# Patient Record
Sex: Male | Born: 1935
Health system: Southern US, Community
[De-identification: ages and names within clinical notes are randomized; demographics above are authoritative.]

## PROBLEM LIST (undated history)

## (undated) ENCOUNTER — Emergency Department: Admission: EM | Payer: Medicare Other | Source: Home / Self Care

## (undated) DIAGNOSIS — G4700 Insomnia, unspecified: Secondary | ICD-10-CM

## (undated) DIAGNOSIS — M1990 Unspecified osteoarthritis, unspecified site: Secondary | ICD-10-CM

## (undated) DIAGNOSIS — G992 Myelopathy in diseases classified elsewhere: Secondary | ICD-10-CM

## (undated) DIAGNOSIS — J189 Pneumonia, unspecified organism: Secondary | ICD-10-CM

## (undated) DIAGNOSIS — R002 Palpitations: Secondary | ICD-10-CM

## (undated) DIAGNOSIS — T4145XA Adverse effect of unspecified anesthetic, initial encounter: Secondary | ICD-10-CM

## (undated) DIAGNOSIS — R202 Paresthesia of skin: Secondary | ICD-10-CM

## (undated) DIAGNOSIS — Z951 Presence of aortocoronary bypass graft: Secondary | ICD-10-CM

## (undated) DIAGNOSIS — T8859XA Other complications of anesthesia, initial encounter: Secondary | ICD-10-CM

## (undated) DIAGNOSIS — Z9861 Coronary angioplasty status: Secondary | ICD-10-CM

## (undated) DIAGNOSIS — R222 Localized swelling, mass and lump, trunk: Secondary | ICD-10-CM

## (undated) DIAGNOSIS — I25810 Atherosclerosis of coronary artery bypass graft(s) without angina pectoris: Secondary | ICD-10-CM

## (undated) DIAGNOSIS — M4802 Spinal stenosis, cervical region: Secondary | ICD-10-CM

## (undated) DIAGNOSIS — I10 Essential (primary) hypertension: Secondary | ICD-10-CM

## (undated) DIAGNOSIS — I2510 Atherosclerotic heart disease of native coronary artery without angina pectoris: Secondary | ICD-10-CM

## (undated) DIAGNOSIS — Z87442 Personal history of urinary calculi: Secondary | ICD-10-CM

## (undated) DIAGNOSIS — I499 Cardiac arrhythmia, unspecified: Secondary | ICD-10-CM

## (undated) DIAGNOSIS — T884XXA Failed or difficult intubation, initial encounter: Secondary | ICD-10-CM

## (undated) DIAGNOSIS — E785 Hyperlipidemia, unspecified: Secondary | ICD-10-CM

## (undated) HISTORY — DX: Atherosclerosis of coronary artery bypass graft(s) without angina pectoris: I25.810

## (undated) HISTORY — PX: CORONARY ANGIOPLASTY WITH STENT PLACEMENT: SHX49

## (undated) HISTORY — PX: BACK SURGERY: SHX140

## (undated) HISTORY — PX: CORONARY ARTERY BYPASS GRAFT: SHX141

## (undated) HISTORY — DX: Palpitations: R00.2

## (undated) HISTORY — PX: COLONOSCOPY: SHX5424

## (undated) HISTORY — PX: KNEE ARTHROSCOPY: SUR90

## (undated) HISTORY — PX: NM MYOVIEW LTD: HXRAD82

## (undated) HISTORY — DX: Insomnia, unspecified: G47.00

## (undated) HISTORY — DX: Atherosclerotic heart disease of native coronary artery without angina pectoris: I25.10

## (undated) HISTORY — DX: Essential (primary) hypertension: I10

## (undated) HISTORY — PX: KIDNEY STONE SURGERY: SHX686

## (undated) HISTORY — DX: Hyperlipidemia, unspecified: E78.5

## (undated) HISTORY — PX: EYE SURGERY: SHX253

## (undated) HISTORY — DX: Coronary angioplasty status: Z98.61

## (undated) HISTORY — DX: Presence of aortocoronary bypass graft: Z95.1

---

## 1995-06-08 DIAGNOSIS — Z951 Presence of aortocoronary bypass graft: Secondary | ICD-10-CM

## 1995-06-08 HISTORY — DX: Presence of aortocoronary bypass graft: Z95.1

## 1995-07-27 DIAGNOSIS — I25118 Atherosclerotic heart disease of native coronary artery with other forms of angina pectoris: Secondary | ICD-10-CM | POA: Insufficient documentation

## 1998-05-04 ENCOUNTER — Emergency Department (HOSPITAL_COMMUNITY): Admission: EM | Admit: 1998-05-04 | Discharge: 1998-05-04 | Payer: Self-pay | Admitting: Emergency Medicine

## 1999-04-02 ENCOUNTER — Encounter: Payer: Self-pay | Admitting: Gastroenterology

## 1999-04-02 ENCOUNTER — Ambulatory Visit (HOSPITAL_COMMUNITY): Admission: RE | Admit: 1999-04-02 | Discharge: 1999-04-02 | Payer: Self-pay | Admitting: Gastroenterology

## 2000-07-11 ENCOUNTER — Ambulatory Visit (HOSPITAL_COMMUNITY): Admission: RE | Admit: 2000-07-11 | Discharge: 2000-07-11 | Payer: Self-pay | Admitting: Gastroenterology

## 2000-08-04 ENCOUNTER — Ambulatory Visit (HOSPITAL_COMMUNITY): Admission: RE | Admit: 2000-08-04 | Discharge: 2000-08-04 | Payer: Self-pay | Admitting: Gastroenterology

## 2000-08-04 ENCOUNTER — Encounter: Payer: Self-pay | Admitting: Gastroenterology

## 2001-07-25 ENCOUNTER — Ambulatory Visit (HOSPITAL_COMMUNITY): Admission: RE | Admit: 2001-07-25 | Discharge: 2001-07-25 | Payer: Self-pay | Admitting: Oncology

## 2001-07-25 ENCOUNTER — Encounter (HOSPITAL_COMMUNITY): Payer: Self-pay | Admitting: Oncology

## 2002-03-08 ENCOUNTER — Encounter: Payer: Self-pay | Admitting: Internal Medicine

## 2002-03-08 ENCOUNTER — Ambulatory Visit (HOSPITAL_COMMUNITY): Admission: RE | Admit: 2002-03-08 | Discharge: 2002-03-08 | Payer: Self-pay

## 2004-04-12 ENCOUNTER — Ambulatory Visit: Payer: Self-pay | Admitting: Oncology

## 2004-06-20 ENCOUNTER — Encounter: Admission: RE | Admit: 2004-06-20 | Discharge: 2004-06-20 | Payer: Self-pay | Admitting: Rheumatology

## 2004-06-22 ENCOUNTER — Ambulatory Visit (HOSPITAL_COMMUNITY): Admission: RE | Admit: 2004-06-22 | Discharge: 2004-06-22 | Payer: Self-pay | Admitting: Rheumatology

## 2004-07-01 ENCOUNTER — Encounter: Admission: RE | Admit: 2004-07-01 | Discharge: 2004-07-01 | Payer: Self-pay | Admitting: Gastroenterology

## 2004-07-09 ENCOUNTER — Encounter (INDEPENDENT_AMBULATORY_CARE_PROVIDER_SITE_OTHER): Payer: Self-pay | Admitting: Specialist

## 2004-07-09 ENCOUNTER — Ambulatory Visit (HOSPITAL_COMMUNITY): Admission: RE | Admit: 2004-07-09 | Discharge: 2004-07-09 | Payer: Self-pay | Admitting: Gastroenterology

## 2004-08-24 ENCOUNTER — Encounter: Admission: RE | Admit: 2004-08-24 | Discharge: 2004-08-24 | Payer: Self-pay | Admitting: Gastroenterology

## 2004-09-14 ENCOUNTER — Encounter: Admission: RE | Admit: 2004-09-14 | Discharge: 2004-09-14 | Payer: Self-pay | Admitting: Gastroenterology

## 2004-11-04 ENCOUNTER — Ambulatory Visit (HOSPITAL_COMMUNITY): Admission: RE | Admit: 2004-11-04 | Discharge: 2004-11-05 | Payer: Self-pay | Admitting: Cardiology

## 2005-07-08 ENCOUNTER — Ambulatory Visit: Payer: Self-pay | Admitting: Oncology

## 2005-10-20 ENCOUNTER — Ambulatory Visit (HOSPITAL_COMMUNITY): Admission: RE | Admit: 2005-10-20 | Discharge: 2005-10-20 | Payer: Self-pay | Admitting: Specialist

## 2006-01-05 ENCOUNTER — Ambulatory Visit: Payer: Self-pay | Admitting: Oncology

## 2006-01-07 LAB — IGG, IGA, IGM
IgA: 161 mg/dL (ref 68–378)
IgG (Immunoglobin G), Serum: 1330 mg/dL (ref 694–1618)
IgM, Serum: 93 mg/dL (ref 60–263)

## 2006-02-11 ENCOUNTER — Emergency Department (HOSPITAL_COMMUNITY): Admission: EM | Admit: 2006-02-11 | Discharge: 2006-02-11 | Payer: Self-pay | Admitting: Emergency Medicine

## 2006-02-28 ENCOUNTER — Emergency Department (HOSPITAL_COMMUNITY): Admission: EM | Admit: 2006-02-28 | Discharge: 2006-03-01 | Payer: Self-pay | Admitting: Emergency Medicine

## 2006-03-02 ENCOUNTER — Ambulatory Visit (HOSPITAL_BASED_OUTPATIENT_CLINIC_OR_DEPARTMENT_OTHER): Admission: RE | Admit: 2006-03-02 | Discharge: 2006-03-02 | Payer: Self-pay | Admitting: Urology

## 2006-03-04 ENCOUNTER — Emergency Department (HOSPITAL_COMMUNITY): Admission: EM | Admit: 2006-03-04 | Discharge: 2006-03-05 | Payer: Self-pay | Admitting: *Deleted

## 2006-07-05 ENCOUNTER — Ambulatory Visit: Payer: Self-pay | Admitting: Oncology

## 2006-07-08 LAB — CBC WITH DIFFERENTIAL/PLATELET
BASO%: 0.3 % (ref 0.0–2.0)
Basophils Absolute: 0 10*3/uL (ref 0.0–0.1)
EOS%: 0 % (ref 0.0–7.0)
Eosinophils Absolute: 0 10*3/uL (ref 0.0–0.5)
HCT: 44.5 % (ref 38.7–49.9)
HGB: 15.4 g/dL (ref 13.0–17.1)
LYMPH%: 22 % (ref 14.0–48.0)
MCH: 29.3 pg (ref 28.0–33.4)
MCHC: 34.6 g/dL (ref 32.0–35.9)
MCV: 84.7 fL (ref 81.6–98.0)
MONO#: 0.5 10*3/uL (ref 0.1–0.9)
MONO%: 5.5 % (ref 0.0–13.0)
NEUT#: 7.2 10*3/uL — ABNORMAL HIGH (ref 1.5–6.5)
NEUT%: 72.2 % (ref 40.0–75.0)
Platelets: 294 10*3/uL (ref 145–400)
RBC: 5.25 10*6/uL (ref 4.20–5.71)
RDW: 13.5 % (ref 11.2–14.6)
WBC: 10 10*3/uL (ref 4.0–10.0)
lymph#: 2.2 10*3/uL (ref 0.9–3.3)

## 2006-07-08 LAB — COMPREHENSIVE METABOLIC PANEL
ALT: 11 U/L (ref 0–53)
AST: 13 U/L (ref 0–37)
Albumin: 4.2 g/dL (ref 3.5–5.2)
Alkaline Phosphatase: 99 U/L (ref 39–117)
BUN: 20 mg/dL (ref 6–23)
CO2: 28 mEq/L (ref 19–32)
Calcium: 9.9 mg/dL (ref 8.4–10.5)
Chloride: 106 mEq/L (ref 96–112)
Creatinine, Ser: 0.9 mg/dL (ref 0.40–1.50)
Glucose, Bld: 119 mg/dL — ABNORMAL HIGH (ref 70–99)
Potassium: 3.9 mEq/L (ref 3.5–5.3)
Sodium: 144 mEq/L (ref 135–145)
Total Bilirubin: 0.6 mg/dL (ref 0.3–1.2)
Total Protein: 7.3 g/dL (ref 6.0–8.3)

## 2006-07-08 LAB — IGG, IGA, IGM
IgA: 164 mg/dL (ref 68–378)
IgG (Immunoglobin G), Serum: 1470 mg/dL (ref 694–1618)
IgM, Serum: 92 mg/dL (ref 60–263)

## 2006-07-08 LAB — LACTATE DEHYDROGENASE: LDH: 124 U/L (ref 94–250)

## 2006-09-28 ENCOUNTER — Encounter: Admission: RE | Admit: 2006-09-28 | Discharge: 2006-09-28 | Payer: Self-pay | Admitting: Specialist

## 2006-10-19 ENCOUNTER — Encounter: Admission: RE | Admit: 2006-10-19 | Discharge: 2006-10-19 | Payer: Self-pay | Admitting: Specialist

## 2006-11-06 DIAGNOSIS — I2510 Atherosclerotic heart disease of native coronary artery without angina pectoris: Secondary | ICD-10-CM

## 2006-11-06 HISTORY — PX: CARDIAC CATHETERIZATION: SHX172

## 2006-11-06 HISTORY — PX: LEFT HEART CATH AND CORS/GRAFTS ANGIOGRAPHY: CATH118250

## 2006-11-06 HISTORY — DX: Atherosclerotic heart disease of native coronary artery without angina pectoris: I25.10

## 2006-11-06 HISTORY — PX: CORONARY ANGIOPLASTY WITH STENT PLACEMENT: SHX49

## 2006-11-10 ENCOUNTER — Encounter: Admission: RE | Admit: 2006-11-10 | Discharge: 2006-11-10 | Payer: Self-pay | Admitting: Specialist

## 2006-11-12 ENCOUNTER — Emergency Department (HOSPITAL_COMMUNITY): Admission: EM | Admit: 2006-11-12 | Discharge: 2006-11-12 | Payer: Self-pay | Admitting: Emergency Medicine

## 2006-11-13 ENCOUNTER — Inpatient Hospital Stay (HOSPITAL_COMMUNITY): Admission: EM | Admit: 2006-11-13 | Discharge: 2006-11-15 | Payer: Self-pay | Admitting: Emergency Medicine

## 2007-01-09 ENCOUNTER — Encounter: Admission: RE | Admit: 2007-01-09 | Discharge: 2007-01-09 | Payer: Self-pay | Admitting: Specialist

## 2007-04-05 ENCOUNTER — Encounter: Admission: RE | Admit: 2007-04-05 | Discharge: 2007-04-05 | Payer: Self-pay | Admitting: Specialist

## 2007-04-20 ENCOUNTER — Encounter: Admission: RE | Admit: 2007-04-20 | Discharge: 2007-04-20 | Payer: Self-pay | Admitting: Specialist

## 2007-07-05 ENCOUNTER — Ambulatory Visit: Payer: Self-pay | Admitting: Oncology

## 2007-07-07 LAB — CBC WITH DIFFERENTIAL/PLATELET
BASO%: 0.6 % (ref 0.0–2.0)
Basophils Absolute: 0 10*3/uL (ref 0.0–0.1)
EOS%: 1.3 % (ref 0.0–7.0)
Eosinophils Absolute: 0.1 10*3/uL (ref 0.0–0.5)
HCT: 41.9 % (ref 38.7–49.9)
HGB: 14.6 g/dL (ref 13.0–17.1)
LYMPH%: 30.8 % (ref 14.0–48.0)
MCH: 30.3 pg (ref 28.0–33.4)
MCHC: 35 g/dL (ref 32.0–35.9)
MCV: 86.5 fL (ref 81.6–98.0)
MONO#: 0.5 10*3/uL (ref 0.1–0.9)
MONO%: 8.7 % (ref 0.0–13.0)
NEUT#: 3.2 10*3/uL (ref 1.5–6.5)
NEUT%: 58.6 % (ref 40.0–75.0)
Platelets: 251 10*3/uL (ref 145–400)
RBC: 4.84 10*6/uL (ref 4.20–5.71)
RDW: 14 % (ref 11.2–14.6)
WBC: 5.4 10*3/uL (ref 4.0–10.0)
lymph#: 1.7 10*3/uL (ref 0.9–3.3)

## 2007-07-10 ENCOUNTER — Encounter: Admission: RE | Admit: 2007-07-10 | Discharge: 2007-07-10 | Payer: Self-pay | Admitting: Specialist

## 2007-07-10 LAB — COMPREHENSIVE METABOLIC PANEL
ALT: 12 U/L (ref 0–53)
AST: 15 U/L (ref 0–37)
Albumin: 4.1 g/dL (ref 3.5–5.2)
Alkaline Phosphatase: 79 U/L (ref 39–117)
BUN: 20 mg/dL (ref 6–23)
CO2: 27 mEq/L (ref 19–32)
Calcium: 9.4 mg/dL (ref 8.4–10.5)
Chloride: 106 mEq/L (ref 96–112)
Creatinine, Ser: 0.87 mg/dL (ref 0.40–1.50)
Glucose, Bld: 107 mg/dL — ABNORMAL HIGH (ref 70–99)
Potassium: 4.5 mEq/L (ref 3.5–5.3)
Sodium: 143 mEq/L (ref 135–145)
Total Bilirubin: 0.9 mg/dL (ref 0.3–1.2)
Total Protein: 6.9 g/dL (ref 6.0–8.3)

## 2007-07-10 LAB — IGG, IGA, IGM
IgA: 117 mg/dL (ref 68–378)
IgG (Immunoglobin G), Serum: 1170 mg/dL (ref 694–1618)
IgM, Serum: 84 mg/dL (ref 60–263)

## 2007-07-10 LAB — LACTATE DEHYDROGENASE: LDH: 126 U/L (ref 94–250)

## 2007-07-10 LAB — BETA 2 MICROGLOBULIN, SERUM: Beta-2 Microglobulin: 2.14 mg/L — ABNORMAL HIGH (ref 1.01–1.73)

## 2007-12-21 ENCOUNTER — Encounter (INDEPENDENT_AMBULATORY_CARE_PROVIDER_SITE_OTHER): Payer: Self-pay | Admitting: Interventional Radiology

## 2007-12-21 ENCOUNTER — Other Ambulatory Visit: Admission: RE | Admit: 2007-12-21 | Discharge: 2007-12-21 | Payer: Self-pay | Admitting: Interventional Radiology

## 2007-12-21 ENCOUNTER — Encounter: Admission: RE | Admit: 2007-12-21 | Discharge: 2007-12-21 | Payer: Self-pay | Admitting: Internal Medicine

## 2008-06-07 DIAGNOSIS — I25810 Atherosclerosis of coronary artery bypass graft(s) without angina pectoris: Secondary | ICD-10-CM

## 2008-06-07 HISTORY — PX: LEFT HEART CATH AND CORS/GRAFTS ANGIOGRAPHY: CATH118250

## 2008-06-07 HISTORY — PX: CARDIAC CATHETERIZATION: SHX172

## 2008-06-07 HISTORY — DX: Atherosclerosis of coronary artery bypass graft(s) without angina pectoris: I25.810

## 2008-07-05 ENCOUNTER — Ambulatory Visit: Payer: Self-pay | Admitting: Oncology

## 2008-07-09 LAB — COMPREHENSIVE METABOLIC PANEL
ALT: 13 U/L (ref 0–53)
AST: 12 U/L (ref 0–37)
Albumin: 3.8 g/dL (ref 3.5–5.2)
Alkaline Phosphatase: 74 U/L (ref 39–117)
BUN: 22 mg/dL (ref 6–23)
CO2: 29 mEq/L (ref 19–32)
Calcium: 9.5 mg/dL (ref 8.4–10.5)
Chloride: 104 mEq/L (ref 96–112)
Creatinine, Ser: 0.88 mg/dL (ref 0.40–1.50)
Glucose, Bld: 100 mg/dL — ABNORMAL HIGH (ref 70–99)
Potassium: 4.4 mEq/L (ref 3.5–5.3)
Sodium: 141 mEq/L (ref 135–145)
Total Bilirubin: 0.7 mg/dL (ref 0.3–1.2)
Total Protein: 6.7 g/dL (ref 6.0–8.3)

## 2008-07-09 LAB — CBC WITH DIFFERENTIAL/PLATELET
BASO%: 0.4 % (ref 0.0–2.0)
Basophils Absolute: 0 10*3/uL (ref 0.0–0.1)
EOS%: 0.8 % (ref 0.0–7.0)
Eosinophils Absolute: 0.1 10*3/uL (ref 0.0–0.5)
HCT: 44.3 % (ref 38.7–49.9)
HGB: 15.1 g/dL (ref 13.0–17.1)
LYMPH%: 30.1 % (ref 14.0–48.0)
MCH: 29.7 pg (ref 28.0–33.4)
MCHC: 34.1 g/dL (ref 32.0–35.9)
MCV: 87.2 fL (ref 81.6–98.0)
MONO#: 0.6 10*3/uL (ref 0.1–0.9)
MONO%: 8.8 % (ref 0.0–13.0)
NEUT#: 3.9 10*3/uL (ref 1.5–6.5)
NEUT%: 59.9 % (ref 40.0–75.0)
Platelets: 235 10*3/uL (ref 145–400)
RBC: 5.08 10*6/uL (ref 4.20–5.71)
RDW: 14.4 % (ref 11.2–14.6)
WBC: 6.5 10*3/uL (ref 4.0–10.0)
lymph#: 2 10*3/uL (ref 0.9–3.3)

## 2008-07-09 LAB — BETA 2 MICROGLOBULIN, SERUM: Beta-2 Microglobulin: 2.22 mg/L — ABNORMAL HIGH (ref 1.01–1.73)

## 2008-07-09 LAB — IGG, IGA, IGM
IgA: 153 mg/dL (ref 68–378)
IgG (Immunoglobin G), Serum: 1150 mg/dL (ref 694–1618)
IgM, Serum: 97 mg/dL (ref 60–263)

## 2008-07-09 LAB — LACTATE DEHYDROGENASE: LDH: 143 U/L (ref 94–250)

## 2008-10-02 ENCOUNTER — Encounter: Admission: RE | Admit: 2008-10-02 | Discharge: 2008-10-02 | Payer: Self-pay | Admitting: Specialist

## 2008-10-24 ENCOUNTER — Ambulatory Visit (HOSPITAL_COMMUNITY): Admission: RE | Admit: 2008-10-24 | Discharge: 2008-10-25 | Payer: Self-pay | Admitting: Specialist

## 2008-11-12 ENCOUNTER — Inpatient Hospital Stay (HOSPITAL_COMMUNITY): Admission: EM | Admit: 2008-11-12 | Discharge: 2008-11-13 | Payer: Self-pay | Admitting: Emergency Medicine

## 2009-07-09 ENCOUNTER — Ambulatory Visit: Payer: Self-pay | Admitting: Oncology

## 2009-07-09 LAB — CBC WITH DIFFERENTIAL/PLATELET
BASO%: 0.3 % (ref 0.0–2.0)
Basophils Absolute: 0 10*3/uL (ref 0.0–0.1)
EOS%: 0.8 % (ref 0.0–7.0)
Eosinophils Absolute: 0 10*3/uL (ref 0.0–0.5)
HCT: 41.1 % (ref 38.4–49.9)
HGB: 13.4 g/dL (ref 13.0–17.1)
LYMPH%: 28.2 % (ref 14.0–49.0)
MCH: 27.6 pg (ref 27.2–33.4)
MCHC: 32.6 g/dL (ref 32.0–36.0)
MCV: 84.6 fL (ref 79.3–98.0)
MONO#: 0.5 10*3/uL (ref 0.1–0.9)
MONO%: 7.9 % (ref 0.0–14.0)
NEUT#: 3.7 10*3/uL (ref 1.5–6.5)
NEUT%: 62.8 % (ref 39.0–75.0)
Platelets: 229 10*3/uL (ref 140–400)
RBC: 4.86 10*6/uL (ref 4.20–5.82)
RDW: 14.4 % (ref 11.0–14.6)
WBC: 5.8 10*3/uL (ref 4.0–10.3)
lymph#: 1.6 10*3/uL (ref 0.9–3.3)

## 2009-07-09 LAB — COMPREHENSIVE METABOLIC PANEL
ALT: 11 U/L (ref 0–53)
AST: 16 U/L (ref 0–37)
Albumin: 4 g/dL (ref 3.5–5.2)
Alkaline Phosphatase: 96 U/L (ref 39–117)
BUN: 16 mg/dL (ref 6–23)
CO2: 26 mEq/L (ref 19–32)
Calcium: 9.3 mg/dL (ref 8.4–10.5)
Chloride: 104 mEq/L (ref 96–112)
Creatinine, Ser: 0.81 mg/dL (ref 0.40–1.50)
Glucose, Bld: 116 mg/dL — ABNORMAL HIGH (ref 70–99)
Potassium: 4.5 mEq/L (ref 3.5–5.3)
Sodium: 140 mEq/L (ref 135–145)
Total Bilirubin: 1 mg/dL (ref 0.3–1.2)
Total Protein: 6.8 g/dL (ref 6.0–8.3)

## 2009-07-09 LAB — IGG, IGA, IGM
IgA: 120 mg/dL (ref 68–378)
IgG (Immunoglobin G), Serum: 1220 mg/dL (ref 694–1618)
IgM, Serum: 88 mg/dL (ref 60–263)

## 2009-07-09 LAB — LACTATE DEHYDROGENASE: LDH: 131 U/L (ref 94–250)

## 2010-06-28 ENCOUNTER — Encounter: Payer: Self-pay | Admitting: Specialist

## 2010-09-14 LAB — CARDIAC PANEL(CRET KIN+CKTOT+MB+TROPI)
CK, MB: 1.4 ng/mL (ref 0.3–4.0)
CK, MB: 1.9 ng/mL (ref 0.3–4.0)
CK, MB: 2.2 ng/mL (ref 0.3–4.0)
Relative Index: INVALID (ref 0.0–2.5)
Relative Index: INVALID (ref 0.0–2.5)
Relative Index: INVALID (ref 0.0–2.5)
Total CK: 41 U/L (ref 7–232)
Total CK: 48 U/L (ref 7–232)
Total CK: 60 U/L (ref 7–232)
Troponin I: 0.01 ng/mL (ref 0.00–0.06)
Troponin I: 0.01 ng/mL (ref 0.00–0.06)
Troponin I: <0.01 ng/mL (ref 0.00–0.06)

## 2010-09-14 LAB — URINALYSIS, ROUTINE W REFLEX MICROSCOPIC
Bilirubin Urine: NEGATIVE
Glucose, UA: NEGATIVE mg/dL
Hgb urine dipstick: NEGATIVE
Ketones, ur: NEGATIVE mg/dL
Nitrite: NEGATIVE
Protein, ur: NEGATIVE mg/dL
Specific Gravity, Urine: 1.015 (ref 1.005–1.030)
Urobilinogen, UA: 0.2 mg/dL (ref 0.0–1.0)
pH: 7.5 (ref 5.0–8.0)

## 2010-09-14 LAB — CBC
HCT: 40.1 % (ref 39.0–52.0)
HCT: 40.2 % (ref 39.0–52.0)
HCT: 40.8 % (ref 39.0–52.0)
Hemoglobin: 13.7 g/dL (ref 13.0–17.0)
Hemoglobin: 13.7 g/dL (ref 13.0–17.0)
Hemoglobin: 14.1 g/dL (ref 13.0–17.0)
MCHC: 34 g/dL (ref 30.0–36.0)
MCHC: 34.2 g/dL (ref 30.0–36.0)
MCHC: 34.6 g/dL (ref 30.0–36.0)
MCV: 87.1 fL (ref 78.0–100.0)
MCV: 87.7 fL (ref 78.0–100.0)
MCV: 87.8 fL (ref 78.0–100.0)
Platelets: 273 10*3/uL (ref 150–400)
Platelets: 284 10*3/uL (ref 150–400)
Platelets: 289 10*3/uL (ref 150–400)
RBC: 4.57 MIL/uL (ref 4.22–5.81)
RBC: 4.61 MIL/uL (ref 4.22–5.81)
RBC: 4.65 MIL/uL (ref 4.22–5.81)
RDW: 13.3 % (ref 11.5–15.5)
RDW: 13.4 % (ref 11.5–15.5)
RDW: 13.4 % (ref 11.5–15.5)
WBC: 5.7 10*3/uL (ref 4.0–10.5)
WBC: 6 10*3/uL (ref 4.0–10.5)
WBC: 6.1 10*3/uL (ref 4.0–10.5)

## 2010-09-14 LAB — LIPID PANEL
Cholesterol: 207 mg/dL — ABNORMAL HIGH (ref 0–200)
HDL: 33 mg/dL — ABNORMAL LOW (ref >39)
LDL Cholesterol: 151 mg/dL — ABNORMAL HIGH (ref 0–99)
Total CHOL/HDL Ratio: 6.3 RATIO
Triglycerides: 117 mg/dL (ref <150)
VLDL: 23 mg/dL (ref 0–40)

## 2010-09-14 LAB — COMPREHENSIVE METABOLIC PANEL
ALT: 17 U/L (ref 0–53)
ALT: 18 U/L (ref 0–53)
AST: 18 U/L (ref 0–37)
AST: 20 U/L (ref 0–37)
Albumin: 3.3 g/dL — ABNORMAL LOW (ref 3.5–5.2)
Albumin: 3.5 g/dL (ref 3.5–5.2)
Alkaline Phosphatase: 73 U/L (ref 39–117)
Alkaline Phosphatase: 82 U/L (ref 39–117)
BUN: 21 mg/dL (ref 6–23)
BUN: 21 mg/dL (ref 6–23)
CO2: 26 mEq/L (ref 19–32)
CO2: 29 mEq/L (ref 19–32)
Calcium: 9.2 mg/dL (ref 8.4–10.5)
Calcium: 9.3 mg/dL (ref 8.4–10.5)
Chloride: 106 mEq/L (ref 96–112)
Chloride: 107 mEq/L (ref 96–112)
Creatinine, Ser: 0.87 mg/dL (ref 0.4–1.5)
Creatinine, Ser: 0.93 mg/dL (ref 0.4–1.5)
GFR calc Af Amer: >60 mL/min (ref >60)
GFR calc Af Amer: >60 mL/min (ref >60)
GFR calc non Af Amer: >60 mL/min (ref >60)
GFR calc non Af Amer: >60 mL/min (ref >60)
Glucose, Bld: 122 mg/dL — ABNORMAL HIGH (ref 70–99)
Glucose, Bld: 163 mg/dL — ABNORMAL HIGH (ref 70–99)
Potassium: 4.3 mEq/L (ref 3.5–5.1)
Potassium: 4.5 mEq/L (ref 3.5–5.1)
Sodium: 140 mEq/L (ref 135–145)
Sodium: 140 mEq/L (ref 135–145)
Total Bilirubin: 0.5 mg/dL (ref 0.3–1.2)
Total Bilirubin: 0.8 mg/dL (ref 0.3–1.2)
Total Protein: 6.4 g/dL (ref 6.0–8.3)
Total Protein: 6.8 g/dL (ref 6.0–8.3)

## 2010-09-14 LAB — POCT CARDIAC MARKERS
CKMB, poc: 1.4 ng/mL (ref 1.0–8.0)
CKMB, poc: 1.5 ng/mL (ref 1.0–8.0)
Myoglobin, poc: 80.6 ng/mL (ref 12–200)
Myoglobin, poc: 96.1 ng/mL (ref 12–200)
Troponin i, poc: <0.05 ng/mL (ref 0.00–0.09)
Troponin i, poc: <0.05 ng/mL (ref 0.00–0.09)

## 2010-09-14 LAB — HEMOGLOBIN A1C
Hgb A1c MFr Bld: 5.9 % (ref 4.6–6.1)
Mean Plasma Glucose: 123 mg/dL

## 2010-09-14 LAB — DIFFERENTIAL
Basophils Absolute: 0 10*3/uL (ref 0.0–0.1)
Basophils Absolute: 0 10*3/uL (ref 0.0–0.1)
Basophils Relative: 0 % (ref 0–1)
Basophils Relative: 0 % (ref 0–1)
Eosinophils Absolute: 0.1 10*3/uL (ref 0.0–0.7)
Eosinophils Absolute: 0.1 10*3/uL (ref 0.0–0.7)
Eosinophils Relative: 1 % (ref 0–5)
Eosinophils Relative: 2 % (ref 0–5)
Lymphocytes Relative: 16 % (ref 12–46)
Lymphocytes Relative: 21 % (ref 12–46)
Lymphs Abs: 1 10*3/uL (ref 0.7–4.0)
Lymphs Abs: 1.2 10*3/uL (ref 0.7–4.0)
Monocytes Absolute: 0.4 10*3/uL (ref 0.1–1.0)
Monocytes Absolute: 0.5 10*3/uL (ref 0.1–1.0)
Monocytes Relative: 8 % (ref 3–12)
Monocytes Relative: 8 % (ref 3–12)
Neutro Abs: 4 10*3/uL (ref 1.7–7.7)
Neutro Abs: 4.5 10*3/uL (ref 1.7–7.7)
Neutrophils Relative %: 70 % (ref 43–77)
Neutrophils Relative %: 75 % (ref 43–77)

## 2010-09-14 LAB — BRAIN NATRIURETIC PEPTIDE
Pro B Natriuretic peptide (BNP): 59 pg/mL (ref 0.0–100.0)
Pro B Natriuretic peptide (BNP): 71 pg/mL (ref 0.0–100.0)

## 2010-09-14 LAB — HEPARIN LEVEL (UNFRACTIONATED): Heparin Unfractionated: 0.7 IU/mL (ref 0.30–0.70)

## 2010-09-14 LAB — PROTIME-INR
INR: 1.1 (ref 0.00–1.49)
INR: 1.1 (ref 0.00–1.49)
Prothrombin Time: 14.1 seconds (ref 11.6–15.2)
Prothrombin Time: 14.4 seconds (ref 11.6–15.2)

## 2010-09-14 LAB — T4: T4, Total: 7 ug/dL (ref 5.0–12.5)

## 2010-09-14 LAB — APTT
aPTT: 114 seconds — ABNORMAL HIGH (ref 24–37)
aPTT: 29 seconds (ref 24–37)

## 2010-09-14 LAB — T3: T3, Total: 109.8 ng/dl (ref 80.0–204.0)

## 2010-09-14 LAB — MAGNESIUM: Magnesium: 2.2 mg/dL (ref 1.5–2.5)

## 2010-09-14 LAB — TSH
TSH: 0.033 u[IU]/mL — ABNORMAL LOW (ref 0.350–4.500)
TSH: 0.039 u[IU]/mL — ABNORMAL LOW (ref 0.350–4.500)
TSH: 0.055 u[IU]/mL — ABNORMAL LOW (ref 0.350–4.500)

## 2010-09-15 LAB — URINALYSIS, ROUTINE W REFLEX MICROSCOPIC
Bilirubin Urine: NEGATIVE
Glucose, UA: NEGATIVE mg/dL
Hgb urine dipstick: NEGATIVE
Ketones, ur: NEGATIVE mg/dL
Nitrite: NEGATIVE
Protein, ur: NEGATIVE mg/dL
Specific Gravity, Urine: 1.034 — ABNORMAL HIGH (ref 1.005–1.030)
Urobilinogen, UA: 0.2 mg/dL (ref 0.0–1.0)
pH: 5 (ref 5.0–8.0)

## 2010-09-15 LAB — DIFFERENTIAL
Basophils Absolute: 0 10*3/uL (ref 0.0–0.1)
Basophils Relative: 0 % (ref 0–1)
Eosinophils Absolute: 0 10*3/uL (ref 0.0–0.7)
Eosinophils Relative: 0 % (ref 0–5)
Lymphocytes Relative: 23 % (ref 12–46)
Lymphs Abs: 1.7 10*3/uL (ref 0.7–4.0)
Monocytes Absolute: 0.7 10*3/uL (ref 0.1–1.0)
Monocytes Relative: 10 % (ref 3–12)
Neutro Abs: 4.9 10*3/uL (ref 1.7–7.7)
Neutrophils Relative %: 67 % (ref 43–77)

## 2010-09-15 LAB — PROTIME-INR
INR: 1 (ref 0.00–1.49)
Prothrombin Time: 13.7 seconds (ref 11.6–15.2)

## 2010-09-15 LAB — COMPREHENSIVE METABOLIC PANEL
ALT: 17 U/L (ref 0–53)
AST: 21 U/L (ref 0–37)
Albumin: 3.8 g/dL (ref 3.5–5.2)
Alkaline Phosphatase: 70 U/L (ref 39–117)
BUN: 21 mg/dL (ref 6–23)
CO2: 29 mEq/L (ref 19–32)
Calcium: 9.4 mg/dL (ref 8.4–10.5)
Chloride: 106 mEq/L (ref 96–112)
Creatinine, Ser: 0.81 mg/dL (ref 0.4–1.5)
GFR calc Af Amer: >60 mL/min (ref >60)
GFR calc non Af Amer: >60 mL/min (ref >60)
Glucose, Bld: 105 mg/dL — ABNORMAL HIGH (ref 70–99)
Potassium: 4 mEq/L (ref 3.5–5.1)
Sodium: 140 mEq/L (ref 135–145)
Total Bilirubin: 0.8 mg/dL (ref 0.3–1.2)
Total Protein: 7.3 g/dL (ref 6.0–8.3)

## 2010-09-15 LAB — CBC
HCT: 44 % (ref 39.0–52.0)
Hemoglobin: 15.2 g/dL (ref 13.0–17.0)
MCHC: 34.5 g/dL (ref 30.0–36.0)
MCV: 88.3 fL (ref 78.0–100.0)
Platelets: 246 10*3/uL (ref 150–400)
RBC: 4.98 MIL/uL (ref 4.22–5.81)
RDW: 14 % (ref 11.5–15.5)
WBC: 7.3 10*3/uL (ref 4.0–10.5)

## 2010-09-15 LAB — APTT: aPTT: 29 seconds (ref 24–37)

## 2010-10-20 NOTE — Discharge Summary (Signed)
NAME:  Daniel [name], Daniel [name] NO.:  [id number]   MEDICAL RECORD NO.:  [id number]          PATIENT TYPE:  INP   LOCATION:  3734                         FACILITY:  [facility]   PHYSICIAN:  Thereasa Solo. Little, M.D. DATE OF BIRTH:  1932/12/18   DATE OF ADMISSION:  11/12/2008  DATE OF DISCHARGE:  11/13/2008                               DISCHARGE SUMMARY   DISCHARGE DIAGNOSES:  1. Chest pain consistent with unstable angina, catheterization this      admission revealing patent graft to the obtuse marginal and patent      vein graft to the diagonal and patent left internal mammary artery      to the left anterior descending with no significant disease in the      right coronary artery with a native percutaneous coronary      intervention site and the posterior descending artery, ejection      fraction 55%.  2. Coronary artery bypass grafting in 1997 with left internal mammary      artery to the left anterior descending, saphenous vein graft to the      diagonal, saphenous vein graft to the obtuse marginal, and      percutaneous transluminal coronary angioplasty to the posterior      descending artery in June 2008.  3. Hypertension.  4. Dyslipidemia, the patient stopped Lipitor because of myalgia and      fatigue.  5. Ankylosing spondylitis.  6. History of ulcerative colitis.  7. History of monoclonal gammopathy.   HOSPITAL COURSE:  The patient is a 77 year old male followed by Dr.  Clarene Duke and Dr. Jacky Kindle with coronary artery disease as described above.  He had bypass in 1997 and underwent a PDA intervention in June 2008.  He  called the office on November 12, 2008, complaining of some chest pain.  Symptoms were somewhat suspicious for angina.  He was admitted through  the emergency room and set up for diagnostic catheterization.  His  enzymes were negative.  Catheterization done on Oct 13, 2008, by Dr.  Tresa Endo revealed patent LIMA to the LAD, patent vein graft to the  diagonal, patent  vein graft to the OM, and patent RCA with patent PDA  site from the previous angioplasty in June 2008.  LV function was  normal.  Renal arteries and iliacs were normal.  The patient tolerated  the procedure well and Dr. Tresa Endo reveals he can be discharged later  today.  We added Imdur 30 mg a day and Simcor 500/20 daily.  He will  continue his other medications, which includes Plavix 75 mg a day,  lisinopril 10 mg a day, tramadol 50 mg b.i.d. p.r.n., Prilosec 20 mg  twice a day, Toprol-XL 50 mg a day, Allegra 180 mg a day, Ambien 5 mg at  bedtime p.r.n., aspirin 162 mg a day, and nitroglycerin sublingual  p.r.n.   LABORATORY DATA:  Cholesterol is 207, triglycerides 117, HDL 33, LDL  151, TSH is 0.039.  CK-MB, troponins are negative.  Hemoglobin A1c is  5.9, sodium 140, potassium 4.3, BUN 21, creatinine 0.87.  LFTs were  normal.  INR is 1.1.  White count 6.1, hemoglobin 14.1, hematocrit 40.8,  platelets 284.  Chest x-ray shows mild chronic lung changes, but no  acute changes, there is a stable elevation of the right hemidiaphragm  noted.   DISPOSITION:  The patient is discharged in stable condition.  He will  have a repeat TSH with the free T3 and T4 drawn before discharge.  This  will need to be followed up as an outpatient.  We instructed the patient  to contact Dr. Jacky Kindle regarding this.      Abelino Derrick, P.A.    ______________________________  Thereasa Solo. Little, M.D.    Lenard Lance  D:  11/13/2008  T:  11/14/2008  Job:  [id number]   cc:   Thereasa Solo. Little, M.D.  Geoffry Paradise, M.D.

## 2010-10-20 NOTE — H&P (Signed)
NAME:  Daniel [name], Daniel [name] NO.:  [id number]   MEDICAL RECORD NO.:  [id number]          PATIENT TYPE:  EMS   LOCATION:  MAJO                         FACILITY:  [facility]   PHYSICIAN:  Ulyses Amor, MD DATE OF BIRTH:  08-28-1932   DATE OF ADMISSION:  11/13/2006  DATE OF DISCHARGE:                              HISTORY & PHYSICAL   HISTORY OF PRESENT ILLNESS:  The patient is a 77 year old white man who  is admitted to [facility] for further evaluation of chest pain.   The patient has a history of coronary artery disease which dates back to  31.  At that time he underwent coronary artery bypass surgery.  He  received a left internal mammary artery graft to the LADD, a saphenous  vein graft to the obtuse marginal, and a saphenous vein graft to the  diagonal.  His last cardiac catheterization was performed in 2006  because of chest pain.  This demonstrated a patent graft.  He is known  to have normal left ventricular function.   The patient presented to the emergency department with a two day history  of chest pain  He actually was seen last night but left against medical  advice.  He returned today because of additional chest pain over the  course of the day.  He reports essentially continuous chest pain  yesterday and today.  This was described as a vague pressure across his  anterior chest.  It does not radiate.  It has not been associated with  dyspnea, diaphoresis, or nausea.  There are no exacerbating or  alleviating factors.  It appears not to be related to position,  activity, meals, or respirations.  He has noted associated left arm  numbness.  He was given nitroglycerin in the emergency room which  resulted in improvement, thought temporary and incomplete relief, of his  chest pain.  He did not take any nitroglycerin tablets at home.  Since  he did not have chest pain prior to his coronary artery bypass surgery,  he is unable to compare this chest  pain to a prior chest pain.   The patient has no history of congestive heart failure or arrhythmia.   The patient has a number of risk factors for coronary artery disease  including hypertension and dyslipidemia.  There is no history of  diabetes mellitus, smoking, or family history of coronary artery  disease.   Other medical problems include the ankylosing spondylitis, ulcerative  colitis, and a monoclonal gammopathy.   MEDICATIONS:  Plavix, aspirin, Lipitor, Flomax, Toprol-XL,  lisinopril/hydrochlorothiazide, fexofenadine, and Ambien.   ALLERGIES:  Penicillin and sulfa.   OPERATIONS:  Coronary artery bypass surgery, right knee surgery.   FAMILY HISTORY:  There is no family history of coronary artery disease.   SOCIAL HISTORY:  The patient lives with his wife.  He does not smoke  cigarettes.  He does not drink alcohol.  He is a retired [employer].   REVIEW OF SYSTEMS:  The review of systems reveals no new problems  related to his head, eyes, ears, nose,  mouth, throat, lungs,  gastrointestinal system, genitourinary system, or extremities.  There is  no history of neurologic or psychiatric disorder.  There is no history  of fever, chills, or weight loss.   PHYSICAL EXAMINATION:  VITAL SIGNS:  Blood pressure of 124/76, pulse of  76 and regular, respirations 18, temperature of 98.2.  GENERAL:  The patient was an older white man in no discomfort.  He was  alert, oriented, appropriate, and responsive.  HEAD, EYES, NOSE, AND MOUTH:  The head, eyes, nose, and mouth were  normal.  NECK:  The neck was without thyromegaly or adenopathy.  Carotid pulses  were palpable bilaterally without bruits.  CARDIAC:  Examination revealed a normal S1 and S2.  There was no S3, S4,  murmur, rub, or click.  Cardiac rhythm was regular.  No chest wall  tenderness was noted.  LUNGS:  The lungs were clear.  ABDOMEN:  The abdomen was soft and nontender.  There was no mass,   hepatosplenomegaly, bruit, distention, rebound, guarding, or rigidity.  Bowel sounds were normal.  RECTAL AND GENITAL:  Examinations were not performed as they were not  pertinent to the reason for acute care hospitalization.  EXTREMITIES:  The extremities were without edema, deviation, or  deformity.  Radial and dorsalis pedal pulses were palpable bilaterally.  NEUROLOGICAL:  The brief screening neurologic survey was unremarkable.   INVESTIGATIONS:  The electrocardiogram revealed normal sinus rhythm.  A  QR pattern was present in V1 suggesting right ventricular conduction  delay.  T wave inversion was noted in AVL and ST depression and T wave  inversion noted in V2.  The initial set of cardiac markers revealed a  myoglobin of 77.4, CK-MB of less than 1.0, and troponin less than 0.05.  The second set of cardiac markers revealed a myoglobin of 66.2, CK-MB of  1.4, and troponin of less than 0.05.  Potassium is 3.9, BUN of 21, and  creatinine of 0.9.  The white count was 8.5 with a hemoglobin of 15.7  and hematocrit of 46.9.  The remaining laboratory studies were pending  at the time of this dictation.   The chest radiograph, according to the radiologist, demonstrated no  evidence of acute cardiopulmonary disease.   IMPRESSION:  1. Chest pain; rule out unstable angina, T wave inversion noted in AVL      and ST depression and T wave inversion noted in V2.  2. Coronary artery disease, status post coronary artery bypass surgery      in 1997 with a left internal mammary artery graft to the LAD, a      saphenous vein graft to the obtuse marginal, and a saphenous vein      graft to the diagonal.  The last cardiac catheterization was in      2006 and demonstrated a patent graft.  3. Hypertension.  4. Dyslipidemia.  5. Anklyosing spondylitis.  6. Ulcerative colitis.  7. Monoclonal gammopathy.   PLAN:  1. Telemetry.  2. Serial cardiac enzymes.  3. Aspirin. 4. Intravenous heparin.  5.  Intravenous nitroglycerin.  6. Plavix.  7. Toprol-XL.  8. Further measures per Dr. Clarene Duke.      Ulyses Amor, MD  Electronically Signed     MSC/MEDQ  D:  11/13/2006  T:  11/13/2006  Job:  [id number]   cc:   Ulyses Amor, MD  Thereasa Solo Little, M.D.

## 2010-10-20 NOTE — Discharge Summary (Signed)
NAME:  Daniel [name], Daniel [name] NO.:  [id number]   MEDICAL RECORD NO.:  [id number]          PATIENT TYPE:  INP   LOCATION:  2916                         FACILITY:  [facility]   PHYSICIAN:  Thereasa Solo. Little, M.D. DATE OF BIRTH:  [birth year]   DATE OF ADMISSION:  11/13/2006  DATE OF DISCHARGE:  11/15/2006                               DISCHARGE SUMMARY   DISCHARGE DIAGNOSES:  1. Unstable angina, distal right coronary artery Multi-Link      percutaneous coronary intervention this admission.  2. Coronary artery bypass grafting in 1997.  3. Treated hypertension.  4. Treated dyslipidemia.  5. Normal left ventricular function.  6. History of ankylosing spondylitis.  7. History of ulcerative colitis.  8. Past history of monoclonal gammopathy.   HOSPITAL COURSE:  The patient is a 77 year old male followed by Dr.  Clarene Duke, who had bypass surgery in 1997.  A catheterization in 2006 was  done and he was treated medically.  He had patent grafts with a LIMA to  the LAD, SVG to the diagonal and SVG to the OM and moderate disease in  the native RCA and moderate disease in the small circumflex.  The  patient had been having some chest pain and left arm pain.  His symptoms  were somewhat atypical and initially he felt there were probably  musculoskeletal.  He was actually seen in the emergency room earlier  this weekend but declined admission.  He came back in November 13, 2006, with  persistent left chest and left arm pain.  He was admitted to TCU,  started on heparin and nitrates.  Troponins were negative.  He was set  up for diagnostic catheterization, which was done November 14, 2006, by Dr.  Elsie Lincoln.  The patient had 85% and 90% distal RCA stenosis.  This was  treated with mini Vision stents.  He also received a vascular closure  device.  Other grafts were patent as noted in 2006, although he did have  some narrowing at the insertion of the LIMA to the LAD of about 50-60%.  The patient tolerated  this procedure well and we feel he can be  discharged November 15, 2006.   DISCHARGE MEDICATIONS:  1. Lisinopril 20 mg a day.  2. Prilosec p.r.n.  3. Aspirin 81 mg 2 tablets a day.  4. Toprol XL 50 mg a day.  5. Flomax 0.4 mg a day.  6. Allegra 180 mg a day.  7. Lipitor 10 mg a day.  8. Plavix 75 mg a day.  9. Ambien p.r.n.  10.Ultram p.r.n.  11.Nitroglycerin sublingual p.r.n.   LABORATORY DATA:  White count 9.5, hemoglobin 14.7, hematocrit 43.2,  platelets 235.  Sodium 139, potassium 3.8, BUN 16, creatinine 1.0.  Liver functions were normal.  CK-MB troponins were negative x3.  BNP was  27.  Chest x-ray revealed no acute changes, no acute process.  INR is  1.0.   DISPOSITION:  The patient is discharged in stable condition and will  follow up with Dr. Clarene Duke as an outpatient.      Abelino Derrick, P.A.  ______________________________  Thereasa Solo Little, M.D.    Lenard Lance  D:  11/15/2006  T:  11/15/2006  Job:  [id number]

## 2010-10-20 NOTE — Cardiovascular Report (Signed)
NAME:  Daniel [name], Daniel [name] NO.:  [id number]   MEDICAL RECORD NO.:  [id number]          PATIENT TYPE:  INP   LOCATION:  2916                         FACILITY:  [facility]   PHYSICIAN:  Madaline Savage, M.D.DATE OF BIRTH:  [birth year]   DATE OF PROCEDURE:  11/14/2006  DATE OF DISCHARGE:                            CARDIAC CATHETERIZATION   PROCEDURES PERFORMED:  1. Selective coronary angiography of the native coronary circulation.  2. Retrograde left heart catheterization.  3. Left ventricular angiography.  4. Selective visualization of the internal mammary artery to the LAD.  5. Selective visualization of multiple saphenous vein grafts to      recipient coronary arteries.  6. Pre-dilatation balloon angioplasty of the mid posterior descending      branch of the right coronary artery.  7. Intracoronary artery stent deployment of the mid posterior      descending coronary artery with a non drug-eluting stent.  8. AngioSeal closure of the right femoral artery.   COMPLICATIONS:  None.   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 Caprice Kluver.  He has a  history of coronary artery bypass grafting in 1997, with internal  mammary artery grafting to the LAD, saphenous vein grafting to the  obtuse marginal branch, and a saphenous vein graft to his diagonal.  His  last cardiac cath was performed 2006, and showed patency of all those  grafts.  However, the LIMA graft to the LAD contained a distal narrowing  of approximately 60%.  The patient also has hypertension, dyslipidemia,  ankylosing spondylitis, ulcerative colitis, and monoclonal gammopathy.  The patient has been having chest pain for approximately 3 weeks prior  to admission.  His in-hospital evaluation has shown that his EKG shows  normal sinus rhythm with an incomplete right bundle branch block, some  mild T-wave inversions in lead aVL and V2 that were  fairly nonspecific  and not much else in the way of abnormality on EKG.  His cardiac enzymes  have apparently been negative.  Today, he enters the cath lab electively  to assess his chest pain and possible coronary stenosis explaining his  pain.   RESULTS:   PRESSURES:  The left ventricular pressure was 120/3, end-diastolic  pressure 5.  Central aortic pressure 123/69 with a mean of 93.  There  was no significant aortic valve gradient by pullback technique.   ANGIOGRAPHIC RESULTS:  The patient's left main coronary artery contains  some ostial narrowing of approximately 40%.  His left anterior  descending coronary artery contained a stenosis in the midportion of the  vessel of 80%.  Left circumflex coronary artery showed the ongoing  circumflex to be unremarkable.  However, the obtuse marginal branch #1  contains a tight stenosis of 99%.   Right coronary artery which unbypassed is a large and dominant vessel  giving rise to two posterolateral branches, a posterior descending  branch, and an ongoing right coronary artery.  The proximal mid and  distal RCA were normal.  The posterior descending branch contained two  lesions  of 85% and then 90% in the midportion of the posterior  descending.  This vessel was approximately 2.25-to-5-mm in diameter and  was considered to be the culprit vessel for his angina.   PROCEDURE:  The procedure was performed via the right percutaneous  femoral approach using 6-French diagnostic and interventional catheters.  The guide catheter used was an FR-4 guide catheter which was a good  choice.  The guidewire used which was an excellent choice was a Midwife.  A primary bend was not successful in negotiating the sharp turn  from distal RCA into PDA, so I had to create a secondary bend into the  Prowater wire, two bends in the same direction, and that nicely  traversed the RCA and then drove itself into the posterior descending  branch successfully.  I  was then able to pre-dilate the vessel using a  Maverick balloon approximately 20-mm in length and it gave me an  opportunity to size the vessel.  I then chose a mini Vision 2.25 x 28  stent non-drug-eluting.  I blew the stent balloon up and deployed the  stent and then post dilated with a 2 x 20- Quantum Maverick, and after  dilating that to 18 atmospheres of mercury, I was able to pull the  balloon back, take another picture, and noted that the two areas of 85-  90% stenosis were reduced to 0% residual.  TIMI III flow was preserved.  No complications occurred.   FINAL DIAGNOSES:  1. Multivessel native coronary artery disease as described above.      a.     A 40% left main.      b.     An 80% mid-LAD.      c.     A 99% mid first obtuse marginal branch.  2. New de novo lesions of 85 and 90% in the mid posterior descending.  3. Normal left ventricular systolic function.  4. Status post coronary artery bypass grafting.      a.     Internal mammary artery graft to LAD patent with a 60%       distal anastomotic site stenosis.      b.     Patent saphenous vein graft to diagonal which supplied both       diagonal antegrade and retrograde but also filled LAD antegrade       and retrograde.  There was competitive flow between vein graft to       diagonal and internal mammary artery graft to LAD.      c.     Patent saphenous vein graft to mid obtuse marginal branch       #1.  5. Successful percutaneous coronary artery stenting of the mid      posterior descending branch of the right coronary artery with a      2.25- mm x 28-mm non-drug-eluting stent, mini Vision.  6. Successful right percutaneous coronary Angio-Seal.   ADDENDUM:  To above report, the patient received Angiomax during the  procedure.  He had been on Plavix for some time.  The Angiomax produced  an ACT of approximately 350 seconds.  The Angiomax was turned off at the completion of the case.  Plavix 300 mg was given during the  case, and  AngioSeal closure worked Agricultural consultant and gave him good hemostasis.  He  had a dorsalis pedis pulse of 2+ following procedure and a posterior  tibial pulse of 3+.  ______________________________  Madaline Savage, M.D.     WHG/MEDQ  D:  11/14/2006  T:  11/14/2006  Job:  [id number]   cc:   Thereasa Solo. Little, M.D.  [facility] medical records

## 2010-10-20 NOTE — Op Note (Signed)
NAME:  Daniel [name], Daniel [name] NO.:  [id number]   MEDICAL RECORD NO.:  [id number]          PATIENT TYPE:  AMB   LOCATION:  SDS                          FACILITY:  [facility]   PHYSICIAN:  Kerrin Champagne, M.D.   DATE OF BIRTH:  09-Apr-1933   DATE OF PROCEDURE:  10/24/2008  DATE OF DISCHARGE:                               OPERATIVE REPORT   PREOPERATIVE DIAGNOSIS:  Severe lumbar spinal stenosis, central L4-5.   POSTOPERATIVE DIAGNOSIS:  Severe lumbar spinal stenosis, central L4-5.   PROCEDURE:  Central decompressive laminectomy L4-5 using operating room  microscope for foraminotomies, bilateral L4 and bilateral L5 nerve  roots.   SURGEON:  Kerrin Champagne, MD   ASSISTANT:  Wende Neighbors, PA-C   ANESTHESIA:  General via orotracheal intubation, Dr. Jacklynn Bue  supplemented with local infiltration with Marcaine 0.5% with 1:200,000  epinephrine 10 mL.   FINDINGS:  Severe lumbar spinal stenosis centrally, the L4-5 with  compression on bilateral L5 and bilateral L4 nerve roots.   ESTIMATED BLOOD LOSS:  75 mL.   COMPLICATIONS:  None.   DRAINS:  Foley to straight drain   The patient returned to the PACU in good condition.   HISTORY OF PRESENT ILLNESS:  The patient is a 77 year old male with  progressive neurogenic claudication has been followed and treated over  the last several years with intermittent epidural steroid injections  with good relief more recently; however, the injections have not been  able to provide relief and he is experiencing increasing numbness,  difficulty with prolong standing, and prolong ambulation.  He was  brought to the operating room to undergo decompression for a well  established diagnosis of the lumbar spinal stenosis with myelogram and  CT scans demonstrating severe central stenosis L4-5 with associated  grade 1 spondylolisthesis of 2-3 mm.  With flexion and extension  radiographs, the spondylolisthesis does not change appreciably.   INTRAOPERATIVE FINDINGS:  As above.   DESCRIPTION OF PROCEDURE:  After adequate general anesthesia, the  patient in the preoperative holding area did have standard preoperative  markings of the excepted area for incision and surgery of L4-5 and left  side noted to be more painful than right.  After his general anesthetic,  Foley catheter was placed as discussed preoperatively with the patient.  The patient was then rolled to prone position.  A Wilson frame with  reversed Skytron table was used.  Standard preoperative antibiotics of  vancomycin for penicillin allergy.  He has prepped with DuraPrep  solution of  lower dorsal spine to upper sacral spine in the midline.  Draped in the usual manner, iodine Vi-Drape was used.  Initial x-rays  were taken with spinal needles placed at excepted L4-5 level after first  going though a standard time-out process with evaluation of the patient,  expected procedure, the length was surgical treatment, and excepted  blood loss, or complications.  The patient's radiographs obtained  demonstrated needles posterior to the L4 spinous process and incision  was then made proximally to 2-1/2 inches in length through the skin and  subcutaneous layers after infiltration  with Marcaine 0.5% with 1:200,000  epinephrine 10 mL.  Incision through the skin and subcu layers directly  down to the spinous process of L3, L4, and L5.  Electrocautery then used  to incise the supraspinous ligament overlying the L4, L3, and L5 levels.  Cobbs used and then carefully elevated the paralumbar muscles  bilaterally off the spinous process.  Clamps were placed at the excepted  spinous process of L4 and within the interval between the spinous  process of L4 and L5 intraoperative lateral radiograph demonstrated  these to be in the correct location.  The spinous process of L4 was then  resected using Leksell rongeur.  Note that, continued exposure was  obtained carefully freeing the  paralumbar muscles off the posterior  elements, posterior lamina, and preserving the facet capsules at L4-5  and L3-4 in the process.  McCullough retractor was inserted for  visualization.  Loupe magnification and headlamp was used during this  portion of procedure.  About 1/5th of the upper aspect of the L5 spinous  process and approximately 30% of the inferior spinous process of L3 were  resected as well.  Adjacent to the spinous process of L4 and L4 spinous  process taken down to the posterior aspect of the lamina to the L4.  Thinning the posterior aspect of the lamina of L4 using Leksell rongeur  in the midline.  Posterior aspect of the lamina of L4 was further  thinned again using Leksell rongeur on both sides, so that a 3-mm  Kerrison was able to be introduced underneath the posterior aspect of  the lamina of L4 and resected the bone centrally performing an initial  laminotomies centrally.  This was then carried out to the L3-4 level  where the L3-4 level debridement of ligamentum flavum was carried out  bilaterally of the medial aspect of the facets of the L3-4 level and up  to the insertion of ligamentum flavum on to the ventral surface of the  inferior aspect of the lamina of L3.  Resection was then carried out  through the lamina bilaterally preserving pars over an area of about 7-8  mm thick at the pars level bilaterally.  Osteotomes were then used,  osteotomized medial aspect of the facet of 10-15%.  A 3-mm Kerrison used  to resect this portion after osteotomy and green sticking here.  Ligamentum flavum at the 4-5 level was then carefully resected from  superior to inferior in the midline and then carefully elevated off of  thecal sac.  Thecal sac retracted with the D'Errico and then debrided  off of the medial aspect of the facet of the L4-5 level down to the  superior aspect of the lamina of L5.  Laminotomies were performed over  both L5 nerve roots decompressing these areas.   And the lateral recesses  of 4-5 level were then further decompressed such that the pedicle at L5  was easily palpated using a hockey-stick Neoprobe.  Operating room  microscope was brought into the field draped sterile and under the  operating room microscope lateral recesses were decompressed and  ligamentum flavum that was adherent to the thecal sac carefully debrided  off of the thecal sac bilaterally.  Both neuroforamen for the L4 nerve  roots and both neuroforamen for the L5 nerve roots had been  foraminotomized with resection of hypertrophic flavum affecting these  areas.  Bone wax was applied to the bleeding cancellous bone surfaces.  Thecal sac showed normal appearance with filling out  and normal  pulsation CSF fluid following the decompression.  Irrigation was then  carried out with copious amounts of irrigant solution.  The patient  showed no active bleeding at this point.  The paralumbar muscles were  reapproximated loosely in the midline with interrupted 0 Vicryl sutures  down to the lumbodorsal fascia reapproximated in the midline with  interrupted #1 Vicryl sutures.  Deep subcu layers approximated with  interrupted #0 Vicryl suture.  More superficial layers with interrupted  2-0 Vicryl sutures.  Skin was closed with the running subcu stitch of 4-  0 Vicryl.  Dermabond was then applied and 4 x 4's fixed to skin with  Hypafix tape.  The patient was then returned to supine position,  reactivated, and extubated and returned to recovery room in satisfactory  condition.  All instruments and sponge counts were correct.     Kerrin Champagne, M.D.  Electronically Signed    JEN/MEDQ  D:  10/24/2008  T:  10/25/2008  Job:  [id number]

## 2010-10-20 NOTE — Cardiovascular Report (Signed)
NAME:  [name], [name] NO.:  [id number]   MEDICAL RECORD NO.:  [id number]          PATIENT TYPE:  INP   LOCATION:  3734                         FACILITY:  [facility]   PHYSICIAN:  Nicki Guadalajara, M.D.     DATE OF BIRTH:  1932/11/20   DATE OF PROCEDURE:  11/13/2008  DATE OF DISCHARGE:  11/13/2008                            CARDIAC CATHETERIZATION   INDICATIONS:  Daniel [name] is a 77 year old patient of Dr. Clarene Duke  and Dr. Jacky Kindle who has known coronary artery disease.  He underwent  CABG revascularization surgery x3 in 1997, at which time, he had a LIMA  placed to his LAD, and vein graft to his diagonal vessel, and vein graft  to his OM vessel.  His last catheterization was done in 2003, with high-  grade distal RCA and PDA stenosis, for which he underwent successful.  He has been on medical therapy since that time.  He was admitted to  [facility] yesterday after he experienced chest tightness  associated with shortness of breath.  His pain ultimately resolved.  He  was admitted to [facility].  Cardiac enzymes were negative.  He  is referred for definitive diagnostic cardiac catheterization.   PROCEDURE:  After premedication with Valium 5 mg intravenously plus  fentanyl 25 mcg, the patient was prepped and draped in usual fashion.  His right femoral artery was punctured anteriorly and a 5-French sheath  was inserted without difficulty.  Diagnostic catheterization was done  utilizing 5-French Judkins for right and left coronary catheters.  The  right catheter was used for selective angiography into both vein grafts.  A left internal mammary artery catheter was necessary for selective  angiography into left internal mammary artery.  A 5-French pigtail  catheter was used for biplane cine left ventriculography.  Distal  aortography was performed to evaluate his renal arteries and make  certain he did not have significant aortoiliac disease.  He tolerated  the procedure well and returned to his room in satisfactory condition.   HEMODYNAMIC DATA:  Central aortic pressure was 150/64.  Left ventricular  pressure 150/5, post A-wave 16.   ANGIOGRAPHIC DATA:  There was evidence for 70-80% somewhat eccentric  ostial left main stenosis with diffuse narrowing of 50% and a small all  luminal left main.  Left main bifurcated into an LAD and left circumflex  system.  After giving rise to a very small initial diagonal vessel,  which was now at bypass.  There was diffuse 80-90% stenosis.  This was  followed by a flush and fill phenomenon due to competitive filling via  the grafts to the LAD system.   The circumflex vessel had subtotal/total occlusion at the origin of the  marginal vessel proximally.  The AV groove circumflex was small caliber.   The right coronary artery was a large-caliber dominant vessel that had  30% narrowing proximal to the crux, 30% narrowing beyond the crux prior  to the PDA, and there was evidence for widely patent stent in the PD mid  PDA vessel from the prior intervention of June 2008.  The vein graft supplying the diagonal vessel was a large graft.  There  was excellent filling distal to the graft in the diagonal system and in  addition, there was excellent filling back into the LAD system.  Both  antegrade as well as both down the LAD system with brisk TIMI III flow  down the LAD system from this saphenous vein graft to the diagonal  vessel.   The vein graft supplying the marginal vessel was a large graft that had  smooth 10-20% proximal narrowing.  There was 20% focal eccentric  narrowing in the distal third of the graft.  The graft anastomosed into  the circumflex marginal vessel and the distal circumflex marginal was  free of disease.   The left internal mammary artery was a small caliber-atretic looking  LIMA graft, which still anastomosed into the LAD but a flush and fill  phenomena was seen due to the brisk  flow to the brisk TIMI III flow  which already been seen down the LAD via the SVG to diagonal graft.   Biplane cine-left ventriculography revealed preserved global  contractility.  On the RAO projection, there was just a minimal subtle  area of residual.   Mid-inferior hypocontractility.  Contractility was normal on the LAO  projection.  Ejection fraction estimate was 55%.   Distal aortography did not reveal any renal artery stenosis.  There is  normal aortoiliac system.   IMPRESSION:  1. Normal left ventricular function with subtle mid-inferior      hypocontractility.  2. Significant native coronary obstructive disease with 70-80% ostial      left main stenosis with diffuse tubular left main narrowing of      probably 50%; 80-90% proximal left anterior descending stenosis;      total occlusion of his circumflex marginal vessel; and unbypassed      native right coronary artery with 30% narrowings before and after      the crux and with widely patent stent in mid- Posterior descending      artery from prior intervention to 2008.  Next patent large vein      graft supplying to the diagonal vessel, which also fills.  The LAD      system with TIMI III flow.  Next, patent vein graft supplying the      circumflex marginal vessel with mild 10-20% smooth proximal      narrowing in 20% focal distal graft eccentric narrowing.  Next,      small somewhat atretic looking LIMA graft, which anastomoses into      the mid LAD, but a flush and fill phenomena is seen most likely due      to competitive filling from predominantly the SVG diagonal LAD      blood flow.   RECOMMENDATIONS:  Medical therapy.  The patient will require more  aggressive lipid intervention.           ______________________________  Nicki Guadalajara, M.D.     TK/MEDQ  D:  11/13/2008  T:  11/14/2008  Job:  [id number]   cc:   Thereasa Solo. Little, M.D.  Geoffry Paradise, M.D.

## 2010-10-23 NOTE — Op Note (Signed)
NAME:  Daniel [name], DUMOND NO.:  [id number]   MEDICAL RECORD NO.:  [id number]          PATIENT TYPE:  AMB   LOCATION:  NESC                         FACILITY:  [facility]   PHYSICIAN:  Valetta Fuller, M.D.  DATE OF BIRTH:  1932/07/30   DATE OF PROCEDURE:  03/02/2006  DATE OF DISCHARGE:  03/02/2006                                 OPERATIVE REPORT   PREOPERATIVE DIAGNOSIS:  Left distal ureteral stone 5 mm.   POSTOPERATIVE DIAGNOSIS:  Left distal ureteral stone 5 mm.   PROCEDURES PERFORMED:  1. Cystoscopy.  2. Left retrograde pyelography.  3. Left ureteroscopy and ureteroscopic laser lithotripsy and left stone      extraction using basket.  4. Left stent insertion (5 x 26 Polaris).   SURGEON:  Valetta Fuller, M.D.   ASSISTANT:  Cornelious Bryant, MD   ANESTHESIA:  General.   SPECIMEN:  Left ureteral calculus fragments.   ESTIMATED BLOOD LOSS:  Minimal.   COMPLICATIONS:  None.   INDICATIONS:  This is a 77 year old gentleman who present about 4 to 6 weeks  ago with a left proximal 5 mm ureteral stone.  The patient was given a trial  of spontaneous passage.  Presented couple weeks ago to the emergency room  with a left-sided flank pain and a repeat imaging did reveal that the stone  now was dislodged in the distal ureter.  After extensive counseling the  patient elected for definitive stone management.   DESCRIPTION OF PROCEDURE:  The patient was brought to the operating room.  Preoperative antibiotics were given.  General anesthesia was induced.  He  was placed in dorsal lithotomy position.  Time-out was taken to properly  identify the patient, procedure going to be done.  Care was taken to avoid  neuropathy, compartment syndrome, and DVT.  A 22-French cystoscope was then  used to access the bladder.  The anterior posterior urethral mucosa was  devoid of any masses, lesions or stones.  The lateral lobes of prostate were  noted to be mild to moderate hypertrophied  with no evidence of large median  lobe.  Upon entering the bladder, there were no obvious suspicious mass  lesions.  The left ureteral orifice was identified and cone-tip catheter was  inserted.  Retrograde pyelography was obtained on the left side after  injection of contrast gently.  Left retrograde pyelography demonstrated  opacification of the distal left ureter with the presence of filling defect  consistent with the distal left ureteral stone.  The ureteral catheter was  then taken out.  A sensor wire was then advanced through the left ureteral  orifice to the level of the left renal pelvis under fluoroscopic guidance.  A semi-rigid ureteroscope was then used to access the left ureteral orifice.  This was technically challenging due to a mild stenosis of the left ureteral  orifice.  The ureteroscope was then taken out and over the sensor wire that  was already in place, the inner dilator of the access sheath was then used  to dilate the left ureteral orifice.  Following dilation the semi-rigid  ureteroscope was then used to access the distal ureter.  The ureter was then  advanced to about 2 to 3 cm proximal to the left ureteral orifice.  There  was 5 mm stone that was seen on imaging.  360 micron  laser fiber was then  used to fragment the stone into small fragments ranging between 0.3 and 0.5  mm.  Number of the stones were then basketed out and sent for analysis.  The  semi rigid ureteroscope was then taken out and a 5 x 26 Polaris was then  inserted under fluoroscopic guidance over the Glidewire until the proximal  end of the stent coiled in the left renal pelvis.  Please note that the  tether was kept in place and was not cut out.  Confirmation of the  positioning of the distal end of the left ureteral stent was done with  cystoscope.  The loops of the Polaris stent were half way out the left  ureteral orifice but were in no way close or impinging on the prostatic  urethra.  The  scope was then taken out after the bladder was emptied.  The  tether was then tied to the shaft of the penis making sure that the tether  is not too tight and there is some leeway that would accommodate some  erections without impinging on the skin of the penis.  The patient was then  woken up and extubated in the operating room with no complications.  He was  taken in stable condition to PACU.  Please note that Dr. Isabel Caprice was present  and participated in the entire procedure as he was the responsible surgeon.     ______________________________  Cornelious Bryant, MD      Valetta Fuller, M.D.  Electronically Signed    SK/MEDQ  D:  03/02/2006  T:  03/04/2006  Job:  [id number]

## 2010-10-23 NOTE — Op Note (Signed)
NAME:  Daniel [name], Daniel [name] NO.:  [id number]   MEDICAL RECORD NO.:  [id number]          PATIENT TYPE:  AMB   LOCATION:  ENDO                         FACILITY:  [facility]   PHYSICIAN:  John C. Madilyn Fireman, M.D.    DATE OF BIRTH:  02-03-1933   DATE OF PROCEDURE:  07/09/2004  DATE OF DISCHARGE:                                 OPERATIVE REPORT   PROCEDURE:  Colonoscopy.   INDICATIONS FOR PROCEDURE:  History of longstanding ulcerative colitis, due  for surveillance due to high risk for colon cancer.  He also has had chronic  right-sided abdominal pain of unclear etiology.   PROCEDURE:  The patient was placed in the left lateral decubitus position  and placed on the pulse monitor with continuous low-flow oxygen delivered by  nasal cannula.  He was sedated with 50 mcg IV fentanyl  and 5 mg IV Versed  in addition to the medicine given for the previous EGD.  The Olympus video  colonoscope was inserted into the rectum and advanced to the cecum,  confirmed by transillumination of McBurney's point and visualization of the  ileocecal valve and appendiceal orifice.  The prep was excellent.  The cecum  and ascending colon appeared normal with no masses, polyps, diverticula or  other mucosal abnormalities.  Within the descending and sigmoid colon there  were seen several scattered diverticula and no other abnormalities.  The  rectum appeared normal, and retroflexed view of the anus revealed no obvious  internal hemorrhoids.  The scope was then withdrawn and the patient returned  to the recovery room in stable condition.  He tolerated the procedure well,  and there were no immediate complications.   IMPRESSION:  Diverticulosis, otherwise normal study.   PLAN:  Await all biopsy results.  We will consider abdominal CT scan of this  has not been done recently for further evaluation of his abdominal pain.      JCH/MEDQ  D:  07/09/2004  T:  07/09/2004  Job:  [id number]   cc:   Geoffry Paradise,  M.D.  [address]  [place]  [place] [address]  Fax: [phone]

## 2010-10-23 NOTE — Cardiovascular Report (Signed)
NAME:  Daniel [name], Daniel [name] NO.:  [id number]   MEDICAL RECORD NO.:  [id number]          PATIENT TYPE:  OIB   LOCATION:  2039                         FACILITY:  [facility]   PHYSICIAN:  Darlin Priestly, MD  DATE OF BIRTH:  08-Sep-1932   DATE OF PROCEDURE:  11/04/2004  DATE OF DISCHARGE:                              CARDIAC CATHETERIZATION   PROCEDURE:  1.  Left heart catheterization.  2.  Coronary angiography.  3.  Left ventriculogram.  4.  Saphenous vein grafts.  5.  Left internal mammary angiography.  6.  Right femoral Star-Close device.   ATTENDING:  Lenise Herald, M.D.   COMPLICATIONS:  None.   INDICATIONS:  Daniel [name] is a 77 year old male patient of Dr. Caprice Kluver, and  Dr. Bernadene Person, with a history of CAD status post coronary artery bypass  surgery in 1997 consisting of LIMA to LAD, vein graft to OM, vein graft to  diagonal.  His last Cardiolite scan in September of 2005, revealed no  significant ischemia and normal EF.  Recently he was complaining of  increasing chest pain and shortness of breath.  He is now referred for  cardiac catheterization to rule out significant disease of the grafts.   DESCRIPTION OF OPERATION:  After informed consent, the patient brought to  the cardiac catheterization lab, the left groin was shaved, prepped and  draped in the usual sterile fashion.  ECG monitoring established.  Using  modified Seldinger technique, a #6 French arterial sheath was inserted in  the right femoral artery.  A 6 French diagnostic catheter was then used to  perform a diagnostic angiography.   The left main is a medium sized vessel with diffuse 60% disease throughout  the mid and distal segments.   The LAD is a medium sized vessel which coursed through the apex __________  two diagonal branches.  The LAD is diffusely diseased up to 60 and 70% in  its proximal and early mid segment with a 7% focal stenosis proximal to the  second diagonal.   The  first diagonal is a small vessel with no significant disease.   The second diagonal is a medium sized vessel which fills in antegrade  fashion.  There is also a patent saphenous vein graft with insertion in the  proximal portion of the diagonal.  There is no significant disease in the  grafts with distal graft insertion.   There is a small, but patent, LIMA which inserts in the mid portion of the  LAD.  This does completely fill the LAD as well as retrograde fill the LAD  at the diagonal.  Again, this is a small but patent vessel.   Left circumflex is a small vessel which courses the AV groove and gives rise  to three obtuse marginal branches.  The AV groove circumflex has a 70%  distal stenosis in a small vessel.   The first and third OMs are small vessels with no significant disease.   The second OM is a medium sized vessel which fills via a patent saphenous  vein graft into the mid  portion.  The vein graft itself has a 30-40%  proximal stenosis but no further high grade lesions.  There is no disease in  the OM beyond the graft insertion.   The right coronary artery is a large vessel which is dominant.  It gives  rise to both PDA as well as posterior LV branch.  The RCA is noted to have  mild 30% proximal narrowing, 60% mid and 50% distal stenosis.   The PDA is a small vessel with sequential 60 and 70% lesions.  The PLA has  no significant disease.   LEFT VENTRICULOGRAM:  Reveals preserved EF at 50-55%.   HEMODYNAMIC SYSTEM:  Arterial pressure 138/70, LV systemic pressure 138/4,  LVEDP 14.   Following the conclusion of the case, the right femoral site was then closed  using a Star-Close device without complications.  Vancomycin 1 g was given  prophylactic.   CONCLUSION:  1.  Significant left main and three vessel coronary artery disease.  2.  Patent, but small, left internal mammary artery to the left anterior      descending.  3.  Patent saphenous vein graft to the  diagonal.  4.  Patent vein graft to the obtuse marginal.  5.  Normal left ventricular systolic function.  6.  Successful closure of the right femoral site using a Star-Close device.       RHM/MEDQ  D:  11/04/2004  T:  11/04/2004  Job:  [id number]   cc:   Thereasa Solo. Little, M.D.  [address]  [address]  [place]  [place] [address]  Fax: [phone]   Geoffry Paradise, M.D.  [address]  [place]  [place] [address]  Fax: [phone]

## 2010-10-23 NOTE — Op Note (Signed)
[facility]  Patient:    Daniel [name], Daniel [name]                       MRN: [id number] Proc. Date: 07/11/00 Adm. Date:  [id number] Attending:  Louie Bun CC:         Richard A. Jacky Kindle, M.D.  Demetria Pore. Coral Spikes, M.D.   Operative Report  PROCEDURE:  Colonoscopy.  GASTROENTEROLOGIST:  Everardo All. Madilyn Fireman, M.D.  INDICATIONS FOR PROCEDURE:  Patient with a history of longstanding ulcerative colitis of greater than 10 years duration, due for colonoscopic surveillance due to increased risk of colon cancer.  DESCRIPTION OF PROCEDURE:   The patient was placed in the left lateral decubitus position and placed on the pulse monitor with continuous low-flow oxygen delivered by nasal cannula.  He was sedated with 80 mg IV Demerol and 8 mg IV Versed.  The Olympus video colonoscope was inserted into the rectum and advanced as far as possible, inserted to its entire length; but due to tortuosity and looping and patient discomfort, I was unable to reach the cecum even with the scope fully inserted and despite multiple torquing maneuvers, abdominal pressure, and position changes.  I could see the ileocecal valve but could not visualize the cecum beyond it.  The prep was fairly good, but it was slightly limited in the ascending colon.  The visualized portion of the distal cecum and ileocecal valve appeared normal.  The ascending colon likewise appeared normal with no masses, polyps, diverticula, or other mucosal abnormalities.  The transverse colon likewise appeared normal.  In the descending and sigmoid colon were seen a few scattered diverticula.  In the descending colon, a sessile, 8 to 10 mm polyp was seen and removed by snare. There were no areas that showed clearly visible evidence of colitis, even down to the rectum.  There was some hyperemia in the rectum but no edema or granularity.  Biopsies were taken in the rectum and sent in a separate specimen container.  The  colonoscope was then withdrawn, and the patient returned to the recovery room in stable condition.  He tolerated the procedure well, and there were no immediate complications.  IMPRESSION: 1. Left-sided diverticulosis. 2. Single descending colon polyp. 3. No visible evidence of colitis.  PLAN:  Will await biopsy results to determine interval for next surveillance. DD:  07/11/00 TD:  07/11/00 Job: [id number]

## 2010-10-23 NOTE — Op Note (Signed)
NAME:  Daniel [name], Daniel [name] NO.:  [id number]   MEDICAL RECORD NO.:  [id number]          PATIENT TYPE:  AMB   LOCATION:  ENDO                         FACILITY:  [facility]   PHYSICIAN:  John C. Madilyn Fireman, M.D.    DATE OF BIRTH:  1932/10/31   DATE OF PROCEDURE:  07/09/2004  DATE OF DISCHARGE:                                 OPERATIVE REPORT   PROCEDURE:  Esophagogastroduodenoscopy with biopsy.   INDICATIONS FOR PROCEDURE:  Chronic right-sided abdominal pain with  relatively exhaustive workup,  recent increased pain, history of a small  prepyloric ulcers in the past, history of apparent submucosal leiomyoma on  last EGD.   PROCEDURE:  The patient was placed in the left lateral decubitus position  then placed on the pulse monitor with continuous low-flow oxygen delivered  by nasal cannula. He was sedated with 75 mcg IV fentanyl and 7.5 milligram  IV Versed. The Olympus video endoscope was advanced under direct vision into  the oropharynx and esophagus. The esophagus was slightly tortuous but of  normal caliber with the squamocolumnar line at 38 cm. There was no visible  hiatal hernia, ring, stricture or other abnormality of the GE junction. The  stomach was entered and a small amount of liquid secretions were suctioned  from the fundus. Retroflexed view of cardia was unremarkable. Along the  lesser curvature toward the anterior wall proximal body, there was a  submucosal 1.5 cm nodule by previous EGD report. I estimated this to be  approximately 1.5 cm when it was last looked at in 1998 as well. There were  two with three smaller or subtle similar lesions adjacent to it as was seen  on the previous EGD. There were no mucosal abnormalities associated with  this lesion and no other mucosal abnormalities of the body and antrum. A CLO-  test was obtained due to his previous history of peptic ulcer disease. The  pylorus was nondeformed easily allowed passage of the endoscope tip into  the  duodenum. Both the bulb and second portion are well inspected and appear to  be within normal limits.  The scope was then withdrawn and the patient  prepared for colonoscopy.  He tolerated the procedure well. There were no  immediate complications.   IMPRESSION:  1.  Submucosal nodules not significantly changed from previous study and      largest ones suggestive of leiomyoma status post biopsies.   PLAN:  Await biopsy and CLO-test and will proceed colonoscopy.     JCH/MEDQ  D:  07/09/2004  T:  07/09/2004  Job:  [id number]   cc:   Geoffry Paradise, M.D.  [address]  [place]  [place] [address]  Fax: [phone]

## 2011-03-25 LAB — CARDIAC PANEL(CRET KIN+CKTOT+MB+TROPI)
CK, MB: 1.3
CK, MB: 1.5
Relative Index: INVALID
Relative Index: INVALID
Total CK: 17
Total CK: 30
Troponin I: 0.01
Troponin I: 0.01

## 2011-03-25 LAB — I-STAT 8, (EC8 V) (CONVERTED LAB)
Acid-Base Excess: 1
Acid-Base Excess: 3 — ABNORMAL HIGH
BUN: 19
BUN: 21
Bicarbonate: 26 — ABNORMAL HIGH
Bicarbonate: 26.6 — ABNORMAL HIGH
Chloride: 107
Chloride: 109
Glucose, Bld: 109 — ABNORMAL HIGH
Glucose, Bld: 154 — ABNORMAL HIGH
HCT: 48
HCT: 49
Hemoglobin: 16.3
Hemoglobin: 16.7
Operator id: 151321
Operator id: 196461
Potassium: 3.9
Potassium: 3.9
Sodium: 141
Sodium: 141
TCO2: 27
TCO2: 28
pCO2, Ven: 34.4 — ABNORMAL LOW
pCO2, Ven: 44.2 — ABNORMAL LOW
pH, Ven: 7.387 — ABNORMAL HIGH
pH, Ven: 7.487 — ABNORMAL HIGH

## 2011-03-25 LAB — DIFFERENTIAL
Basophils Absolute: 0
Basophils Relative: 0
Eosinophils Absolute: 0
Eosinophils Relative: 0
Lymphocytes Relative: 18
Lymphs Abs: 1.5
Monocytes Absolute: 0.8 — ABNORMAL HIGH
Monocytes Relative: 9
Neutro Abs: 6.2
Neutrophils Relative %: 73

## 2011-03-25 LAB — COMPREHENSIVE METABOLIC PANEL
ALT: 28
AST: 20
Albumin: 3.5
Alkaline Phosphatase: 77
BUN: 19
CO2: 27
Calcium: 9.7
Chloride: 108
Creatinine, Ser: 0.84
GFR calc Af Amer: >60
GFR calc non Af Amer: >60
Glucose, Bld: 100 — ABNORMAL HIGH
Potassium: 3.7
Sodium: 141
Total Bilirubin: 0.9
Total Protein: 6.8

## 2011-03-25 LAB — POCT I-STAT CREATININE
Creatinine, Ser: 0.9
Creatinine, Ser: 0.9
Creatinine, Ser: 0.9
Operator id: 151321
Operator id: 196461
Operator id: 196461

## 2011-03-25 LAB — CBC
HCT: 41.5
HCT: 43.2
HCT: 46.9
HCT: 47.8
Hemoglobin: 14
Hemoglobin: 14.7
Hemoglobin: 15.7
Hemoglobin: 16
MCHC: 33.4
MCHC: 33.5
MCHC: 33.7
MCHC: 34
MCV: 85.6
MCV: 86.3
MCV: 87.3
MCV: 87.5
Platelets: 228
Platelets: 235
Platelets: 246
Platelets: 249
RBC: 4.81
RBC: 5.04
RBC: 5.37
RBC: 5.47
RDW: 14.3 — ABNORMAL HIGH
RDW: 14.4 — ABNORMAL HIGH
RDW: 14.6 — ABNORMAL HIGH
RDW: 14.8 — ABNORMAL HIGH
WBC: 7.1
WBC: 8.5
WBC: 9.5
WBC: 9.8

## 2011-03-25 LAB — CK TOTAL AND CKMB (NOT AT ARMC)
CK, MB: 2.1
Relative Index: INVALID
Total CK: 44

## 2011-03-25 LAB — PROTIME-INR
INR: 1
INR: 1.2
INR: 6.4
Prothrombin Time: 13.4
Prothrombin Time: 15
Prothrombin Time: 61.6 — ABNORMAL HIGH

## 2011-03-25 LAB — BASIC METABOLIC PANEL
BUN: 16
BUN: 18
CO2: 25
CO2: 28
Calcium: 8.5
Calcium: 9.2
Chloride: 103
Chloride: 104
Creatinine, Ser: 0.72
Creatinine, Ser: 1.07
GFR calc Af Amer: >60
GFR calc Af Amer: >60
GFR calc non Af Amer: >60
GFR calc non Af Amer: >60
Glucose, Bld: 108 — ABNORMAL HIGH
Glucose, Bld: 135 — ABNORMAL HIGH
Potassium: 3.8
Potassium: 4
Sodium: 132 — ABNORMAL LOW
Sodium: 139

## 2011-03-25 LAB — POCT CARDIAC MARKERS
CKMB, poc: 1.4
CKMB, poc: 1.7
CKMB, poc: <1 — ABNORMAL LOW
Myoglobin, poc: 66.2
Myoglobin, poc: 70
Myoglobin, poc: 77.4
Operator id: 151321
Operator id: 196461
Operator id: 277751
Troponin i, poc: <0.05
Troponin i, poc: <0.05
Troponin i, poc: <0.05

## 2011-03-25 LAB — APTT
aPTT: 29
aPTT: 36

## 2011-03-25 LAB — HEPARIN LEVEL (UNFRACTIONATED)
Heparin Unfractionated: 0.11 — ABNORMAL LOW
Heparin Unfractionated: 0.96 — ABNORMAL HIGH

## 2011-03-25 LAB — MAGNESIUM: Magnesium: 2.2

## 2011-03-25 LAB — TROPONIN I: Troponin I: 0.02

## 2011-03-25 LAB — B-NATRIURETIC PEPTIDE (CONVERTED LAB): Pro B Natriuretic peptide (BNP): 27

## 2012-01-26 DIAGNOSIS — IMO0002 Reserved for concepts with insufficient information to code with codable children: Secondary | ICD-10-CM | POA: Insufficient documentation

## 2012-01-26 DIAGNOSIS — Z961 Presence of intraocular lens: Secondary | ICD-10-CM | POA: Insufficient documentation

## 2012-01-26 DIAGNOSIS — H26499 Other secondary cataract, unspecified eye: Secondary | ICD-10-CM | POA: Insufficient documentation

## 2012-06-23 ENCOUNTER — Ambulatory Visit
Admission: RE | Admit: 2012-06-23 | Discharge: 2012-06-23 | Disposition: A | Discharge status: Home / Self Care | Payer: Medicare PPO | Source: Ambulatory Visit | Attending: Allergy and Immunology | Admitting: Allergy and Immunology

## 2012-06-23 ENCOUNTER — Other Ambulatory Visit: Payer: Self-pay | Admitting: Allergy and Immunology

## 2012-06-23 DIAGNOSIS — R079 Chest pain, unspecified: Secondary | ICD-10-CM

## 2012-10-27 ENCOUNTER — Other Ambulatory Visit: Payer: Self-pay | Admitting: *Deleted

## 2012-10-27 MED ORDER — NITROGLYCERIN 0.4 MG SL SUBL: 0.4000 mg | SUBLINGUAL_TABLET | SUBLINGUAL | Status: DC | PRN

## 2012-10-27 NOTE — Telephone Encounter (Signed)
Refills sent to pharmacy. 

## 2012-12-26 ENCOUNTER — Ambulatory Visit (INDEPENDENT_AMBULATORY_CARE_PROVIDER_SITE_OTHER): Payer: Medicare PPO | Admitting: Cardiology

## 2012-12-26 ENCOUNTER — Encounter: Payer: Self-pay | Admitting: Cardiology

## 2012-12-26 VITALS — BP 128/68 | HR 54 | Ht 75.0 in | Wt 202.8 lb

## 2012-12-26 DIAGNOSIS — E785 Hyperlipidemia, unspecified: Secondary | ICD-10-CM

## 2012-12-26 DIAGNOSIS — R002 Palpitations: Secondary | ICD-10-CM

## 2012-12-26 DIAGNOSIS — Z951 Presence of aortocoronary bypass graft: Secondary | ICD-10-CM

## 2012-12-26 DIAGNOSIS — I10 Essential (primary) hypertension: Secondary | ICD-10-CM

## 2012-12-26 DIAGNOSIS — I2510 Atherosclerotic heart disease of native coronary artery without angina pectoris: Secondary | ICD-10-CM

## 2012-12-26 DIAGNOSIS — R0789 Other chest pain: Secondary | ICD-10-CM

## 2012-12-26 NOTE — Patient Instructions (Addendum)
Everything seems to be stable.   Please keep [place] informed if any of the Exertional shortness of breath or chest discomfort gets worse, or happens with less exertion.  Your Blood Pressure & Heart Rate look good.  As we discussed, we will have you see Corine Shelter, PA in 6 months & me in 1 yr.  Marykay Lex, MD

## 2013-01-04 ENCOUNTER — Encounter: Payer: Self-pay | Admitting: Cardiology

## 2013-01-04 DIAGNOSIS — Z951 Presence of aortocoronary bypass graft: Secondary | ICD-10-CM | POA: Insufficient documentation

## 2013-01-04 DIAGNOSIS — E785 Hyperlipidemia, unspecified: Secondary | ICD-10-CM | POA: Insufficient documentation

## 2013-01-04 DIAGNOSIS — I10 Essential (primary) hypertension: Secondary | ICD-10-CM | POA: Insufficient documentation

## 2013-01-04 DIAGNOSIS — R0789 Other chest pain: Secondary | ICD-10-CM | POA: Insufficient documentation

## 2013-01-04 DIAGNOSIS — I2510 Atherosclerotic heart disease of native coronary artery without angina pectoris: Secondary | ICD-10-CM | POA: Insufficient documentation

## 2013-01-04 DIAGNOSIS — R002 Palpitations: Secondary | ICD-10-CM | POA: Insufficient documentation

## 2013-01-04 NOTE — Progress Notes (Signed)
Patient ID: Daniel [name], male   DOB: 1932-10-17, 79 y.o.   MRN: [id number] PCP: Minda Meo, MD  Clinic Note: Chief Complaint  Patient presents with  . Follow-up    6 month follow up, little SOB at times, dizziness sometimes   HPI: Daniel [name] is a 79 y.o. male with a PMH below who presents today for followup of his long-standing coronary disease. He is a former patient of Dr. Caprice Kluver, whom I met for the first time back in January of this year. His cardiac history dates back to 1997 where he underwent a three-vessel CABG. This was followed up by PCI nongrafted RCA with a bare-metal stent. His most recent catheterization demonstrated left main disease but adequate flow the LAD with an atretic LIMA-LAD graft. He was stable at the time of his last visit. The plan will be to probably repeat a nuclear stress test in about a year from that time which would be January of next year, provided he remains stable. He does have a history of statin intolerance, and his able to Crestor maybe once or twice a week.  Interval History: He comes in today feeling relatively fine. He has no major complaints. He still has some sinus problems left over from his allergies. He notices most of the left side of his head. He always has his baseline shortness of breath that is as usual is his BNP happens after lots of stress after a period he notes mild orthostatic/positional dizziness. He says occasionally he has off and on left-sided chest tightness that comes off and on value usually at rest though, at not associated with exertion. No chest pain or with exertion.   He denies any palpitations, this is an issue from the past. On the beta blocker doing well. No lightheadedness, dizziness except for the mild orthostatic symptoms. No syncope near syncope. No PND, orthopnea or edema. No concerning symptoms of sleep apnea. No TIA or amaurosis fugax symptoms. No claudication symptoms. No melena, hematochezia or  hematuria.  Past Medical History  Diagnosis Date  . CAD, multiple vessel 1997-2010    Last cath 2010: 70-80 % ostial left main, 80-90% LAD, subtotal occluded circumflex, RCA now a patent stent; SVG-diagonal backfills LAD, atretic LIMA-LAD -- medical therapy  . S/P CABG x 3 1997    LIMA-LAD, SVG to diagonal, SVG-OM  . CAD S/P percutaneous coronary angioplasty 2003    PCI nongrafted distal RCA-PDA: Mini vision BMS 2.25 mm x 28 mm  . CAD of autologous arterial graft 2010    Atretic LIMA   . Hypertension   . Dyslipidemia, goal LDL below 70   . Heart palpitations     Never fully diagnosed.  . Insomnia     Prior Cardiac Evaluation and Past Surgical History: Past Surgical History  Procedure Laterality Date  . Coronary artery bypass graft  1997    LIMA - LAD, SVG- diagonal, SVG-OM  . Cardiac catheterization  2003    showed high grade distal RCA and PDA stenosis and underwent PCI with a 2.25 mm x 28 mm Mini Vision bare- metal stent.  . Cardiac catheterization  2010    Native coronaries at 70% to 80% ostial left main. LAD had diffuse 80% to 90% stenosis   Not on File  Current Outpatient Prescriptions  Medication Sig Dispense Refill  . aspirin 81 MG tablet Take 162 mg by mouth daily.      . clopidogrel (PLAVIX) 75 MG tablet Take 75 mg  by mouth daily.      . fluticasone (VERAMYST) 27.5 MCG/SPRAY nasal spray Place 2 sprays into the nose daily.      . isosorbide mononitrate (IMDUR) 30 MG 24 hr tablet Take 30 mg by mouth daily.      [name] lisinopril (PRINIVIL,ZESTRIL) 20 MG tablet Take 20 mg by mouth 2 (two) times daily.      . metoprolol succinate (TOPROL-XL) 50 MG 24 hr tablet Take 1 1/2 in the morning and 1 at night.      . montelukast (SINGULAIR) 10 MG tablet Take 10 mg by mouth at bedtime.      . nitroGLYCERIN (NITROSTAT) 0.4 MG SL tablet Place 1 tablet (0.4 mg total) under the tongue every 5 (five) minutes as needed for chest pain.  25 tablet  3  . rosuvastatin (CRESTOR) 5 MG tablet Take  1 tablet M-W-F      . traMADol (ULTRAM) 50 MG tablet Take 2-4 tablets daily      . zolpidem (AMBIEN) 5 MG tablet Take 5 mg by mouth at bedtime as needed for sleep.       No current facility-administered medications for this visit.   Social History Narrative   He is a married father of 2, grandfather 5. He is retired from [employer]. He travels back and forth between here and [place], [place] where part of his family lives.  He is usually very active, but is limited as far as standing exercise by his knee osteoarthritis.   He does not drink and does not smoke. Never smoked.   ROS: A comprehensive Review of Systems - Negative except Pertinent cardiac positives above. Other non-cardiac concerns noted below. ENT ROS: positive for - headaches, nasal congestion and sinus pain negative for - epistaxis, hearing change, nasal discharge, sneezing, sore throat, tinnitus, vertigo, visual changes or vocal changes Allergy and Immunology ROS: positive for - nasal congestion, postnasal drip and seasonal allergies Mild musculoskeletal joint pains (most notably in the knees) that are better during the summer warmer months. Occasional loose stools and occasional constipation.  PHYSICAL EXAM BP 128/68  Pulse 54  Ht 6\' 3"  (1.905 m)  Wt 202 lb 12.8 oz (91.989 kg)  BMI 25.35 kg/m2 - 4 pound weight gain from last visit General appearance: alert and oriented x3, cooperative, appears stated age, no distress and healthy appearing, pleasant gentleman. Well-nourished and well-groomed. Answers questions appropriately. HEENT: [place]/AT, EOMI, MMM, anicteric sclera; still has mild sinus tenderness Neck: no carotid bruit, no JVD and supple, symmetrical, trachea midline Lungs: clear to auscultation bilaterally, normal percussion bilaterally and nonlabored, good air movement. Mild upper airway interstitial sounds Heart: regular rate and rhythm, S1, S2 normal, no murmur, click, rub or gallop and normal apical impulse Abdomen:  soft, non-tender; bowel sounds normal; no masses,  no organomegaly Extremities: extremities normal, atraumatic, no cyanosis or edema, no edema, redness or tenderness in the calves or thighs, no ulcers, gangrene or trophic changes and mild bruising Pulses: 2+ and symmetric Neurologic: Grossly normal  WUJ:[id number] today: Yes Rate: 54 , Rhythm: Sinus bradycardia, otherwise normal ECG;    Recent Labs: No recent labs  ASSESSMENT / PLAN:  CAD, multiple vessel He continued to be relatively asymptomatic from this. No symptoms concerning for angina. The occasional chest tightness that he has disease at rest, and not with exertion. But sounding more musculoskeletal or or allergy related he continues to be relatively active, but is limited as far as routine exercise due to his knee arthritis.  Plan:  Continue aggressive medical therapy ACE inhibitor, beta blocker, add long-acting nitrate along with dual antiplatelet therapy with aspirin Plavix, and statin. Be due for followup Myoview Stress Test to be ordered at his next visit.  S/P CABG x 3 Unfortunately for him, his LIMA became atretic. However thankfully the vein graft to the diagonal is now providing flow to both the diagonal and down the LAD. He does not have a lot of percutaneous options for the left coronary system, but those graft survival with severe ostial left main stenosis and occluded circumflex.  Plan: Followup with Myoview Stress Test to be ordered next visit in order to evaluate graft patency 5 years out from most recent cath  Hypertension Blood pressure were controlled on beta blocker, ACE inhibitor at max dose and Norvasc.    Dyslipidemia, goal LDL below 70 All along we would try to increase the nighttime he takes his Crestor. His currently taking weekly. We tried again to get perhaps even twice to 3 times a week if possible. He says he willing to try that. His lipids have been followed by his primary physician, and I will  continue to defer that to him. At last check he was doing pretty well but his LDL was still greater than 100.  Heart palpitations Much improved. He is on a staggered dose of beta blocker that seems to be working well for him to eat take the additional half dose in the evening if he notices any more palpitations than usual. He also does is to be feeling more chest tightness at rest.  Feeling of chest tightness - at rest Am not really sure what to make of this symptom.  It never occurs with exertion, and is very fleeting. It is quite possible he has some coronary vasospasm, but a noncardiac etiology is also possible.  Plan: Continue long-acting nitrate and calcium channel blocker for possible vasospasm.    Orders Placed This Encounter  Procedures  . EKG 12-Lead    Followup: Corine Shelter, PA in 6 months (with plan to order Myoview Stress Test, treadmill if possible if not a LexiScan,  Followup with me in 12 months  DAVID W. Herbie Baltimore, M.D., M.S. [facility] [address] [place], [place]  [address]  [phone] Pager # [phone]

## 2013-01-04 NOTE — Assessment & Plan Note (Signed)
Am not really sure what to make of this symptom.  It never occurs with exertion, and is very fleeting. It is quite possible he has some coronary vasospasm, but a noncardiac etiology is also possible.  Plan: Continue long-acting nitrate and calcium channel blocker for possible vasospasm.

## 2013-01-04 NOTE — Assessment & Plan Note (Signed)
Unfortunately for him, his LIMA became atretic. However thankfully the vein graft to the diagonal is now providing flow to both the diagonal and down the LAD. He does not have a lot of percutaneous options for the left coronary system, but those graft survival with severe ostial left main stenosis and occluded circumflex.  Plan: Followup with Myoview Stress Test to be ordered next visit in order to evaluate graft patency 5 years out from most recent cath

## 2013-01-04 NOTE — Assessment & Plan Note (Signed)
Much improved. He is on a staggered dose of beta blocker that seems to be working well for him to eat take the additional half dose in the evening if he notices any more palpitations than usual. He also does is to be feeling more chest tightness at rest.

## 2013-01-04 NOTE — Assessment & Plan Note (Signed)
All along we would try to increase the nighttime he takes his Crestor. His currently taking weekly. We tried again to get perhaps even twice to 3 times a week if possible. He says he willing to try that. His lipids have been followed by his primary physician, and I will continue to defer that to him. At last check he was doing pretty well but his LDL was still greater than 100.

## 2013-01-04 NOTE — Assessment & Plan Note (Signed)
Blood pressure were controlled on beta blocker, ACE inhibitor at max dose and Norvasc.

## 2013-01-04 NOTE — Assessment & Plan Note (Signed)
He continued to be relatively asymptomatic from this. No symptoms concerning for angina. The occasional chest tightness that he has disease at rest, and not with exertion. But sounding more musculoskeletal or or allergy related he continues to be relatively active, but is limited as far as routine exercise due to his knee arthritis.  Plan: Continue aggressive medical therapy ACE inhibitor, beta blocker, add long-acting nitrate along with dual antiplatelet therapy with aspirin Plavix, and statin. Be due for followup Myoview Stress Test to be ordered at his next visit.

## 2013-02-21 DIAGNOSIS — H04209 Unspecified epiphora, unspecified lacrimal gland: Secondary | ICD-10-CM | POA: Insufficient documentation

## 2013-02-21 DIAGNOSIS — H02839 Dermatochalasis of unspecified eye, unspecified eyelid: Secondary | ICD-10-CM | POA: Insufficient documentation

## 2013-05-22 DIAGNOSIS — H0230 Blepharochalasis unspecified eye, unspecified eyelid: Secondary | ICD-10-CM | POA: Insufficient documentation

## 2013-06-20 ENCOUNTER — Ambulatory Visit (INDEPENDENT_AMBULATORY_CARE_PROVIDER_SITE_OTHER): Payer: Medicare PPO | Admitting: Cardiology

## 2013-06-20 ENCOUNTER — Encounter: Payer: Self-pay | Admitting: Cardiology

## 2013-06-20 VITALS — BP 102/68 | HR 60 | Ht 75.0 in | Wt 203.7 lb

## 2013-06-20 DIAGNOSIS — R0789 Other chest pain: Secondary | ICD-10-CM

## 2013-06-20 DIAGNOSIS — I10 Essential (primary) hypertension: Secondary | ICD-10-CM

## 2013-06-20 DIAGNOSIS — I2510 Atherosclerotic heart disease of native coronary artery without angina pectoris: Secondary | ICD-10-CM

## 2013-06-20 DIAGNOSIS — E785 Hyperlipidemia, unspecified: Secondary | ICD-10-CM

## 2013-06-20 DIAGNOSIS — R002 Palpitations: Secondary | ICD-10-CM

## 2013-06-20 NOTE — Assessment & Plan Note (Signed)
Stable since last visit

## 2013-06-20 NOTE — Progress Notes (Signed)
06/20/2013 Daniel [name]   10/20/1932  [id number]  Primary Physicia ARONSON,RICHARD A, MD Primary Cardiologist: Dr Herbie BaltimoreHarding  HPI:  80 y/o with a history of CABG in '97, PCI in 2003, last cath 2010- medical Rx. See Dr Elissa HeftyHarding's note for complete details. He is here for a 6 month check up. Since Dr Herbie BaltimoreHarding saw him he has been doing well. He has occasional chest tightness but does not require NTG.  He has increased his Crestor to 5 mg 3 x week and seems to be tolerating this. He is going to get lipids drawn in February.    Current Outpatient Prescriptions  Medication Sig Dispense Refill  . aspirin 81 MG tablet Take 162 mg by mouth daily.      . clopidogrel (PLAVIX) 75 MG tablet Take 75 mg by mouth daily.      . fluticasone (VERAMYST) 27.5 MCG/SPRAY nasal spray Place 2 sprays into the nose daily.      . isosorbide mononitrate (IMDUR) 30 MG 24 hr tablet Take 30 mg by mouth daily.      [name]. lisinopril (PRINIVIL,ZESTRIL) 20 MG tablet Take 20 mg by mouth 2 (two) times daily.      . metoprolol succinate (TOPROL-XL) 50 MG 24 hr tablet Take 1 1/2 in the morning and 1 at night.      . montelukast (SINGULAIR) 10 MG tablet Take 10 mg by mouth at bedtime.      . nitroGLYCERIN (NITROSTAT) 0.4 MG SL tablet Place 1 tablet (0.4 mg total) under the tongue every 5 (five) minutes as needed for chest pain.  25 tablet  3  . rosuvastatin (CRESTOR) 5 MG tablet Take 1 tablet M-W-F      . traMADol (ULTRAM) 50 MG tablet Take 2-4 tablets daily      . zolpidem (AMBIEN) 10 MG tablet Take 5 mg by mouth at bedtime as needed for sleep.       No current facility-administered medications for this visit.    Allergies  Allergen Reactions  . Penicillins     Swelling and redness in joints  . Statins     At high dose  . Sulfa Antibiotics     Pain in left side underneath ribcage-pancreas    History   Social History  . Marital Status: Married    Spouse Name: N/A    Number of Children: N/A  . Years of Education: N/A    Occupational History  . Not on file.   Social History Main Topics  . Smoking status: Never Smoker   . Smokeless tobacco: Not on file  . Alcohol Use: No  . Drug Use: No  . Sexual Activity: Not on file   Other Topics Concern  . Not on file   Social History Narrative   He is a married father of 2, grandfather 5. He is retired from [employer]. He travels back and forth between here and [place], [place] where part of his family lives.  He is usually very active, but is limited as far as standing exercise by his knee osteoarthritis.   He does not drink and does not smoke. Never smoked.     Review of Systems: General: negative for chills, fever, night sweats or weight changes.  Cardiovascular: negative for chest pain, dyspnea on exertion, edema, orthopnea, palpitations, paroxysmal nocturnal dyspnea or shortness of breath Dermatological: negative for rash Respiratory: negative for cough or wheezing Urologic: negative for hematuria Abdominal: negative for nausea, vomiting, diarrhea, bright red blood per rectum, melena,  or hematemesis Neurologic: negative for visual changes, syncope, or dizziness All other systems reviewed and are otherwise negative except as noted above.    Blood pressure 102/68, pulse 60, height 6\' 3"  (1.905 m), weight 203 lb 11.2 oz (92.398 kg).  General appearance: alert, cooperative and no distress Neck: no carotid bruit Lungs: clear to auscultation bilaterally Heart: regular rate and rhythm  EKG NSR without acute changes  ASSESSMENT AND PLAN:   CAD, multiple vessel CABG '97, Last cath 2010: 70-80 % ostial left main, 80-90% LAD, subtotal occluded circumflex, RCA now a patent stent; SVG-diagonal backfills LAD, atretic LIMA-LAD -- medical therapy  Dyslipidemia, goal LDL below 70 Intolerant to high dose statin. He did increase his Crestor to 3 x week since LOV.  Hypertension Controlled  Heart palpitations Stable since last visit  Feeling of chest  tightness - at rest He has had this off and on    PLAN  Dr Herbie Baltimore had suggested a Myoview, his last cath was in 2010, his last Myoview was in 2005. He has had some chest tightness off an on. We'll get a copy of his lipids when they are done next month. They pt said he would be willing to try going to 5 days a week but I thought we should look at the results first. His LDL when on 5 mg Crestor daily was 104.  Daniel [name] KPA-C 06/20/2013 5:06 PM

## 2013-06-20 NOTE — Assessment & Plan Note (Signed)
CABG '97, Last cath 2010: 70-80 % ostial left main, 80-90% LAD, subtotal occluded circumflex, RCA now a patent stent; SVG-diagonal backfills LAD, atretic LIMA-LAD -- medical therapy

## 2013-06-20 NOTE — Patient Instructions (Signed)
Your physician has requested that you have an exercise myoview. For further information please visit [email or web address]. Please follow instruction sheet, as given. Hold your Toprol the night before the test and the morning of the test. See Dr Herbie BaltimoreHarding in 6 months. Forward your lipid results to us when they are available.

## 2013-06-20 NOTE — Assessment & Plan Note (Signed)
He has had this off and on

## 2013-06-20 NOTE — Assessment & Plan Note (Signed)
Intolerant to high dose statin. He did increase his Crestor to 3 x week since LOV.

## 2013-06-20 NOTE — Assessment & Plan Note (Signed)
Controlled.  

## 2013-06-21 ENCOUNTER — Encounter: Payer: Self-pay | Admitting: Cardiology

## 2013-07-01 ENCOUNTER — Other Ambulatory Visit: Payer: Self-pay | Admitting: Cardiology

## 2013-07-02 NOTE — Telephone Encounter (Signed)
Rx was sent to pharmacy electronically. 

## 2013-07-03 ENCOUNTER — Ambulatory Visit (HOSPITAL_COMMUNITY)
Admission: RE | Admit: 2013-07-03 | Discharge: 2013-07-03 | Disposition: A | Discharge status: Home / Self Care | Payer: Medicare PPO | Source: Ambulatory Visit | Attending: Cardiology | Admitting: Cardiology

## 2013-07-03 DIAGNOSIS — R0789 Other chest pain: Secondary | ICD-10-CM | POA: Insufficient documentation

## 2013-07-03 DIAGNOSIS — I2510 Atherosclerotic heart disease of native coronary artery without angina pectoris: Secondary | ICD-10-CM | POA: Insufficient documentation

## 2013-07-03 MED ORDER — TECHNETIUM TC 99M SESTAMIBI GENERIC - CARDIOLITE
10.8000 | Freq: Once | INTRAVENOUS | Status: AC | PRN
  Administered 2013-07-03: 11 via INTRAVENOUS

## 2013-07-03 MED ORDER — TECHNETIUM TC 99M SESTAMIBI GENERIC - CARDIOLITE
30.9000 | Freq: Once | INTRAVENOUS | Status: AC | PRN
  Administered 2013-07-03: 30.9 via INTRAVENOUS

## 2013-07-03 NOTE — Procedures (Addendum)
[facility] [address] [place] [address] [phone]  Cardiology Nuclear Med Study  Daniel [name] is a 80 y.o. male     MRN : [id number]     DOB: 10/11/1932  Procedure Date: 07/03/2013  Nuclear Med Background Indication for Stress Test:  Graft Patency and Stent Patency History:  CAD;CABG X3-1997;STENT/PTCA-2003 Cardiac Risk Factors: Family History - CAD, Hypertension and Lipids  Symptoms:  Chest Pain, Fatigue, Light-Headedness and Palpitations   Nuclear Pre-Procedure Caffeine/Decaff Intake:  1:00am NPO After: 11am   IV Site: R Forearm  IV 0.9% NS with Angio Cath:  22g  Chest Size (in):  42"  IV Started by: Emmit PomfretAmanda Hicks, RN  Height: 6\' 3"  (1.905 m)  Cup Size: n/a  BMI:  Body mass index is 25.37 kg/(m^2). Weight:  203 lb (92.08 kg)   Tech Comments:  n/a    Nuclear Med Study 1 or 2 day study: 1 day  Stress Test Type:  Stress  Order Authorizing Provider:  Bryan Lemmaavid Harding, MD   Resting Radionuclide: Technetium 4154m Sestamibi  Resting Radionuclide Dose: 10.8 mCi   Stress Radionuclide:  Technetium 8554m Sestamibi  Stress Radionuclide Dose: 30.9 mCi           Stress Protocol Rest HR: 78 Stress HR: 162  Rest BP: 175/83 Stress BP: 214/119  Exercise Time (min): 4 METS: 5.8   Predicted Max HR: 142 bpm % Max HR: 114.08 bpm Rate Pressure Product: [id number]  Dose of Adenosine (mg):  n/a Dose of Lexiscan: n/a mg  Dose of Atropine (mg): n/a Dose of Dobutamine: n/a mcg/kg/min (at max HR)  Stress Test Technologist: Esperanza Sheetserry-Marie Martin, CCT Nuclear Technologist: Gonzella LexPam Phillips, CNMT   Rest Procedure:  Myocardial perfusion imaging was performed at rest 45 minutes following the intravenous administration of Technetium 8554m Sestamibi. Stress Procedure:  The patient performed treadmill exercise using a Bruce  Protocol for 4 minutes. The patient stopped due to Diastolic HTN, marked SOB and Fatigue and denied any chest pain.  There were no  significant ST-T wave changes.  Technetium 1254m Sestamibi was injected at peak exercise and myocardial perfusion imaging was performed after a brief delay.  Transient Ischemic Dilatation (Normal <1.22):  0.78 Lung/Heart Ratio (Normal <0.45):  0.29 QGS EDV:  69 ml QGS ESV:  24 ml LV Ejection Fraction: 65%  Signed by     Rest ECG: NSR - Normal EKG  Stress ECG: Upsloping ST depression  QPS Raw Data Images:  Normal; no motion artifact; normal heart/lung ratio. Stress Images:  Normal homogeneous uptake in all areas of the myocardium. Rest Images:  Normal homogeneous uptake in all areas of the myocardium. Subtraction (SDS):  No evidence of ischemia.  Impression Exercise Capacity:  Fair exercise capacity. BP Response:  Normal blood pressure response. Clinical Symptoms:  No significant symptoms noted. ECG Impression:  No significant ST segment change suggestive of ischemia. Comparison with Prior Nuclear Study: No images to compare  Overall Impression:  Normal stress nuclear study.  LV Wall Motion:  NL LV Function; NL Wall Motion   Runell GessBERRY,Meliza Kage J, MD  07/03/2013 5:46 PM

## 2013-07-05 ENCOUNTER — Telehealth: Payer: Self-pay | Admitting: *Deleted

## 2013-07-05 NOTE — Telephone Encounter (Signed)
Spoke to patient. Result given . Verbalized understanding  

## 2013-07-05 NOTE — Telephone Encounter (Signed)
Message copied by Tobin ChadMARTIN, Halee Glynn V. on Thu Jul 05, 2013 11:33 AM ------      Message from: Herbie BaltimoreHARDING, DAVID W      Created: Thu Jul 05, 2013  7:26 AM       Great News !!.  No sign of ischemia or infarction.      Grafts & stents seem to be doing the job!![name].            Marykay LexHARDING,DAVID W, MD       ------

## 2013-10-30 ENCOUNTER — Other Ambulatory Visit: Payer: Self-pay | Admitting: Cardiology

## 2013-10-30 NOTE — Telephone Encounter (Signed)
Rx was sent to pharmacy electronically. 

## 2013-12-03 ENCOUNTER — Ambulatory Visit: Payer: Medicare PPO | Admitting: Cardiology

## 2013-12-12 ENCOUNTER — Ambulatory Visit (INDEPENDENT_AMBULATORY_CARE_PROVIDER_SITE_OTHER): Payer: Medicare PPO | Admitting: Cardiology

## 2013-12-12 VITALS — BP 134/78 | HR 55 | Ht 75.0 in | Wt 201.2 lb

## 2013-12-12 DIAGNOSIS — I10 Essential (primary) hypertension: Secondary | ICD-10-CM

## 2013-12-12 DIAGNOSIS — I2510 Atherosclerotic heart disease of native coronary artery without angina pectoris: Secondary | ICD-10-CM

## 2013-12-12 DIAGNOSIS — I951 Orthostatic hypotension: Secondary | ICD-10-CM

## 2013-12-12 DIAGNOSIS — R0789 Other chest pain: Secondary | ICD-10-CM

## 2013-12-12 DIAGNOSIS — E785 Hyperlipidemia, unspecified: Secondary | ICD-10-CM

## 2013-12-12 DIAGNOSIS — Z951 Presence of aortocoronary bypass graft: Secondary | ICD-10-CM

## 2013-12-12 DIAGNOSIS — R002 Palpitations: Secondary | ICD-10-CM

## 2013-12-12 NOTE — Patient Instructions (Addendum)
IF PALP. OCCURS ,YOU MAY TAKE AN EXTRA 1/2 TABLET OF METOPROLOL AT NIGHT IF YOU TAKE AN EXTRA TABLET ,DO NOT TAKE YOUR LISINOPRIL THAT EVENING .  TRY TO TAKE YOUR METOPROLOL AT 10 AM  AND 10 PM EACH DAY.  Your physician wants you to follow-up in 6 MONTHS LUKE [facility]ILRO[facility].  You will receive a reminder letter in the mail two months in advance. If you don't receive a letter, please call our office to schedule the follow-up appointment.    Your physician wants you to follow-up in 12 MONTHS DR HARDING.  You will receive a reminder letter in the mail two months in advance. If you don't receive a letter, please call our office to schedule the follow-up appointment.

## 2013-12-17 ENCOUNTER — Encounter: Payer: Self-pay | Admitting: Cardiology

## 2013-12-17 DIAGNOSIS — I951 Orthostatic hypotension: Secondary | ICD-10-CM | POA: Insufficient documentation

## 2013-12-17 NOTE — Assessment & Plan Note (Signed)
4, his stress test was relatively normal. With that L. in the proximal portion of the LAD only getting perfusion via retrograde flow from the vein graft to the diagonal, it is quite possible that he may indeed have mild basal septal ischemia but is relatively insignificant, especially the lack of any significant symptoms.  Plan: Continue aspirin plus Plavix for secondary prevention as well as his beta blocker and ACE inhibitor. He is taking a statin which was adjusting.

## 2013-12-17 NOTE — Assessment & Plan Note (Signed)
Not really sure what these episodes are. I told him that he has longer lasting, more symptomatic symptoms he can take an additional one half tablet of metoprolol succinate in the evening for additional coverage. If he does this he is to hold his p.m. dose of ACE inhibitor.

## 2013-12-17 NOTE — Assessment & Plan Note (Addendum)
Did not quite tolerate 3 times a week Crestor. Intermittently doing 3 and 2 times a week.  Labs are being followed by PCP -LEO January was still only in the 90s. Hopefully we can gradually for further, but for now dietary modification as well as exercise is recommended. These should be improving with his significant weight loss already. Could also consider addition of Zetia.

## 2013-12-17 NOTE — Assessment & Plan Note (Signed)
Off-and-on symptoms. This could be related to the septal region or microvascular ischemia. Continue when necessary nitroglycerin if they last long enough.

## 2013-12-17 NOTE — Assessment & Plan Note (Signed)
Atretic LIMA with SVG-diagonal perfusing LAD

## 2013-12-17 NOTE — Assessment & Plan Note (Signed)
Controlled on current regimen.   

## 2013-12-17 NOTE — Assessment & Plan Note (Signed)
Usually this is happening shortly after taking his Toprol an ACE inhibitor combined in the morning. My instructions for him were as follows: IF PALP. OCCURS ,YOU MAY TAKE AN EXTRA 1/2 TABLET OF METOPROLOL AT NIGHT IF YOU TAKE AN EXTRA TABLET ,DO NOT TAKE YOUR LISINOPRIL THAT EVENING .  TRY TO TAKE YOUR METOPROLOL AT 10 AM  AND 10 PM EACH DAY

## 2013-12-17 NOTE — Progress Notes (Signed)
PCP: Minda MeoARONSON,RICHARD A, MD  Clinic Note: Chief Complaint  Patient presents with  . Follow-up    1 year follow-up, pt c/o chest tightness and tiredness, skip heart beat   HPI: Daniel [name] is a 80 y.o. male with a Cardiovascular Problem List below who presents today for routine 6 month follow-up.  He last saw me 1 yr ago, but saw [name], [place] in Jan 2015 and was was doing well.  Only noted intermittent "angina" spells that do not last long enough for him to take a SL NTG tab.  He was doing fine with increased dosing of Crestor to 5mg  3 x / wk.   He had a routine follow-up Myoview to assess for progression of CAD that was negative for ischemia or infarction (with mildly reduced basal septal defect that was felt to be more consistent with artifact, but could represent early septal ischemia from an occluded LAD with inadequate retrograde perfusion through the graft.  Interval History: This is, he says he is really getting older and gets tired easier than he used to. Yancey Flem[place] doing yard work it occurs around [employer] but this is a somewhat diabetes 2. He says he really hasn't skipped beats every other day. There. Consistency in duration. They never last more than a few seconds at a time don't cause any lightheadedness or dizziness. It only makes him feel anything more than just uncomfortable. He says that he had intermittent episodes of chest discomfort but has not felt like he needs to use nitroglycerin because they did not last long.  he denies any exertional chest discomfort or significant dyspnea. The remainder his cardiac review of systems is as follows:  No chest pain with rest or exertion. No PND, orthopnea or edema.  No lightheadedness, dizziness, weakness or syncope/near syncope. No TIA/amaurosis fugax symptoms. No melena, hematochezia, hematuria, or epstaxis. No claudication.  Past Medical History  Diagnosis Date  . Coronary artery disease involving left main coronary artery  1997-2010    Last cath 2010: 70-80 % ostial left main, 80-90% LAD, subtotal occluded circumflex, RCA now a patent stent; SVG-diagonal backfills LAD, atretic LIMA-LAD -- medical therapy  . S/P CABG x 3 1997    LIMA-LAD, SVG to diagonal, SVG-OM  . CAD S/P percutaneous coronary angioplasty 2003    PCI nongrafted distal RCA-PDA: Mini vision BMS 2.25 mm x 28 mm  . CAD of autologous arterial graft 2010    Atretic LIMA; Myoview Jan 2015: Possible mild basal anteroseptal defect thought to be artifact (CRO early LAD ischemia due to inadeuate retrograde perfusion via SVG-Diag  . Hypertension   . Dyslipidemia, goal LDL below 70   . Heart palpitations     Never fully diagnosed.  . Insomnia     Prior Cardiac Evaluation and Past Surgical History: Past Surgical History  Procedure Laterality Date  . Coronary artery bypass graft  1997    LIMA - LAD, SVG- diagonal, SVG-OM  . Cardiac catheterization  2003    showed high grade distal RCA and PDA stenosis and underwent PCI with a 2.25 mm x 28 mm Mini Vision bare- metal stent.  . Cardiac catheterization  2010    Native coronaries at 70% to 80% ostial left main. LAD had diffuse 80% to 90% stenosis   MEDICATIONS AND ALLERGIES REVIEWED IN EPIC No Change in Social and Family History  ROS: A comprehensive Review of Systems - Negative except As noted in history of present illness. He also notes that he  just hasn't been eating much as he had in the past and has lost about 35 pounds in the past 3 years. he does note some occasional morning orthostatic hypotension symptoms. Mild musculoskeletal joint pains (most notably in the knees) that are better during the summer warmer months. Occasional loose stools and occasional constipation  Wt Readings from Last 3 Encounters:  12/12/13 201 lb 3.2 oz (91.264 kg)  07/03/13 203 lb (92.08 kg)  06/20/13 203 lb 11.2 oz (92.398 kg)    PHYSICAL EXAM BP 134/78  Pulse 55  Ht 6\' 3"  (1.905 m)  Wt 201 lb 3.2 oz (91.264 kg)   BMI 25.15 kg/m2 General appearance: alert and oriented x3, cooperative, appears stated age, no distress and healthy appearing, pleasant gentleman. Well-nourished and well-groomed. Answers questions appropriately.  HEENT: [place]/AT, EOMI, MMM, anicteric sclera; still has mild sinus tenderness  Neck: no carotid bruit, no JVD and supple, symmetrical, trachea midline  Lungs: clear to auscultation bilaterally, normal percussion bilaterally and nonlabored, good air movement. Mild upper airway interstitial sounds  Heart: regular rate and rhythm, S1, S2 normal, no murmur, click, rub or gallop and normal apical impulse  Abdomen: soft, non-tender; bowel sounds normal; no masses, no organomegaly  Extremities: extremities normal, atraumatic, no cyanosis or edema, no edema, redness or tenderness in the calves or thighs, no ulcers, gangrene or trophic changes and mild bruising  Pulses: 2+ and symmetric  Neurologic: Grossly normal   Adult ECG Report  Rate: 55 ;  Rhythm: sinus bradycardia; Non-specific ST-T wave changes in V1 & V2 -- relatively stable EKG  Recent Labs: From [facility] Jan 2015  TC 149, TG 104, LDL 93, HDL 36.  Chemistry panel normal except for glucose of 99. CBC essentially normal.  TSH 0.07 with a free T4 of 1.0.  Not truly hypothyroid  ASSESSMENT / PLAN: Coronary artery disease involving left main coronary artery 4, his stress test was relatively normal. With that L. in the proximal portion of the LAD only getting perfusion via retrograde flow from the vein graft to the diagonal, it is quite possible that he may indeed have mild basal septal ischemia but is relatively insignificant, especially the lack of any significant symptoms.  Plan: Continue aspirin plus Plavix for secondary prevention as well as his beta blocker and ACE inhibitor. He is taking a statin which was adjusting.  S/P CABG x 3 Atretic LIMA with SVG-diagonal perfusing LAD  Feeling of chest tightness - at  rest Off-and-on symptoms. This could be related to the septal region or microvascular ischemia. Continue when necessary nitroglycerin if they last long enough.  Heart palpitations Not really sure what these episodes are. I told him that he has longer lasting, more symptomatic symptoms he can take an additional one half tablet of metoprolol succinate in the evening for additional coverage. If he does this he is to hold his p.m. dose of ACE inhibitor.  Dyslipidemia, goal LDL below 70 Did not quite tolerate 3 times a week Crestor. Intermittently doing 3 and 2 times a week.  Labs are being followed by PCP -LEO January was still only in the 90s. Hopefully we can gradually for further, but for now dietary modification as well as exercise is recommended. These should be improving with his significant weight loss already. Could also consider addition of Zetia.   Essential hypertension Controlled on current regimen.  Orthostatic hypotension Usually this is happening shortly after taking his Toprol an ACE inhibitor combined in the morning. My instructions for  him were as follows: IF PALP. OCCURS ,YOU MAY TAKE AN EXTRA 1/2 TABLET OF METOPROLOL AT NIGHT IF YOU TAKE AN EXTRA TABLET ,DO NOT TAKE YOUR LISINOPRIL THAT EVENING .  TRY TO TAKE YOUR METOPROLOL AT 10 AM  AND 10 PM EACH DAY    Orders Placed This Encounter  Procedures  . EKG 12-Lead   Meds ordered this encounter  Medications  . azelastine (ASTELIN) 0.1 % nasal spray    Sig: Place 1 spray into both nostrils at bedtime. Use in each nostril as directed    Followup: 6 months with Mr. Diona Fanti, 12 months with Dr. Herbie Baltimore.  DAVID W. Herbie Baltimore, M.D., M.S. Interventional Cardiologist [facility]

## 2014-08-13 ENCOUNTER — Ambulatory Visit (INDEPENDENT_AMBULATORY_CARE_PROVIDER_SITE_OTHER): Payer: Medicare PPO | Admitting: Cardiology

## 2014-08-13 ENCOUNTER — Encounter: Payer: Self-pay | Admitting: Cardiology

## 2014-08-13 VITALS — BP 104/62 | HR 55 | Ht 75.0 in | Wt 203.8 lb

## 2014-08-13 DIAGNOSIS — Z951 Presence of aortocoronary bypass graft: Secondary | ICD-10-CM

## 2014-08-13 DIAGNOSIS — I10 Essential (primary) hypertension: Secondary | ICD-10-CM

## 2014-08-13 DIAGNOSIS — E785 Hyperlipidemia, unspecified: Secondary | ICD-10-CM

## 2014-08-13 DIAGNOSIS — R002 Palpitations: Secondary | ICD-10-CM

## 2014-08-13 DIAGNOSIS — R946 Abnormal results of thyroid function studies: Secondary | ICD-10-CM

## 2014-08-13 DIAGNOSIS — I2510 Atherosclerotic heart disease of native coronary artery without angina pectoris: Secondary | ICD-10-CM

## 2014-08-13 DIAGNOSIS — R7989 Other specified abnormal findings of blood chemistry: Secondary | ICD-10-CM | POA: Insufficient documentation

## 2014-08-13 MED ORDER — NITROGLYCERIN 0.4 MG SL SUBL: 0.4000 mg | SUBLINGUAL_TABLET | SUBLINGUAL | Status: DC | PRN

## 2014-08-13 NOTE — Assessment & Plan Note (Signed)
LIMA-LAD, SVG to diagonal, SVG-OM '97, RCA PCI 2003, cath 2010- medical Rx. Myoview Jan 2015. Low risk.

## 2014-08-13 NOTE — Assessment & Plan Note (Signed)
B/P now trending low with mild orthostatic symptoms

## 2014-08-13 NOTE — Assessment & Plan Note (Signed)
Followed by PCP

## 2014-08-13 NOTE — Progress Notes (Signed)
08/13/2014 Daniel [name]   09-25-1932  [id number]  Primary Physician [name],Daniel A, MD Primary Cardiologist: Dr Daniel [name]  HPI:  81 y/o with a history of CABG in '97, PCI in 2003, last cath 2010- medical Rx. He had a low risk Myoview Jan 2015. He is here for a 6 month check up. Since Dr Daniel [name] saw him he has been doing well. He has occasional chest tightness but does not require NTG.He has occasional orthostatic symptoms but has not had syncope. He has noted his B/P has been in the 90s systolic at home and he cut his Lisinopril back to 20 mg daily a couple of weeks ago. He is still having intermittent low readings  He has increased his Crestor to 5 mg 3 x week and seems to be tolerating this. He had labs drawn in jan 2016 and this revealed an LDL of 84.He is noted to have a low TSH- 0.18, but says Dr Daniel [name] knows about this.   Current Outpatient Prescriptions  Medication Sig Dispense Refill  . aspirin 81 MG tablet Take 81 mg by mouth daily.     [name] lisinopril (PRINIVIL,ZESTRIL) 20 MG tablet Take 10 mg by mouth daily.    . ASTEPRO 0.15 % SOLN Place 1 spray into the nose at bedtime.  0  . azelastine (ASTELIN) 0.1 % nasal spray Place 1 spray into both nostrils at bedtime. Use in each nostril as directed    . clopidogrel (PLAVIX) 75 MG tablet Take 75 mg by mouth daily.    . isosorbide mononitrate (IMDUR) 30 MG 24 hr tablet Take 30 mg by mouth daily.    . metoprolol succinate (TOPROL-XL) 50 MG 24 hr tablet Take 1 1/2 in the morning and 1 at night.    . montelukast (SINGULAIR) 10 MG tablet Take 10 mg by mouth at bedtime.    . nitroGLYCERIN (NITROSTAT) 0.4 MG SL tablet Place 1 tablet (0.4 mg total) under the tongue every 5 (five) minutes as needed for chest pain. 25 tablet 4  . QNASL 80 MCG/ACT AERS Place 1 spray into both nostrils 2 (two) times daily.  0  . rosuvastatin (CRESTOR) 5 MG tablet Take 1 tablet (5 mg total) by mouth 3 (three) times a week. 54 tablet 3  . tamsulosin (FLOMAX) 0.4 MG  CAPS capsule Take 1 capsule by mouth every morning.  0  . traMADol (ULTRAM) 50 MG tablet Take 2-4 tablets daily    . zolpidem (AMBIEN) 10 MG tablet Take 5 mg by mouth at bedtime as needed for sleep.     No current facility-administered medications for this visit.    Allergies  Allergen Reactions  . Penicillins     Swelling and redness in joints  . Statins     At high dose  . Sulfa Antibiotics     Pain in left side underneath ribcage-pancreas    History   Social History  . Marital Status: Married    Spouse Name: N/A  . Number of Children: N/A  . Years of Education: N/A   Occupational History  . Not on file.   Social History Main Topics  . Smoking status: Never Smoker   . Smokeless tobacco: Not on file  . Alcohol Use: No  . Drug Use: No  . Sexual Activity: Not on file   Other Topics Concern  . Not on file   Social History Narrative   He is a married father of 2, grandfather 5. He is retired from  [employer]. He travels back and forth between here and [place], [place] where part of his family lives.  He is usually very active, but is limited as far as standing exercise by his knee osteoarthritis.   He does not drink and does not smoke. Never smoked.     Review of Systems: He has lost 30 lbs over 3 years but he says this has stabilized. He denes any trouble sleeping, hair loss, or problems with her appetite.  General: negative for chills, fever, night sweats or weight changes.  Cardiovascular: negative for chest pain, dyspnea on exertion, edema, orthopnea, palpitations, paroxysmal nocturnal dyspnea or shortness of breath Dermatological: negative for rash Respiratory: negative for cough or wheezing Urologic: negative for hematuria Abdominal: negative for nausea, vomiting, diarrhea, bright red blood per rectum, melena, or hematemesis Neurologic: negative for visual changes, syncope, or dizziness All other systems reviewed and are otherwise negative except as noted  above.    Blood pressure 104/62, pulse 55, height 6\' 3"  (1.905 m), weight 203 lb 12.8 oz (92.443 kg).  General appearance: alert, cooperative and no distress Neck: no carotid bruit and no JVD Lungs: clear to auscultation bilaterally Heart: regular rate and rhythm Extremities: no edema  EKG NSR, SB 55  ASSESSMENT AND PLAN:   S/P CABG x 3 LIMA-LAD, SVG to diagonal, SVG-OM '97, RCA PCI 2003, cath 2010- medical Rx. Myoview Jan 2015. Low risk.   Essential hypertension B/P now trending low with mild orthostatic symptoms   Dyslipidemia, goal LDL below 70 LDL 84 Jan 2016 on Crestor 3 x week   Low TSH level Followed by PCP    PLAN  I cut his lisinopril back further to 10 mg daily. He can f/u with Dr Daniel BaltimoreHarding in 6 months.  Daniel [name] KPA-C 08/13/2014 1:51 PM

## 2014-08-13 NOTE — Assessment & Plan Note (Signed)
LDL 84 Jan 2016 on Crestor 3 x week

## 2014-08-13 NOTE — Patient Instructions (Signed)
Your physician has recommended you make the following change in your medication: ut the lisinopril into 1/2. Take 1/2 tablet daily (10 mg)  Your physician wants you to follow-up in: 6 months or sooner if needed with Dr. Herbie BaltimoreHarding. You will receive a reminder letter in the mail two months in advance. If you don't receive a letter, please call our office to schedule the follow-up appointment.

## 2014-08-14 ENCOUNTER — Encounter: Payer: Self-pay | Admitting: Cardiology

## 2014-10-06 ENCOUNTER — Other Ambulatory Visit: Payer: Self-pay | Admitting: Cardiology

## 2015-02-21 ENCOUNTER — Ambulatory Visit: Payer: Medicare PPO | Admitting: Cardiology

## 2015-03-10 ENCOUNTER — Ambulatory Visit: Payer: Medicare PPO | Admitting: Cardiology

## 2015-03-19 ENCOUNTER — Other Ambulatory Visit: Payer: Self-pay | Admitting: Allergy and Immunology

## 2015-03-31 ENCOUNTER — Other Ambulatory Visit: Payer: Self-pay | Admitting: Allergy and Immunology

## 2015-04-03 ENCOUNTER — Ambulatory Visit (INDEPENDENT_AMBULATORY_CARE_PROVIDER_SITE_OTHER): Payer: Medicare PPO | Admitting: Cardiology

## 2015-04-03 ENCOUNTER — Encounter: Payer: Self-pay | Admitting: Cardiology

## 2015-04-03 VITALS — BP 120/68 | HR 74 | Ht 75.0 in | Wt 203.1 lb

## 2015-04-03 DIAGNOSIS — I10 Essential (primary) hypertension: Secondary | ICD-10-CM

## 2015-04-03 DIAGNOSIS — E785 Hyperlipidemia, unspecified: Secondary | ICD-10-CM | POA: Diagnosis not present

## 2015-04-03 DIAGNOSIS — I2510 Atherosclerotic heart disease of native coronary artery without angina pectoris: Secondary | ICD-10-CM

## 2015-04-03 DIAGNOSIS — R002 Palpitations: Secondary | ICD-10-CM

## 2015-04-03 DIAGNOSIS — Z951 Presence of aortocoronary bypass graft: Secondary | ICD-10-CM | POA: Diagnosis not present

## 2015-04-03 DIAGNOSIS — I951 Orthostatic hypotension: Secondary | ICD-10-CM

## 2015-04-03 MED ORDER — ROSUVASTATIN CALCIUM 10 MG PO TABS: 10.0000 mg | ORAL_TABLET | ORAL | Status: DC

## 2015-04-03 MED ORDER — LISINOPRIL 10 MG PO TABS: 10.0000 mg | ORAL_TABLET | Freq: Every day | ORAL | Status: DC

## 2015-04-03 NOTE — Patient Instructions (Signed)
DECREASE LISINOPRIL TO 10 MG DAILY. YOU MAY STILL HOLD LISINOPRIL IF YOU BECOME DIZZY.  TRY TAKING 10 MG CRESTOR 3 TIMES A WEEK - IF ABLE TO  TOLERATE -  CALL OFFICE WE CAN REFILL MEDICATION   NO OTHER CHANGES.   Your physician recommends that you schedule a follow-up appointment in 6 MONTH WITH LUKE StantonKILROY PA.   Your physician wants you to follow-up in 12 MONTHS WITH DR HARDING.  You will receive a reminder letter in the mail two months in advance. If you don't receive a letter, please call our office to schedule the follow-up appointment.

## 2015-04-03 NOTE — Progress Notes (Signed)
PCP: Minda Meo, MD  Clinic Note: Chief Complaint  Patient presents with  . Follow-up    6 mo//pt c/o SOB on exertion and BP dropping causing him to get lightheaded  . Coronary Artery Disease    HPI: Daniel [name] is a 81 y.o. male with a PMH below who presents today for ~6 month f/u for CAD with chronic Angina.  CABG in 1997 (pt of Dr. Caprice Kluver) - Last cath 2010: 70-80 % ostial left main, 80-90% LAD, subtotal occluded circumflex, RCA now a patent stent (had not been grafted); SVG-diagonal backfills LAD, atretic LIMA-LAD -- medical therapy  MIR FULLILOVE was last seen in March 2016 by Mr. Diona Fanti, [place] -- occasional CP (no NTG requirement) ACE-I was cut back to  for hypotensions.  Crestor increased to 5 mg 3x/wk --LDL Jan 2016 84. Low TSH - managed by Dr. Jacky Kindle.  Recent Hospitalizations: n/a  Studies Reviewed:   Myoview Jan 2015: Normal Nuclear Stress Test - No evidence of Ischemia or Infarction. Normal LV Function & wall motion.  Interval History: [name] presents today for routine followup doing quite well. He still does everything he wants to do, he just takes him longer to do it. He just tires out a little easy to used to, especially in the summertime when is hot. He had some issues with cramping, and Haldol plus Crestor for a while but that did not change so is back taking the Crestor now exactly one to see if he could potentially take his wife's 10 mg tablets.  He states that his blood pressure would drop to make you feel it is on occasion. During his last visit, the plan was for him to reduce to 10 mg tablets, but he did not understand that and still trying to use a 20 mg tablets of lisinopril. He is only tolerate one half tablet lisinopril,, in which case he does not have dizziness and. Does have some baseline exertional dyspnea, but is relatively stable.  No chest pain or worsening shortness of breath with rest or exertion.  No PND, orthopnea or edema.  No  palpitations, lightheadedness, dizziness, weakness or syncope/near syncope. No TIA/amaurosis fugax symptoms. No claudication.  ROS: A comprehensive was performed. Review of Systems  Constitutional: Negative for malaise/fatigue.  HENT: Negative for nosebleeds.   Respiratory: Negative for cough and shortness of breath.   Cardiovascular: Negative for claudication.  Gastrointestinal: Negative for blood in stool and melena.  Genitourinary: Negative for hematuria.  Musculoskeletal: Negative for joint pain (Just normal routine arthralgias) and falls.  Neurological: Positive for dizziness (Positional).  Endo/Heme/Allergies: Bruises/bleeds easily.  All other systems reviewed and are negative.   Past Medical History  Diagnosis Date  . Coronary artery disease involving left main coronary artery 1997-2010    Last cath 2010: 70-80 % ostial left main, 80-90% LAD, subtotal occluded circumflex, RCA now a patent stent; SVG-diagonal backfills LAD, atretic LIMA-LAD -- medical therapy  . S/P CABG x 3 1997    LIMA-LAD, SVG to diagonal, SVG-OM  . CAD S/P percutaneous coronary angioplasty 2003    PCI nongrafted distal RCA-PDA: Mini vision BMS 2.25 mm x 28 mm  . CAD of autologous arterial graft 2010    Atretic LIMA; Myoview Jan 2015: Possible mild basal anteroseptal defect thought to be artifact (CRO early LAD ischemia due to inadeuate retrograde perfusion via SVG-Diag  . Hypertension   . Dyslipidemia, goal LDL below 70   . Heart palpitations     Never fully  diagnosed.  . Insomnia     Past Surgical History  Procedure Laterality Date  . Coronary artery bypass graft  2/191997    LIMA - LAD, SVG- diagonal, SVG-OM  . Cardiac catheterization  2003    showed high grade distal RCA and PDA stenosis and underwent PCI with a 2.25 mm x 28 mm Mini Vision bare- metal stent.  . Cardiac catheterization  2010    Native coronaries at 70% to 80% ostial left main. LAD had diffuse 80% to 90% stenosis  . Nm myoview  ltd  02/25/2004; Jan 2015    a) EF 57%; b)  Normal Nuclear Stress Test - No evidence of Ischemia or Infarction. Normal LV Function & wall motion.  . Cardiac catheterization  11/13/2008  . Cardiac catheterization  11/14/2006   Prior to Admission medications   Medication Sig Start Date End Date Taking? Authorizing Provider  aspirin 81 MG tablet Take 81 mg by mouth daily.     Historical Provider, MD  ASTEPRO 0.15 % SOLN Place 1 spray into the nose at bedtime. 07/26/14   Historical Provider, MD  azelastine (ASTELIN) 0.1 % nasal spray Place 1 spray into both nostrils at bedtime. Use in each nostril as directed    Historical Provider, MD  clopidogrel (PLAVIX) 75 MG tablet Take 75 mg by mouth daily.    Historical Provider, MD  isosorbide mononitrate (IMDUR) 30 MG 24 hr tablet Take 30 mg by mouth daily.    Historical Provider, MD  lisinopril (PRINIVIL,ZESTRIL) 20 MG tablet Take 10 mg by mouth daily.    Historical Provider, MD  metoprolol succinate (TOPROL-XL) 50 MG 24 hr tablet Take 1 1/2 (75mg ) in the morning and 1 (50mg ) at night.    Historical Provider, MD  montelukast (SINGULAIR) 10 MG tablet Take 10 mg by mouth at bedtime.    Historical Provider, MD  nitroGLYCERIN (NITROSTAT) 0.4 MG SL tablet Place 1 tablet (0.4 mg total) under the tongue every 5 (five) minutes as needed for chest pain. 08/13/14   Eda PaschalLuke K Kilroy, PA-C  QNASL 80 MCG/ACT AERS Place 1 spray into both nostrils 2 (two) times daily. 06/24/14   Historical Provider, MD  rosuvastatin (CRESTOR) 5 MG tablet Take 1 tablet (5 mg total) by mouth daily at 6 PM. Please make appointment for future refills. 10/07/14   Marykay Lexavid W Lonnette Shrode, MD  tamsulosin (FLOMAX) 0.4 MG CAPS capsule Take 1 capsule by mouth at bedtime.  06/08/14   Historical Provider, MD  traMADol (ULTRAM) 50 MG tablet Take 2-4 tablets daily    Historical Provider, MD  zolpidem (AMBIEN) 10 MG tablet Take 5 mg by mouth at bedtime as needed for sleep.    Historical Provider, MD   Allergies  Allergen  Reactions  . Penicillins     Swelling and redness in joints  . Statins     At high dose  . Sulfa Antibiotics     Pain in left side underneath ribcage-pancreas    Social History   Social History  . Marital Status: Married    Spouse Name: N/A  . Number of Children: N/A  . Years of Education: N/A   Social History Main Topics  . Smoking status: Never Smoker   . Smokeless tobacco: None  . Alcohol Use: No  . Drug Use: No  . Sexual Activity: Not Asked   Other Topics Concern  . None   Social History Narrative   He is a married father of 2, grandfather 5. He is retired  from [employer]. He travels back and forth between here and [place], [place] where part of his family lives.  He is usually very active, but is limited as far as standing exercise by his knee osteoarthritis.   He does not drink and does not smoke. Never smoked.   History reviewed. No pertinent family history.  Wt Readings from Last 3 Encounters:  04/03/15 203 lb 1.6 oz (92.126 kg)  08/13/14 203 lb 12.8 oz (92.443 kg)  12/12/13 201 lb 3.2 oz (91.264 kg)    PHYSICAL EXAM BP 120/68 mmHg  Pulse 74  Ht  (1.905 m)  Wt 203 lb 1.6 oz (92.126 kg)  BMI 25.39 kg/m2 General appearance: alert and oriented x3, cooperative, appears stated age, no distress and healthy appearing, pleasant gentleman. Well-nourished and well-groomed. Answers questions appropriately.  HEENT: [place]/AT, EOMI, MMM, anicteric sclera; still has mild sinus tenderness  Neck: no carotid bruit, no JVD and supple, symmetrical, trachea midline  Lungs: CTAB, normal percussion bilaterally & nonlabored, good air movement. Mild upper airway interstitial sounds  Heart: regular rate and rhythm, S1, S2 normal, no murmur, click, rub or gallop and normal apical impulse  Abdomen: soft, non-tender; bowel sounds normal; no masses, no organomegaly  Extremities: extremities normal, atraumatic, no C/C/E, no ulcers, gangrene or trophic changes and mild bruising   Pulses: 2+ and symmetric  Neurologic: Grossly normal    Adult ECG Report  Rate: 74 ;  Rhythm: normal sinus rhythm and Normal axis, intervals and durations. Nonspecific T wave abnormality in aVL.;   Narrative Interpretation: grossly stable EKG, the T-wave inversion in aVL appears to be new but no other changes.   Other studies Reviewed: Additional studies/ records that were reviewed today include:  Recent Labs:  06/27/2004  Na+ 143, K+ 4.0, Cl- 107, HCO3- 29 , BUN 15, Cr 0.8, Glu 99, Ca2+ 9.5; AST 17, ALT 17 AlkP 76, Alb 3.5, TP 6.8, T Bili 0.8 CBC: W 6.7, H/H 14.2/40.8, Plt 268 TC 140, TG 104, HDL 34, LDL 85   ASSESSMENT / PLAN: Problem List Items Addressed This Visit    S/P CABG x 3 - Primary (Chronic)    CABG was in 65 and a PCI to the RCA that was not grafted in 2003. Last cath was in 2010 and last Myoview in January 2015 was low risk.      Relevant Orders   EKG 12-Lead   Orthostatic hypotension    Less pronounced with decreased dose of ACE inhibitor. He also decreased his p.m. Dose of Toprol      Relevant Medications   lisinopril (PRINIVIL,ZESTRIL) 10 MG tablet   rosuvastatin (CRESTOR) 10 MG tablet   Other Relevant Orders   EKG 12-Lead   Heart palpitations (Chronic)    Well-controlled with twice a day Toprol      Relevant Orders   EKG 12-Lead   Essential hypertension (Chronic)    Well-controlled. I would simply switch his lisinopril to 10 mg tablets, and if he is feels lightheaded he can take one half of that tablet.      Relevant Medications   lisinopril (PRINIVIL,ZESTRIL) 10 MG tablet   rosuvastatin (CRESTOR) 10 MG tablet   Other Relevant Orders   EKG 12-Lead   Dyslipidemia, goal LDL below 70 (Chronic)    LDL looks pretty good at 85 considering that he is only on intermittent low-dose Crestor. He would like to try to see if he continues his wife's 10 mg tablets. She should try to simply use a  10 mg tablets on the 3 days a week he is taking it. Hopefully,  he can tolerate 10 mg 3 days a week and 5 mg the other days.      Relevant Medications   lisinopril (PRINIVIL,ZESTRIL) 10 MG tablet   rosuvastatin (CRESTOR) 10 MG tablet   Other Relevant Orders   EKG 12-Lead   Coronary artery disease involving left main coronary artery (Chronic)    Multiple stress tests in the past and are normal.  Most recent test was in January 2015. No active angina symptoms. He is on Plavix plus aspirin. I think with his bruising we can stop the aspirin. He is on Imdur, Toprol and lisinopril. He is also on intermittent Crestor.      Relevant Medications   lisinopril (PRINIVIL,ZESTRIL) 10 MG tablet   rosuvastatin (CRESTOR) 10 MG tablet      Current medicines are reviewed at length with the patient today. (+/- concerns) -- only able to tolerate 1/2 of 20mg  Lisinopril; Asked about using his wife's left-over 10 mg Crestor.  The following changes have been made:   DECREASE LISINOPRIL TO 10 MG DAILY. YOU MAY STILL HOLD LISINOPRIL IF YOU BECOME DIZZY.   TRY TAKING 10 MG CRESTOR 3 TIMES A WEEK - IF ABLE TO  TOLERATE -  CALL OFFICE WE CAN REFILL MEDICATION  NO OTHER CHANGES.   Your physician recommends that you schedule a follow-up appointment in 6 MONTH WITH LUKE Hearne PA.   Your physician wants you to follow-up in 12 MONTHS WITH DR Kaydan Wong.  Studies Ordered:   Orders Placed This Encounter  Procedures  . EKG 12-Lead     Marykay Lex, M.D., M.S. Interventional Cardiologist   Pager # [phone]

## 2015-04-05 ENCOUNTER — Encounter: Payer: Self-pay | Admitting: Cardiology

## 2015-04-05 NOTE — Assessment & Plan Note (Signed)
CABG was in 2797 and a PCI to the RCA that was not grafted in 2003. Last cath was in 2010 and last Myoview in January 2015 was low risk.

## 2015-04-05 NOTE — Assessment & Plan Note (Signed)
Multiple stress tests in the past and are normal.  Most recent test was in January 2015. No active angina symptoms. He is on Plavix plus aspirin. I think with his bruising we can stop the aspirin. He is on Imdur, Toprol and lisinopril. He is also on intermittent Crestor.

## 2015-04-05 NOTE — Assessment & Plan Note (Signed)
Well-controlled with twice a day Toprol

## 2015-04-05 NOTE — Assessment & Plan Note (Signed)
LDL looks pretty good at 85 considering that he is only on intermittent low-dose Crestor. He would like to try to see if he continues his wife's 10 mg tablets. She should try to simply use a 10 mg tablets on the 3 days a week he is taking it. Hopefully, he can tolerate 10 mg 3 days a week and 5 mg the other days.

## 2015-04-05 NOTE — Assessment & Plan Note (Signed)
Less pronounced with decreased dose of ACE inhibitor. He also decreased his p.m. Dose of Toprol

## 2015-04-05 NOTE — Assessment & Plan Note (Signed)
Well-controlled. I would simply switch his lisinopril to 10 mg tablets, and if he is feels lightheaded he can take one half of that tablet.

## 2015-08-06 ENCOUNTER — Encounter: Payer: Self-pay | Admitting: *Deleted

## 2015-08-06 ENCOUNTER — Ambulatory Visit (INDEPENDENT_AMBULATORY_CARE_PROVIDER_SITE_OTHER): Payer: Medicare PPO | Admitting: Cardiology

## 2015-08-06 ENCOUNTER — Encounter: Payer: Self-pay | Admitting: Cardiology

## 2015-08-06 VITALS — BP 128/76 | HR 60 | Ht 75.0 in | Wt 198.8 lb

## 2015-08-06 DIAGNOSIS — Z9861 Coronary angioplasty status: Secondary | ICD-10-CM | POA: Diagnosis not present

## 2015-08-06 DIAGNOSIS — R079 Chest pain, unspecified: Secondary | ICD-10-CM | POA: Diagnosis not present

## 2015-08-06 DIAGNOSIS — I2510 Atherosclerotic heart disease of native coronary artery without angina pectoris: Secondary | ICD-10-CM

## 2015-08-06 DIAGNOSIS — Z01812 Encounter for preprocedural laboratory examination: Secondary | ICD-10-CM

## 2015-08-06 DIAGNOSIS — I10 Essential (primary) hypertension: Secondary | ICD-10-CM | POA: Diagnosis not present

## 2015-08-06 DIAGNOSIS — Z951 Presence of aortocoronary bypass graft: Secondary | ICD-10-CM | POA: Diagnosis not present

## 2015-08-06 DIAGNOSIS — I200 Unstable angina: Secondary | ICD-10-CM | POA: Insufficient documentation

## 2015-08-06 DIAGNOSIS — E785 Hyperlipidemia, unspecified: Secondary | ICD-10-CM

## 2015-08-06 LAB — CBC WITH DIFFERENTIAL/PLATELET
Basophils Absolute: 0 K/uL (ref 0.0–0.1)
Basophils Relative: 0 % (ref 0–1)
Eosinophils Absolute: 0.1 K/uL (ref 0.0–0.7)
Eosinophils Relative: 1 % (ref 0–5)
HCT: 42 % (ref 39.0–52.0)
Hemoglobin: 14.6 g/dL (ref 13.0–17.0)
Lymphocytes Relative: 35 % (ref 12–46)
Lymphs Abs: 1.9 K/uL (ref 0.7–4.0)
MCH: 30.5 pg (ref 26.0–34.0)
MCHC: 34.8 g/dL (ref 30.0–36.0)
MCV: 87.9 fL (ref 78.0–100.0)
MPV: 9.3 fL (ref 8.6–12.4)
Monocytes Absolute: 0.5 K/uL (ref 0.1–1.0)
Monocytes Relative: 9 % (ref 3–12)
Neutro Abs: 3 K/uL (ref 1.7–7.7)
Neutrophils Relative %: 55 % (ref 43–77)
Platelets: 192 K/uL (ref 150–400)
RBC: 4.78 MIL/uL (ref 4.22–5.81)
RDW: 13.9 % (ref 11.5–15.5)
WBC: 5.4 K/uL (ref 4.0–10.5)

## 2015-08-06 LAB — BASIC METABOLIC PANEL WITH GFR
BUN: 21 mg/dL (ref 7–25)
CO2: 27 mmol/L (ref 20–31)
Calcium: 9.2 mg/dL (ref 8.6–10.3)
Chloride: 104 mmol/L (ref 98–110)
Creat: 0.88 mg/dL (ref 0.70–1.11)
Glucose, Bld: 108 mg/dL — ABNORMAL HIGH (ref 65–99)
Potassium: 4.3 mmol/L (ref 3.5–5.3)
Sodium: 140 mmol/L (ref 135–146)

## 2015-08-06 NOTE — Progress Notes (Signed)
08/06/2015 Daniel [name]   02/27/1933  [id number]  Primary Physician ARONSON,RICHARD A, MD Primary Cardiologist: Dr Herbie Baltimore  HPI:  Pleasant 82 y/o with a history of CABG in '97, PCI in 2008, last cath 2010- medical Rx. He had a low risk Myoview Jan 2015.  He is in the office today with complaints of shoulder pain. The pt says 3 weeks ago he had an episode of Lt shoulder pain at rest. Initially he felt this was from arthritis. He put some arthritis cream on without relief. His symptoms progressed "7/10" and spread across his chest. He finally took a SL NTG with relief of his symptoms. He has had 3 episodes since- all similar in nature.    Current Outpatient Prescriptions  Medication Sig Dispense Refill  . aspirin 81 MG tablet Take 81 mg by mouth daily.     . ASTEPRO 0.15 % SOLN Place 1 spray into the nose at bedtime.  0  . clopidogrel (PLAVIX) 75 MG tablet Take 75 mg by mouth daily.    . isosorbide mononitrate (IMDUR) 30 MG 24 hr tablet Take 30 mg by mouth daily.    [name] lisinopril (PRINIVIL,ZESTRIL) 10 MG tablet Take 1 tablet (10 mg total) by mouth daily. 90 tablet 3  . metoprolol succinate (TOPROL-XL) 50 MG 24 hr tablet Take 1 1/2 in the morning and 1 at night.    . montelukast (SINGULAIR) 10 MG tablet Take 10 mg by mouth at bedtime.    . nitroGLYCERIN (NITROSTAT) 0.4 MG SL tablet Place 1 tablet (0.4 mg total) under the tongue every 5 (five) minutes as needed for chest pain. 25 tablet 4  . rosuvastatin (CRESTOR) 10 MG tablet Take 1 tablet (10 mg total) by mouth 3 (three) times a week. 90 tablet 3  . tamsulosin (FLOMAX) 0.4 MG CAPS capsule Take 1 capsule by mouth at bedtime.   0  . traMADol (ULTRAM) 50 MG tablet Take 2-4 tablets daily    . zolpidem (AMBIEN) 10 MG tablet Take 5 mg by mouth at bedtime as needed for sleep.     No current facility-administered medications for this visit.    Allergies  Allergen Reactions  . Penicillins     Swelling and redness in joints  . Statins     At  high dose  . Sulfa Antibiotics     Pain in left side underneath ribcage-pancreas    Social History   Social History  . Marital Status: Married    Spouse Name: N/A  . Number of Children: N/A  . Years of Education: N/A   Occupational History  . Not on file.   Social History Main Topics  . Smoking status: Never Smoker   . Smokeless tobacco: Not on file  . Alcohol Use: No  . Drug Use: No  . Sexual Activity: Not on file   Other Topics Concern  . Not on file   Social History Narrative   He is a married father of 2, grandfather 5. He is retired from [employer]. He travels back and forth between here and [place], [place] where part of his family lives.  He is usually very active, but is limited as far as standing exercise by his knee osteoarthritis.   He does not drink and does not smoke. Never smoked.     Review of Systems: General: negative for chills, fever, night sweats or weight changes.  Cardiovascular: negative for chest pain, dyspnea on exertion, edema, orthopnea, palpitations, paroxysmal nocturnal dyspnea or shortness  of breath Dermatological: negative for rash Respiratory: negative for cough or wheezing Urologic: negative for hematuria Abdominal: negative for nausea, vomiting, diarrhea, bright red blood per rectum, melena, or hematemesis Neurologic: negative for visual changes, syncope, or dizziness All other systems reviewed and are otherwise negative except as noted above.    Blood pressure 128/76, pulse 60, height  (1.905 m), weight 198 lb 12.8 oz (90.175 kg).  General appearance: alert, cooperative and no distress Neck: no carotid bruit and no JVD Lungs: clear to auscultation bilaterally Heart: regular rate and rhythm Extremities: extremities normal, atraumatic, no cyanosis or edema Skin: Skin color, texture, turgor normal. No rashes or lesions Neurologic: Grossly normal Abd: Non tender, not distended  EKG NSR  ASSESSMENT AND PLAN:   Chest pain with  high risk of acute coronary syndrome Pt has had 3 episodes of pain across his shoulders-  "7/10"  Relieved with NTG  S/P CABG x 3 LIMA-LAD, SVG to diagonal, SVG-OM  CAD S/P PCI June 2008 S/P PDA BMS by Dr Elsie Lincoln June 2008 Last cath 2010: 70-80 % ostial left main, 80-90% LAD, subtotal occluded circumflex, RCA now a patent stent; SVG-diagonal backfills LAD, atretic LIMA-LAD -- medical therapy  Essential hypertension Controlled  Dyslipidemia, goal LDL below 70 LDL 76-- 07/23/15   PLAN  Discussed with Dr Herbie Baltimore. Plan OP cath.  Shontez Sermon K PA-C 08/06/2015 10:18 AM

## 2015-08-06 NOTE — Assessment & Plan Note (Signed)
Pt has had 3 episodes of pain across his shoulders-  "7/10"  Relieved with NTG

## 2015-08-06 NOTE — Patient Instructions (Signed)
Medication Instructions:  Your physician recommends that you continue on your current medications as directed. Please refer to the Current Medication list given to you today.  Labwork: Pre procedure  Labs today: BMET & CBCD  Testing/Procedures: Your physician has requested that you have a cardiac catheterization. Cardiac catheterization is used to diagnose and/or treat various heart conditions. Doctors may recommend this procedure for a number of different reasons. The most common reason is to evaluate chest pain. Chest pain can be a symptom of coronary artery disease (CAD), and cardiac catheterization can show whether plaque is narrowing or blocking your heart's arteries. This procedure is also used to evaluate the valves, as well as measure the blood flow and oxygen levels in different parts of your heart. For further information please visit [email or web address] Please follow instruction sheet, as given.  Follow-Up: Follow up to be arranged/scheduled at the hospital, after your catheterization  If you need a refill on your cardiac medications before your next appointment, please call your pharmacy.  Thank you for choosing [facility]!!

## 2015-08-06 NOTE — Assessment & Plan Note (Signed)
LIMA-LAD, SVG to diagonal, SVG-OM

## 2015-08-06 NOTE — Assessment & Plan Note (Signed)
S/P PDA BMS by Dr Elsie Lincoln June 2008 Last cath 2010: 70-80 % ostial left main, 80-90% LAD, subtotal occluded circumflex, RCA now a patent stent; SVG-diagonal backfills LAD, atretic LIMA-LAD -- medical therapy

## 2015-08-06 NOTE — Assessment & Plan Note (Signed)
Controlled.  

## 2015-08-06 NOTE — Assessment & Plan Note (Signed)
LDL 76-- 07/23/15

## 2015-08-08 ENCOUNTER — Encounter (HOSPITAL_COMMUNITY): Payer: Self-pay | Admitting: Cardiology

## 2015-08-08 ENCOUNTER — Ambulatory Visit (HOSPITAL_COMMUNITY)
Admission: RE | Admit: 2015-08-08 | Discharge: 2015-08-08 | Disposition: A | Discharge status: Home / Self Care | Payer: Medicare PPO | Source: Ambulatory Visit | Attending: Cardiology | Admitting: Cardiology

## 2015-08-08 ENCOUNTER — Encounter (HOSPITAL_COMMUNITY)
Admission: RE | Disposition: A | Discharge status: Home / Self Care | Payer: Self-pay | Source: Ambulatory Visit | Attending: Cardiology

## 2015-08-08 DIAGNOSIS — E785 Hyperlipidemia, unspecified: Secondary | ICD-10-CM | POA: Diagnosis present

## 2015-08-08 DIAGNOSIS — I2510 Atherosclerotic heart disease of native coronary artery without angina pectoris: Secondary | ICD-10-CM

## 2015-08-08 DIAGNOSIS — Z7982 Long term (current) use of aspirin: Secondary | ICD-10-CM | POA: Insufficient documentation

## 2015-08-08 DIAGNOSIS — Z7902 Long term (current) use of antithrombotics/antiplatelets: Secondary | ICD-10-CM | POA: Insufficient documentation

## 2015-08-08 DIAGNOSIS — I2584 Coronary atherosclerosis due to calcified coronary lesion: Secondary | ICD-10-CM | POA: Insufficient documentation

## 2015-08-08 DIAGNOSIS — Z9861 Coronary angioplasty status: Secondary | ICD-10-CM

## 2015-08-08 DIAGNOSIS — I2582 Chronic total occlusion of coronary artery: Secondary | ICD-10-CM | POA: Insufficient documentation

## 2015-08-08 DIAGNOSIS — I200 Unstable angina: Secondary | ICD-10-CM | POA: Diagnosis present

## 2015-08-08 DIAGNOSIS — Z880 Allergy status to penicillin: Secondary | ICD-10-CM | POA: Diagnosis not present

## 2015-08-08 DIAGNOSIS — I25118 Atherosclerotic heart disease of native coronary artery with other forms of angina pectoris: Secondary | ICD-10-CM

## 2015-08-08 DIAGNOSIS — Z951 Presence of aortocoronary bypass graft: Secondary | ICD-10-CM

## 2015-08-08 DIAGNOSIS — I10 Essential (primary) hypertension: Secondary | ICD-10-CM | POA: Diagnosis present

## 2015-08-08 DIAGNOSIS — I25110 Atherosclerotic heart disease of native coronary artery with unstable angina pectoris: Secondary | ICD-10-CM | POA: Diagnosis present

## 2015-08-08 HISTORY — PX: CARDIAC CATHETERIZATION: SHX172

## 2015-08-08 LAB — PROTIME-INR
INR: 1.08 (ref 0.00–1.49)
Prothrombin Time: 14.2 seconds (ref 11.6–15.2)

## 2015-08-08 SURGERY — LEFT HEART CATH AND CORS/GRAFTS ANGIOGRAPHY

## 2015-08-08 MED ORDER — HEPARIN (PORCINE) IN NACL 2-0.9 UNIT/ML-% IJ SOLN
INTRAMUSCULAR | Status: DC | PRN
  Administered 2015-08-08: 1000 mL

## 2015-08-08 MED ORDER — ONDANSETRON HCL 4 MG/2ML IJ SOLN: 4.0000 mg | Freq: Four times a day (QID) | INTRAMUSCULAR | Status: DC | PRN

## 2015-08-08 MED ORDER — ISOSORBIDE MONONITRATE ER 30 MG PO TB24
30.0000 mg | ORAL_TABLET | Freq: Every day | ORAL | Status: DC
  Administered 2015-08-08: 30 mg via ORAL
  Filled 2015-08-08: qty 1

## 2015-08-08 MED ORDER — SODIUM CHLORIDE 0.9% FLUSH: 3.0000 mL | INTRAVENOUS | Status: DC | PRN

## 2015-08-08 MED ORDER — SODIUM CHLORIDE 0.9% FLUSH: 3.0000 mL | Freq: Two times a day (BID) | INTRAVENOUS | Status: DC

## 2015-08-08 MED ORDER — SODIUM CHLORIDE 0.9 % IV SOLN: 250.0000 mL | INTRAVENOUS | Status: DC | PRN

## 2015-08-08 MED ORDER — METOPROLOL SUCCINATE ER 50 MG PO TB24
50.0000 mg | ORAL_TABLET | Freq: Two times a day (BID) | ORAL | Status: DC
Start: 2015-08-08 — End: 2015-08-08

## 2015-08-08 MED ORDER — SODIUM CHLORIDE 0.9 % WEIGHT BASED INFUSION
3.0000 mL/kg/h | INTRAVENOUS | Status: AC
  Administered 2015-08-08: 3 mL/kg/h via INTRAVENOUS

## 2015-08-08 MED ORDER — CLOPIDOGREL BISULFATE 75 MG PO TABS: 75.0000 mg | ORAL_TABLET | Freq: Every day | ORAL | Status: DC

## 2015-08-08 MED ORDER — MIDAZOLAM HCL 2 MG/2ML IJ SOLN
INTRAMUSCULAR | Status: DC | PRN
  Administered 2015-08-08 (×2): 1 mg via INTRAVENOUS

## 2015-08-08 MED ORDER — LISINOPRIL 10 MG PO TABS
10.0000 mg | ORAL_TABLET | Freq: Every day | ORAL | Status: DC
  Administered 2015-08-08: 10 mg via ORAL
  Filled 2015-08-08: qty 1

## 2015-08-08 MED ORDER — VERAPAMIL HCL 2.5 MG/ML IV SOLN
INTRAVENOUS | Status: AC
  Filled 2015-08-08: qty 2

## 2015-08-08 MED ORDER — FENTANYL CITRATE (PF) 100 MCG/2ML IJ SOLN
INTRAMUSCULAR | Status: AC
  Filled 2015-08-08: qty 2

## 2015-08-08 MED ORDER — LIDOCAINE HCL (PF) 1 % IJ SOLN
INTRAMUSCULAR | Status: DC | PRN
  Administered 2015-08-08: 4 mL via INTRADERMAL

## 2015-08-08 MED ORDER — HEPARIN SODIUM (PORCINE) 1000 UNIT/ML IJ SOLN
INTRAMUSCULAR | Status: DC | PRN
  Administered 2015-08-08: 4500 [IU] via INTRAVENOUS

## 2015-08-08 MED ORDER — ACETAMINOPHEN 325 MG PO TABS: 650.0000 mg | ORAL_TABLET | ORAL | Status: DC | PRN

## 2015-08-08 MED ORDER — SODIUM CHLORIDE 0.9 % WEIGHT BASED INFUSION: 1.0000 mL/kg/h | INTRAVENOUS | Status: DC

## 2015-08-08 MED ORDER — METOPROLOL SUCCINATE ER 50 MG PO TB24
50.0000 mg | ORAL_TABLET | Freq: Every day | ORAL | Status: DC
  Filled 2015-08-08: qty 1

## 2015-08-08 MED ORDER — HEPARIN SODIUM (PORCINE) 1000 UNIT/ML IJ SOLN
INTRAMUSCULAR | Status: AC
  Filled 2015-08-08: qty 1

## 2015-08-08 MED ORDER — LIDOCAINE HCL (PF) 1 % IJ SOLN
INTRAMUSCULAR | Status: AC
  Filled 2015-08-08: qty 30

## 2015-08-08 MED ORDER — HEPARIN (PORCINE) IN NACL 2-0.9 UNIT/ML-% IJ SOLN
INTRAMUSCULAR | Status: AC
  Filled 2015-08-08: qty 1000

## 2015-08-08 MED ORDER — IOHEXOL 350 MG/ML SOLN
INTRAVENOUS | Status: DC | PRN
  Administered 2015-08-08: 140 mL via INTRA_ARTERIAL

## 2015-08-08 MED ORDER — MORPHINE SULFATE (PF) 2 MG/ML IV SOLN: 2.0000 mg | INTRAVENOUS | Status: DC | PRN

## 2015-08-08 MED ORDER — FENTANYL CITRATE (PF) 100 MCG/2ML IJ SOLN
INTRAMUSCULAR | Status: DC | PRN
  Administered 2015-08-08: 50 ug via INTRAVENOUS
  Administered 2015-08-08: 25 ug via INTRAVENOUS

## 2015-08-08 MED ORDER — SODIUM CHLORIDE 0.9 % WEIGHT BASED INFUSION: 3.0000 mL/kg/h | INTRAVENOUS | Status: DC

## 2015-08-08 MED ORDER — METOPROLOL SUCCINATE ER 50 MG PO TB24
75.0000 mg | ORAL_TABLET | Freq: Every day | ORAL | Status: DC
  Administered 2015-08-08: 75 mg via ORAL
  Filled 2015-08-08: qty 1

## 2015-08-08 MED ORDER — NITROGLYCERIN 0.4 MG SL SUBL: 0.4000 mg | SUBLINGUAL_TABLET | SUBLINGUAL | Status: DC | PRN

## 2015-08-08 MED ORDER — ASPIRIN 81 MG PO CHEW: 81.0000 mg | CHEWABLE_TABLET | ORAL | Status: DC

## 2015-08-08 MED ORDER — VERAPAMIL HCL 2.5 MG/ML IV SOLN
INTRAVENOUS | Status: DC | PRN
  Administered 2015-08-08: 10 mL via INTRA_ARTERIAL

## 2015-08-08 MED ORDER — MIDAZOLAM HCL 2 MG/2ML IJ SOLN
INTRAMUSCULAR | Status: AC
  Filled 2015-08-08: qty 2

## 2015-08-08 SURGICAL SUPPLY — 16 items
CATH INFINITI 5FR ANG PIGTAIL (CATHETERS) ×3 IMPLANT
CATH INFINITI 5FR MULTPACK ANG (CATHETERS) ×3 IMPLANT
CATH LAUNCHER 5F RADL (CATHETERS) ×1 IMPLANT
CATH OPTITORQUE SARAH 4.5 5F (CATHETERS) ×3 IMPLANT
CATHETER LAUNCHER 5F RADL (CATHETERS) ×3
DEVICE RAD COMP TR BAND LRG (VASCULAR PRODUCTS) ×3 IMPLANT
GLIDESHEATH SLEND A-KIT 6F 22G (SHEATH) ×3 IMPLANT
KIT HEART LEFT (KITS) ×3 IMPLANT
PACK CARDIAC CATHETERIZATION (CUSTOM PROCEDURE TRAY) ×3 IMPLANT
SHEATH PINNACLE 5F 10CM (SHEATH) IMPLANT
SYR MEDRAD MARK V 150ML (SYRINGE) ×3 IMPLANT
TRANSDUCER W/STOPCOCK (MISCELLANEOUS) ×3 IMPLANT
TUBING CIL FLEX 10 FLL-RA (TUBING) ×3 IMPLANT
WIRE EMERALD 3MM-J .035X150CM (WIRE) IMPLANT
WIRE HI TORQ VERSACORE-J 145CM (WIRE) ×3 IMPLANT
WIRE SAFE-T 1.5MM-J .035X260CM (WIRE) ×6 IMPLANT

## 2015-08-08 NOTE — Interval H&P Note (Signed)
History and Physical Interval Note:  08/08/2015 7:25 AM  Daniel [name]  has presented today for surgery, with the diagnosis of CP - Unstable Angina. The various methods of treatment have been discussed with the patient and family. After consideration of risks, benefits and other options for treatment, the patient has consented to  Procedure(s): Left Heart Cath and Cors/Grafts Angiography (N/A) with possible percutaneous coronary intervention as a surgical intervention .  The patient's history has been reviewed, patient examined, no change in status, stable for surgery.  I have reviewed the patient's chart and labs.  Questions were answered to the patient's satisfaction.    Cath Lab Visit (complete for each Cath Lab visit)  Clinical Evaluation Leading to the Procedure:   ACS: No. Resting Angina but not currently present  Non-ACS:    Anginal Classification: CCS III  Anti-ischemic medical therapy: Maximal Therapy (2 or more classes of medications)  Non-Invasive Test Results: No non-invasive testing performed  Prior CABG: Previous CABG   AUC: A conclusive assessment cannot be generated from this patient's information.  Daniel [name] W

## 2015-08-08 NOTE — H&P (View-Only) (Signed)
08/06/2015 Daniel [name]   02/27/1933  [id number]  Primary Physician ARONSON,RICHARD A, MD Primary Cardiologist: Dr Herbie Baltimore  HPI:  Pleasant 82 y/o with a history of CABG in '97, PCI in 2008, last cath 2010- medical Rx. He had a low risk Myoview Jan 2015.  He is in the office today with complaints of shoulder pain. The pt says 3 weeks ago he had an episode of Lt shoulder pain at rest. Initially he felt this was from arthritis. He put some arthritis cream on without relief. His symptoms progressed "7/10" and spread across his chest. He finally took a SL NTG with relief of his symptoms. He has had 3 episodes since- all similar in nature.    Current Outpatient Prescriptions  Medication Sig Dispense Refill  . aspirin 81 MG tablet Take 81 mg by mouth daily.     . ASTEPRO 0.15 % SOLN Place 1 spray into the nose at bedtime.  0  . clopidogrel (PLAVIX) 75 MG tablet Take 75 mg by mouth daily.    . isosorbide mononitrate (IMDUR) 30 MG 24 hr tablet Take 30 mg by mouth daily.    [name] lisinopril (PRINIVIL,ZESTRIL) 10 MG tablet Take 1 tablet (10 mg total) by mouth daily. 90 tablet 3  . metoprolol succinate (TOPROL-XL) 50 MG 24 hr tablet Take 1 1/2 in the morning and 1 at night.    . montelukast (SINGULAIR) 10 MG tablet Take 10 mg by mouth at bedtime.    . nitroGLYCERIN (NITROSTAT) 0.4 MG SL tablet Place 1 tablet (0.4 mg total) under the tongue every 5 (five) minutes as needed for chest pain. 25 tablet 4  . rosuvastatin (CRESTOR) 10 MG tablet Take 1 tablet (10 mg total) by mouth 3 (three) times a week. 90 tablet 3  . tamsulosin (FLOMAX) 0.4 MG CAPS capsule Take 1 capsule by mouth at bedtime.   0  . traMADol (ULTRAM) 50 MG tablet Take 2-4 tablets daily    . zolpidem (AMBIEN) 10 MG tablet Take 5 mg by mouth at bedtime as needed for sleep.     No current facility-administered medications for this visit.    Allergies  Allergen Reactions  . Penicillins     Swelling and redness in joints  . Statins     At  high dose  . Sulfa Antibiotics     Pain in left side underneath ribcage-pancreas    Social History   Social History  . Marital Status: Married    Spouse Name: N/A  . Number of Children: N/A  . Years of Education: N/A   Occupational History  . Not on file.   Social History Main Topics  . Smoking status: Never Smoker   . Smokeless tobacco: Not on file  . Alcohol Use: No  . Drug Use: No  . Sexual Activity: Not on file   Other Topics Concern  . Not on file   Social History Narrative   He is a married father of 2, grandfather 5. He is retired from University. He travels back and forth between here and [place], [place] where part of his family lives.  He is usually very active, but is limited as far as standing exercise by his knee osteoarthritis.   He does not drink and does not smoke. Never smoked.     Review of Systems: General: negative for chills, fever, night sweats or weight changes.  Cardiovascular: negative for chest pain, dyspnea on exertion, edema, orthopnea, palpitations, paroxysmal nocturnal dyspnea or shortness  of breath Dermatological: negative for rash Respiratory: negative for cough or wheezing Urologic: negative for hematuria Abdominal: negative for nausea, vomiting, diarrhea, bright red blood per rectum, melena, or hematemesis Neurologic: negative for visual changes, syncope, or dizziness All other systems reviewed and are otherwise negative except as noted above.    Blood pressure 128/76, pulse 60, height 6\' 3"  (1.905 m), weight 198 lb 12.8 oz (90.175 kg).  General appearance: alert, cooperative and no distress Neck: no carotid bruit and no JVD Lungs: clear to auscultation bilaterally Heart: regular rate and rhythm Extremities: extremities normal, atraumatic, no cyanosis or edema Skin: Skin color, texture, turgor normal. No rashes or lesions Neurologic: Grossly normal Abd: Non tender, not distended  EKG NSR  ASSESSMENT AND PLAN:   Chest pain with  high risk of acute coronary syndrome Pt has had 3 episodes of pain across his shoulders-  "7/10"  Relieved with NTG  S/P CABG x 3 LIMA-LAD, SVG to diagonal, SVG-OM  CAD S/P PCI June 2008 S/P PDA BMS by Dr Elsie LincolnGamble June 2008 Last cath 2010: 70-80 % ostial left main, 80-90% LAD, subtotal occluded circumflex, RCA now a patent stent; SVG-diagonal backfills LAD, atretic LIMA-LAD -- medical therapy  Essential hypertension Controlled  Dyslipidemia, goal LDL below 70 LDL 76-- 07/23/15   PLAN  Discussed with Dr Herbie BaltimoreHarding. Plan OP cath.  [name] PA-C 08/06/2015 10:18 AM

## 2015-08-08 NOTE — Discharge Instructions (Signed)
Radial Site Care °Refer to this sheet in the next few weeks. These instructions provide you with information about caring for yourself after your procedure. Your health care provider may also give you more specific instructions. Your treatment has been planned according to current medical practices, but problems sometimes occur. Call your health care provider if you have any problems or questions after your procedure. °WHAT TO EXPECT AFTER THE PROCEDURE °After your procedure, it is typical to have the following: °· Bruising at the radial site that usually fades within 1-2 weeks. °· Blood collecting in the tissue (hematoma) that may be painful to the touch. It should usually decrease in size and tenderness within 1-2 weeks. °HOME CARE INSTRUCTIONS °· Take medicines only as directed by your health care provider. °· You may shower 24-48 hours after the procedure or as directed by your health care provider. Remove the bandage (dressing) and gently wash the site with plain soap and water. Pat the area dry with a clean towel. Do not rub the site, because this may cause bleeding. °· Do not take baths, swim, or use a hot tub until your health care provider approves. °· Check your insertion site every day for redness, swelling, or drainage. °· Do not apply powder or lotion to the site. °· Do not flex or bend the affected arm for 24 hours or as directed by your health care provider. °· Do not push or pull heavy objects with the affected arm for 24 hours or as directed by your health care provider. °· Do not lift over 10 lb (4.5 kg) for 5 days after your procedure or as directed by your health care provider. °· Ask your health care provider when it is okay to: °¨ Return to work or school. °¨ Resume usual physical activities or sports. °¨ Resume sexual activity. °· Do not drive home if you are discharged the same day as the procedure. Have someone else drive you. °· You may drive 24 hours after the procedure unless otherwise  instructed by your health care provider. °· Do not operate machinery or power tools for 24 hours after the procedure. °· If your procedure was done as an outpatient procedure, which means that you went home the same day as your procedure, a responsible adult should be with you for the first 24 hours after you arrive home. °· Keep all follow-up visits as directed by your health care provider. This is important. °SEEK MEDICAL CARE IF: °· You have a fever. °· You have chills. °· You have increased bleeding from the radial site. Hold pressure on the site. °SEEK IMMEDIATE MEDICAL CARE IF: °· You have unusual pain at the radial site. °· You have redness, warmth, or swelling at the radial site. °· You have drainage (other than a small amount of blood on the dressing) from the radial site. °· The radial site is bleeding, and the bleeding does not stop after 30 minutes of holding steady pressure on the site. °· Your arm or hand becomes pale, cool, tingly, or numb. °  °This information is not intended to replace advice given to you by your health care provider. Make sure you discuss any questions you have with your health care provider. °  °Document Released: 06/26/2010 Document Revised: 06/14/2014 Document Reviewed: 12/10/2013 °Elsevier Interactive Patient Education ©2016 [employer] ° °

## 2015-09-01 ENCOUNTER — Telehealth: Payer: Self-pay

## 2015-09-01 NOTE — Telephone Encounter (Signed)
Patients insurance will not cover Qnasal unless a Prior Berkley Harveyauth is done.  Insurance will cover Flonase, Azelastine,  Flunisolide.     [facility] [phone] Fax# [phone]

## 2015-09-01 NOTE — Telephone Encounter (Signed)
PA sent to cover my meds for Qnasl

## 2015-09-03 ENCOUNTER — Other Ambulatory Visit: Payer: Self-pay | Admitting: Cardiology

## 2015-09-04 ENCOUNTER — Other Ambulatory Visit: Payer: Self-pay

## 2015-09-04 MED ORDER — AZELASTINE HCL 0.1 % NA SOLN
2.0000 | Freq: Two times a day (BID) | NASAL | Status: DC
Start: 2015-09-04 — End: 2016-07-08

## 2015-09-04 MED ORDER — FLUNISOLIDE 25 MCG/ACT (0.025%) NA SOLN: 1.0000 | Freq: Two times a day (BID) | NASAL | Status: DC

## 2015-09-04 NOTE — Telephone Encounter (Signed)
REFILL 

## 2015-09-09 ENCOUNTER — Encounter: Payer: Self-pay | Admitting: Cardiology

## 2015-09-09 ENCOUNTER — Ambulatory Visit (INDEPENDENT_AMBULATORY_CARE_PROVIDER_SITE_OTHER): Payer: Medicare PPO | Admitting: Cardiology

## 2015-09-09 VITALS — BP 130/60 | HR 59 | Ht 75.0 in | Wt 197.2 lb

## 2015-09-09 DIAGNOSIS — I951 Orthostatic hypotension: Secondary | ICD-10-CM

## 2015-09-09 DIAGNOSIS — I10 Essential (primary) hypertension: Secondary | ICD-10-CM | POA: Diagnosis not present

## 2015-09-09 DIAGNOSIS — Z951 Presence of aortocoronary bypass graft: Secondary | ICD-10-CM

## 2015-09-09 DIAGNOSIS — E785 Hyperlipidemia, unspecified: Secondary | ICD-10-CM | POA: Diagnosis not present

## 2015-09-09 DIAGNOSIS — R0789 Other chest pain: Secondary | ICD-10-CM

## 2015-09-09 DIAGNOSIS — Z9861 Coronary angioplasty status: Secondary | ICD-10-CM

## 2015-09-09 DIAGNOSIS — R5383 Other fatigue: Secondary | ICD-10-CM

## 2015-09-09 DIAGNOSIS — I2510 Atherosclerotic heart disease of native coronary artery without angina pectoris: Secondary | ICD-10-CM

## 2015-09-09 DIAGNOSIS — I200 Unstable angina: Secondary | ICD-10-CM | POA: Diagnosis not present

## 2015-09-09 MED ORDER — LISINOPRIL 10 MG PO TABS: 5.0000 mg | ORAL_TABLET | Freq: Two times a day (BID) | ORAL | Status: DC

## 2015-09-09 NOTE — Patient Instructions (Signed)
MAY STOP TAKING IMDUR TO SEE HOW YOU DO ,IF NO CHEST DISCOMFORT THEN STOP  CHANGE LISINOPRIL  1/2 TABLET  TWICE A DAY  CHANGE TAKING METOPROLOL 50 MG TWICE A DAY , MAY USE 25 MG IF NEEDED   Your physician wants you to follow-up in 6 MONTHS WITH DR HARDING.  You will receive a reminder letter in the mail two months in advance. If you don't receive a letter, please call our office to schedule the follow-up appointment.   If you need a refill on your cardiac medications before your next appointment, please call your pharmacy.

## 2015-09-09 NOTE — Progress Notes (Signed)
PCP: Minda Meo, MD  Clinic Note: Chief Complaint  Patient presents with  . Follow-up    heart cath; stable CAD  . Fatigue    HPI: Daniel [name] is a 82 y.o. male with a PMH below who presents today for 1 month /fu. He is a former patient Dr. Julieanne Manson with a distant history of CAD-CABG dating back to 1997 in response to left main disease. He has a known atretic LIMA but the LAD fills via the SVG-D2. He had mild chronic angina in the past, but was recently evaluated by cardiac catheterization showing stable disease.  Daniel [name] was last seen last month by Daniel [name] for 3 episodes of CP - referred for Cath.  Recent Hospitalizations for cath  Studies Reviewed:   Procedures    Left Heart Cath and Cors/Grafts Angiography - 08/08/2015    Conclusion    1. Ost LM to LM lesion, 80% stenosed. LM-Ostial LAD lesion, 70-80% stenosed. Prox LAD to Mid LAD lesion, 100% stenosed after small D1. 2. Ost 2nd Mrg lesion, 100% stenosed. Very small caliber AV Groove Circumflex remains after OM1. 3. Prox RCA lesion, 20% stenosed. Dist RCA lesion, 20% stenosed. Widely patent PDA stent 4. SVG-OM2 was injected is large, and is anatomically normal. 5. SVG-D2 was injected is large, and is anatomically normal. Antegrade flow fills a moderate caliber diagonal vessel with minimal disease. Retrograde flow fills the native LAD with TIMI 3 flow. 6. LIMA-LAD was injected is small and diffusely Atretic. It almost does not reach the LAD, and is noted to have significant competetive flow from the Retrograde SVG-Diag flow. 7. The left ventricular systolic function is normal. 8. Systemic Hypertension with Mildly elevated LVEDP   Daniel [name] has stable coronary disease from his last catheterization in 2010. No culprit lesion is found. I suspect that his symptoms could be related to microvascular disease versus diastolic dysfunction. He'll need continued blood pressure management and consider low-dose  diuretic in clinic follow-up.  Plan:  Standard post radial cath care with TR band removal. He'll be discharged following TR band removal and bed rest.  He should've follow-up scheduled with either me or Corine Shelter, PA-C.  For now will continue current medications. We'll need to reassess his blood pressure control and volume status as an outpatient.    Interval History: NO more episodes of Shoulder-chest pain. Just feels tired all the time - hard to get up & go.   No resting or exertional dyspnea. No PND, orthopnea or edema. No palpitations, lightheadedness, dizziness, weakness or syncope/near syncope.  Was happy with the results and was trying to get back into his routine activity levels, but as noted that he just has less get up and go. This was predating the catheterization as well. No TIA/amaurosis fugax symptoms. No claudication.  ROS: A comprehensive was performed. Review of Systems  Constitutional: Positive for malaise/fatigue (Lack of "get up and go ").  HENT: Negative for nosebleeds.   Respiratory: Negative for cough, shortness of breath and wheezing.   Cardiovascular: Negative.  Negative for claudication.       Per history of present illness  Gastrointestinal: Negative for blood in stool and melena.  Genitourinary: Negative for hematuria.  Musculoskeletal: Negative for back pain and joint pain.  Neurological: Negative for dizziness (Despite having some fatigue, he denies any dizziness), sensory change, speech change, focal weakness, seizures, loss of consciousness and headaches.  Endo/Heme/Allergies: Does not bruise/bleed easily.  Psychiatric/Behavioral: Negative for depression and memory  loss. The patient is not nervous/anxious and does not have insomnia.   All other systems reviewed and are negative.    Past Medical History  Diagnosis Date  . Coronary artery disease involving left main coronary artery 1997-2010    Last cath 2010: 70-80 % ostial left main, 80-90% LAD,  subtotal occluded circumflex, RCA now a patent stent; SVG-diagonal backfills LAD, atretic LIMA-LAD -- medical therapy; stable by relook cath in March 2017  . S/P CABG x 3 1997    LIMA-LAD, SVG-D2, SVG-OM --> LIMA known to be atretic, but SVG-D2 fills diagonal and LAD  . CAD S/P percutaneous coronary angioplasty June 2008;     a. 6/'08: PCI nongrafted distal RCA-PDA: Mini vision BMS 2.25 mm x 28 mm; b. Relook Cath 08/2015: Stable CAD. No culprit lesion. Widely patent PDA stent. Patent SVG-OM 2, SVG-D2. Essentially atretic/small LIMA-LAD, but the LAD is perfused via SVG-D2  . CAD of autologous arterial graft 2010    Atretic LIMA; Myoview Jan 2015: Possible mild basal anteroseptal defect thought to be artifact (CRO early LAD ischemia due to inadeuate retrograde perfusion via SVG-Diag  . Hypertension   . Dyslipidemia, goal LDL below 70   . Heart palpitations     Never fully diagnosed.  . Insomnia     Past Surgical History  Procedure Laterality Date  . Coronary artery bypass graft  2/191997    LIMA - LAD, SVG- diagonal, SVG-OM  . Cardiac catheterization  June 2008    showed high grade distal RCA and PDA stenosis and underwent PCI with a 2.25 mm x 28 mm Mini Vision bare- metal stent.  . Cardiac catheterization  2010    Native coronaries at 70% to 80% ostial left main. LAD had diffuse 80% to 90% stenosis  . Nm myoview ltd  02/25/2004; Jan 2015    a) EF 57%; b)  Normal Nuclear Stress Test - No evidence of Ischemia or Infarction. Normal LV Function & wall motion.  . Cardiac catheterization N/A 08/08/2015    Procedure: Left Heart Cath and Cors/Grafts Angiography;  Surgeon: Marykay Lexavid W Achsah Mcquade, MD;  Location: [facility];  Service: Cardiovascular;  Laterality: N/A;   Prior to Admission medications   Medication Sig Start Date End Date Taking? Authorizing Provider  aspirin 81 MG tablet Take 81 mg by mouth daily.    Yes Historical Provider, MD  azelastine (ASTELIN) 0.1 % nasal spray Place 2 sprays  into both nostrils 2 (two) times daily. 09/04/15  Yes Cristal Fordalph Carter Bobbitt, MD  cholecalciferol (VITAMIN D) 1000 units tablet Take 1,000 Units by mouth daily.   Yes Historical Provider, MD  clopidogrel (PLAVIX) 75 MG tablet Take 75 mg by mouth daily.   Yes Historical Provider, MD  finasteride (PROSCAR) 5 MG tablet Take 10 mg by mouth daily. 07/31/15  Yes Historical Provider, MD  flunisolide (NASALIDE) 25 MCG/ACT (0.025%) SOLN Place 1 spray into the nose 2 (two) times daily. 09/04/15  Yes Cristal Fordalph Carter Bobbitt, MD  isosorbide mononitrate (IMDUR) 30 MG 24 hr tablet Take 30 mg by mouth daily.   Yes Historical Provider, MD  lisinopril (PRINIVIL,ZESTRIL) 10 MG tablet Take 1 tablet (10 mg total) by mouth daily. 04/03/15  Yes Marykay Lexavid W Shawnn Bouillon, MD  metoprolol succinate (TOPROL-XL) 50 MG 24 hr tablet Take 50-75 mg by mouth 2 (two) times daily. 75mg  in the morning, 50mg  in the evening   Yes Historical Provider, MD  montelukast (SINGULAIR) 10 MG tablet Take 10 mg by mouth at bedtime.  Yes Historical Provider, MD  NITROSTAT 0.4 MG SL tablet place 1 tablet under the tongue every 5 minutes if needed for chest pain 09/04/15  Yes Eda Paschal Kilroy, PA-C  rosuvastatin (CRESTOR) 10 MG tablet Take 1 tablet (10 mg total) by mouth 3 (three) times a week. 04/03/15  Yes Marykay Lex, MD  tamsulosin (FLOMAX) 0.4 MG CAPS capsule Take 1 capsule by mouth at bedtime.  06/08/14  Yes Historical Provider, MD  traMADol (ULTRAM) 50 MG tablet Take 100-200 mg by mouth daily as needed for moderate pain. Take 2-4 tablets daily   Yes Historical Provider, MD  zolpidem (AMBIEN) 10 MG tablet Take 5 mg by mouth at bedtime as needed for sleep.   Yes Historical Provider, MD   Allergies  Allergen Reactions  . Penicillins     Swelling and redness in joints  . Statins     At high dose  . Sulfa Antibiotics     Pain in left side underneath ribcage-pancreas     Social History   Social History  . Marital Status: Married    Spouse Name: N/A  .  Number of Children: N/A  . Years of Education: N/A   Social History Main Topics  . Smoking status: Never Smoker   . Smokeless tobacco: None  . Alcohol Use: No  . Drug Use: No  . Sexual Activity: Not Asked   Other Topics Concern  . None   Social History Narrative   He is a married father of 2, grandfather 5. He is retired from [employer]. He travels back and forth between here and [place], [place] where part of his family lives.  He is usually very active, but is limited as far as standing exercise by his knee osteoarthritis.   He does not drink and does not smoke. Never smoked.   History reviewed. No pertinent family history.   Wt Readings from Last 3 Encounters:  09/09/15 197 lb 3.2 oz (89.449 kg)  08/08/15 197 lb (89.359 kg)  08/06/15 198 lb 12.8 oz (90.175 kg)    PHYSICAL EXAM BP 130/60 mmHg  Pulse 59  Ht  (1.905 m)  Wt 197 lb 3.2 oz (89.449 kg)  BMI 24.65 kg/m2 General appearance: alert and oriented x3, cooperative, appears stated age, no distress and healthy appearing, pleasant gentleman. Well-nourished and well-groomed. Answers questions appropriately.  HEENT: [place]/AT, EOMI, MMM, anicteric sclera; still has mild sinus tenderness  Neck: no carotid bruit, no JVD and supple, symmetrical, trachea midline  Lungs: CTAB, normal percussion bilaterally & nonlabored, good air movement. Mild upper airway interstitial sounds  Heart: regular rate and rhythm, S1, S2 normal, no murmur, click, rub or gallop and normal apical impulse  Abdomen: soft, non-tender; bowel sounds normal; no masses, no organomegaly  Extremities: extremities normal, atraumatic, no C/C/E, no ulcers, gangrene or trophic changes and mild bruising  Pulses: 2+ and symmetric  Neurologic: Grossly normal   Adult ECG Report  Rate: 59 ;  Rhythm: normal sinus rhythm, premature ventricular contractions (PVC) and Otherwise normal axis, intervals and durations.;   Narrative Interpretation: Relatively normal  EKG. Stable   Other studies Reviewed: Additional studies/ records that were reviewed today include:  Recent Labs:   Lab Results  Component Value Date   CREATININE 0.88 08/06/2015   ASSESSMENT / PLAN: Problem List Items Addressed This Visit    Unstable angina (HCC)    Clearly the episodes that were felt to be unstable angina were not anginal in nature. Okay to stop Imdur.  Relevant Medications   lisinopril (PRINIVIL,ZESTRIL) 10 MG tablet   S/P CABG x 3 (Chronic)    He still has a patent SVG-OM and SVG-D2 that fills both the diagonal and LAD. I reviewed these results with him. The LIMA-LAD graft has now been documented to be relatively atretic and barely fills the LAD distally. I would not waste time trying to inject the future catheterizations.      Relevant Orders   EKG 12-Lead (Completed)   Orthostatic hypotension    This is less pronounced. But I want to continue to reduce his medications. Reducing Toprol now to 50 twice a day, and changing his ACE inhibitor to 5 mg twice a day.      Relevant Medications   lisinopril (PRINIVIL,ZESTRIL) 10 MG tablet   Feeling of chest tightness - at rest (Chronic)    Recent evaluation with cardiac catheterization revealed stable coronary disease. Not likely to be anginal in nature. Okay to stop Imdur.      Fatigue due to treatment (Chronic)    I can't be sure if this is related to anything in particular, but if it is related to medications he should allow more permissive hypertension. Reducing Toprol to 50 twice a day and changing ACE inhibitor to 5 mg twice a day from 10 mg once a day.      Essential hypertension (Chronic)    Well-controlled pressure, however with his decreased energy level/fatigue, I'm going to reduce his Toprol to 50 twice a day as opposed to 50/75. I'm also convert his lisinopril to 5 mg twice a day from 10 once a day      Relevant Medications   lisinopril (PRINIVIL,ZESTRIL) 10 MG tablet   Other Relevant Orders    EKG 12-Lead (Completed)   Dyslipidemia, goal LDL below 70 (Chronic)    LDL was 76 as of February 1610. Well-controlled on Crestor. That is essentially at goal.      Relevant Medications   lisinopril (PRINIVIL,ZESTRIL) 10 MG tablet   Other Relevant Orders   EKG 12-Lead (Completed)   CAD S/P PCI June 2008 - Primary (Chronic)    Widely patent bare metal stent to the PDA by recent cath. Other vessels are filled via patent grafts as indicated. No real anginal symptoms. Remains on aspirin and Plavix. I think we can probably stop aspirin. He is on an ACE inhibitor and beta blocker with adjustments being made as indicated below. He is on Crestor with labs being monitored by PCP.      Relevant Medications   lisinopril (PRINIVIL,ZESTRIL) 10 MG tablet      Current medicines are reviewed at length with the patient today. (+/- concerns) None The following changes have been made:  MAY STOP TAKING IMDUR TO SEE HOW YOU DO ,IF NO CHEST DISCOMFORT THEN STOP  CHANGE LISINOPRIL  1/2 TABLET  TWICE A DAY  CHANGE TAKING METOPROLOL 50 MG TWICE A DAY , MAY USE 25 MG IF NEEDED   Your physician wants you to follow-up in 6 MONTHS WITH DR Jerel Sardina.   Studies Ordered:   Orders Placed This Encounter  Procedures  . EKG 12-Lead      Marykay Lex, M.D., M.S. Interventional Cardiologist   Pager # [phone] Phone # [phone] [address] [place], [place] [address]

## 2015-09-11 ENCOUNTER — Encounter: Payer: Self-pay | Admitting: Cardiology

## 2015-09-11 DIAGNOSIS — R5383 Other fatigue: Secondary | ICD-10-CM | POA: Insufficient documentation

## 2015-09-11 NOTE — Assessment & Plan Note (Signed)
LDL was 76 as of February [id number]. Well-controlled on Crestor. That is essentially at goal.

## 2015-09-11 NOTE — Assessment & Plan Note (Signed)
He still has a patent SVG-OM and SVG-D2 that fills both the diagonal and LAD. I reviewed these results with him. The LIMA-LAD graft has now been documented to be relatively atretic and barely fills the LAD distally. I would not waste time trying to inject the future catheterizations.

## 2015-09-11 NOTE — Assessment & Plan Note (Signed)
Recent evaluation with cardiac catheterization revealed stable coronary disease. Not likely to be anginal in nature. Okay to stop Imdur.

## 2015-09-11 NOTE — Assessment & Plan Note (Signed)
Well-controlled pressure, however with his decreased energy level/fatigue, I'm going to reduce his Toprol to 50 twice a day as opposed to 50/75. I'm also convert his lisinopril to 5 mg twice a day from 10 once a day

## 2015-09-11 NOTE — Assessment & Plan Note (Signed)
I can't be sure if this is related to anything in particular, but if it is related to medications he should allow more permissive hypertension. Reducing Toprol to 50 twice a day and changing ACE inhibitor to 5 mg twice a day from 10 mg once a day.

## 2015-09-11 NOTE — Assessment & Plan Note (Signed)
Widely patent bare metal stent to the PDA by recent cath. Other vessels are filled via patent grafts as indicated. No real anginal symptoms. Remains on aspirin and Plavix. I think we can probably stop aspirin. He is on an ACE inhibitor and beta blocker with adjustments being made as indicated below. He is on Crestor with labs being monitored by PCP.

## 2015-09-11 NOTE — Assessment & Plan Note (Signed)
Clearly the episodes that were felt to be unstable angina were not anginal in nature. Okay to stop Imdur.

## 2015-09-11 NOTE — Assessment & Plan Note (Signed)
This is less pronounced. But I want to continue to reduce his medications. Reducing Toprol now to 50 twice a day, and changing his ACE inhibitor to 5 mg twice a day.

## 2015-09-17 ENCOUNTER — Other Ambulatory Visit: Payer: Self-pay | Admitting: *Deleted

## 2015-10-13 ENCOUNTER — Telehealth: Payer: Self-pay | Admitting: Cardiology

## 2015-10-13 NOTE — Telephone Encounter (Signed)
Pt having PVCs every 3-4 beats at times. He's noted this since last appt. When seen in April, his metoprolol was adjusted down to 50mg  BID and lisinopril increased.  Pt states that about a week ago he made adjustments by increasing metoprolol to 75mg  BID and has stopped taking lisinopril altogether, as his BP has been hovering around 100 systolic. Besides BP running low, he feels "washed out and weak". He doesn't note that his PVCs have improved significantly since re-increasing the metoprolol dose. Pt requests advice from Dr. Herbie BaltimoreHarding. Aware I will defer to physician for advice but we may consider PA office visit most appropriate for further med adjustments.

## 2015-10-13 NOTE — Telephone Encounter (Signed)
Cut out ACE-I.   Continue BB - but try 100 mg IN AM & 75 mg in PM>  Daniel [name], [name], MD

## 2015-10-13 NOTE — Telephone Encounter (Signed)
Returned call and gave patient Dr. Elissa HeftyHarding's recommendations.  He will go up on morning dose of metoprolol to 100mg  and continue the 75mg  evening dose.  He will call back in about a week's time to readdress symptoms if not improved. Advised to call sooner if worse or new problems/concerns.  Pt voiced thanks, agreeable to plan.

## 2015-10-13 NOTE — Telephone Encounter (Signed)
New Meesage   Pt called states that his irregular heart beat has gotten worse. He says after every for beats his heart skips. Please call back to discuss

## 2015-10-15 ENCOUNTER — Ambulatory Visit (INDEPENDENT_AMBULATORY_CARE_PROVIDER_SITE_OTHER): Payer: Medicare PPO | Admitting: Allergy and Immunology

## 2015-10-15 VITALS — BP 110/72 | HR 68 | Resp 14 | Ht 72.84 in | Wt 200.4 lb

## 2015-10-15 DIAGNOSIS — H1010 Acute atopic conjunctivitis, unspecified eye: Secondary | ICD-10-CM

## 2015-10-15 DIAGNOSIS — J329 Chronic sinusitis, unspecified: Secondary | ICD-10-CM

## 2015-10-15 DIAGNOSIS — J309 Allergic rhinitis, unspecified: Secondary | ICD-10-CM | POA: Diagnosis not present

## 2015-10-15 MED ORDER — CEFUROXIME AXETIL 250 MG PO TABS: 250.0000 mg | ORAL_TABLET | Freq: Two times a day (BID) | ORAL | Status: AC

## 2015-10-15 MED ORDER — METHYLPREDNISOLONE ACETATE 80 MG/ML IJ SUSP
80.0000 mg | Freq: Once | INTRAMUSCULAR | Status: AC
  Administered 2015-10-15: 80 mg via INTRAMUSCULAR

## 2015-10-15 NOTE — Progress Notes (Signed)
Follow-up Note  Referring Provider: Geoffry ParadiseAronson, Richard, MD Primary Provider: Minda MeoARONSON,RICHARD A, MD Date of Office Visit: 10/15/2015  Subjective:   Daniel ChurnJames M Gorley (DOB: 06/08/1932) is a 82 y.o. male who [facility] in re-evaluation of the following:  HPI: Daniel [name] returns to this clinic in evaluation of persistent upper airway symptoms. I have not seen him in his clinic since July 2015.  Daniel [name] has been having persistent nasal congestion. He will not use his nasal spray that was prescribed by his insurance company because it burns his nose. He has constant drainage down the back of his throat that is sometimes ugly. He can smell but he has been having headaches on almost a daily basis. About 8 months ago he was given a Z-Pak by Dr. Jacky KindleAronson which did not help him. He does use nasal Astelin which she thinks may help somewhat.    Medication List           aspirin 81 MG tablet  Take 81 mg by mouth daily.     azelastine 0.1 % nasal spray  Commonly known as:  ASTELIN  Place 2 sprays into both nostrils 2 (two) times daily.     cholecalciferol 1000 units tablet  Commonly known as:  VITAMIN D  Take 1,000 Units by mouth daily.     clopidogrel 75 MG tablet  Commonly known as:  PLAVIX  Take 75 mg by mouth daily.     finasteride 5 MG tablet  Commonly known as:  PROSCAR  Take 10 mg by mouth daily.     flunisolide 25 MCG/ACT (0.025%) Soln  Commonly known as:  NASALIDE  Place 1 spray into the nose 2 (two) times daily.     isosorbide mononitrate 30 MG 24 hr tablet  Commonly known as:  IMDUR  Take 30 mg by mouth daily.     lisinopril 10 MG tablet  Commonly known as:  PRINIVIL,ZESTRIL  Take 0.5 tablets (5 mg total) by mouth 2 (two) times daily.     metoprolol succinate 50 MG 24 hr tablet  Commonly known as:  TOPROL-XL  Take 50-75 mg by mouth 2 (two) times daily. 75mg  in the morning, 50mg  in the evening     montelukast 10 MG tablet  Commonly  known as:  SINGULAIR  Take 10 mg by mouth at bedtime.     NITROSTAT 0.4 MG SL tablet  Generic drug:  nitroGLYCERIN  place 1 tablet under the tongue every 5 minutes if needed for chest pain     rosuvastatin 10 MG tablet  Commonly known as:  CRESTOR  Take 1 tablet (10 mg total) by mouth 3 (three) times a week.     tamsulosin 0.4 MG Caps capsule  Commonly known as:  FLOMAX  Take 1 capsule by mouth at bedtime.     traMADol 50 MG tablet  Commonly known as:  ULTRAM  Take 100-200 mg by mouth daily as needed for moderate pain. Take 2-4 tablets daily     zolpidem 10 MG tablet  Commonly known as:  AMBIEN  Take 5 mg by mouth at bedtime as needed for sleep.        Past Medical History  Diagnosis Date  . Coronary artery disease involving left main coronary artery 1997-2010    Last cath 2010: 70-80 % ostial left main, 80-90% LAD, subtotal occluded circumflex, RCA now a patent stent; SVG-diagonal backfills LAD, atretic LIMA-LAD -- medical therapy; stable by  relook cath in March 2017  . S/P CABG x 3 1997    LIMA-LAD, SVG-D2, SVG-OM --> LIMA known to be atretic, but SVG-D2 fills diagonal and LAD  . CAD S/P percutaneous coronary angioplasty June 2008;     a. 6/'08: PCI nongrafted distal RCA-PDA: Mini vision BMS 2.25 mm x 28 mm; b. Relook Cath 08/2015: Stable CAD. No culprit lesion. Widely patent PDA stent. Patent SVG-OM 2, SVG-D2. Essentially atretic/small LIMA-LAD, but the LAD is perfused via SVG-D2  . CAD of autologous arterial graft 2010    Atretic LIMA; Myoview Jan 2015: Possible mild basal anteroseptal defect thought to be artifact (CRO early LAD ischemia due to inadeuate retrograde perfusion via SVG-Diag  . Hypertension   . Dyslipidemia, goal LDL below 70   . Heart palpitations     Never fully diagnosed.  . Insomnia     Past Surgical History  Procedure Laterality Date  . Coronary artery bypass graft  2/191997    LIMA - LAD, SVG- diagonal, SVG-OM  . Cardiac catheterization  June 2008     showed high grade distal RCA and PDA stenosis and underwent PCI with a 2.25 mm x 28 mm Mini Vision bare- metal stent.  . Cardiac catheterization  2010    Native coronaries at 70% to 80% ostial left main. LAD had diffuse 80% to 90% stenosis  . Nm myoview ltd  02/25/2004; Jan 2015    a) EF 57%; b)  Normal Nuclear Stress Test - No evidence of Ischemia or Infarction. Normal LV Function & wall motion.  . Cardiac catheterization N/A 08/08/2015    Procedure: Left Heart Cath and Cors/Grafts Angiography;  Surgeon: Marykay Lex, MD;  Location: [facility] INVASIVE CV LAB;  Service: Cardiovascular;  Laterality: N/A;    Allergies  Allergen Reactions  . Penicillins     Swelling and redness in joints  . Statins     At high dose  . Sulfa Antibiotics     Pain in left side underneath ribcage-pancreas    Review of systems negative except as noted in HPI / PMHx or noted below:  Review of Systems  Constitutional: Negative.   HENT: Negative.   Eyes: Negative.   Respiratory: Negative.   Cardiovascular: Negative.   Gastrointestinal: Negative.   Genitourinary: Negative.   Musculoskeletal: Negative.   Skin: Negative.   Neurological: Negative.   Endo/Heme/Allergies: Negative.   Psychiatric/Behavioral: Negative.      Objective:   Filed Vitals:   10/15/15 0912  BP: 110/72  Pulse: 68  Resp: 14   Height: 6' 0.83" (185 cm)  Weight: 200 lb 6.4 oz (90.9 kg)   Physical Exam  Constitutional: He is well-developed, well-nourished, and in no distress.  Slightly nasal voice  HENT:  Head: Normocephalic.  Right Ear: Tympanic membrane, external ear and ear canal normal.  Left Ear: Tympanic membrane, external ear and ear canal normal.  Nose: Nose normal. No mucosal edema or rhinorrhea.  Mouth/Throat: Uvula is midline, oropharynx is clear and moist and mucous membranes are normal. No oropharyngeal exudate.  Eyes: Conjunctivae are normal.  Neck: Trachea normal. No tracheal tenderness present. No tracheal  deviation present. No thyromegaly present.  Cardiovascular: Normal rate, regular rhythm, S1 normal, S2 normal and normal heart sounds.   No murmur heard. Pulmonary/Chest: Breath sounds normal. No stridor. No respiratory distress. He has no wheezes. He has no rales.  Musculoskeletal: He exhibits no edema.  Lymphadenopathy:       Head (right side): No tonsillar adenopathy present.  Head (left side): No tonsillar adenopathy present.    He has no cervical adenopathy.  Neurological: He is alert. Gait normal.  Skin: No rash noted. He is not diaphoretic. No erythema. Nails show no clubbing.  Psychiatric: Mood and affect normal.    Diagnostics: None    Assessment and Plan:   1. Allergic rhinoconjunctivitis   2. Chronic sinusitis, unspecified location     1. Depo-Medrol 80 IM delivered in clinic  2. Ceftin 250 one tablet twice a day for a full 20 days  3. OTC Rhinocort one spray each nostril once a day. Coupon.  4. Montelukast 10 mg one tablet once a day  5. Can continue nasal Azelastine 2 sprays each nostril twice a day  6. Can continue nasal saline washes  7. Further evaluation and treatment?  8. Return to clinic in 4 weeks or earlier if problem  I think Daniel Ashing has probably slid back into a component of chronic sinusitis and we will treat him with the therapy mentioned above and regroup with him in 4 weeks to make sure that he has cleared out this most recent issue. As well, he needs to use some type of nasal steroid on a regular basis and he will try over-the-counter Rhinocort at this point in time. He was being treated successfully with Qnasl but his insurance company has excluded this medication from its formulary. He'll contact me during the interval should there be a significant problem.  Laurette Schimke, MD [facility]

## 2015-10-15 NOTE — Patient Instructions (Addendum)
  1. Depo-Medrol 80 IM delivered in clinic  2. Ceftin 250 one tablet twice a day for a full 20 days  3. OTC Rhinocort one spray each nostril once a day. Coupon.  4. Montelukast 10 mg one tablet once a day  5. Can continue nasal Azelastine 2 sprays each nostril twice a day  6. Can continue nasal saline washes  7. Further evaluation and treatment?  8. Return to clinic in 4 weeks or earlier if problem

## 2015-10-16 ENCOUNTER — Encounter: Payer: Self-pay | Admitting: Allergy and Immunology

## 2015-11-12 ENCOUNTER — Ambulatory Visit (INDEPENDENT_AMBULATORY_CARE_PROVIDER_SITE_OTHER): Payer: Medicare PPO | Admitting: Allergy and Immunology

## 2015-11-12 ENCOUNTER — Encounter: Payer: Self-pay | Admitting: Allergy and Immunology

## 2015-11-12 VITALS — BP 120/70 | HR 60 | Resp 18

## 2015-11-12 DIAGNOSIS — J309 Allergic rhinitis, unspecified: Secondary | ICD-10-CM | POA: Diagnosis not present

## 2015-11-12 DIAGNOSIS — R519 Headache, unspecified: Secondary | ICD-10-CM

## 2015-11-12 DIAGNOSIS — R51 Headache: Secondary | ICD-10-CM | POA: Diagnosis not present

## 2015-11-12 DIAGNOSIS — H1010 Acute atopic conjunctivitis, unspecified eye: Secondary | ICD-10-CM

## 2015-11-12 DIAGNOSIS — J329 Chronic sinusitis, unspecified: Secondary | ICD-10-CM | POA: Diagnosis not present

## 2015-11-12 NOTE — Progress Notes (Signed)
Follow-up Note  Referring Provider: Geoffry Paradise, MD Primary Provider: Minda Meo, MD Date of Office Visit: 11/12/2015  Subjective:   Daniel [name] (DOB: 1933-05-30) is a 82 y.o. male who [facility] in re-evaluation of the following:  HPI: Daniel [name] returns to this clinic in reevaluation of his presumed episode of chronic sinusitis and allergic rhinoconjunctivitis. He is better with medical therapy prescribed on 04/14/2016. He has much less congestion in his head. His drainage is better. His headache is better and he is now only having a headache about twice a week. He did use medical therapy including his Ceftin but unfortunately he can only use 12 days of Ceftin because his stomach got somewhat upset utilizing this medication. Fortunately, his stomach upset has resolved.    Medication List           aspirin 81 MG tablet  Take 81 mg by mouth daily.     azelastine 0.1 % nasal spray  Commonly known as:  ASTELIN  Place 2 sprays into both nostrils 2 (two) times daily.     cholecalciferol 1000 units tablet  Commonly known as:  VITAMIN D  Take 1,000 Units by mouth daily.     clopidogrel 75 MG tablet  Commonly known as:  PLAVIX  Take 75 mg by mouth daily.     finasteride 5 MG tablet  Commonly known as:  PROSCAR  Take 10 mg by mouth daily.     flunisolide 25 MCG/ACT (0.025%) Soln  Commonly known as:  NASALIDE  Place 1 spray into the nose 2 (two) times daily.     isosorbide mononitrate 30 MG 24 hr tablet  Commonly known as:  IMDUR  Take 30 mg by mouth daily.     metoprolol succinate 50 MG 24 hr tablet  Commonly known as:  TOPROL-XL  Take 50-75 mg by mouth 2 (two) times daily.  in the morning,  in the evening     montelukast 10 MG tablet  Commonly known as:  SINGULAIR  Take 10 mg by mouth at bedtime.     NITROSTAT 0.4 MG SL tablet  Generic drug:  nitroGLYCERIN  place 1 tablet under the tongue every 5 minutes  if needed for chest pain     tamsulosin 0.4 MG Caps capsule  Commonly known as:  FLOMAX  Take 1 capsule by mouth at bedtime.     traMADol 50 MG tablet  Commonly known as:  ULTRAM  Take 100-200 mg by mouth daily as needed for moderate pain. Take 2-4 tablets daily     zolpidem 10 MG tablet  Commonly known as:  AMBIEN  Take 5 mg by mouth at bedtime as needed for sleep.        Past Medical History  Diagnosis Date  . Coronary artery disease involving left main coronary artery 1997-2010    Last cath 2010: 70-80 % ostial left main, 80-90% LAD, subtotal occluded circumflex, RCA now a patent stent; SVG-diagonal backfills LAD, atretic LIMA-LAD -- medical therapy; stable by relook cath in March 2017  . S/P CABG x 3 1997    LIMA-LAD, SVG-D2, SVG-OM --> LIMA known to be atretic, but SVG-D2 fills diagonal and LAD  . CAD S/P percutaneous coronary angioplasty June 2008;     a. 6/'08: PCI nongrafted distal RCA-PDA: Mini vision BMS 2.25 mm x 28 mm; b. Relook Cath 08/2015: Stable CAD. No culprit lesion. Widely patent PDA stent. Patent SVG-OM 2, SVG-D2.  Essentially atretic/small LIMA-LAD, but the LAD is perfused via SVG-D2  . CAD of autologous arterial graft 2010    Atretic LIMA; Myoview Jan 2015: Possible mild basal anteroseptal defect thought to be artifact (CRO early LAD ischemia due to inadeuate retrograde perfusion via SVG-Diag  . Hypertension   . Dyslipidemia, goal LDL below 70   . Heart palpitations     Never fully diagnosed.  . Insomnia     Past Surgical History  Procedure Laterality Date  . Coronary artery bypass graft  2/191997    LIMA - LAD, SVG- diagonal, SVG-OM  . Cardiac catheterization  June 2008    showed high grade distal RCA and PDA stenosis and underwent PCI with a 2.25 mm x 28 mm Mini Vision bare- metal stent.  . Cardiac catheterization  2010    Native coronaries at 70% to 80% ostial left main. LAD had diffuse 80% to 90% stenosis  . Nm myoview ltd  02/25/2004; Jan 2015    a)  EF 57%; b)  Normal Nuclear Stress Test - No evidence of Ischemia or Infarction. Normal LV Function & wall motion.  . Cardiac catheterization N/A 08/08/2015    Procedure: Left Heart Cath and Cors/Grafts Angiography;  Surgeon: Marykay Lexavid W Harding, MD;  Location: [facility] INVASIVE CV LAB;  Service: Cardiovascular;  Laterality: N/A;    Allergies  Allergen Reactions  . Penicillins     Swelling and redness in joints  . Statins     At high dose  . Sulfa Antibiotics     Pain in left side underneath ribcage-pancreas    Review of systems negative except as noted in HPI / PMHx or noted below:  Review of Systems  Constitutional: Negative.   HENT: Negative.   Eyes: Negative.   Respiratory: Negative.   Cardiovascular: Negative.   Gastrointestinal: Negative.   Genitourinary: Negative.   Musculoskeletal: Negative.   Skin: Negative.   Neurological: Negative.   Endo/Heme/Allergies: Negative.   Psychiatric/Behavioral: Negative.      Objective:   Filed Vitals:   11/12/15 1108  BP: 120/70  Pulse: 60  Resp: 18          Physical Exam  Constitutional: He is well-developed, well-nourished, and in no distress.  HENT:  Head: Normocephalic.  Right Ear: Tympanic membrane, external ear and ear canal normal.  Left Ear: Tympanic membrane, external ear and ear canal normal.  Nose: Nose normal. No mucosal edema or rhinorrhea.  Mouth/Throat: Uvula is midline, oropharynx is clear and moist and mucous membranes are normal. No oropharyngeal exudate.  Eyes: Conjunctivae are normal.  Neck: Trachea normal. No tracheal tenderness present. No tracheal deviation present. No thyromegaly present.  Cardiovascular: Normal rate, regular rhythm, S1 normal, S2 normal and normal heart sounds.   No murmur heard. Pulmonary/Chest: Breath sounds normal. No stridor. No respiratory distress. He has no wheezes. He has no rales.  Musculoskeletal: He exhibits no edema.  Lymphadenopathy:       Head (right side): No tonsillar  adenopathy present.       Head (left side): No tonsillar adenopathy present.    He has no cervical adenopathy.  Neurological: He is alert. Gait normal.  Skin: No rash noted. He is not diaphoretic. No erythema. Nails show no clubbing.  Psychiatric: Mood and affect normal.    Diagnostics: None     Assessment and Plan:   1. Allergic rhinoconjunctivitis   2. Chronic sinusitis, unspecified location   3. Headache disorder     1. Keep headache log  2. OTC Rhinocort one spray each nostril once a day.  3. Montelukast 10 mg one tablet once a day  4. Can continue nasal Azelastine 2 sprays each nostril twice a day  5. Can continue nasal saline washes  6. Further evaluation and treatment?  7. Return to clinic in 6 months or earlier if problem  Daniel [name] is better regarding his respiratory tract but he still has headaches. He is very satisfied with the response that he has received at this point in time including his headache frequency. However, I did have a talk with him today about his headache issue and I've asked him to keep a very close eye on his headache frequency and if he does not resolve his headaches completely over the course of the next several weeks to a month then he may require a limited sinus CT scan to further evaluate whether or not he still has chronic sinusitis. His last CT scan of the sinuses was greater than a decade ago and he can't really remember the results of that CT scan. For now he'll stay on anti-inflammatory medications as noted above and I'll see him back in this clinic in 6 months or earlier unless of course there is a problem.  Laurette Schimke, MD [facility]

## 2015-11-12 NOTE — Patient Instructions (Signed)
  1. Keep headache log  2. OTC Rhinocort one spray each nostril once a day.  3. Montelukast 10 mg one tablet once a day  4. Can continue nasal Azelastine 2 sprays each nostril twice a day  5. Can continue nasal saline washes  6. Further evaluation and treatment?  7. Return to clinic in 6 months or earlier if problem

## 2016-03-16 ENCOUNTER — Other Ambulatory Visit: Payer: Self-pay | Admitting: Cardiology

## 2016-05-18 ENCOUNTER — Ambulatory Visit (INDEPENDENT_AMBULATORY_CARE_PROVIDER_SITE_OTHER): Payer: Medicare PPO | Admitting: Allergy and Immunology

## 2016-05-18 ENCOUNTER — Encounter: Payer: Self-pay | Admitting: Allergy and Immunology

## 2016-05-18 VITALS — BP 138/72 | HR 80 | Resp 20

## 2016-05-18 DIAGNOSIS — J3089 Other allergic rhinitis: Secondary | ICD-10-CM | POA: Diagnosis not present

## 2016-05-18 DIAGNOSIS — J329 Chronic sinusitis, unspecified: Secondary | ICD-10-CM

## 2016-05-18 NOTE — Patient Instructions (Signed)
  1. Prednisone 10mg  one tablet one time per day for five days  2. OTC Rhinocort one spray each nostril once a day.  3. Montelukast 10 mg one tablet once a day  4. Can continue nasal Azelastine 2 sprays each nostril twice a day  5. Can continue nasal saline washes  6. Return to clinic in 6 months or earlier if problem

## 2016-05-18 NOTE — Progress Notes (Signed)
Follow-up Note  Referring Provider: Geoffry [name], Richard, MD Primary Provider: Minda [name],Daniel [name] A, MD Date of Office Visit: 05/18/2016  Subjective:   Daniel [name] (DOB: 09/29/1932) is [name] 82 y.o. male who [facility]2016 in re-evaluation of the following:  HPI: Daniel AshingJim returns to this clinic in reevaluation of his history of chronic sinusitis and allergic rhinoconjunctivitis. I last saw him in this clinic 6 months ago.  He has done relatively well on [name] large collection of medical therapy including [name] nasal steroid and [name] leukotriene modifier and nasal antihistamines. Unfortunately, about 2 weeks ago he developed issues with very significant nasal congestion and sneezing and then developed ugly green nasal discharge and green postnasal drip for which he received azithromycin Z-Pak from his primary care doctor. He just finished that medication today. He is slightly better today.  He did receive the flu vaccine this fall    Medication List      aspirin 81 MG tablet Take 81 mg by mouth daily.   azelastine 0.1 % nasal spray Commonly known as:  ASTELIN Place 2 sprays into both nostrils 2 (two) times daily.   cholecalciferol 1000 units tablet Commonly known as:  VITAMIN D Take 1,000 Units by mouth daily.   clopidogrel 75 MG tablet Commonly known as:  PLAVIX Take 75 mg by mouth daily.   DULoxetine 20 MG capsule Commonly known as:  CYMBALTA Take 20 mg by mouth daily.   finasteride 5 MG tablet Commonly known as:  PROSCAR Take 10 mg by mouth daily.   isosorbide mononitrate 30 MG 24 hr tablet Commonly known as:  IMDUR Take 30 mg by mouth daily.   metoprolol succinate 50 MG 24 hr tablet Commonly known as:  TOPROL-XL Take 50-75 mg by mouth 2 (two) times daily. 75mg  in the morning, 50mg  in the evening   montelukast 10 MG tablet Commonly known as:  SINGULAIR Take 10 mg by mouth at bedtime.   NITROSTAT 0.4 MG SL tablet Generic drug:   nitroGLYCERIN place 1 tablet under the tongue every 5 minutes if needed for chest pain   RHINOCORT ALLERGY 32 MCG/ACT nasal spray Generic drug:  budesonide Place 1 spray into both nostrils daily.   tamsulosin 0.4 MG Caps capsule Commonly known as:  FLOMAX Take 1 capsule by mouth at bedtime.   traMADol 50 MG tablet Commonly known as:  ULTRAM Take 100-200 mg by mouth daily as needed for moderate pain. Take 2-4 tablets daily   vitamin B-12 1000 MCG tablet Commonly known as:  CYANOCOBALAMIN Take 1,000 mcg by mouth daily.   zolpidem 10 MG tablet Commonly known as:  AMBIEN Take 5 mg by mouth at bedtime as needed for sleep.       Past Medical History:  Diagnosis Date  . CAD of autologous arterial graft 2010   Atretic LIMA; Myoview Jan 2015: Possible mild basal anteroseptal defect thought to be artifact (CRO early LAD ischemia due to inadeuate retrograde perfusion via SVG-Diag  . CAD S/P percutaneous coronary angioplasty June 2008;    [name]. 6/'08: PCI nongrafted distal RCA-PDA: Mini vision BMS 2.25 mm x 28 mm; b. Relook Cath 08/2015: Stable CAD. No culprit lesion. Widely patent PDA stent. Patent SVG-OM 2, SVG-D2. Essentially atretic/small LIMA-LAD, but the LAD is perfused via SVG-D2  . Coronary artery disease involving left main coronary artery 1997-2010   Last cath 2010: 70-80 % ostial left main, 80-90% LAD, subtotal occluded circumflex, RCA now [name] patent stent;  SVG-diagonal backfills LAD, atretic LIMA-LAD -- medical therapy; stable by relook cath in March 2017  . Dyslipidemia, goal LDL below 70   . Heart palpitations    Never fully diagnosed.  . Hypertension   . Insomnia   . S/P CABG x 3 1997   LIMA-LAD, SVG-D2, SVG-OM --> LIMA known to be atretic, but SVG-D2 fills diagonal and LAD    Past Surgical History:  Procedure Laterality Date  . CARDIAC CATHETERIZATION  June 2008   showed high grade distal RCA and PDA stenosis and underwent PCI with [name] 2.25 mm x 28 mm Mini Vision bare- metal  stent.  [name] CARDIAC CATHETERIZATION  2010   Native coronaries at 70% to 80% ostial left main. LAD had diffuse 80% to 90% stenosis  . CARDIAC CATHETERIZATION N/[name] 08/08/2015   Procedure: Left Heart Cath and Cors/Grafts Angiography;  Surgeon: Marykay Lex, MD;  Location: [facility] INVASIVE CV LAB;  Service: Cardiovascular;  Laterality: N/[name];  . CORONARY ARTERY BYPASS GRAFT  [id number]   LIMA - LAD, SVG- diagonal, SVG-OM  . NM MYOVIEW LTD  02/25/2004; Jan 2015   [name]) EF 57%; b)  Normal Nuclear Stress Test - No evidence of Ischemia or Infarction. Normal LV Function & wall motion.    Allergies  Allergen Reactions  . Penicillins     Swelling and redness in joints  . Statins     At high dose  . Sulfa Antibiotics     Pain in left side underneath ribcage-pancreas    Review of systems negative except as noted in HPI / PMHx or noted below:  Review of Systems  Constitutional: Negative.   HENT: Negative.   Eyes: Negative.   Respiratory: Negative.   Cardiovascular: Negative.   Gastrointestinal: Negative.   Genitourinary: Negative.   Musculoskeletal: Negative.   Skin: Negative.   Neurological: Negative.   Endo/Heme/Allergies: Negative.   Psychiatric/Behavioral: Negative.      Objective:   Vitals:   05/18/16 1541  BP: 138/72  Pulse: 80  Resp: 20          Physical Exam  Constitutional: He is well-developed, well-nourished, and in no distress.  HENT:  Head: Normocephalic.  Right Ear: Tympanic membrane, external ear and ear canal normal.  Left Ear: Tympanic membrane, external ear and ear canal normal.  Nose: Nose normal. No mucosal edema or rhinorrhea.  Mouth/Throat: Uvula is midline, oropharynx is clear and moist and mucous membranes are normal. No oropharyngeal exudate.  Eyes: Conjunctivae are normal.  Neck: Trachea normal. No tracheal tenderness present. No tracheal deviation present. No thyromegaly present.  Cardiovascular: Normal rate, regular rhythm, S1 normal, S2 normal and normal  heart sounds.   No murmur heard. Pulmonary/Chest: Breath sounds normal. No stridor. No respiratory distress. He has no wheezes. He has no rales.  Musculoskeletal: He exhibits no edema.  Lymphadenopathy:       Head (right side): No tonsillar adenopathy present.       Head (left side): No tonsillar adenopathy present.    He has no cervical adenopathy.  Neurological: He is alert. Gait normal.  Skin: No rash noted. He is not diaphoretic. No erythema. Nails show no clubbing.  Psychiatric: Mood and affect normal.    Diagnostics: None   Assessment and Plan:   1. Chronic sinusitis, unspecified location   2. Other allergic rhinitis     1. Prednisone 10mg  one tablet one time per day for five days  2. OTC Rhinocort one spray each nostril once [name] day.  3. Montelukast 10 mg one tablet once [name] day  4. Can continue nasal Azelastine 2 sprays each nostril twice [name] day  5. Can continue nasal saline washes  6. Return to clinic in 6 months or earlier if problem  I will assume that Daniel AshingJim will do well on his current medical therapy which includes [name] very low dose of systemic steroids to add into his current antibiotic used to treat what appears to be [name] respiratory tract infection. If he continues to do well in the face of this plan I can see him back in this clinic in 6 months or earlier if there is [name] problem.  Laurette SchimkeEric Lataja Newland, MD [facility]

## 2016-05-20 ENCOUNTER — Telehealth (INDEPENDENT_AMBULATORY_CARE_PROVIDER_SITE_OTHER): Payer: Self-pay | Admitting: *Deleted

## 2016-05-20 NOTE — Telephone Encounter (Signed)
Pt called to get copy of his last xrays on his knees and his back.

## 2016-05-21 NOTE — Telephone Encounter (Signed)
Will you make cd of patients xrays on knee and low back.

## 2016-07-08 ENCOUNTER — Other Ambulatory Visit: Payer: Self-pay | Admitting: Allergy and Immunology

## 2016-07-13 DIAGNOSIS — M25561 Pain in right knee: Secondary | ICD-10-CM

## 2016-07-13 DIAGNOSIS — M25562 Pain in left knee: Secondary | ICD-10-CM

## 2016-07-13 DIAGNOSIS — G8929 Other chronic pain: Secondary | ICD-10-CM | POA: Insufficient documentation

## 2016-08-13 ENCOUNTER — Encounter: Payer: Self-pay | Admitting: Cardiology

## 2016-08-13 ENCOUNTER — Ambulatory Visit (INDEPENDENT_AMBULATORY_CARE_PROVIDER_SITE_OTHER): Payer: Medicare PPO | Admitting: Cardiology

## 2016-08-13 VITALS — BP 171/76 | HR 61 | Ht 75.0 in | Wt 200.0 lb

## 2016-08-13 DIAGNOSIS — R002 Palpitations: Secondary | ICD-10-CM | POA: Diagnosis not present

## 2016-08-13 DIAGNOSIS — Z9861 Coronary angioplasty status: Secondary | ICD-10-CM

## 2016-08-13 DIAGNOSIS — I2510 Atherosclerotic heart disease of native coronary artery without angina pectoris: Secondary | ICD-10-CM

## 2016-08-13 DIAGNOSIS — Z01810 Encounter for preprocedural cardiovascular examination: Secondary | ICD-10-CM | POA: Diagnosis not present

## 2016-08-13 DIAGNOSIS — E785 Hyperlipidemia, unspecified: Secondary | ICD-10-CM

## 2016-08-13 DIAGNOSIS — I10 Essential (primary) hypertension: Secondary | ICD-10-CM | POA: Diagnosis not present

## 2016-08-13 DIAGNOSIS — I200 Unstable angina: Secondary | ICD-10-CM

## 2016-08-13 NOTE — Patient Instructions (Signed)
No change with current medications   Okay for knee surgery    Your physician recommends that you schedule a follow-up appointment in 6 months with Azalee CourseHao Meng PA      Your physician wants you to follow-up in 12 MONTH WITH DR HARDING. You will receive a reminder letter in the mail two months in advance. If you don't receive a letter, please call our office to schedule the follow-up appointment.    If you need a refill on your cardiac medications before your next appointment, please call your pharmacy.

## 2016-08-13 NOTE — Progress Notes (Signed)
PCP: Minda Meo, MD  Clinic Note: Chief Complaint  Patient presents with  . Follow-up    CAD  . Pre-op Exam    Knee surgery    HPI: Daniel [name] is a 83 y.o. male with a PMH below who presents today for annual follow-up of his coronary disease status post CABG. He is a former patient Dr. Julieanne Manson with a distant history of CAD-CABG dating back to 1997 in response to left main disease. He has a known atretic LIMA but the LAD fills via the SVG-D2. He had mild chronic angina in the past, but was evaluated by cardiac catheterization showing stable disease last year.  [name] was last seen on April 2017  Recent Hospitalizations: None  Studies Reviewed: None since March 2017  Interval History: [name] presents today actually doing quite well. Has not really had any significant symptoms whatsoever. He says he occasional has some heart rates did skip beats. He tried to reduce down the Toprol to the 50 twice a day but had to go back to taking the 75 mg in the morning. This pretty much alleviated the palpitations. He is now limited from an activity standpoint by significant knee pain and is planning on having both of his knee surgeries done. This is partly a preop visit. From a cardiac standpoint besides the intermittent palpitations, he denies any significant recurrence of resting or exertional chest tightness or pressure. No resting or exertional dyspnea. -- He is quite active, however is now being limited by knee pain.  No PND, orthopnea or edema.  No lightheadedness, dizziness, weakness or syncope/near syncope. No TIA/amaurosis fugax symptoms. No claudication.  ROS: A comprehensive was performed. Review of Systems  Constitutional: Negative for malaise/fatigue.  HENT: Negative for congestion and nosebleeds.   Respiratory: Negative for cough and shortness of breath.   Gastrointestinal: Negative for blood in stool.  Musculoskeletal: Positive for joint pain (Bilateral  knee pain). Negative for back pain.  Psychiatric/Behavioral: Negative.  Negative for memory loss. The patient is not nervous/anxious.   All other systems reviewed and are negative.    Past Medical History:  Diagnosis Date  . CAD of autologous arterial graft 2010   Atretic LIMA; Myoview Jan 2015: Possible mild basal anteroseptal defect thought to be artifact (CRO early LAD ischemia due to inadeuate retrograde perfusion via SVG-Diag  . CAD S/P percutaneous coronary angioplasty June 2008;    a. 6/'08: PCI nongrafted distal RCA-PDA: Mini vision BMS 2.25 mm x 28 mm; b. Relook Cath 08/2015: Stable CAD. No culprit lesion. Widely patent PDA stent. Patent SVG-OM 2, SVG-D2. Essentially atretic/small LIMA-LAD, but the LAD is perfused via SVG-D2  . Coronary artery disease involving left main coronary artery 1997-2010   Last cath 2010: 70-80 % ostial left main, 80-90% LAD, subtotal occluded circumflex, RCA now a patent stent; SVG-diagonal backfills LAD, atretic LIMA-LAD -- medical therapy; stable by relook cath in March 2017  . Dyslipidemia, goal LDL below 70   . Heart palpitations    Never fully diagnosed.  . Hypertension   . Insomnia   . S/P CABG x 3 1997   LIMA-LAD, SVG-D2, SVG-OM --> LIMA known to be atretic, but SVG-D2 fills diagonal and LAD    Past Surgical History:  Procedure Laterality Date  . CARDIAC CATHETERIZATION  June 2008   showed high grade distal RCA and PDA stenosis and underwent PCI with a 2.25 mm x 28 mm Mini Vision bare- metal stent.  [name] CARDIAC CATHETERIZATION  2010   Native coronaries at 70% to 80% ostial left main. LAD had diffuse 80% to 90% stenosis  . CARDIAC CATHETERIZATION N/A 08/08/2015   Procedure: Left Heart Cath and Cors/Grafts Angiography;  Surgeon: Marykay Lex, MD;  Location: [facility] INVASIVE CV LAB;  Service: Cardiovascular;  Laterality: N/A;  . CORONARY ARTERY BYPASS GRAFT  [id number]   LIMA - LAD, SVG- diagonal, SVG-OM  . NM MYOVIEW LTD  02/25/2004; Jan 2015   a) EF  57%; b)  Normal Nuclear Stress Test - No evidence of Ischemia or Infarction. Normal LV Function & wall motion.   Diagnostic Diagram - Cath 08/2015: Stable disease from 2010. No culprit lesion for chest pain. Ischemia potentially related to Rash flow in OM 1 or D1 based on left main and LAD stenosis.     Current Meds  Medication Sig  . aspirin 81 MG tablet Take 81 mg by mouth daily.   [name] azelastine (ASTELIN) 0.1 % nasal spray instill 2 sprays into each nostril twice a day  . budesonide (RHINOCORT ALLERGY) 32 MCG/ACT nasal spray Place 1 spray into both nostrils daily.  . cholecalciferol (VITAMIN D) 1000 units tablet Take 1,000 Units by mouth daily.  . clopidogrel (PLAVIX) 75 MG tablet Take 75 mg by mouth daily.  . DULoxetine (CYMBALTA) 20 MG capsule Take 20 mg by mouth daily.  . isosorbide mononitrate (IMDUR) 30 MG 24 hr tablet Take 30 mg by mouth daily.  . metoprolol succinate (TOPROL-XL) 50 MG 24 hr tablet Take 50-75 mg by mouth 2 (two) times daily. 75mg  in the morning, 50mg  in the evening  . montelukast (SINGULAIR) 10 MG tablet Take 10 mg by mouth at bedtime.  [name] NITROSTAT 0.4 MG SL tablet place 1 tablet under the tongue every 5 minutes if needed for chest pain  . rosuvastatin (CRESTOR) 10 MG tablet Take 10 mg by mouth 3 (three) times a week.  . tamsulosin (FLOMAX) 0.4 MG CAPS capsule Take 1 capsule by mouth at bedtime.   . traMADol (ULTRAM) 50 MG tablet Take 100-200 mg by mouth daily as needed for moderate pain. Take 2-4 tablets daily  . vitamin B-12 (CYANOCOBALAMIN) 1000 MCG tablet Take 1,000 mcg by mouth daily.  [name] zolpidem (AMBIEN) 10 MG tablet Take 5 mg by mouth at bedtime as needed for sleep.    Allergies  Allergen Reactions  . Penicillins     Swelling and redness in joints  . Statins     At high dose  . Sulfa Antibiotics     Pain in left side underneath ribcage-pancreas    Social History   Social History  . Marital status: Married    Spouse name: N/A  . Number of  children: N/A  . Years of education: N/A   Social History Main Topics  . Smoking status: Never Smoker  . Smokeless tobacco: Never Used  . Alcohol use No  . Drug use: No  . Sexual activity: Not Asked   Other Topics Concern  . None   Social History Narrative   He is a married father of 2, grandfather 5. He is retired from [employer]. He travels back and forth between here and [place], [place] where part of his family lives.  He is usually very active, but is limited as far as standing exercise by his knee osteoarthritis.   He does not drink and does not smoke. Never smoked.    family history is not on file.  Wt Readings from Last 3 Encounters:  08/13/16  90.7 kg (200 lb)  10/15/15 90.9 kg (200 lb 6.4 oz)  09/09/15 89.4 kg (197 lb 3.2 oz)    PHYSICAL EXAM BP (!) 171/76 (BP Location: Right Arm)   Pulse 61   Ht 6\' 3"  (1.905 m)   Wt 90.7 kg (200 lb)   BMI 25.00 kg/m  - BP recheck 140/74 (was upset about being in traffic) General appearance: alert and oriented x3, cooperative, appears stated age, no distress and healthy appearing, pleasant gentleman. Well-nourished and well-groomed. Answers questions appropriately.  HEENT: [place]/AT, EOMI, MMM, anicteric sclera; still has mild sinus tenderness  Neck: no carotid bruit, no JVD and supple, symmetrical, trachea midline  Lungs: CTAB, normal percussion bilaterally & nonlabored, good air movement. Mild upper airway interstitial sounds  Heart: regular rate and rhythm, S1, S2 normal, no murmur, click, rub or gallop and normal apical impulse  Abdomen: soft, non-tender; bowel sounds normal; no masses, no organomegaly  Extremities: extremities normal, atraumatic, no C/C/E, no ulcers, gangrene or trophic changes and mild bruising  Pulses: 2+ and symmetric  Neurologic: Grossly normal    Adult ECG Report  Rate: 61 ;  Rhythm: normal sinus rhythm and Nonspecific ST and T-wave changes. Otherwise normal axis, intervals and durations.   Narrative Interpretation: Stable EKG.   Other studies Reviewed: Additional studies/ records that were reviewed today include:  Recent Labs:  Labs checked by PCP this month - Chol looked better.   ASSESSMENT / PLAN: Problem List Items Addressed This Visit    CAD S/P PCI June 2008 (Chronic)    Distant CABG followed by a bare-metal stent placement to the ungrafted PDA in 2008. Stent was widely patent in 2010 and 2017. Widely patent grafts. No real change from 2010-2017. No active anginal symptoms. Plan would be to discontinue aspirin and continue Plavix. Continue beta blocker, statin and ACE inhibitor at current doses.      Relevant Medications   rosuvastatin (CRESTOR) 10 MG tablet   Other Relevant Orders   EKG 12-Lead   Dyslipidemia, goal LDL below 70 (Chronic)    Fairly well-controlled last February. Remains on Crestor 3 days a week. Followed by PCP.      Relevant Medications   rosuvastatin (CRESTOR) 10 MG tablet   Essential hypertension (Chronic)    Blood pressure looks pretty high today, but he was quite stressed out. By the time I rechecked it it was already better control. For now based on his history of orthostatic hypotension reluctant to further titrate. We'll continue with current dose of metoprolol and lisinopril. He is taking metoprolol succinate 50 mg in the evening and 70 5 in the morning.      Relevant Medications   rosuvastatin (CRESTOR) 10 MG tablet   Heart palpitations (Chronic)    Well-controlled now with 75 AM /50 PM of Toprol       Relevant Orders   EKG 12-Lead   Preoperative cardiovascular examination    PREOPERATIVE CARDIAC RISK ASSESSMENT   Revised Cardiac Risk Index:  High Risk Surgery: no; knee  Defined as Intraperitoneal, intrathoracic or suprainguinal vascular  Active CAD: no; stable CAD 2008-2017 & no active Sx.  CHF: no; no CHF Sx. Preserved EF  Cerebrovascular Disease: no;   Diabetes: no; On Insulin: no  CKD (Cr >~ 2): no;   Total:  0 Estimated Risk of Adverse Outcome: LOW   Estimated Risk of MI, PE, VF/VT (Cardiac Arrest), Complete Heart Block: <1 % (further reduced with standing BB dose)   ACC/AHA Guidelines for "Clearance":  Step 1 - Need for Emergency Surgery: No:   If Yes - go straight to OR with perioperative surveillance  Step 2 - Active Cardiac Conditions (Unstable Angina, Decompensated HF, Significant  Arrhytmias - Complete HB, Mobitz II, Symptomatic VT or SVT, Severe Aortic Stenosis - mean gradient > 40 mmHg, Valve area < 1.0 cm2):   No: -   If Yes - Evaluate & Treat per ACC/AHA Guidelines  Step 3 -  Low Risk Surgery: Yes  If Yes --> proceed to OR  If No --> Step 4  Step 4 - Functional Capacity >= 4 METS without symptoms: Yes  If Yes --> proceed to OR  If No --> Step 5  Step 5 --  Clinical Risk Factors (CRF) - see above - Zero.  No CRFs: Yes  If Yes --> Proceed to OR  Based on this assessment, and his improved symptoms, no further cardiac evaluation is required. He would be a low risk patient for low risk surgery.       Unstable angina (HCC)    No further anginal symptoms. Stable coronary disease by cardiac catheterization 2010-2017 with no lesions found to explain recurrent symptoms.  Based on lack of recurrent symptoms, I don't think an ischemic evaluation needs to be done perioperatively.  He remains on stable dose of beta blocker, Imdur, lisinopril and Crestor. He is on Plavix plus aspirin. I think are okay stopping aspirin.      Relevant Medications   rosuvastatin (CRESTOR) 10 MG tablet   Other Relevant Orders   EKG 12-Lead    Other Visit Diagnoses    Pre-operative cardiovascular examination    -  Primary   Relevant Orders   EKG 12-Lead      Current medicines are reviewed at length with the patient today. (+/- concerns) None The following changes have been made: None  Patient Instructions  No change with current medications   Okay for knee surgery    Your  physician recommends that you schedule a follow-up appointment in 6 months with Azalee Course PA      Your physician wants you to follow-up in 12 MONTH WITH DR San Lohmeyer. You will receive a reminder letter in the mail two months in advance. If you don't receive a letter, please call our office to schedule the follow-up appointment.    If you need a refill on your cardiac medications before your next appointment, please call your pharmacy.    Studies Ordered:   Orders Placed This Encounter  Procedures  . EKG 12-Lead      Bryan Lemma, M.D., M.S. Interventional Cardiologist   Pager # [phone] Phone # [phone] [address] [place], [place] [address]

## 2016-08-16 ENCOUNTER — Encounter: Payer: Self-pay | Admitting: Cardiology

## 2016-08-16 DIAGNOSIS — Z01810 Encounter for preprocedural cardiovascular examination: Secondary | ICD-10-CM | POA: Insufficient documentation

## 2016-08-16 NOTE — Assessment & Plan Note (Signed)
Well-controlled now with 75 AM /50 PM of Toprol

## 2016-08-16 NOTE — Assessment & Plan Note (Signed)
Distant CABG followed by a bare-metal stent placement to the ungrafted PDA in 2008. Stent was widely patent in 2010 and 2017. Widely patent grafts. No real change from 2010-2017. No active anginal symptoms. Plan would be to discontinue aspirin and continue Plavix. Continue beta blocker, statin and ACE inhibitor at current doses.

## 2016-08-16 NOTE — Assessment & Plan Note (Signed)
Fairly well-controlled last February. Remains on Crestor 3 days a week. Followed by PCP.

## 2016-08-16 NOTE — Assessment & Plan Note (Signed)
PREOPERATIVE CARDIAC RISK ASSESSMENT   Revised Cardiac Risk Index:  High Risk Surgery: no; knee  Defined as Intraperitoneal, intrathoracic or suprainguinal vascular  Active CAD: no; stable CAD 2008-2017 & no active Sx.  CHF: no; no CHF Sx. Preserved EF  Cerebrovascular Disease: no;   Diabetes: no; On Insulin: no  CKD (Cr >~ 2): no;   Total: 0 Estimated Risk of Adverse Outcome: LOW   Estimated Risk of MI, PE, VF/VT (Cardiac Arrest), Complete Heart Block: <1 % (further reduced with standing BB dose)   ACC/AHA Guidelines for "Clearance":  Step 1 - Need for Emergency Surgery: No:   If Yes - go straight to OR with perioperative surveillance  Step 2 - Active Cardiac Conditions (Unstable Angina, Decompensated HF, Significant  Arrhytmias - Complete HB, Mobitz II, Symptomatic VT or SVT, Severe Aortic Stenosis - mean gradient > 40 mmHg, Valve area < 1.0 cm2):   No: -   If Yes - Evaluate & Treat per ACC/AHA Guidelines  Step 3 -  Low Risk Surgery: Yes  If Yes --> proceed to OR  If No --> Step 4  Step 4 - Functional Capacity >= 4 METS without symptoms: Yes  If Yes --> proceed to OR  If No --> Step 5  Step 5 --  Clinical Risk Factors (CRF) - see above - Zero.  No CRFs: Yes  If Yes --> Proceed to OR  Based on this assessment, and his improved symptoms, no further cardiac evaluation is required. He would be a low risk patient for low risk surgery.

## 2016-08-16 NOTE — Assessment & Plan Note (Signed)
Blood pressure looks pretty high today, but he was quite stressed out. By the time I rechecked it it was already better control. For now based on his history of orthostatic hypotension reluctant to further titrate. We'll continue with current dose of metoprolol and lisinopril. He is taking metoprolol succinate 50 mg in the evening and 70 5 in the morning.

## 2016-08-16 NOTE — Assessment & Plan Note (Addendum)
No further anginal symptoms. Stable coronary disease by cardiac catheterization 2010-2017 with no lesions found to explain recurrent symptoms.  Based on lack of recurrent symptoms, I don't think an ischemic evaluation needs to be done perioperatively.  He remains on stable dose of beta blocker, Imdur, lisinopril and Crestor. He is on Plavix plus aspirin. I think are okay stopping aspirin.

## 2016-11-09 ENCOUNTER — Encounter: Payer: Self-pay | Admitting: Allergy and Immunology

## 2016-11-09 ENCOUNTER — Ambulatory Visit (INDEPENDENT_AMBULATORY_CARE_PROVIDER_SITE_OTHER): Payer: Medicare PPO | Admitting: Allergy and Immunology

## 2016-11-09 VITALS — BP 138/64 | HR 68 | Resp 16

## 2016-11-09 DIAGNOSIS — J3089 Other allergic rhinitis: Secondary | ICD-10-CM

## 2016-11-09 DIAGNOSIS — H9313 Tinnitus, bilateral: Secondary | ICD-10-CM | POA: Diagnosis not present

## 2016-11-09 NOTE — Progress Notes (Signed)
Follow-up Note  Referring Provider: Geoffry Paradise, MD Primary Provider: Geoffry Paradise, MD Date of Office Visit: 11/09/2016  Subjective:   Daniel [name] (DOB: 1932-07-10) is a 83 y.o. male who [facility] in re-evaluation of the following:  HPI: Daniel [name] returns to this clinic in reevaluation of his allergic rhinoconjunctivitis and history of chronic sinusitis. I last saw him in this clinic approximately 6 months ago.  During the interval he has really done well with his respiratory tract and has not required administration of an antibiotic for systemic steroid. He still has some drainage but overall feels as though his nose is doing well and his throat is doing well while consistently using a combination of a nasal steroid and montelukast and nasal azelastine.  He has a history of tendinitis that dates back awhile but over the course the past 2 months it is become quite significant. He has no associated vertigo and no associated hearing loss. He has seen Dr. Jenne Pane in the past about a different issue.  Allergies as of 11/09/2016      Reactions   Penicillins    Swelling and redness in joints   Statins    At high dose   Sulfa Antibiotics    Pain in left side underneath ribcage-pancreas      Medication List      azelastine 0.1 % nasal spray Commonly known as:  ASTELIN instill 2 sprays into each nostril twice a day   cholecalciferol 1000 units tablet Commonly known as:  VITAMIN D Take 1,000 Units by mouth daily.   clopidogrel 75 MG tablet Commonly known as:  PLAVIX Take 75 mg by mouth daily.   DULoxetine 20 MG capsule Commonly known as:  CYMBALTA Take 20 mg by mouth daily.   isosorbide mononitrate 30 MG 24 hr tablet Commonly known as:  IMDUR Take 30 mg by mouth daily.   metoprolol succinate 50 MG 24 hr tablet Commonly known as:  TOPROL-XL Take 50-75 mg by mouth 2 (two) times daily. 75mg  in the morning, 50mg  in the  evening   montelukast 10 MG tablet Commonly known as:  SINGULAIR Take 10 mg by mouth at bedtime.   NITROSTAT 0.4 MG SL tablet Generic drug:  nitroGLYCERIN place 1 tablet under the tongue every 5 minutes if needed for chest pain   RHINOCORT ALLERGY 32 MCG/ACT nasal spray Generic drug:  budesonide Place 1 spray into both nostrils daily.   rosuvastatin 10 MG tablet Commonly known as:  CRESTOR Take 10 mg by mouth 3 (three) times a week.   tamsulosin 0.4 MG Caps capsule Commonly known as:  FLOMAX Take 1 capsule by mouth at bedtime.   traMADol 50 MG tablet Commonly known as:  ULTRAM Take 100-200 mg by mouth daily as needed for moderate pain. Take 2-4 tablets daily   vitamin B-12 1000 MCG tablet Commonly known as:  CYANOCOBALAMIN Take 1,000 mcg by mouth daily.       Past Medical History:  Diagnosis Date  . CAD of autologous arterial graft 2010   Atretic LIMA; Myoview Jan 2015: Possible mild basal anteroseptal defect thought to be artifact (CRO early LAD ischemia due to inadeuate retrograde perfusion via SVG-Diag  . CAD S/P percutaneous coronary angioplasty June 2008;    a. 6/'08: PCI nongrafted distal RCA-PDA: Mini vision BMS 2.25 mm x 28 mm; b. Relook Cath 08/2015: Stable CAD. No culprit lesion. Widely patent PDA stent. Patent SVG-OM 2, SVG-D2. Essentially  atretic/small LIMA-LAD, but the LAD is perfused via SVG-D2  . Coronary artery disease involving left main coronary artery 1997-2010   Last cath 2010: 70-80 % ostial left main, 80-90% LAD, subtotal occluded circumflex, RCA now a patent stent; SVG-diagonal backfills LAD, atretic LIMA-LAD -- medical therapy; stable by relook cath in March 2017  . Dyslipidemia, goal LDL below 70   . Heart palpitations    Never fully diagnosed.  . Hypertension   . Insomnia   . S/P CABG x 3 1997   LIMA-LAD, SVG-D2, SVG-OM --> LIMA known to be atretic, but SVG-D2 fills diagonal and LAD    Past Surgical History:  Procedure Laterality Date  .  CARDIAC CATHETERIZATION  June 2008   showed high grade distal RCA and PDA stenosis and underwent PCI with a 2.25 mm x 28 mm Mini Vision bare- metal stent.  [name] CARDIAC CATHETERIZATION  2010   Native coronaries at 70% to 80% ostial left main. LAD had diffuse 80% to 90% stenosis  . CARDIAC CATHETERIZATION N/A 08/08/2015   Procedure: Left Heart Cath and Cors/Grafts Angiography;  Surgeon: Marykay Lex, MD;  Location: [facility] INVASIVE CV LAB;  Service: Cardiovascular;  Laterality: N/A;  . CORONARY ARTERY BYPASS GRAFT  [id number]   LIMA - LAD, SVG- diagonal, SVG-OM  . NM MYOVIEW LTD  02/25/2004; Jan 2015   a) EF 57%; b)  Normal Nuclear Stress Test - No evidence of Ischemia or Infarction. Normal LV Function & wall motion.    Review of systems negative except as noted in HPI / PMHx or noted below:  Review of Systems  Constitutional: Negative.   HENT: Negative.   Eyes: Negative.   Respiratory: Negative.   Cardiovascular: Negative.   Gastrointestinal: Negative.   Genitourinary: Negative.   Musculoskeletal: Negative.   Skin: Negative.   Neurological: Negative.   Endo/Heme/Allergies: Negative.   Psychiatric/Behavioral: Negative.      Objective:   Vitals:   11/09/16 1453  BP: 138/64  Pulse: 68  Resp: 16          Physical Exam  Constitutional: He is well-developed, well-nourished, and in no distress.  HENT:  Head: Normocephalic.  Right Ear: Tympanic membrane, external ear and ear canal normal.  Left Ear: Tympanic membrane, external ear and ear canal normal.  Nose: Nose normal. No mucosal edema or rhinorrhea.  Mouth/Throat: Uvula is midline, oropharynx is clear and moist and mucous membranes are normal. No oropharyngeal exudate.  Eyes: Conjunctivae are normal.  Neck: Trachea normal. No tracheal tenderness present. No tracheal deviation present. No thyromegaly present.  Cardiovascular: Normal rate, regular rhythm, S1 normal, S2 normal and normal heart sounds.   No murmur  heard. Pulmonary/Chest: Breath sounds normal. No stridor. No respiratory distress. He has no wheezes. He has no rales.  Musculoskeletal: He exhibits no edema.  Lymphadenopathy:       Head (right side): No tonsillar adenopathy present.       Head (left side): No tonsillar adenopathy present.    He has no cervical adenopathy.  Neurological: He is alert. Gait normal.  Skin: No rash noted. He is not diaphoretic. No erythema. Nails show no clubbing.  Psychiatric: Mood and affect normal.    Diagnostics: none  Assessment and Plan:   1. Other allergic rhinitis   2. Tinnitus of both ears     1. Continue OTC Rhinocort one spray each nostril once a day.  2. Continue Montelukast 10 mg one tablet once a day  3. Continue nasal Azelastine 2  sprays each nostril 1-2 times per day  4. Can continue nasal saline washes  5. Revisit with Dr. Jenne PaneBates about tinnitus  6. Return to clinic in 12 months or earlier if problem  Daniel AshingJim appears to be doing relatively well regarding his airway issue on his current plan and I see no need for changing this plan at this point. I've asked him to revisit with Dr. Jenne PaneBates about his tinnitus that has progressed significantly over the course of the past 2 months. I will see him back in this clinic in 12 weeks or earlier if there is a problem.  Laurette SchimkeEric Kozlow, MD Allergy / Immunology [facility]

## 2016-11-09 NOTE — Patient Instructions (Signed)
  1. Continue OTC Rhinocort one spray each nostril once a day.  2. Continue Montelukast 10 mg one tablet once a day  3. Continue nasal Azelastine 2 sprays each nostril 1-2 times per day  4. Can continue nasal saline washes  5. Revisit with Dr. Jenne PaneBates about tinnitus  6. Return to clinic in 12 months or earlier if problem

## 2017-02-04 ENCOUNTER — Other Ambulatory Visit: Payer: Self-pay | Admitting: Cardiology

## 2017-02-15 ENCOUNTER — Ambulatory Visit (INDEPENDENT_AMBULATORY_CARE_PROVIDER_SITE_OTHER): Payer: Medicare PPO | Admitting: Allergy and Immunology

## 2017-02-15 ENCOUNTER — Encounter: Payer: Self-pay | Admitting: Allergy and Immunology

## 2017-02-15 VITALS — BP 130/80 | HR 85 | Temp 98.4°F | Resp 17

## 2017-02-15 DIAGNOSIS — J0100 Acute maxillary sinusitis, unspecified: Secondary | ICD-10-CM

## 2017-02-15 DIAGNOSIS — J3089 Other allergic rhinitis: Secondary | ICD-10-CM

## 2017-02-15 MED ORDER — PREDNISONE 1 MG PO TABS: 10.0000 mg | ORAL_TABLET | Freq: Every day | ORAL | Status: AC

## 2017-02-15 MED ORDER — AZITHROMYCIN 250 MG PO TABS: ORAL_TABLET | ORAL | 0 refills | Status: DC

## 2017-02-15 NOTE — Progress Notes (Signed)
Follow-up Note  RE: Daniel [name] MRN: [id number] DOB: 08/14/1932 Date of Office Visit: 02/15/2017  Primary care provider: Geoffry ParadiseAronson, Richard, MD Referring provider: Geoffry ParadiseAronson, Richard, MD  History of present illness: Daniel ParmaJames M [name] is a 83 y.o. male with allergic rhinoconjunctivitis and history of chronic sinusitis presenting today for sick visit.  He was last seen in this clinic in June 2018 by Dr. Lucie LeatherKozlow.  He complains of sinus pressure behind the eyes and over the cheekbones, thick postnasal drainage, and nasal congestion.  The symptoms have been progressing over the past 3 or 4 years.  He has been expectorating discolored "greenish yellow" mucus and feels weak, however does not believe that he has been feverish.  He is currently taking azelastine nasal spray and Rhinocort AQ.  He states that his sinus infections typically respond to azithromycin.  He is penicillin allergic and states that another antibiotic "tore up" his stomach.   Assessment and plan: Acute sinusitis  Prednisone has been provided, 20 mg x 4 days, 10 mg x1 day, then stop.  A prescription has been provided for azithromycin, 500 mg on day 1 and 250 mg on days 2 through 5.  A sample has been provided for [facility], 2 actuations per nostril twice a day. Proper technique has been discussed and demonstrated.  Nasal saline lavage (NeilMed) has been recommended as needed and prior to medicated nasal sprays.  May continue azelastine nasal spray as needed.  For thick post nasal drainage, add guaifenesin 600 mg (Mucinex)  twice daily as needed with adequate hydration as discussed.  Allergic rhinitis  Continue appropriate allergen avoidance measures.  Treatment plan as outlined above for acute sinusitis.   Meds ordered this encounter  Medications  . azithromycin (ZITHROMAX) 250 MG tablet    Sig: Take 500 mg (two tablets) on day one and 250 mg (1 tablet) on days 2-5    Dispense:  6 each    Refill:  0  . predniSONE  (DELTASONE) tablet 10 mg    Physical examination: Blood pressure 130/80, pulse 85, temperature 98.4 F (36.9 C), resp. rate 17, SpO2 94 %.  General: Alert, interactive, in no acute distress. HEENT: TMs pearly gray, turbinates edematous with thick discharge, post-pharynx moderately erythematous. Neck: Supple without lymphadenopathy. Lungs: Clear to auscultation without wheezing, rhonchi or rales. CV: Normal S1, S2 without murmurs. Skin: Warm and dry, without lesions or rashes.  The following portions of the patient's history were reviewed and updated as appropriate: allergies, current medications, past family history, past medical history, past social history, past surgical history and problem list.  Allergies as of 02/15/2017      Reactions   Penicillins    Swelling and redness in joints   Statins    At high dose   Sulfa Antibiotics    Pain in left side underneath ribcage-pancreas      Medication List       Accurate as of 02/15/17  6:16 PM. Always use your most recent med list.          azelastine 0.1 % nasal spray Commonly known as:  ASTELIN instill 2 sprays into each nostril twice a day   azithromycin 250 MG tablet Commonly known as:  ZITHROMAX Take 500 mg (two tablets) on day one and 250 mg (1 tablet) on days 2-5   cholecalciferol 1000 units tablet Commonly known as:  VITAMIN D Take 1,000 Units by mouth daily.   clopidogrel 75 MG tablet Commonly known as:  PLAVIX Take  75 mg by mouth daily.   DULoxetine 20 MG capsule Commonly known as:  CYMBALTA Take 20 mg by mouth daily.   isosorbide mononitrate 30 MG 24 hr tablet Commonly known as:  IMDUR Take 30 mg by mouth daily.   lisinopril 10 MG tablet Commonly known as:  PRINIVIL,ZESTRIL   metoprolol succinate 50 MG 24 hr tablet Commonly known as:  TOPROL-XL Take 50-75 mg by mouth 2 (two) times daily.  in the morning,  in the evening   montelukast 10 MG tablet Commonly known as:  SINGULAIR Take 10 mg  by mouth at bedtime.   NITROSTAT 0.4 MG SL tablet Generic drug:  nitroGLYCERIN place 1 tablet under the tongue every 5 minutes if needed for chest pain   RHINOCORT ALLERGY 32 MCG/ACT nasal spray Generic drug:  budesonide Place 1 spray into both nostrils daily.   rosuvastatin 10 MG tablet Commonly known as:  CRESTOR Take 10 mg by mouth 3 (three) times a week.   tamsulosin 0.4 MG Caps capsule Commonly known as:  FLOMAX Take 1 capsule by mouth at bedtime.   traMADol 50 MG tablet Commonly known as:  ULTRAM Take 100-200 mg by mouth daily as needed for moderate pain. Take 2-4 tablets daily   vitamin B-12 1000 MCG tablet Commonly known as:  CYANOCOBALAMIN Take 1,000 mcg by mouth daily.   zolpidem 10 MG tablet Commonly known as:  AMBIEN take 1 tablet by mouth at bedtime if needed for sleep            Discharge Care Instructions        Start     Ordered   02/16/17 0800  PREDNISONE 1 MG PO TABS  Daily with breakfast     02/15/17 1816   02/15/17 0000  azithromycin (ZITHROMAX) 250 MG tablet     02/15/17 1816      Allergies  Allergen Reactions  . Penicillins     Swelling and redness in joints  . Statins     At high dose  . Sulfa Antibiotics     Pain in left side underneath ribcage-pancreas   Review of systems: Review of systems negative except as noted in HPI / PMHx or noted below: Constitutional: Negative.  HENT: Negative.   Eyes: Negative.  Respiratory: Negative.   Cardiovascular: Negative.  Gastrointestinal: Negative.  Genitourinary: Negative.  Musculoskeletal: Negative.  Neurological: Negative.  Endo/Heme/Allergies: Negative.  Cutaneous: Negative.  Past Medical History:  Diagnosis Date  . CAD of autologous arterial graft 2010   Atretic LIMA; Myoview Jan 2015: Possible mild basal anteroseptal defect thought to be artifact (CRO early LAD ischemia due to inadeuate retrograde perfusion via SVG-Diag  . CAD S/P percutaneous coronary angioplasty June 2008;      a. 6/'08: PCI nongrafted distal RCA-PDA: Mini vision BMS 2.25 mm x 28 mm; b. Relook Cath 08/2015: Stable CAD. No culprit lesion. Widely patent PDA stent. Patent SVG-OM 2, SVG-D2. Essentially atretic/small LIMA-LAD, but the LAD is perfused via SVG-D2  . Coronary artery disease involving left main coronary artery 1997-2010   Last cath 2010: 70-80 % ostial left main, 80-90% LAD, subtotal occluded circumflex, RCA now a patent stent; SVG-diagonal backfills LAD, atretic LIMA-LAD -- medical therapy; stable by relook cath in March 2017  . Dyslipidemia, goal LDL below 70   . Heart palpitations    Never fully diagnosed.  . Hypertension   . Insomnia   . S/P CABG x 3 1997   LIMA-LAD, SVG-D2, SVG-OM --> LIMA known to be  atretic, but SVG-D2 fills diagonal and LAD    History reviewed. No pertinent family history.  Social History   Social History  . Marital status: Married    Spouse name: N/A  . Number of children: N/A  . Years of education: N/A   Occupational History  . Not on file.   Social History Main Topics  . Smoking status: Never Smoker  . Smokeless tobacco: Never Used  . Alcohol use No  . Drug use: No  . Sexual activity: Not on file   Other Topics Concern  . Not on file   Social History Narrative   He is a married father of 2, grandfather 5. He is retired from [employer]. He travels back and forth between here and [place], [place] where part of his family lives.  He is usually very active, but is limited as far as standing exercise by his knee osteoarthritis.   He does not drink and does not smoke. Never smoked.    I appreciate the opportunity to take part in Dimas's care. Please do not hesitate to contact me with questions.  Sincerely,   R. Jorene Guest, MD

## 2017-02-15 NOTE — Assessment & Plan Note (Signed)
   Continue appropriate allergen avoidance measures.  Treatment plan as outlined above for acute sinusitis. 

## 2017-02-15 NOTE — Assessment & Plan Note (Signed)
   Prednisone has been provided, 20 mg x 4 days, 10 mg x1 day, then stop.  A prescription has been provided for azithromycin, 500 mg on day 1 and 250 mg on days 2 through 5.  A sample has been provided [facility], 2 actuations per nostril twice a day. Proper technique has been discussed and demonstrated.  Nasal saline lavage (NeilMed) has been recommended as needed and prior to medicated nasal sprays.  May continue azelastine nasal spray as needed.  For thick post nasal drainage, add guaifenesin 600 mg (Mucinex)  twice daily as needed with adequate hydration as discussed.

## 2017-02-15 NOTE — Patient Instructions (Addendum)
Acute sinusitis  Prednisone has been provided, 20 mg x 4 days, 10 mg x1 day, then stop.  A prescription has been provided for azithromycin, 500 mg on day 1 and 250 mg on days 2 through 5.  [facility], 2 actuations per nostril twice a day. Proper technique has been discussed and demonstrated.  Nasal saline lavage (NeilMed) has been recommended as needed and prior to medicated nasal sprays.  May continue azelastine nasal spray as needed.  For thick post nasal drainage, add guaifenesin 600 mg (Mucinex)  twice daily as needed with adequate hydration as discussed.  Allergic rhinitis  Continue appropriate allergen avoidance measures.  Treatment plan as outlined above for acute sinusitis.   Return in about 6 months (around 08/15/2017), or if symptoms worsen or fail to improve.

## 2017-03-07 HISTORY — PX: OTHER SURGICAL HISTORY: SHX169

## 2017-03-10 ENCOUNTER — Ambulatory Visit (INDEPENDENT_AMBULATORY_CARE_PROVIDER_SITE_OTHER): Payer: Medicare PPO | Admitting: Cardiology

## 2017-03-10 ENCOUNTER — Encounter: Payer: Self-pay | Admitting: Cardiology

## 2017-03-10 VITALS — BP 144/76 | HR 62 | Ht 75.0 in | Wt 199.0 lb

## 2017-03-10 DIAGNOSIS — I2510 Atherosclerotic heart disease of native coronary artery without angina pectoris: Secondary | ICD-10-CM | POA: Diagnosis not present

## 2017-03-10 DIAGNOSIS — Z951 Presence of aortocoronary bypass graft: Secondary | ICD-10-CM

## 2017-03-10 DIAGNOSIS — R002 Palpitations: Secondary | ICD-10-CM

## 2017-03-10 DIAGNOSIS — I951 Orthostatic hypotension: Secondary | ICD-10-CM

## 2017-03-10 DIAGNOSIS — R5383 Other fatigue: Secondary | ICD-10-CM | POA: Diagnosis not present

## 2017-03-10 DIAGNOSIS — R0609 Other forms of dyspnea: Secondary | ICD-10-CM | POA: Diagnosis not present

## 2017-03-10 DIAGNOSIS — I10 Essential (primary) hypertension: Secondary | ICD-10-CM | POA: Diagnosis not present

## 2017-03-10 DIAGNOSIS — E785 Hyperlipidemia, unspecified: Secondary | ICD-10-CM

## 2017-03-10 DIAGNOSIS — Z9861 Coronary angioplasty status: Secondary | ICD-10-CM

## 2017-03-10 DIAGNOSIS — Z01810 Encounter for preprocedural cardiovascular examination: Secondary | ICD-10-CM

## 2017-03-10 NOTE — Progress Notes (Signed)
PCP: Daniel Paradise, MD  Clinic Note: Chief Complaint  Patient presents with  . Follow-up    Poor Energy, DOE  . Coronary Artery Disease    HPI: Daniel [name] is a 83 y.o. male with a PMH below who presents today for six-month follow-up of his coronary disease status post CABG. He is a former patient Dr. Julieanne [name] with a distant history of CAD-CABG dating back to 1997 in for LM CAD -- evaluation was for exertional angina. PTCA of rPDA for what sounded like acute CHF Sx - dyspnea, no angina.  He has a known atretic LIMA but the LAD fills via the SVG-D2. He had mild chronic angina in the past, but was evaluated by cardiac catheterization showing stable disease in 2017.  Daniel [name] was last seen in March 2018 for annual follow-up and preoperative evaluation for knee surgery.  He denied any active cardiac symptoms at that time. Occasional skipped beats but nothing significant for a rhythm standpoint. He was really unable to reduce Toprol to 50 twice a day and went back to 75 the morning. - Plan was for knee surgery this spring -- did not happened b/c his wife had to have hip Sgx.  Recent Hospitalizations: None  Studies Reviewed: None since cardiac cath since March 2017  Interval History: Daniel [name] presents today Not quite feeling up to his normal self. He notes that he has been having more pronounced fatigue and lack of energy and at baseline. He is having some exertional dyspnea. About the last month he's been noticing progressively worse fatigue and weakness. He's been evaluated by his PCP with blood work and was told that everything looked okay. He has not noted any real chest discomfort with exertion, but has noted exertional dyspnea with some tightness making it difficult for him to breathe with exertion. He is also noted a little unsteadiness in his gait. He's had a fall in the weekend going to the bathroom. He basically felt like he lost his balance. He denies any rapid, irregular  heartbeats or palpitations. He just felt unsteady and was able to control his fall by holding onto the wall. But then he fell backwards in the in his head. He has been noticing some more than usual "skipped beats" that he has increased his Toprol to 75 twice a day and not necessarily noted any benefit.  He has not had any PND, orthopnea or edema. Although he had unsteady gait and fall, he is not had any syncope or near syncope. He's had some palpitations, but nothing that he would feel as being rapid or irregular to suggest arrhythmia. Apparently he is been started on new nasal inhaler and that seemed to be when he started noticing some of these symptoms and he wasn't sure if it could be potentially a side effect of it.  Other Cardiovascular ROS: positive for - dyspnea on exertion, irregular heartbeat and fatigue negative for - edema, loss of consciousness, murmur, orthopnea, paroxysmal nocturnal dyspnea, rapid heart rate or TIA/Amaurosis Fugax, claudication.   ROS: A comprehensive was performed. Review of Systems  Constitutional: Positive for malaise/fatigue.  HENT: Negative for congestion and nosebleeds.   Respiratory: Negative for cough and shortness of breath (with exertion only).   Cardiovascular:       Per HPI  Gastrointestinal: Negative for abdominal pain, blood in stool, heartburn and melena.  Genitourinary: Negative for hematuria.  Musculoskeletal: Positive for joint pain (Bilateral knee pain). Negative for back pain.  Neurological: Positive for  dizziness (Poor balance).  Psychiatric/Behavioral: Negative.  Negative for memory loss. The patient is not nervous/anxious.   All other systems reviewed and are negative.   Past Medical History:  Diagnosis Date  . CAD of autologous arterial graft 2010   Atretic LIMA; Myoview Jan 2015: Possible mild basal anteroseptal defect thought to be artifact (CRO early LAD ischemia due to inadeuate retrograde perfusion via SVG-Diag  . CAD S/P  percutaneous coronary angioplasty June 2008;    a. 6/'08: PCI nongrafted distal RCA-PDA: Mini vision BMS 2.25 mm x 28 mm; b. Relook Cath 08/2015: Stable CAD. No culprit lesion. Widely patent PDA stent. Patent SVG-OM 2, SVG-D2. Essentially atretic/small LIMA-LAD, but the LAD is perfused via SVG-D2  . Coronary artery disease involving left main coronary artery 1997-2010   Last cath 2010: 70-80 % ostial left main, 80-90% LAD, subtotal occluded circumflex, RCA now a patent stent; SVG-diagonal backfills LAD, atretic LIMA-LAD -- medical therapy; stable by relook cath in March 2017  . Dyslipidemia, goal LDL below 70   . Heart palpitations    Never fully diagnosed.  . Hypertension   . Insomnia   . S/P CABG x 3 1997   LIMA-LAD, SVG-D2, SVG-OM --> LIMA known to be atretic, but SVG-D2 fills diagonal and LAD    Past Surgical History:  Procedure Laterality Date  . CARDIAC CATHETERIZATION  June 2008   showed high grade distal RCA and PDA stenosis and underwent PCI with a 2.25 mm x 28 mm Mini Vision bare- metal stent.  [name] CARDIAC CATHETERIZATION  2010   Native coronaries at 70% to 80% ostial left main. LAD had diffuse 80% to 90% stenosis  . CARDIAC CATHETERIZATION N/A 08/08/2015   Procedure: Left Heart Cath and Cors/Grafts Angiography;  Surgeon: Daniel Lex, MD;  Location: [facility] INVASIVE CV LAB;  Service: Cardiovascular;  Laterality: N/A;  . CORONARY ARTERY BYPASS GRAFT  [id number]   LIMA - LAD, SVG- diagonal, SVG-OM  . NM MYOVIEW LTD  02/25/2004; Jan 2015   a) EF 57%; b)  Normal Nuclear Stress Test - No evidence of Ischemia or Infarction. Normal LV Function & wall motion.   Diagnostic Diagram - Cath 08/2015: Stable disease from 2010. No culprit lesion for chest pain. Ischemia potentially related to antegrade flow in OM 1 or D1 based on LM and LAD stenosis.     Current Meds  Medication Sig  . azelastine (ASTELIN) 0.1 % nasal spray instill 2 sprays into each nostril twice a day  . cholecalciferol (VITAMIN  D) 1000 units tablet Take 1,000 Units by mouth daily.  . clopidogrel (PLAVIX) 75 MG tablet Take 75 mg by mouth daily.  . DULoxetine (CYMBALTA) 20 MG capsule Take 20 mg by mouth daily.  . isosorbide mononitrate (IMDUR) 30 MG 24 hr tablet Take 30 mg by mouth daily.  [name] lisinopril (PRINIVIL,ZESTRIL) 10 MG tablet Take 10 mg by mouth daily.   . metoprolol succinate (TOPROL-XL) 50 MG 24 hr tablet Take 50-75 mg by mouth 2 (two) times daily. 75mg  in the morning, 50mg  in the evening  . montelukast (SINGULAIR) 10 MG tablet Take 10 mg by mouth at bedtime.  [name] NITROSTAT 0.4 MG SL tablet place 1 tablet under the tongue every 5 minutes if needed for chest pain  . rosuvastatin (CRESTOR) 10 MG tablet Take 10 mg by mouth 3 (three) times a week.  . tamsulosin (FLOMAX) 0.4 MG CAPS capsule Take 1 capsule by mouth at bedtime.   . traMADol (ULTRAM) 50 MG tablet Take  100-200 mg by mouth daily as needed for moderate pain. Take 2-4 tablets daily  . vitamin B-12 (CYANOCOBALAMIN) 1000 MCG tablet Take 1,000 mcg by mouth daily.  [name] zolpidem (AMBIEN) 10 MG tablet take 1 tablet by mouth at bedtime if needed for sleep    Allergies  Allergen Reactions  . Penicillins     Swelling and redness in joints  . Statins     At high dose  . Sulfa Antibiotics     Pain in left side underneath ribcage-pancreas    Social History   Social History  . Marital status: Married    Spouse name: N/A  . Number of children: N/A  . Years of education: N/A   Social History Main Topics  . Smoking status: Never Smoker  . Smokeless tobacco: Never Used  . Alcohol use No  . Drug use: No  . Sexual activity: Not Asked   Other Topics Concern  . None   Social History Narrative   He is a married father of 2, grandfather 5. He is retired from [employer]. He travels back and forth between here and [place], [place] where part of his family lives.  He is usually very active, but is limited as far as standing exercise by his knee osteoarthritis.    He does not drink and does not smoke. Never smoked.    family history is not on file.  Wt Readings from Last 3 Encounters:  03/10/17 199 lb (90.3 kg)  08/13/16 200 lb (90.7 kg)  10/15/15 200 lb 6.4 oz (90.9 kg)    PHYSICAL EXAM BP (!) 144/76   Pulse 62   Ht  (1.905 m)   Wt 199 lb (90.3 kg)   BMI 24.87 kg/m  - BP recheck 140/74 (was upset about being in traffic) - BP @ home usually in 130-140 mmHg range. Physical Exam  Constitutional: He is oriented to person, place, and time. He appears well-developed and well-nourished. No distress.  Well-groomed, healthy-appearing  HENT:  Head: Normocephalic and atraumatic.  Neck: Normal range of motion. Neck supple. No hepatojugular reflux and no JVD present. Carotid bruit is not present.  Cardiovascular: Normal rate, regular rhythm, intact distal pulses and normal pulses.   No extrasystoles are present. PMI is not displaced.  Exam reveals no gallop and no distant heart sounds.   No murmur heard. Pulmonary/Chest: Effort normal and breath sounds normal. No respiratory distress. He has no wheezes. He has no rales.  Abdominal: Soft. Bowel sounds are normal. He exhibits no distension. There is no tenderness. There is no rebound.  Musculoskeletal: Normal range of motion. He exhibits no edema.  Neurological: He is alert and oriented to person, place, and time.  Skin: Skin is warm and dry. No rash noted. No erythema.  Psychiatric: He has a normal mood and affect. His behavior is normal. Judgment and thought content normal.  Nursing note and vitals reviewed.   Adult ECG Report  Rate: 61 ;  Rhythm: normal sinus rhythm and Nonspecific ST and T-wave changes. Otherwise normal axis, intervals and durations.  Narrative Interpretation: Stable EKG.   Other studies Reviewed: Additional studies/ records that were reviewed today include:  Recent Labs:  Labs checked by PCP -- not available   ASSESSMENT / PLAN: Problem List Items Addressed This Visit     CAD S/P PCI June 2008 - Primary (Chronic)    Distant history of CAD with CABG and then PCI back in 2008 to the ungrafted PDA. Relook catheterization in 20  03/26/2016 showed widely patent grafts and stent. He never really had active anginal symptoms but was mostly noting exertional dyspnea which is what is currently noticing now. He definitely has had downturn his feelings overall. I would therefore like to evaluate for new ischemia.  Plan: Lexiscan Myoview For now continue Plavix without aspirin along with beta blocker and Imdur. His also on statin and ACE inhibitor.      Relevant Orders   MYOCARDIAL PERFUSION IMAGING   DOE (dyspnea on exertion)    This is the main symptom concerning for possible recurrent anginal equivalent. Evaluate with Myoview stress test.      Relevant Orders   MYOCARDIAL PERFUSION IMAGING   HOLTER MONITOR - 48 HOUR   Dyslipidemia, goal LDL below 70 (Chronic)   Essential hypertension (Chronic)    His blood pressure seems okay today. We are hoping to have a little bit of permissive hypertension because of orthostatic hypotension. He is taking his lisinopril twice a day which she probably just reduce to once a day because of his dizziness at night. I can't prove that this is orthostatic, but it sounds consistent with it. For now would just continue current 75 twice a day of metoprolol because of his increased palpitations.      Fatigue (Chronic)    This is been an intermittent issue with him. It seems like his fatigue has predated him increasing his metoprolol dose.  Not sure if this is potentially related to ischemia or not. We're evaluating with [employer].      Heart palpitations (Chronic)    He now is having increases but a beta blocker and noticing more frequent palpitations. What he is describing now seems to be more short little bursts as opposed to just fleeting little episodes. Concerned that he may be having short episodes of A. fib. Plan: 48-hour  monitor.      Relevant Orders   MYOCARDIAL PERFUSION IMAGING   HOLTER MONITOR - 48 HOUR   Orthostatic hypotension (Chronic)    This is an ongoing issue for him. Probably, located by his height. His ACE inhibitor dosing and increase to 10 mg twice a day. I recommended that he just takes it once a day to avoid the nighttime hypotension.      Relevant Orders   MYOCARDIAL PERFUSION IMAGING   HOLTER MONITOR - 48 HOUR   Preoperative cardiovascular examination    He has not yet had his knee surgery, therefore for reevaluation states now that he is having some worsening exertional dyspnea symptoms, I think we need to go ahead and proceed with a stress test.      Relevant Orders   MYOCARDIAL PERFUSION IMAGING   HOLTER MONITOR - 48 HOUR   S/P CABG x 3 (Chronic)   Relevant Orders   MYOCARDIAL PERFUSION IMAGING      Current medicines are reviewed at length with the patient today. (+/- concerns) None The following changes have been made: None  Patient Instructions  NO MEDICATION CHANGES   SCHEDULE AT [address] Your physician has recommended that you wear a holter monitor-48 HOUR. Holter monitors are medical devices that record the heart's electrical activity. Doctors most often use these monitors to diagnose arrhythmias. Arrhythmias are problems with the speed or rhythm of the heartbeat. The monitor is a small, portable device. You can wear one while you do your normal daily activities. This is usually used to diagnose what is causing palpitations/syncope (passing out).  SCHEDULE  [address] Your physician has requested that you have a lexiscan myoview. For further information please visit [email or web address] Please follow instruction sheet, as given.    Your physician recommends that you schedule a follow-up appointment in 3- 4 WEEKS WITH DR HARDING.    Studies Ordered:   Orders Placed This Encounter  Procedures  . MYOCARDIAL  PERFUSION IMAGING  . [facility] MONITOR - 48 HOUR      Bryan Lemma, M.D., M.S. Interventional Cardiologist   Pager # [phone] Phone # [phone] [address] [place], [place] [address]

## 2017-03-10 NOTE — Patient Instructions (Signed)
NO MEDICATION CHANGES   SCHEDULE AT [address] Your physician has recommended that you wear a holter monitor-48 HOUR. Holter monitors are medical devices that record the heart's electrical activity. Doctors most often use these monitors to diagnose arrhythmias. Arrhythmias are problems with the speed or rhythm of the heartbeat. The monitor is a small, portable device. You can wear one while you do your normal daily activities. This is usually used to diagnose what is causing palpitations/syncope (passing out).     SCHEDULE  [address] Your physician has requested that you have a lexiscan myoview. For further information please visit [email or web address] Please follow instruction sheet, as given.    Your physician recommends that you schedule a follow-up appointment in 3- 4 WEEKS WITH DR HARDING.

## 2017-03-12 ENCOUNTER — Encounter: Payer: Self-pay | Admitting: Cardiology

## 2017-03-12 NOTE — Assessment & Plan Note (Signed)
He has not yet had his knee surgery, therefore for reevaluation states now that he is having some worsening exertional dyspnea symptoms, I think we need to go ahead and proceed with a stress test.

## 2017-03-12 NOTE — Assessment & Plan Note (Signed)
This is been an intermittent issue with him. It seems like his fatigue has predated him increasing his metoprolol dose.  Not sure if this is potentially related to ischemia or not. We're evaluating with [employer].

## 2017-03-12 NOTE — Assessment & Plan Note (Signed)
This is an ongoing issue for him. Probably, located by his height. His ACE inhibitor dosing and increase to 10 mg twice a day. I recommended that he just takes it once a day to avoid the nighttime hypotension.

## 2017-03-12 NOTE — Assessment & Plan Note (Signed)
He now is having increases but a beta blocker and noticing more frequent palpitations. What he is describing now seems to be more short little bursts as opposed to just fleeting little episodes. Concerned that he may be having short episodes of A. fib. Plan: 48-hour monitor.

## 2017-03-12 NOTE — Assessment & Plan Note (Signed)
Distant history of CAD with CABG and then PCI back in 2008 to the ungrafted PDA. Relook catheterization in 20 03/26/2016 showed widely patent grafts and stent. He never really had active anginal symptoms but was mostly noting exertional dyspnea which is what is currently noticing now. He definitely has had downturn his feelings overall. I would therefore like to evaluate for new ischemia.  Plan: Lexiscan Myoview For now continue Plavix without aspirin along with beta blocker and Imdur. His also on statin and ACE inhibitor.

## 2017-03-12 NOTE — Assessment & Plan Note (Addendum)
His blood pressure seems okay today. We are hoping to have a little bit of permissive hypertension because of orthostatic hypotension. He is taking his lisinopril twice a day which she probably just reduce to once a day because of his dizziness at night. I can't prove that this is orthostatic, but it sounds consistent with it. For now would just continue current 75 twice a day of metoprolol because of his increased palpitations.

## 2017-03-12 NOTE — Assessment & Plan Note (Signed)
This is the main symptom concerning for possible recurrent anginal equivalent. Evaluate with Myoview stress test.

## 2017-03-15 ENCOUNTER — Telehealth (HOSPITAL_COMMUNITY): Payer: Self-pay

## 2017-03-15 NOTE — Telephone Encounter (Signed)
Encounter complete. 

## 2017-03-17 ENCOUNTER — Ambulatory Visit (HOSPITAL_COMMUNITY)
Admission: RE | Admit: 2017-03-17 | Discharge: 2017-03-17 | Disposition: A | Discharge status: Home / Self Care | Payer: Medicare PPO | Source: Ambulatory Visit | Attending: Cardiovascular Disease | Admitting: Cardiovascular Disease

## 2017-03-17 DIAGNOSIS — Z01810 Encounter for preprocedural cardiovascular examination: Secondary | ICD-10-CM | POA: Diagnosis not present

## 2017-03-17 DIAGNOSIS — R002 Palpitations: Secondary | ICD-10-CM | POA: Diagnosis not present

## 2017-03-17 DIAGNOSIS — I951 Orthostatic hypotension: Secondary | ICD-10-CM | POA: Diagnosis not present

## 2017-03-17 DIAGNOSIS — R0609 Other forms of dyspnea: Secondary | ICD-10-CM | POA: Diagnosis not present

## 2017-03-17 DIAGNOSIS — Z951 Presence of aortocoronary bypass graft: Secondary | ICD-10-CM | POA: Diagnosis not present

## 2017-03-17 DIAGNOSIS — Z9861 Coronary angioplasty status: Secondary | ICD-10-CM

## 2017-03-17 DIAGNOSIS — I2510 Atherosclerotic heart disease of native coronary artery without angina pectoris: Secondary | ICD-10-CM | POA: Insufficient documentation

## 2017-03-17 HISTORY — PX: NM MYOVIEW LTD: HXRAD82

## 2017-03-17 LAB — MYOCARDIAL PERFUSION IMAGING
LV dias vol: 128 mL (ref 62–150)
LV sys vol: 59 mL
Peak HR: 81 {beats}/min
Rest HR: 48 {beats}/min
SDS: 0
SRS: 1
SSS: 1
TID: 1.34

## 2017-03-17 MED ORDER — TECHNETIUM TC 99M TETROFOSMIN IV KIT
30.6000 | PACK | Freq: Once | INTRAVENOUS | Status: AC | PRN
  Administered 2017-03-17: 30.6 via INTRAVENOUS
  Filled 2017-03-17: qty 31

## 2017-03-17 MED ORDER — TECHNETIUM TC 99M TETROFOSMIN IV KIT
10.3000 | PACK | Freq: Once | INTRAVENOUS | Status: AC | PRN
  Administered 2017-03-17: 10.3 via INTRAVENOUS
  Filled 2017-03-17: qty 11

## 2017-03-17 MED ORDER — REGADENOSON 0.4 MG/5ML IV SOLN
0.4000 mg | Freq: Once | INTRAVENOUS | Status: AC
  Administered 2017-03-17: 0.4 mg via INTRAVENOUS

## 2017-03-17 MED ORDER — AMINOPHYLLINE 25 MG/ML IV SOLN
75.0000 mg | Freq: Once | INTRAVENOUS | Status: AC
  Administered 2017-03-17: 75 mg via INTRAVENOUS

## 2017-03-21 ENCOUNTER — Telehealth: Payer: Self-pay | Admitting: Cardiology

## 2017-03-21 NOTE — Telephone Encounter (Signed)
SPOKE WITH PATIENT he states he has fallen at least 4 times since last office visit. Patient has an appointment to discuss doing surgery on knee with DR LUCEY. RN informed patient that stress test was okay .  Dr Herbie Baltimore will probably give him clearance once he completes  48 hour monitor which will be placed 03/23/17. Patient states he will have Dr Tobin Chad office contact Dr Elissa Hefty office. RN informed patient once clearance has been sent will be able to make a decision. verbalized understanding.

## 2017-03-21 NOTE — Telephone Encounter (Signed)
New Message  Pt call requesting to speak with RN to discuss him having a right knee replacement. Pt state he has taken 4 falls in the pass week.  Please call back to discuss.

## 2017-03-23 ENCOUNTER — Ambulatory Visit (INDEPENDENT_AMBULATORY_CARE_PROVIDER_SITE_OTHER): Payer: Medicare PPO

## 2017-03-23 DIAGNOSIS — R0609 Other forms of dyspnea: Secondary | ICD-10-CM | POA: Diagnosis not present

## 2017-03-23 DIAGNOSIS — I951 Orthostatic hypotension: Secondary | ICD-10-CM

## 2017-03-23 DIAGNOSIS — R002 Palpitations: Secondary | ICD-10-CM

## 2017-03-23 DIAGNOSIS — Z01810 Encounter for preprocedural cardiovascular examination: Secondary | ICD-10-CM

## 2017-03-30 ENCOUNTER — Telehealth: Payer: Self-pay | Admitting: Cardiology

## 2017-03-30 NOTE — Telephone Encounter (Signed)
Pt would like his Stress test and Monitor esults please. He said he also had some other issues he needs to discuss with you.

## 2017-03-30 NOTE — Telephone Encounter (Signed)
Spoke with Daniel [name], for 2 weeks now he has had a numbness in his fingers and hands with tingling in the end of his fingers. He now feels it is going up into his arms and it takes more effort to eat, brush his hair and brush teeth. Questions regarding potential stroke discussed and patient denies any slurred speech, drooping of facial features or weakness on one side. He ask his orthopaedic and was told it may be a pinched nerve but he wants to know what dr harding thinks. Aware dr Herbie Baltimoreharding is not in the office and will forward for his review and advise. Aware monitor results not available yet and stress test results discussed.

## 2017-04-01 ENCOUNTER — Telehealth: Payer: Self-pay

## 2017-04-01 NOTE — Telephone Encounter (Signed)
   [facility] Pre-operative Risk Assessment    Request for surgical clearance:  1. What type of surgery is being performed? Pt will be undergoing a major Orthopaedic procedure  2. When is this surgery scheduled? TBD   3. Are there any medications that need to be held prior to surgery and how long? ?-unk    4. Practice name and name of physician performing surgery? [facility] PA-C   5. What is your office phone and fax number? [phone]  Fax [phone]   6. Anesthesia type (None, local, MAC, general) ? unk-? 7. Pt's BMI: HgA1C:   Daniel [name] 04/01/2017, 7:42 AM  _________________________________________________________________   (provider comments below)

## 2017-04-01 NOTE — Telephone Encounter (Signed)
Dr. Herbie BaltimoreHarding. Reviewed recent note, myoview low risk. 24 hour Holter showed PVCs, bigeminy, questionable 1 episode of prolonged first degree AV block on 03/25/2017 8:15:16 AM (no symptom reported). I plan to clear patient for surgery with instruction to hold plavix for 5 days, is that ok with you?  Please forward reponse to preop pool, CV DIV PREOP [[phone]][[id number]]

## 2017-04-04 NOTE — Telephone Encounter (Signed)
I am not sure what these symptoms are - it dose sound more neurologic than cardiac -- recommend d/w PCP &/or Ortho.  Bryan Lemmaavid Harding

## 2017-04-05 NOTE — Telephone Encounter (Signed)
Dr. Herbie BaltimoreHarding please provide guidance regarding Plavix.

## 2017-04-05 NOTE — Telephone Encounter (Signed)
Patient aware of instruction per dr harding.  patient states orthopaedics have not received  Pre op clearance from cardiology.  RN REVIEWED patients chart - see documentation  Will refax information

## 2017-04-05 NOTE — Telephone Encounter (Signed)
Okay to hold Plavix 5-7 days preop.  Enzley Kitchens, MD 

## 2017-04-06 NOTE — Telephone Encounter (Signed)
Pt is aware that he is cleared for orthopaedic procedure with instructions to hold plavix for 5 days prior. Pt also aware and agreeable to monitor results. I will fax clearance to Laurier Nancyolby Robbins, PA-C at [facility] [place]

## 2017-04-06 NOTE — Telephone Encounter (Signed)
   Chart reviewed as part of pre-operative protocol coverage. Given past medical history and time since last visit, based on ACC/AHA guidelines, Louretta ParmaJames M Pitera would be at acceptable risk for the planned procedure without further cardiovascular testing. It is ok to hold plavix for 5 days before as well.   Nada BoozerLaura Kailyn Dubie, NP 04/06/2017, 4:15 PM

## 2017-04-12 ENCOUNTER — Ambulatory Visit (INDEPENDENT_AMBULATORY_CARE_PROVIDER_SITE_OTHER): Payer: Medicare PPO

## 2017-04-12 ENCOUNTER — Ambulatory Visit: Payer: Medicare PPO | Admitting: Allergy and Immunology

## 2017-04-12 ENCOUNTER — Encounter (INDEPENDENT_AMBULATORY_CARE_PROVIDER_SITE_OTHER): Payer: Self-pay | Admitting: Physical Medicine and Rehabilitation

## 2017-04-12 ENCOUNTER — Ambulatory Visit (INDEPENDENT_AMBULATORY_CARE_PROVIDER_SITE_OTHER): Payer: Medicare PPO | Admitting: Physical Medicine and Rehabilitation

## 2017-04-12 ENCOUNTER — Encounter: Payer: Self-pay | Admitting: Allergy and Immunology

## 2017-04-12 VITALS — BP 114/89 | HR 72

## 2017-04-12 VITALS — BP 132/70 | HR 72 | Resp 16

## 2017-04-12 DIAGNOSIS — M47812 Spondylosis without myelopathy or radiculopathy, cervical region: Secondary | ICD-10-CM | POA: Diagnosis not present

## 2017-04-12 DIAGNOSIS — R202 Paresthesia of skin: Secondary | ICD-10-CM

## 2017-04-12 DIAGNOSIS — R519 Headache, unspecified: Secondary | ICD-10-CM

## 2017-04-12 DIAGNOSIS — R51 Headache: Secondary | ICD-10-CM | POA: Diagnosis not present

## 2017-04-12 DIAGNOSIS — J3089 Other allergic rhinitis: Secondary | ICD-10-CM

## 2017-04-12 DIAGNOSIS — M542 Cervicalgia: Secondary | ICD-10-CM | POA: Diagnosis not present

## 2017-04-12 DIAGNOSIS — G609 Hereditary and idiopathic neuropathy, unspecified: Secondary | ICD-10-CM | POA: Diagnosis not present

## 2017-04-12 DIAGNOSIS — M609 Myositis, unspecified: Secondary | ICD-10-CM

## 2017-04-12 DIAGNOSIS — J329 Chronic sinusitis, unspecified: Secondary | ICD-10-CM

## 2017-04-12 MED ORDER — CLINDAMYCIN HCL 150 MG PO CAPS: 150.0000 mg | ORAL_CAPSULE | Freq: Three times a day (TID) | ORAL | 0 refills | Status: AC

## 2017-04-12 MED ORDER — METHYLPREDNISOLONE ACETATE 80 MG/ML IJ SUSP
80.0000 mg | Freq: Once | INTRAMUSCULAR | Status: AC
  Administered 2017-04-12: 80 mg via INTRAMUSCULAR

## 2017-04-12 MED ORDER — GABAPENTIN 300 MG PO CAPS: 300.0000 mg | ORAL_CAPSULE | Freq: Every day | ORAL | 0 refills | Status: DC

## 2017-04-12 NOTE — Progress Notes (Signed)
Follow-up Note  Referring Provider: Geoffry Paradise, MD Primary Provider: Geoffry Paradise, MD Date of Office Visit: 04/12/2017  Subjective:   Daniel [name] (DOB: April 07, 1933) is a 83 y.o. male who [facility] in re-evaluation of the following:  HPI: [name] returns to this clinic in reevaluation of his allergic rhinoconjunctivitis and history of chronic sinusitis and history of chronic cephalgia.  He was last seen in this clinic by me June 2018 but did need to visit with Dr. Nunzio Cobbs on 02/15/2017 for a an episode of sinusitis treated with azithromycin.  Since that last clinic visit he has continued to have very significant problems with headaches.  He has a headache behind his eyes that sometimes leaves him nauseated.  This is occurring on a daily basis.  He is having yellow nasal discharge and has had fullness in general.  He continues to use all of his medications including a nasal steroid and leukotriene modifier.  He did obtain a flu vaccine this year.  Allergies as of 04/12/2017      Reactions   Penicillins    Swelling and redness in joints   Statins    At high dose   Sulfa Antibiotics    Pain in left side underneath ribcage-pancreas      Medication List      azelastine 0.1 % nasal spray Commonly known as:  ASTELIN instill 2 sprays into each nostril twice a day   budesonide 32 MCG/ACT nasal spray Commonly known as:  RHINOCORT AQUA 1 spray daily.   celecoxib 200 MG capsule Commonly known as:  CELEBREX Take 200 mg daily by mouth.   cholecalciferol 1000 units tablet Commonly known as:  VITAMIN D Take 1,000 Units by mouth daily.   clopidogrel 75 MG tablet Commonly known as:  PLAVIX Take 75 mg by mouth daily.   DULoxetine 20 MG capsule Commonly known as:  CYMBALTA Take 20 mg by mouth daily.   gabapentin 300 MG capsule Commonly known as:  NEURONTIN Take 1 capsule (300 mg total) at bedtime by mouth.   isosorbide  mononitrate 30 MG 24 hr tablet Commonly known as:  IMDUR Take 30 mg by mouth daily.   lisinopril 10 MG tablet Commonly known as:  PRINIVIL,ZESTRIL Take 10 mg by mouth daily.   metoprolol succinate 50 MG 24 hr tablet Commonly known as:  TOPROL-XL Take 50-75 mg by mouth 2 (two) times daily. 75mg  in the morning, 50mg  in the evening   montelukast 10 MG tablet Commonly known as:  SINGULAIR Take 10 mg by mouth at bedtime.   NITROSTAT 0.4 MG SL tablet Generic drug:  nitroGLYCERIN place 1 tablet under the tongue every 5 minutes if needed for chest pain   rosuvastatin 10 MG tablet Commonly known as:  CRESTOR Take 10 mg by mouth 3 (three) times a week.   tamsulosin 0.4 MG Caps capsule Commonly known as:  FLOMAX Take 1 capsule by mouth at bedtime.   traMADol 50 MG tablet Commonly known as:  ULTRAM Take 100-200 mg by mouth daily as needed for moderate pain. Take 2-4 tablets daily   vitamin B-12 1000 MCG tablet Commonly known as:  CYANOCOBALAMIN Take 1,000 mcg by mouth daily.   zolpidem 10 MG tablet Commonly known as:  AMBIEN take 1 tablet by mouth at bedtime if needed for sleep       Past Medical History:  Diagnosis Date  . CAD of autologous arterial graft 2010  Atretic LIMA; Myoview Jan 2015: Possible mild basal anteroseptal defect thought to be artifact (CRO early LAD ischemia due to inadeuate retrograde perfusion via SVG-Diag  . CAD S/P percutaneous coronary angioplasty June 2008;    a. 6/'08: PCI nongrafted distal RCA-PDA: Mini vision BMS 2.25 mm x 28 mm; b. Relook Cath 08/2015: Stable CAD. No culprit lesion. Widely patent PDA stent. Patent SVG-OM 2, SVG-D2. Essentially atretic/small LIMA-LAD, but the LAD is perfused via SVG-D2  . Coronary artery disease involving left main coronary artery 1997-2010   Last cath 2010: 70-80 % ostial left main, 80-90% LAD, subtotal occluded circumflex, RCA now a patent stent; SVG-diagonal backfills LAD, atretic LIMA-LAD -- medical therapy;  stable by relook cath in March 2017  . Dyslipidemia, goal LDL below 70   . Heart palpitations    Never fully diagnosed.  . Hypertension   . Insomnia   . S/P CABG x 3 1997   LIMA-LAD, SVG-D2, SVG-OM --> LIMA known to be atretic, but SVG-D2 fills diagonal and LAD    Past Surgical History:  Procedure Laterality Date  . CARDIAC CATHETERIZATION  June 2008   showed high grade distal RCA and PDA stenosis and underwent PCI with a 2.25 mm x 28 mm Mini Vision bare- metal stent.  [name]. CARDIAC CATHETERIZATION  2010   Native coronaries at 70% to 80% ostial left main. LAD had diffuse 80% to 90% stenosis  . CORONARY ARTERY BYPASS GRAFT  [id number]   LIMA - LAD, SVG- diagonal, SVG-OM  . NM MYOVIEW LTD  02/25/2004; Jan 2015   a) EF 57%; b)  Normal Nuclear Stress Test - No evidence of Ischemia or Infarction. Normal LV Function & wall motion.    Review of systems negative except as noted in HPI / PMHx or noted below:  Review of Systems  Constitutional: Negative.   HENT: Negative.   Eyes: Negative.   Respiratory: Negative.   Cardiovascular: Negative.   Gastrointestinal: Negative.   Genitourinary: Negative.   Musculoskeletal: Negative.   Skin: Negative.   Neurological: Negative.   Endo/Heme/Allergies: Negative.   Psychiatric/Behavioral: Negative.      Objective:   Vitals:   04/12/17 1718  BP: 132/70  Pulse: 72  Resp: 16          Physical Exam  Constitutional: He is well-developed, well-nourished, and in no distress.  Walker  HENT:  Head: Normocephalic.  Right Ear: Tympanic membrane, external ear and ear canal normal.  Left Ear: Tympanic membrane, external ear and ear canal normal.  Nose: Nose normal. No mucosal edema or rhinorrhea.  Mouth/Throat: Uvula is midline, oropharynx is clear and moist and mucous membranes are normal. No oropharyngeal exudate.  Eyes: Conjunctivae are normal.  Neck: Trachea normal. No tracheal tenderness present. No tracheal deviation present. No thyromegaly  present.  Cardiovascular: Normal rate, regular rhythm, S1 normal, S2 normal and normal heart sounds.  No murmur heard. Pulmonary/Chest: Breath sounds normal. No stridor. No respiratory distress. He has no wheezes. He has no rales.  Musculoskeletal: He exhibits no edema.  Lymphadenopathy:       Head (right side): No tonsillar adenopathy present.       Head (left side): No tonsillar adenopathy present.    He has no cervical adenopathy.  Neurological: He is alert.  Skin: No rash noted. He is not diaphoretic. No erythema. Nails show no clubbing.  Psychiatric: Mood and affect normal.    Diagnostics: none   Assessment and Plan:   1. Allergic rhinitis   2. Chronic  sinusitis, unspecified location   3. Headache disorder     1. Continue OTC Rhinocort one spray each nostril once a day.  2. Continue Montelukast 10 mg one tablet once a day  3. Continue nasal Azelastine 2 sprays each nostril 1-2 times per day  4. Can continue nasal saline washes  5.  For this prolonged upper airway issue use the following:   A.  Depo-Medrol 80 IM delivered in clinic today  B.  Clindamycin 150 - 1 tablet 3 times a day for the next 14 days  C.  Contact clinic in 2 weeks with update  6. Return to clinic in 12 months or earlier if problem  I will assume that Rosanne AshingJim has a prolonged episode of sinusitis as he has contracted in the past and treat him with broad-spectrum antibiotics and a systemic steroid as noted above.  If for some reason this does not result in good control of his symptoms then we will need to image his airway to assess the status of his sinus cavities.  He will contact me in 2 weeks with an update.  If he does well I will see him back in his clinic in 12 months or earlier if there is a problem.  If he still continues to have problems with headaches and a sinus CT scan does not identify any significant sinus disease then we will need to approach therapy directed against migraine headache.  Laurette SchimkeEric  Gatha Mcnulty, MD Allergy / Immunology [facility]

## 2017-04-12 NOTE — Patient Instructions (Addendum)
  1. Continue OTC Rhinocort one spray each nostril once a day.  2. Continue Montelukast 10 mg one tablet once a day  3. Continue nasal Azelastine 2 sprays each nostril 1-2 times per day  4. Can continue nasal saline washes  5.  For this prolonged upper airway issue use the following:   A.  Depo-Medrol 80 IM delivered in clinic today  B.  Clindamycin 150 - 1 tablet 3 times a day for the next 14 days  C.  Contact clinic in 2 weeks with update  6. Return to clinic in 12 months or earlier if problem

## 2017-04-12 NOTE — Progress Notes (Deleted)
Left sided neck pain and shoulder pain. Numbness and tingling in fingers of both hands. Having difficulty holding utensils to eat, combing hair. Pain is constant. Not as bad with sitting, lying down, leaning back. Hands are always numb. Reports several falls recently.

## 2017-04-13 ENCOUNTER — Encounter: Payer: Self-pay | Admitting: Allergy and Immunology

## 2017-04-13 ENCOUNTER — Encounter (INDEPENDENT_AMBULATORY_CARE_PROVIDER_SITE_OTHER): Payer: Self-pay | Admitting: Physical Medicine and Rehabilitation

## 2017-04-13 NOTE — Progress Notes (Signed)
[name] - 83 y.o. male MRN [id number]  Date of birth: [birth year]  Office Visit Note: Visit Date: 04/12/2017 PCP: Geoffry Paradise, MD Referred by: Geoffry Paradise, MD  Subjective: Chief Complaint  Patient presents with  . Neck - Pain  . Left Shoulder - Pain  . Right Hand - Numbness  . Left Hand - Numbness  . Left Knee - Pain   HPI: [name] is a very pleasant 83 year old right-hand-dominant gentleman that I have seen on several occasions when he has brought his wife in for treatment.  I have actually sent him on a few occasions as well.  I have not seen him in some time.  He comes in today with mainly increasing neck pain with numbness in the fingertips of both hands bilaterally.  He reports that he has had really increasing left-sided neck and shoulder pain.  He has noticed this more recently as he feels like he is leaning forward and looking down more often.  He also notices over the last several weeks that he is having difficulty holding utensils to eat and combing his hair.  He talks about sort of a discoordination type of an issue.  He said no history of prior stroke or other focal weakness.  He has not had any associated headaches or blurry vision.  He reports a constant pain in the left neck.  He says is not as bad when he is sitting or lying down or leaning back.  He is also had a history of several falls recently.  He attributes this to his knee arthritis in his left knee giving out.  He is followed by Dr. Clemetine Marker who is scheduling him for a knee replacement in December of this year.  The patient has not noted any radicular pain shooting down the arms.  There is not really had much in the way of right shoulder pain.  He has had no fevers chills or night sweats.  He has had some weight loss but nothing significant.  He has not had any specific trauma to the neck although he has had the falls recently.  He did fall on his left side he did have some imaging of the ribs performed  at urgent care.  He also states that he had x-rays taken of the cervical spine but we do not have those for review.  The patient has been taking Celebrex through his primary care physician Dr. Jacky Kindle.  He does feel like that has helped to a degree.  He is not taking any gabapentin or Neurontin.  He has a historical use of tramadol and Cymbalta but is not currently taking those.  He has not had any physical therapy of the cervical spine.  He has had therapy in the past for his legs and back.  They do live in [place] is a therapist there that they use.  We did take the liberty today to repeat cervical spine film x-rays and those are reviewed below.      Review of Systems  Constitutional: Negative for chills, fever, malaise/fatigue and weight loss.  HENT: Negative for hearing loss and sinus pain.   Eyes: Negative for blurred vision, double vision and photophobia.  Respiratory: Negative for cough and shortness of breath.   Cardiovascular: Negative for chest pain, palpitations and leg swelling.  Gastrointestinal: Negative for abdominal pain, nausea and vomiting.  Genitourinary: Negative for flank pain.  Musculoskeletal: Positive for back pain, falls, joint pain and neck pain. Negative for  myalgias.  Skin: Negative for itching and rash.  Neurological: Positive for tingling. Negative for tremors, focal weakness and weakness.  Endo/Heme/Allergies: Negative.   Psychiatric/Behavioral: Negative for depression.  All other systems reviewed and are negative.  Otherwise per HPI.  Assessment & Plan: Visit Diagnoses:  1. Neck pain   2. Cervical spondylosis without myelopathy   3. Paresthesia of skin   4. Idiopathic peripheral neuropathy   5. Myofascitis   6. Cervicalgia     Plan: Findings:  Worsening severe left neck and shoulder pain is most likely: Myofascial in nature given the exam and the x-ray images.  We talked about strengthening using isometric exercises of the cervical spine and I  did give him a sheet showing these exercises.  We also talked about myofascial pain as well as dry needling and trigger point injections.  Prescription for physical therapy take to the physical therapist that they typically used her other home.  I also want to get a CT scan of the cervical spine.  We chose a CT scan because the patient is severely claustrophobic and has a really hard time with MRIs.  The only reason to see this is that there is some foraminal narrowing as well as uncovertebral joint hypertrophy and it would be interesting to see if there is any significant bony stenosis that could explain some of his symptoms with what he feels like his discoordination.  Neurologic exam is fairly intact.  I do feel like he has some level of peripheral polyneuropathy.  He has tingling in the fingertips further questioning he feels like the bottom of his feet are like walking on pads although they are not painful.  His wife also has peripheral polyneuropathy.  Prior electrodiagnostic study in 2011 was normal in the upper extremities but this would not show peripheral neuropathy probably at that stage.  We gave him some information on alpha lipoic acid.  He already takes vitamin B12.  We will see him back in a month we specifically look good findings on the CT scan.  I also want him to start gabapentin just at night to see if it will help him rest.  We may want to increase this slowly see if that will help with his neuropathy type symptoms.  Depending on the outcome of the test and the therapist I might consider evaluation by Dr. Lurena Joiner Tat at [facility] neurology for movement disorder.  He really has no signs of Parkinson's or any other significant movement disorder other than the complaint of discoordination on the right.    Meds & Orders:  Meds ordered this encounter  Medications  . gabapentin (NEURONTIN) 300 MG capsule    Sig: Take 1 capsule (300 mg total) at bedtime by mouth.    Dispense:  90 capsule     Refill:  0    Orders Placed This Encounter  Procedures  . XR Cervical Spine 2 or 3 views  . CT CERVICAL SPINE WO CONTRAST    Follow-up: Return in about 4 weeks (around 05/10/2017).   Procedures: No procedures performed  No notes on file   Clinical History: Electrodiagnostic study of both upper limbs dated 05/13/2010 showed normal electrodiagnostic study  He reports that  has never smoked. he has never used smokeless tobacco. No results for input(s): HGBA1C, LABURIC in the last 8760 hours.  Objective:  VS:  HT:    WT:   BMI:     BP:114/89  HR:72bpm  TEMP: ( )  RESP:  Physical  Exam  Constitutional: He is oriented to person, place, and time. He appears well-developed and well-nourished. No distress.  HENT:  Head: Normocephalic and atraumatic.  Nose: Nose normal.  Mouth/Throat: Oropharynx is clear and moist.  Eyes: Conjunctivae are normal. Pupils are equal, round, and reactive to light.  Neck: Neck supple. No thyromegaly present.  Cardiovascular: Regular rhythm and intact distal pulses.  Pulmonary/Chest: Effort normal and breath sounds normal.  Abdominal: Soft. He exhibits no distension.  Musculoskeletal: He exhibits no deformity.  Examination of the cervical spine shows severe forward flexion with right rotation and significant paraspinal tightness and taut bands and trigger points throughout the levator scapula and rhomboids scalene.  He has some mild shoulder impingement bilaterally.  He has good strength in the upper extremities bilaterally without any deficits.  He has 2+ muscle stretch reflexes at the biceps and brachioradialis bilaterally.  He has a negative Hoffman's test bilaterally.  He can do rapid alternating movements in both hands effectively.  Examination of his left hand shows some distal amputation of the fourth digit.  He has had some chronic numbness from injury to the distal digits on the middle finger of the left hand.  He has intact sensation except for impaired  sensation of the fingertips.  There is no discoloration or synovitis or allodynia.  There is a negative pronator drift.  Lymphadenopathy:    He has no cervical adenopathy.  Neurological: He is alert and oriented to person, place, and time. He exhibits normal muscle tone. Coordination normal.  Skin: Skin is warm. No rash noted.  Psychiatric: He has a normal mood and affect. His behavior is normal.  Nursing note and vitals reviewed.   Ortho Exam Imaging: Xr Cervical Spine 2 Or 3 Views  Result Date: 04/13/2017 4 view cervical spine shows significantly increased forward flexion but with fairly normal lordosis.  There is severe facet arthropathy of the upper 3 cervical levels with uncovertebral joint hypertrophy as well.  There is right more than left foraminal narrowing.  There is no significant scoliosis.  There are no fractures or dislocations.   Past Medical/Family/Surgical/Social History: Medications & Allergies reviewed per EMR Patient Active Problem List   Diagnosis Date Noted  . DOE (dyspnea on exertion) 03/10/2017  . Acute sinusitis 02/15/2017  . Allergic rhinitis 02/15/2017  . Preoperative cardiovascular examination 08/16/2016  . Fatigue 09/11/2015  . Unstable angina (HCC) 08/06/2015  . Low TSH level 08/13/2014  . Orthostatic hypotension 12/17/2013  . CAD S/P PCI June 2008   . S/P CABG x 3   . Essential hypertension   . Dyslipidemia, goal LDL below 70   . Heart palpitations    Past Medical History:  Diagnosis Date  . CAD of autologous arterial graft 2010   Atretic LIMA; Myoview Jan 2015: Possible mild basal anteroseptal defect thought to be artifact (CRO early LAD ischemia due to inadeuate retrograde perfusion via SVG-Diag  . CAD S/P percutaneous coronary angioplasty June 2008;    a. 6/'08: PCI nongrafted distal RCA-PDA: Mini vision BMS 2.25 mm x 28 mm; b. Relook Cath 08/2015: Stable CAD. No culprit lesion. Widely patent PDA stent. Patent SVG-OM 2, SVG-D2. Essentially  atretic/small LIMA-LAD, but the LAD is perfused via SVG-D2  . Coronary artery disease involving left main coronary artery 1997-2010   Last cath 2010: 70-80 % ostial left main, 80-90% LAD, subtotal occluded circumflex, RCA now a patent stent; SVG-diagonal backfills LAD, atretic LIMA-LAD -- medical therapy; stable by relook cath in March 2017  .  Dyslipidemia, goal LDL below 70   . Heart palpitations    Never fully diagnosed.  . Hypertension   . Insomnia   . S/P CABG x 3 1997   LIMA-LAD, SVG-D2, SVG-OM --> LIMA known to be atretic, but SVG-D2 fills diagonal and LAD   History reviewed. No pertinent family history. Past Surgical History:  Procedure Laterality Date  . CARDIAC CATHETERIZATION  June 2008   showed high grade distal RCA and PDA stenosis and underwent PCI with a 2.25 mm x 28 mm Mini Vision bare- metal stent.  [name]. CARDIAC CATHETERIZATION  2010   Native coronaries at 70% to 80% ostial left main. LAD had diffuse 80% to 90% stenosis  . CORONARY ARTERY BYPASS GRAFT  [id number]   LIMA - LAD, SVG- diagonal, SVG-OM  . NM MYOVIEW LTD  02/25/2004; Jan 2015   a) EF 57%; b)  Normal Nuclear Stress Test - No evidence of Ischemia or Infarction. Normal LV Function & wall motion.   Social History   Occupational History  . Not on file  Tobacco Use  . Smoking status: Never Smoker  . Smokeless tobacco: Never Used  Substance and Sexual Activity  . Alcohol use: No  . Drug use: No  . Sexual activity: Not on file

## 2017-04-14 ENCOUNTER — Other Ambulatory Visit: Payer: Self-pay | Admitting: Orthopedic Surgery

## 2017-04-15 ENCOUNTER — Encounter: Payer: Self-pay | Admitting: *Deleted

## 2017-04-15 ENCOUNTER — Ambulatory Visit: Payer: Medicare PPO | Admitting: Cardiology

## 2017-04-19 ENCOUNTER — Telehealth (INDEPENDENT_AMBULATORY_CARE_PROVIDER_SITE_OTHER): Payer: Self-pay | Admitting: Physical Medicine and Rehabilitation

## 2017-04-19 NOTE — Telephone Encounter (Signed)
Will await results, likely needs consult with Neurology anyway.

## 2017-04-20 ENCOUNTER — Ambulatory Visit
Admission: RE | Admit: 2017-04-20 | Discharge: 2017-04-20 | Disposition: A | Discharge status: Home / Self Care | Payer: Medicare PPO | Source: Ambulatory Visit | Attending: Physical Medicine and Rehabilitation | Admitting: Physical Medicine and Rehabilitation

## 2017-04-20 DIAGNOSIS — M542 Cervicalgia: Secondary | ICD-10-CM

## 2017-04-21 ENCOUNTER — Other Ambulatory Visit (INDEPENDENT_AMBULATORY_CARE_PROVIDER_SITE_OTHER): Payer: Self-pay | Admitting: Physical Medicine and Rehabilitation

## 2017-04-21 ENCOUNTER — Encounter: Payer: Self-pay | Admitting: Neurology

## 2017-04-21 DIAGNOSIS — R202 Paresthesia of skin: Principal | ICD-10-CM

## 2017-04-21 DIAGNOSIS — R200 Anesthesia of skin: Secondary | ICD-10-CM

## 2017-04-21 NOTE — Progress Notes (Signed)
Referral placed to Dr. Allena KatzPatel at [facility] neurology for evaluation and treatment for this patient.  We did review his CT scan of his cervical spine which did not show any central cervical stenosis he did have some foraminal narrowing.  This really would not explain the numbness and tingling in severity of the hand is cold and with worsening foot pain has some burning.  He has not had prior electrodiagnostic studies of the legs.  I did look at prior 2010 myelogram of the lumbar spine he did have some lateral recess narrowing at L4-5 which was significant.  He reports on the phone today that he does not have any real leg pain is just his feet and hands.

## 2017-04-21 NOTE — Telephone Encounter (Signed)
Unable to send message on result note. I called the patient, and he does not have a preference for neurology referral. I will call and let him know which one he is being referred to. He does not have pain in his legs; his knees bother him sometimes.

## 2017-04-25 ENCOUNTER — Encounter: Payer: Self-pay | Admitting: Cardiology

## 2017-04-25 ENCOUNTER — Ambulatory Visit: Payer: Medicare PPO | Admitting: Cardiology

## 2017-04-25 VITALS — BP 110/68 | HR 60 | Ht 75.0 in | Wt 190.2 lb

## 2017-04-25 DIAGNOSIS — R5383 Other fatigue: Secondary | ICD-10-CM | POA: Diagnosis not present

## 2017-04-25 DIAGNOSIS — I10 Essential (primary) hypertension: Secondary | ICD-10-CM

## 2017-04-25 DIAGNOSIS — E785 Hyperlipidemia, unspecified: Secondary | ICD-10-CM | POA: Diagnosis not present

## 2017-04-25 DIAGNOSIS — R0609 Other forms of dyspnea: Secondary | ICD-10-CM | POA: Diagnosis not present

## 2017-04-25 DIAGNOSIS — R002 Palpitations: Secondary | ICD-10-CM

## 2017-04-25 DIAGNOSIS — Z01810 Encounter for preprocedural cardiovascular examination: Secondary | ICD-10-CM | POA: Diagnosis not present

## 2017-04-25 DIAGNOSIS — I2510 Atherosclerotic heart disease of native coronary artery without angina pectoris: Secondary | ICD-10-CM | POA: Diagnosis not present

## 2017-04-25 DIAGNOSIS — I951 Orthostatic hypotension: Secondary | ICD-10-CM

## 2017-04-25 DIAGNOSIS — Z9861 Coronary angioplasty status: Secondary | ICD-10-CM

## 2017-04-25 MED ORDER — LISINOPRIL 5 MG PO TABS: 5.0000 mg | ORAL_TABLET | Freq: Every day | ORAL | 3 refills | Status: DC

## 2017-04-25 NOTE — Patient Instructions (Addendum)
MEDICATION  CHANGES --  DECREASE TO 5 MG LISINOPRIL  ONE TABLET DAILY.   OKAY TO HAVE KNEE SURGERY with DR Sherlean FootLUCEY. --Hold Plavix 5-7 days preop.  Restart when safe.   Your physician wants you to follow-up in FEB 2019 WITH DR HARDING.   If you need a refill on your cardiac medications before your next appointment, please call your pharmacy.

## 2017-04-25 NOTE — Progress Notes (Signed)
PCP: Geoffry Paradise, MD  Clinic Note: Chief Complaint  Patient presents with  . Follow-up    Monitor and  Myoview results  . Pre-op Exam  . Coronary Artery Disease  . Hypotension    Orthostatic    HPI: Daniel [name] is a 83 y.o. male with a PMH below who presents today for 47-month follow-up of his coronary disease status post CABG. He is a former patient Dr. Julieanne Manson with a distant history of CAD-CABG dating back to 1997 in for LM CAD -- evaluation was for exertional angina. PTCA of rPDA for what sounded like acute CHF Sx - dyspnea, no angina.  He has a known atretic LIMA but the LAD fills via the SVG-D2. He had mild chronic angina in the past, but was evaluated by cardiac catheterization showing stable disease in 2017.  He has not been able to reduce Toprol to 50 twice a day -continuing 75 in the morning.  Daniel [name] was last seen March 10, 2017 --noted that he was not feeling well at that time.  Pronounced fatigue there is no dyspnea.  In addition to dyspnea, he also noted some chest tightness. - Plan was for knee surgery this spring -- did not happened b/c his wife had to have hip Sgx.  Recent Hospitalizations: None  Studies Reviewed:   48-Hour Holter Monitor 03/23/17: Relatively normal 48-hour monitor. Very infrequent ectopy. The patient may have symptoms from bigeminy or short PAT runs.  --> No symptoms noted on diary. Mostly sinus rhythm/sinus bradycardia sinus arrhythmia. First-degree AV block noted.  Rare PVCs noted.: PVCs with couplets and ventricular bigeminy noted.  Rare PACs with 2 pairs and 2 short PAT runs (longest appears to be 5 beats)    Myoview October 11: No significant reversible ischemia. Increased TID ratio 1.34. LVEF 54% with normal wall motion. This is a low risk study.  Interval History: Daniel [name] presents today still noticing that he has exertional dyspnea and fatigue, but mostly is related to his leg giving out on no active angina him from his  knee.  He has had several falls because of poor balance -he notes this is due to balance issues, but also related to his knee hurting. He does note that he is not having the exertional dyspnea is much, and generally feels a little better than he had been.  He denies any chest tightness or pressure at rest or exertion, but does note almost more because of his knee discomfort that he is short of breath with walking around.  He denies any PND, orthopnea or significant edema.  Rare occasional palpitations but nothing prolonged.  Nothing that is long enough to concern him with symptoms of dizziness, syncope or near syncope. No TIA or Ahmar Sudeck symptoms.  He still describes his major issue being fatigue that is associated with some exertional dyspnea.  Other Cardiovascular ROS: positive for - dyspnea on exertion and fatigue negative for - edema, loss of consciousness, murmur, orthopnea, palpitations, paroxysmal nocturnal dyspnea, rapid heart rate, shortness of breath or TIA/Amaurosis Fugax, claudication.   ROS: A comprehensive was performed. Review of Systems  Constitutional: Positive for malaise/fatigue and weight loss.  HENT: Negative for congestion and nosebleeds.   Respiratory: Negative for cough and shortness of breath (with exertion only).   Cardiovascular:       Per HPI  Gastrointestinal: Negative for abdominal pain, blood in stool, heartburn and melena.       Overall less appetite.  Genitourinary: Negative for  hematuria.  Musculoskeletal: Positive for falls (About 7 in the last few months), joint pain (Bilateral knee pain) and myalgias (Some nighttime cramps.). Negative for back pain.  Neurological: Positive for dizziness (Poor balance) and tingling (He has numbness and tingling in his hands/fingers and toes.  This seems to be more prominent now.  Happens more at night.). Negative for weakness.  Psychiatric/Behavioral: Negative.  Negative for memory loss. The patient is not  nervous/anxious.   All other systems reviewed and are negative.   Past Medical History:  Diagnosis Date  . CAD of autologous arterial graft 2010   Atretic LIMA; Myoview Jan 2015: Possible mild basal anteroseptal defect thought to be artifact (CRO early LAD ischemia due to inadeuate retrograde perfusion via SVG-Diag  . CAD S/P percutaneous coronary angioplasty June 2008;    a. 6/'08: PCI nongrafted distal RCA-PDA: Mini vision BMS 2.25 mm x 28 mm; b. Relook Cath 08/2015: Stable CAD. No culprit lesion. Widely patent PDA stent. Patent SVG-OM 2, SVG-D2. Essentially atretic/small LIMA-LAD, but the LAD is perfused via SVG-D2  . Coronary artery disease involving left main coronary artery 1997-2010   Last cath 2010: 70-80 % ostial left main, 80-90% LAD, subtotal occluded circumflex, RCA now a patent stent; SVG-diagonal backfills LAD, atretic LIMA-LAD -- medical therapy; stable by relook cath in March 2017  . Dyslipidemia, goal LDL below 70   . Heart palpitations    Never fully diagnosed.  . Hypertension   . Insomnia   . S/P CABG x 3 1997   LIMA-LAD, SVG-D2, SVG-OM --> LIMA known to be atretic, but SVG-D2 fills diagonal and LAD    Past Surgical History:  Procedure Laterality Date  . CARDIAC CATHETERIZATION  June 2008   showed high grade distal RCA and PDA stenosis and underwent PCI with a 2.25 mm x 28 mm Mini Vision bare- metal stent.  [name]. CARDIAC CATHETERIZATION  2010   Native coronaries at 70% to 80% ostial left main. LAD had diffuse 80% to 90% stenosis  . CARDIAC CATHETERIZATION N/A 08/08/2015   Procedure: Left Heart Cath and Cors/Grafts Angiography;  Surgeon: Marykay Lexavid W Renesha Lizama, MD;  [facility];  Service: Cardiovascular;  Laterality: N/A;  . CORONARY ARTERY BYPASS GRAFT  [id number]   LIMA - LAD, SVG- diagonal, SVG-OM  . NM MYOVIEW LTD  02/25/2004; Jan 2015   a) EF 57%; b)  Normal Nuclear Stress Test - No evidence of Ischemia or Infarction. Normal LV Function & wall motion.   Cath  08/2015: Stable disease from 2010. No culprit lesion for chest pain. Ischemia potentially related to antegrade flow in OM 1 or D1 based on LM and LAD stenosis.    Current Meds  Medication Sig  . azelastine (ASTELIN) 0.1 % nasal spray instill 2 sprays into each nostril twice a day  . budesonide (RHINOCORT AQUA) 32 MCG/ACT nasal spray 1 spray daily.  . celecoxib (CELEBREX) 200 MG capsule Take 200 mg daily by mouth.  . cholecalciferol (VITAMIN D) 1000 units tablet Take 1,000 Units by mouth daily.  . clopidogrel (PLAVIX) 75 MG tablet Take 75 mg by mouth daily.  . isosorbide mononitrate (IMDUR) 30 MG 24 hr tablet Take 30 mg by mouth daily.  . metoprolol succinate (TOPROL-XL) 50 MG 24 hr tablet Take 50-75 mg by mouth 2 (two) times daily. 75mg  in the morning, 50mg  in the evening  . montelukast (SINGULAIR) 10 MG tablet Take 10 mg by mouth at bedtime.  [name]. NITROSTAT 0.4 MG SL tablet place 1  tablet under the tongue every 5 minutes if needed for chest pain  . rosuvastatin (CRESTOR) 10 MG tablet Take 10 mg by mouth 3 (three) times a week.  . tamsulosin (FLOMAX) 0.4 MG CAPS capsule Take 1 capsule by mouth at bedtime.   . traMADol (ULTRAM) 50 MG tablet Take 100-200 mg by mouth daily as needed for moderate pain. Take 2-4 tablets daily  . vitamin B-12 (CYANOCOBALAMIN) 1000 MCG tablet Take 1,000 mcg by mouth daily.  [name] zolpidem (AMBIEN) 10 MG tablet take 1 tablet by mouth at bedtime if needed for sleep  . [DISCONTINUED] lisinopril (PRINIVIL,ZESTRIL) 10 MG tablet Take 10 mg by mouth daily.     Allergies  Allergen Reactions  . Sulfa Antibiotics Other (See Comments)    Pain in left side underneath ribcage-pancreas  . Penicillins Swelling     Swelling and redness in joints  . Statins Other (See Comments)    At high dose    Social History   Socioeconomic History  . Marital status: Married    Spouse name: None  . Number of children: None  . Years of education: None  . Highest education level: None    Social Needs  . Financial resource strain: None  . Food insecurity - worry: None  . Food insecurity - inability: None  . Transportation needs - medical: None  . Transportation needs - non-medical: None  Occupational History  . None  Tobacco Use  . Smoking status: Never Smoker  . Smokeless tobacco: Never Used  Substance and Sexual Activity  . Alcohol use: No  . Drug use: No  . Sexual activity: None  Other Topics Concern  . None  Social History Narrative   He is a married father of 2, grandfather 5. He is retired from [employer]. He travels back and forth between here and [place], [place] where part of his family lives.  He is usually very active, but is limited as far as standing exercise by his knee osteoarthritis.   He does not drink and does not smoke. Never smoked.    family history is not on file. -Noncontributory due to age.  He really is not that aware of significant disease from his parents.  Wt Readings from Last 3 Encounters:  04/25/17 190 lb 3.2 oz (86.3 kg)  03/17/17 199 lb (90.3 kg)  03/10/17 199 lb (90.3 kg)  -- less appetite  PHYSICAL EXAM BP 110/68   Pulse 60   Ht 6\' 3"  (1.905 m)   Wt 190 lb 3.2 oz (86.3 kg)   BMI 23.77 kg/m  -  Physical Exam  Constitutional: He is oriented to person, place, and time. He appears well-developed and well-nourished. No distress.  Well-groomed, healthy-appearing  HENT:  Head: Normocephalic and atraumatic.  Neck: Normal range of motion. Neck supple. No hepatojugular reflux and no JVD present. Carotid bruit is not present.  Cardiovascular: Normal rate, regular rhythm, intact distal pulses and normal pulses.  No extrasystoles are present. PMI is not displaced. Exam reveals no gallop and no distant heart sounds.  No murmur heard. Pulmonary/Chest: Effort normal and breath sounds normal. No respiratory distress. He has no wheezes. He has no rales.  Abdominal: Soft. Bowel sounds are normal. He exhibits no distension. There is no  tenderness. There is no rebound.  Musculoskeletal: Normal range of motion. He exhibits no edema.  Bilateral knees are tender to palpation with mild swelling  Neurological: He is alert and oriented to person, place, and time.  Skin: Skin is  warm and dry. No rash noted. No erythema.  Psychiatric: He has a normal mood and affect. His behavior is normal. Judgment and thought content normal.  Nursing note and vitals reviewed.   Adult ECG Report n/a  Other studies Reviewed: Additional studies/ records that were reviewed today include:  Recent Labs:  Labs checked by PCP -- not available   ASSESSMENT / PLAN: Problem List Items Addressed This Visit    CAD S/P PCI June 2008 (Chronic)    Distant history of CAD back in 1997 with CABG x3 for left main disease and subsequent PCI to the RCA-PDA in June 2008.  This is the ungrafted RCA.  Unfortunately, he has an atretic LIMA-LAD but has stable perfusion of the LAD via retrograde flow from the SVG-D2.  SVG to OM was also widely patent. In March 2017 -his coronary disease appears stable with a widely patent PDA stent and patent vein grafts.  He just completed a Myoview that showed normal EF with no infarct or ischemia.  LOW RISK.  This would argue against his exertional dyspnea being ischemic, probably more related to deconditioning and knee pain. He is on stable dose of beta-blocker that has him taking 75 mg in the morning and 50 mg in the evening of Toprol. He is on 10 mg lisinopril which have been reduced to 5 mg to allow some more permissive hypertension. Remains on stable dose of Imdur which we could potentially consider stopping if he is noticing some dizziness. He is still on Plavix plus statin.  No aspirin.      Relevant Medications   lisinopril (PRINIVIL,ZESTRIL) 5 MG tablet   DOE (dyspnea on exertion) (Chronic)    This is led to some concern about possible anginal equivalent, however his Myoview was nonischemic.  In the setting of recent  catheterization just a year ago, I am inclined to believe that he is nonischemic. He could have some diastolic dysfunction, however seems euvolemic. I question how much of this could simply be deconditioning related to bilateral knee pain.      Dyslipidemia, goal LDL below 70 (Chronic)    Remains on Crestor 3 times a week at 10 mg.  I have not seen recent labs.  They have been followed by his PCP.  He seems to be tolerating Crestor relatively well.  Depending on his labs are, we may need to increase his dose      Relevant Medications   lisinopril (PRINIVIL,ZESTRIL) 5 MG tablet   Essential hypertension (Chronic)    Blood pressure is borderline low today.  With him not eating and drinking as well as he has been doing, and having some dizziness and fatigue, I am going to back off on his ACE inhibitor to 5 mg daily to allow for more permissive hypertension. Unfortunately with his palpitations, we are obligated to continue current dose of beta-blocker for now.      Relevant Medications   lisinopril (PRINIVIL,ZESTRIL) 5 MG tablet   Fatigue (Chronic)    Thankfully, it does not seem like his fatigue and dyspnea is related to ischemia based on his negative Myoview. Plan we will back off on his blood pressure by reducing lisinopril.  His fatigue seems to have predated his increased dose of metoprolol however, making it likely less beneficial to reduce his beta-blocker dose.      Heart palpitations (Chronic)    These seem to be relatively well controlled on his current dose of beta-blocker.   He did not have that  much in the way of any ectopy on his 48-hour monitor, but had to short runs of PAT. no evidence of A. fib thankfully. We can consider potentially backing off on his dose to twice daily and see how his symptoms go.      Orthostatic hypotension (Chronic)    We had previously had him taking equivalent of 10 mg of lisinopril twice daily, recently reduced to 10 mg once a day, and I am going  to reduce to 5 mg now to avoid hypotension. Encourage adequate hydration.  He has had no heart failure symptoms.      Relevant Medications   lisinopril (PRINIVIL,ZESTRIL) 5 MG tablet   Preoperative cardiovascular examination - Primary    He now has knee surgery scheduled for December.  I think he definitely needs to have this done.  The plan is to have the first knee done and then shortly thereafter have the next knee done once he is able to recover.  No active angina symptoms with negative Myoview.  No heart failure symptoms. No history of stroke.  Normal renal function and nondiabetic. Knee surgery is a low risk surgery.  PREOPERATIVE CARDIAC RISK ASSESSMENT   Revised Cardiac Risk Index:  High Risk Surgery: no; KNEE SURGERY IS LOW RISK   Defined as Intraperitoneal, intrathoracic or suprainguinal vascular  Active CAD: no; No angina & non-ischemic Myoview  CHF: no; euvolemic.  Normal EF  Cerebrovascular Disease: no;  Diabetes: no; On Insulin: no  CKD (Cr >~ 2): no  Total: 0 Estimated Risk of Adverse Outcome: LOW RISK   Estimated Risk of MI, PE, VF/VT (Cardiac Arrest), Complete Heart Block: ~1 % mostly based on age.  He has just had a Myoview stress test which was negative.  No further cardiac evaluation is required. He is far enough out from PCI that he is cleared for stopping Plavix 5-7 days pre-op.         Current medicines are reviewed at length with the patient today. (+/- concerns) None The following changes have been made: None  Patient Instructions  MEDICATION  CHANGES --  DECREASE TO 5 MG LISINOPRIL  ONE TABLET DAILY.   OKAY TO HAVE KNEE SURGERY with DR Sherlean Foot. --Hold Plavix 5-7 days preop.  Restart when safe.   Your physician wants you to follow-up in FEB 2019 WITH DR Daniel [name].   If you need a refill on your cardiac medications before your next appointment, please call your pharmacy.     Studies Ordered:   No orders of the defined types were  placed in this encounter.     Daniel [name], M.D., M.S. Interventional Cardiologist   Pager # [phone] Phone # [phone] [address] [place], [place] [address]

## 2017-04-26 NOTE — Assessment & Plan Note (Signed)
Blood pressure is borderline low today.  With him not eating and drinking as well as he has been doing, and having some dizziness and fatigue, I am going to back off on his ACE inhibitor to 5 mg daily to allow for more permissive hypertension. Unfortunately with his palpitations, we are obligated to continue current dose of beta-blocker for now.

## 2017-04-26 NOTE — Assessment & Plan Note (Signed)
Remains on Crestor 3 times a week at 10 mg.  I have not seen recent labs.  They have been followed by his PCP.  He seems to be tolerating Crestor relatively well.  Depending on his labs are, we may need to increase his dose

## 2017-04-26 NOTE — Assessment & Plan Note (Addendum)
Distant history of CAD back in 1997 with CABG x3 for left main disease and subsequent PCI to the RCA-PDA in June 2008.  This is the ungrafted RCA.  Unfortunately, he has an atretic LIMA-LAD but has stable perfusion of the LAD via retrograde flow from the SVG-D2.  SVG to OM was also widely patent. In March 2017 -his coronary disease appears stable with a widely patent PDA stent and patent vein grafts.  He just completed a Myoview that showed normal EF with no infarct or ischemia.  LOW RISK.  This would argue against his exertional dyspnea being ischemic, probably more related to deconditioning and knee pain. He is on stable dose of beta-blocker that has him taking 75 mg in the morning and 50 mg in the evening of Toprol. He is on 10 mg lisinopril which have been reduced to 5 mg to allow some more permissive hypertension. Remains on stable dose of Imdur which we could potentially consider stopping if he is noticing some dizziness. He is still on Plavix plus statin.  No aspirin.

## 2017-04-26 NOTE — Assessment & Plan Note (Signed)
These seem to be relatively well controlled on his current dose of beta-blocker.   He did not have that much in the way of any ectopy on his 48-hour monitor, but had to short runs of PAT. no evidence of A. fib thankfully. We can consider potentially backing off on his dose to twice daily and see how his symptoms go.

## 2017-04-26 NOTE — Assessment & Plan Note (Signed)
Thankfully, it does not seem like his fatigue and dyspnea is related to ischemia based on his negative Myoview. Plan we will back off on his blood pressure by reducing lisinopril.  His fatigue seems to have predated his increased dose of metoprolol however, making it likely less beneficial to reduce his beta-blocker dose.

## 2017-04-26 NOTE — Assessment & Plan Note (Signed)
We had previously had him taking equivalent of 10 mg of lisinopril twice daily, recently reduced to 10 mg once a day, and I am going to reduce to 5 mg now to avoid hypotension. Encourage adequate hydration.  He has had no heart failure symptoms.

## 2017-04-26 NOTE — Assessment & Plan Note (Signed)
This is led to some concern about possible anginal equivalent, however his Myoview was nonischemic.  In the setting of recent catheterization just a year ago, I am inclined to believe that he is nonischemic. He could have some diastolic dysfunction, however seems euvolemic. I question how much of this could simply be deconditioning related to bilateral knee pain.

## 2017-04-26 NOTE — Assessment & Plan Note (Signed)
He now has knee surgery scheduled for December.  I think he definitely needs to have this done.  The plan is to have the first knee done and then shortly thereafter have the next knee done once he is able to recover.  No active angina symptoms with negative Myoview.  No heart failure symptoms. No history of stroke.  Normal renal function and nondiabetic. Knee surgery is a low risk surgery.  PREOPERATIVE CARDIAC RISK ASSESSMENT   Revised Cardiac Risk Index:  High Risk Surgery: no; KNEE SURGERY IS LOW RISK   Defined as Intraperitoneal, intrathoracic or suprainguinal vascular  Active CAD: no; No angina & non-ischemic Myoview  CHF: no; euvolemic.  Normal EF  Cerebrovascular Disease: no;  Diabetes: no; On Insulin: no  CKD (Cr >~ 2): no  Total: 0 Estimated Risk of Adverse Outcome: LOW RISK   Estimated Risk of MI, PE, VF/VT (Cardiac Arrest), Complete Heart Block: ~1 % mostly based on age.  He has just had a Myoview stress test which was negative.  No further cardiac evaluation is required. He is far enough out from PCI that he is cleared for stopping Plavix 5-7 days pre-op.

## 2017-05-02 ENCOUNTER — Encounter: Payer: Self-pay | Admitting: Neurology

## 2017-05-02 ENCOUNTER — Other Ambulatory Visit (INDEPENDENT_AMBULATORY_CARE_PROVIDER_SITE_OTHER): Payer: Medicare PPO

## 2017-05-02 ENCOUNTER — Ambulatory Visit: Payer: Medicare PPO | Admitting: Neurology

## 2017-05-02 VITALS — BP 150/80 | HR 80 | Ht 75.0 in | Wt 190.5 lb

## 2017-05-02 DIAGNOSIS — G629 Polyneuropathy, unspecified: Secondary | ICD-10-CM

## 2017-05-02 LAB — FOLATE: Folate: >23.9 ng/mL (ref >5.9)

## 2017-05-02 LAB — C-REACTIVE PROTEIN: CRP: 0.3 mg/dL — ABNORMAL LOW (ref 0.5–20.0)

## 2017-05-02 LAB — VITAMIN B12: Vitamin B-12: >1500 pg/mL — ABNORMAL HIGH (ref 211–911)

## 2017-05-02 LAB — TSH: TSH: 0.2 u[IU]/mL — ABNORMAL LOW (ref 0.35–4.50)

## 2017-05-02 LAB — SEDIMENTATION RATE: Sed Rate: 25 mm/hr — ABNORMAL HIGH (ref 0–20)

## 2017-05-02 NOTE — Progress Notes (Signed)
[facility] Clinic Note - Initial Visit   Date: 05/02/17  Daniel [name] MRN: [id number] DOB: Oct 19, 1932   Dear Dr. Ernestina Patches:  Thank you for your kind referral of Daniel [name] for consultation of bilateral hand paresthesias. Although his history is well known to you, please allow [place] to reiterate it for the purpose of our medical record. The patient was accompanied to the clinic by wife who also provides collateral information.     History of Present Illness: Daniel [name] is a 83 y.o. right-handed Caucasian male with CAD s/p CABG, hyperlipidemia, hypertension, OA presenting for evaluation of bilateral hand paresthesias.    Starting around September 2018, he began having numbness over the fingertips involving all the fingers on both hands.  He denies any burning pain or tingling. The numbness feels as if it is mildly better in the morning, but overall, does not change.  He has been dropping objects and has difficulty with fine finger movements, such as turning a newspaper or holding objects.  He endorses some neck pain and had CT cervical spine which shows moderate spondylosis at C5-6 and C6-7, as well as multilevel biforaminal narrowing.   For the past 5 years, he has had numbness over the soles of the feet and over the past few weeks, the numbness on the left foot has started to extend into his lower leg.  Around the same time, he began having falls because of knees buckling. He does not recall having pain with his knees when it buckles. He has had about 7 falls since September and been walking with a walker.  He denies muscle twitches or cramps.  He will be having R knee replacement on June 06 2017.   Out-side paper records, electronic medical record, and images have been reviewed where available and summarized as:  CT cervical spine wo contrast 04/20/2017:  No acute findings. Moderate spondylosis of the cervical spine with mild disc disease at C5-6 and  C6-7 levels. Significant multilevel bilateral neural foraminal narrowing due to adjacent bony spurring. Goiter.  Past Medical History:  Diagnosis Date  . CAD of autologous arterial graft 2010   Atretic LIMA; Myoview Jan 2015: Possible mild basal anteroseptal defect thought to be artifact (CRO early LAD ischemia due to inadeuate retrograde perfusion via SVG-Diag  . CAD S/P percutaneous coronary angioplasty June 2008;    a. 6/'08: PCI nongrafted distal RCA-PDA: Mini vision BMS 2.25 mm x 28 mm; b. Relook Cath 08/2015: Stable CAD. No culprit lesion. Widely patent PDA stent. Patent SVG-OM 2, SVG-D2. Essentially atretic/small LIMA-LAD, but the LAD is perfused via SVG-D2  . Coronary artery disease involving left main coronary artery 1997-2010   Last cath 2010: 70-80 % ostial left main, 80-90% LAD, subtotal occluded circumflex, RCA now a patent stent; SVG-diagonal backfills LAD, atretic LIMA-LAD -- medical therapy; stable by relook cath in March 2017  . Dyslipidemia, goal LDL below 70   . Heart palpitations    Never fully diagnosed.  . Hypertension   . Insomnia   . S/P CABG x 3 1997   LIMA-LAD, SVG-D2, SVG-OM --> LIMA known to be atretic, but SVG-D2 fills diagonal and LAD    Past Surgical History:  Procedure Laterality Date  . CARDIAC CATHETERIZATION  June 2008   showed high grade distal RCA and PDA stenosis and underwent PCI with a 2.25 mm x 28 mm Mini Vision bare- metal stent.  [name] CARDIAC CATHETERIZATION  2010   Native coronaries at 70% to  80% ostial left main. LAD had diffuse 80% to 90% stenosis  . CARDIAC CATHETERIZATION N/A 08/08/2015   Procedure: Left Heart Cath and Cors/Grafts Angiography;  Surgeon: Leonie Man, MD;  Location: [facility];  Service: Cardiovascular;  Laterality: N/A;  . CORONARY ARTERY BYPASS GRAFT  [id number]   LIMA - LAD, SVG- diagonal, SVG-OM  . NM MYOVIEW LTD  02/25/2004; Jan 2015   a) EF 57%; b)  Normal Nuclear Stress Test - No evidence of Ischemia or  Infarction. Normal LV Function & wall motion.     Medications:  Outpatient Encounter Medications as of 05/02/2017  Medication Sig Note  . azelastine (ASTELIN) 0.1 % nasal spray instill 2 sprays into each nostril twice a day   . budesonide (RHINOCORT AQUA) 32 MCG/ACT nasal spray 1 spray daily.   . cholecalciferol (VITAMIN D) 1000 units tablet Take 1,000 Units by mouth daily.   . clopidogrel (PLAVIX) 75 MG tablet Take 75 mg by mouth daily.   . isosorbide mononitrate (IMDUR) 30 MG 24 hr tablet Take 30 mg by mouth daily.   [name] lisinopril (PRINIVIL,ZESTRIL) 5 MG tablet Take 1 tablet (5 mg total) daily by mouth.   . metoprolol succinate (TOPROL-XL) 50 MG 24 hr tablet Take 50-75 mg by mouth 2 (two) times daily. 11m in the morning, 533min the evening 08/07/2015: Verified dosing with pt and pharmacy  . montelukast (SINGULAIR) 10 MG tablet Take 10 mg by mouth at bedtime.   . Marland KitchenITROSTAT 0.4 MG SL tablet place 1 tablet under the tongue every 5 minutes if needed for chest pain   . tamsulosin (FLOMAX) 0.4 MG CAPS capsule Take 1 capsule by mouth at bedtime.    . traMADol (ULTRAM) 50 MG tablet Take 100-200 mg by mouth daily as needed for moderate pain. Take 2-4 tablets daily   . vitamin B-12 (CYANOCOBALAMIN) 1000 MCG tablet Take 1,000 mcg by mouth daily.   . [DISCONTINUED] celecoxib (CELEBREX) 200 MG capsule Take 200 mg daily by mouth.   . [DISCONTINUED] DULoxetine (CYMBALTA) 20 MG capsule Take 20 mg by mouth daily. [facility]18/2016: [facility] Take 20 mg by mouth daily.  . [DISCONTINUED] gabapentin (NEURONTIN) 300 MG capsule Take 1 capsule (300 mg total) at bedtime by mouth. (Patient not taking: Reported on 04/25/2017)   . [DISCONTINUED] rosuvastatin (CRESTOR) 10 MG tablet Take 10 mg by mouth 3 (three) times a week.   . [DISCONTINUED] zolpidem (AMBIEN) 10 MG tablet take 1 tablet by mouth at bedtime if needed for sleep    No facility-administered encounter  medications on file as of 05/02/2017.      Allergies:  Allergies  Allergen Reactions  . Sulfa Antibiotics Other (See Comments)    Pain in left side underneath ribcage-pancreas  . Penicillins Swelling     Swelling and redness in joints  . Statins Other (See Comments)    At high dose    Family History: Family History  Problem Relation Age of Onset  . Stroke Mother 9818. Heart disease Mother   . Arthritis Mother   . Heart attack Father 7523. Other Sister   . Heart disease Brother     Social History: Social History   Tobacco Use  . Smoking status: Never Smoker  . Smokeless tobacco: Never Used  Substance Use Topics  . Alcohol use: No  . Drug use: No   Social History   Social History Narrative   He is a  married father of 2, grandfather 39. He is retired from [employer], Programme researcher, broadcasting/film/video. 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  He is usually very active, but is limited as far as standing exercise by his knee osteoarthritis.   He does not drink and does not smoke. Never smoked.  Live with wife in a one story home.  Education: college.     Review of Systems:  CONSTITUTIONAL: No fevers, chills, night sweats, or weight loss.   EYES: No visual changes or eye pain ENT: No hearing changes.  No history of nose bleeds.   RESPIRATORY: No cough, wheezing and shortness of breath.   CARDIOVASCULAR: Negative for chest pain, and palpitations.   GI: Negative for abdominal discomfort, blood in stools or black stools.  No recent change in bowel habits.   GU:  No history of incontinence.   MUSCLOSKELETAL: +history of joint pain or swelling.  No myalgias.   SKIN: Negative for lesions, rash, and itching.   HEMATOLOGY/ONCOLOGY: Negative for prolonged bleeding, bruising easily, and swollen nodes.  No history of cancer.   ENDOCRINE: Negative for cold or heat intolerance, polydipsia or goiter.   PSYCH:  No  depression or anxiety symptoms.   NEURO: As Above.   Vital Signs:  BP (!) 150/80   Pulse 80   Ht 6' 3"  (1.905 m)   Wt 190 lb 8 oz (86.4 kg)   SpO2 99%   BMI 23.81 kg/m    General Medical Exam:   General:  Well appearing, comfortable.   Eyes/ENT: see cranial nerve examination.   Neck: No masses appreciated.  Full range of motion without tenderness.  No carotid bruits. Respiratory:  Clear to auscultation, good air entry bilaterally.   Cardiac:  Regular rate and rhythm, no murmur.   Extremities:  No deformities, edema, or skin discoloration.  Skin:  No rashes or lesions.  Neurological Exam: MENTAL STATUS including orientation to time, place, person, recent and remote memory, attention span and concentration, language, and fund of knowledge is normal.  Speech is not dysarthric.  CRANIAL NERVES: II:  No visual field defects.  Unremarkable fundi.   III-IV-VI: Small pupils bilaterally, reactive.  Normal conjugate, extra-ocular eye movements in all directions of gaze.  No nystagmus.  No ptosis.   V:  Normal facial sensation.   VII:  Normal facial symmetry and movements.    VIII:  Normal hearing and vestibular function.   IX-X:  Normal palatal movement.   XI:  Normal shoulder shrug and head rotation.   XII:  Normal tongue strength and range of motion, no deviation or fasciculation.  MOTOR:  Left ABP atrophy.  No fasciculations or abnormal movements.  No pronator drift.  Tone is normal.    Right Upper Extremity:    Left Upper Extremity:    Deltoid  5/5   Deltoid  5/5   Biceps  5/5   Biceps  5/5   Triceps  5/5   Triceps  5/5   Wrist extensors  5/5   Wrist extensors  5/5   Wrist flexors  5/5   Wrist flexors  5/5   Finger extensors  5-/5   Finger extensors  5-/5   Finger flexors  5/5   Finger flexors  5/5   Dorsal interossei  5/5   Dorsal interossei  5/5   Abductor pollicis  5/5   Abductor pollicis  4/5   Tone (Ashworth scale)  0  Tone (Ashworth scale)  0   Right Lower Extremity:     Left Lower Extremity:    Hip flexors  4+/5   Hip flexors  4+/5   Hip extensors  5/5   Hip extensors  5/5   Knee flexors  5/5   Knee flexors  5/5   Knee extensors  5/5   Knee extensors  5/5   Dorsiflexors  5/5   Dorsiflexors  5/5   Plantarflexors  5/5   Plantarflexors  5/5   Toe extensors  4/5   Toe extensors  4/5   Toe flexors  4/5   Toe flexors  4/5   Tone (Ashworth scale)  0  Tone (Ashworth scale)  0   MSRs:  Right                                                                 Left brachioradialis 2+  brachioradialis 2+  biceps 2+  biceps 2+  triceps 2+  triceps 2+  patellar 0  Patellar 0  ankle jerk 0  ankle jerk 0  Hoffman no  Hoffman no  plantar response mute  plantar response mute   SENSORY:  Vibration is reduced to 50% at the MCP, ankles, and trace at the great toe (L>R).  Temperature and pin prick is reduced over the finger tips and distal to mid-calf bilaterally.  There is mild sway with Rhomberg testing.  COORDINATION/GAIT: Normal finger-to- nose-finger and heel-to-shin.  Intact rapid alternating movements bilaterally.  Able to rise from a chair without using arms.  Gait narrow based and stable. Tandem and stressed gait intact.    IMPRESSION: Mr. Weatherall is a 83 year-old man referred for evaluation of subacute paresthesias involving the hands and worsening neuropathy of the feet.  Exam shows absent reflexes in the legs and reduced sensation over the palmer surface of the hands and distal to mid-calf.  There is mild weakness distally and the hands and feet as well as hip flexor weakness.  These findings cannot be explained by his cervical spine findings of cervical spondylosis and multilevel foraminal narrowing and are more concerning for subacute sensorimotor neuropathy.  He needs to be evaluated for non-length dependent neuropathy (i.e. CIDP) with NCS/EMG and serology testing.  If there are fibrillation potentials or demyelinating changes on his NCS, he will need CSF testing.     PLAN/RECOMMENDATIONS:  1.  NCS/EMG of the left arm and leg 2.  Check ESR, CRP, vitamin B12, MMA, folate, copper, SPEP with IFE, TSH, ACE 3.  Fall precautions discussed 4.  Because he complains of primary numbness, I will not start him on any neuralgesic medications.  This will be addressed if he developed painful tingling/burning paresthesias   Further recommendations will be based on the results of his testing   The duration of this appointment visit was 60 minutes of face-to-face time with the patient.  Greater than 50% of this time was spent in counseling, explanation of diagnosis, planning of further management, and coordination of care.   Thank you for allowing me to participate in patient's care.  If I can answer any additional questions, I would be pleased to do so.    Sincerely,    Keinan Brouillet K. Posey Pronto, DO

## 2017-05-02 NOTE — Patient Instructions (Addendum)
Check labs ° °Nerve testing of the left arm and leg ° °ELECTROMYOGRAM AND NERVE CONDUCTION STUDIES (EMG/NCS) INSTRUCTIONS ° °How to Prepare °The neurologist conducting the EMG will need to know if you have certain medical conditions. Tell the neurologist and other EMG lab personnel if you: °Have a pacemaker or any other electrical medical device °Take blood-thinning medications °Have hemophilia, a blood-clotting disorder that causes prolonged bleeding °Bathing °Take a shower or bath shortly before your exam in order to remove oils from your skin. Don’t apply lotions or creams before the exam.  °What to Expect °You’ll likely be asked to change into a hospital gown for the procedure and lie down on an examination table. The following explanations can help you understand what will happen during the exam.  °Electrodes. The neurologist or a technician places surface electrodes at various locations on your skin depending on where you’re experiencing symptoms. Or the neurologist may insert needle electrodes at different sites depending on your symptoms.  °Sensations. The electrodes will at times transmit a tiny electrical current that you may feel as a twinge or spasm. The needle electrode may cause discomfort or pain that usually ends shortly after the needle is removed. °If you are concerned about discomfort or pain, you may want to talk to the neurologist about taking a short break during the exam.  °Instructions. During the needle EMG, the neurologist will assess whether there is any spontaneous electrical activity when the muscle is at rest - activity that isn’t present in healthy muscle tissue - and the degree of activity when you slightly contract the muscle.  °He or she will give you instructions on resting and contracting a muscle at appropriate times. Depending on what muscles and nerves the neurologist is examining, he or she may ask you to change positions during the exam.  °After your EMG °You may experience  some temporary, minor bruising where the needle electrode was inserted into your muscle. This bruising should fade within several days. If it persists, contact your primary care doctor.  ° °

## 2017-05-05 LAB — PROTEIN ELECTROPHORESIS, SERUM
Abnormal Protein Band1: 1.2 g/dL — ABNORMAL HIGH
Albumin ELP: 3.9 g/dL (ref 3.8–4.8)
Alpha 1: 0.3 g/dL (ref 0.2–0.3)
Alpha 2: 0.8 g/dL (ref 0.5–0.9)
Beta 2: 0.3 g/dL (ref 0.2–0.5)
Beta Globulin: 0.4 g/dL (ref 0.4–0.6)
Gamma Globulin: 1.5 g/dL (ref 0.8–1.7)
Total Protein: 7.2 g/dL (ref 6.1–8.1)

## 2017-05-05 LAB — IMMUNOFIXATION ELECTROPHORESIS
IgG (Immunoglobin G), Serum: 1624 mg/dL — ABNORMAL HIGH (ref 694–1618)
IgM, Serum: 81 mg/dL (ref 48–271)
Immunofix Electr Int: DETECTED
Immunoglobulin A: 140 mg/dL (ref 81–463)

## 2017-05-05 LAB — COPPER, SERUM: Copper: 88 ug/dL (ref 70–175)

## 2017-05-05 LAB — METHYLMALONIC ACID, SERUM: Methylmalonic Acid, Quant: 145 nmol/L (ref 87–318)

## 2017-05-05 LAB — ANGIOTENSIN CONVERTING ENZYME: Angiotensin-Converting Enzyme: 6 U/L — ABNORMAL LOW (ref 9–67)

## 2017-05-06 ENCOUNTER — Other Ambulatory Visit: Payer: Self-pay | Admitting: *Deleted

## 2017-05-06 ENCOUNTER — Telehealth: Payer: Self-pay | Admitting: *Deleted

## 2017-05-06 DIAGNOSIS — R899 Unspecified abnormal finding in specimens from other organs, systems and tissues: Secondary | ICD-10-CM

## 2017-05-06 NOTE — Telephone Encounter (Signed)
-----   Message from Glendale Chardonika K Patel, DO sent at 05/05/2017  5:30 PM EST ----- Please inform patient that his labs show abnormal thyroid studies and protein in the blood. I would like to check free T3 and T4 as well as UPEP.  Once we have the results of his EMG, we'll make further recommendations as to whether he needs to see a hematologist again.

## 2017-05-06 NOTE — Telephone Encounter (Signed)
Patient given the results and instructions.  He will come in Monday for labs.

## 2017-05-09 ENCOUNTER — Other Ambulatory Visit (INDEPENDENT_AMBULATORY_CARE_PROVIDER_SITE_OTHER): Payer: Medicare PPO

## 2017-05-09 DIAGNOSIS — R899 Unspecified abnormal finding in specimens from other organs, systems and tissues: Secondary | ICD-10-CM | POA: Diagnosis not present

## 2017-05-09 LAB — T3, FREE: T3, Free: 3.3 pg/mL (ref 2.3–4.2)

## 2017-05-09 LAB — T4, FREE: Free T4: 0.8 ng/dL (ref 0.60–1.60)

## 2017-05-10 ENCOUNTER — Ambulatory Visit: Payer: Medicare PPO | Admitting: Neurology

## 2017-05-11 ENCOUNTER — Ambulatory Visit (INDEPENDENT_AMBULATORY_CARE_PROVIDER_SITE_OTHER): Payer: Medicare PPO | Admitting: Physical Medicine and Rehabilitation

## 2017-05-11 LAB — PROTEIN ELECTROPHORESIS,RANDOM URN
Albumin: 45 %
Alpha-1-Globulin, U: 4 %
Alpha-2-Globulin, U: 11 %
Beta Globulin, U: 21 %
Creatinine, Urine: 164 mg/dL (ref 20–320)
Gamma Globulin, U: 19 %
Protein/Creat Ratio: 79 mg/g creat (ref 22–128)
Total Protein, Urine: 13 mg/dL (ref 5–25)

## 2017-05-11 LAB — IMMUNOFIXATION INTE: Interpretation: DETECTED

## 2017-05-19 ENCOUNTER — Ambulatory Visit (INDEPENDENT_AMBULATORY_CARE_PROVIDER_SITE_OTHER): Payer: Medicare PPO | Admitting: Neurology

## 2017-05-19 DIAGNOSIS — G629 Polyneuropathy, unspecified: Secondary | ICD-10-CM | POA: Diagnosis not present

## 2017-05-19 NOTE — Procedures (Signed)
[facility]  [address], [address]  [place], [place] [address] Tel: [phone] Fax:  [phone] Test Date:  05/19/2017  Patient: Daniel NeuJames Pearse DOB: 11/27/1932 Physician: Nita Sickleonika Patel, DO  Sex: Male Height: 6\' 3"  Ref Phys: Nita Sickleonika Patel, DO  ID#: [id number] Temp: 32.6C Technician:    Patient Complaints: This is a 83 year old man referred for evaluation of subacute numbness of the hands and generalized weakness.  NCV & EMG Findings: Extensive electrodiagnostic testing of the left upper and lower extremity shows:  1. Right median, radial, and ulnar sensory responses are within normal limits. 2. Right median and ulnar motor responses are within normal limits. 3. Right sural and superficial peroneal sensory responses are absent. 4. Right tibial motor response shows reduced amplitude and slowed conduction velocity. The peroneal motor response is absent at the extensor digitorum brevis, and normal at the tibialis anterior. 5. Right tibial H reflex study shows prolonged latency. 6. There is no evidence of active or chronic motor axon loss changes affecting any of the tested muscles in the left upper extremity. 7. Chronic motor axon loss changes are seen affecting the left gastrocnemius and biceps femoris short head muscles, without accompanied active denervation.  Impression: 1. 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  A superimposed S1 radiculopathy affecting the left lower extremity is also likely. 2. There is no evidence of a sensorimotor neuropathy or cervical radiculopathy affecting the left upper extremity.   ___________________________ Nita Sickleonika Patel, DO    Nerve Conduction Studies Anti Sensory Summary Table   Site NR Peak (ms) Norm Peak (ms) P-T Amp (V) Norm P-T Amp  Left Median Anti Sensory (2nd Digit)  32.6C  Wrist    3.8 <3.8 22.6 >10  Left Radial Anti  Sensory (Base 1st Digit)  32.6C  Wrist    2.7 <2.8 12.2 >10  Left Sup Peroneal Anti Sensory (Ant Lat Mall)  32.6C  12 cm NR  <4.6  >3  Left Sural Anti Sensory (Lat Mall)  32.6C  Calf NR  <4.6  >3  Left Ulnar Anti Sensory (5th Digit)  32.6C  Wrist    3.2 <3.2 11.3 >5   Motor Summary Table   Site NR Onset (ms) Norm Onset (ms) O-P Amp (mV) Norm O-P Amp Site1 Site2 Delta-0 (ms) Dist (cm) Vel (m/s) Norm Vel (m/s)  Left Median Motor (Abd Poll Brev)  32.6C  Wrist    3.6 <4.0 6.3 >5 Elbow Wrist 6.3 34.0 54 >50  Elbow    9.9  5.8         Left Peroneal Motor (Ext Dig Brev)  32.6C  Ankle NR  <6.0  >2.5 B Fib Ankle  0.0  >40  B Fib NR     Poplt B Fib  0.0  >40  Poplt NR            Left Peroneal TA Motor (Tib Ant)  32.6C  Fib Head    4.5 <4.5 3.2 >3 Poplit Fib Head 1.7 10.0 59 >40  Poplit    6.2  3.2         Left Tibial Motor (Abd Hall Brev)  32.6C  Ankle    5.9 <6.0 1.6 >4 Knee Ankle 14.6 46.0 32 >40  Knee    20.5  0.9         Left Ulnar Motor (Abd Dig Minimi)  32.6C  Wrist    3.1 <3.1 7.2 >7 B Elbow  Wrist 4.5 26.0 58 >50  B Elbow    7.6  6.6  A Elbow B Elbow 2.0 10.0 50 >50  A Elbow    9.6  6.1          H Reflex Studies   NR H-Lat (ms) Lat Norm (ms) L-R H-Lat (ms)  Left Tibial (Gastroc)  32.6C     45.17 <35    EMG   Side Muscle Ins Act Fibs Psw Fasc Number Recrt Dur Dur. Amp Amp. Poly Poly. Comment  Left 1stDorInt Nml Nml Nml Nml Nml Nml Nml Nml Nml Nml Nml Nml N/A  Left AntTibialis Nml Nml Nml Nml Nml Nml Nml Nml Nml Nml Nml Nml N/A  Left Gastroc Nml Nml Nml Nml 1- Rapid Some 1+ Some 1+ Some 1+ N/A  Left RectFemoris Nml Nml Nml Nml Nml Nml Nml Nml Nml Nml Nml Nml N/A  Left GluteusMed Nml Nml Nml Nml Nml Nml Nml Nml Nml Nml Nml Nml N/A  Left BicepsFemS Nml Nml Nml Nml 1- Rapid Some 1+ Some 1+ Some 1+ N/A  Left PronatorTeres Nml Nml Nml Nml Nml Nml Nml Nml Nml Nml Nml Nml N/A  Left Ext Indicis Nml Nml Nml Nml Nml Nml Nml Nml Nml Nml Nml Nml N/A  Left Biceps Nml Nml Nml  Nml Nml Nml Nml Nml Nml Nml Nml Nml N/A  Left Triceps Nml Nml Nml Nml Nml Nml Nml Nml Nml Nml Nml Nml N/A  Left Deltoid Nml Nml Nml Nml Nml Nml Nml Nml Nml Nml Nml Nml N/A      Waveforms:

## 2017-05-20 ENCOUNTER — Telehealth: Payer: Self-pay | Admitting: *Deleted

## 2017-05-20 NOTE — Telephone Encounter (Signed)
I called patient and gave him the results.  He is willing to do the MRI but is having knee surgery on Dec. 31 and would like to wait until the end of January.  Is the MRI cervical w, wo or w and wo?

## 2017-05-20 NOTE — Telephone Encounter (Signed)
-----   Message from Glendale Chardonika K Patel, DO sent at 05/19/2017  5:51 PM EST ----- Please call the patient and inform him that there is neuropathy in the leg only. There is no neuropathy in the arms to explain his numbness. I recommend getting an MRI of the cervical spine, if patient is willing. He has severe claustrophobia and will need diazepam as well as open MRI.

## 2017-05-20 NOTE — Telephone Encounter (Signed)
Noted  

## 2017-05-20 NOTE — Telephone Encounter (Signed)
That would be fine.  Let him recover from his knee surgery, then we can proceed with MRI cervical spine wo contrast.   Thanks.

## 2017-05-24 DIAGNOSIS — Z961 Presence of intraocular lens: Secondary | ICD-10-CM | POA: Insufficient documentation

## 2017-05-24 DIAGNOSIS — H348312 Tributary (branch) retinal vein occlusion, right eye, stable: Secondary | ICD-10-CM | POA: Insufficient documentation

## 2017-05-26 NOTE — Pre-Procedure Instructions (Signed)
Genene ChurnJames M Bilyk  05/26/2017      [facility] - [place], [place] - [address] [place] [id number] Phone: [phone] Fax: [phone]    Your procedure is scheduled on Dec. 31  [facility] Admitting at 615 A.M.  Call this number if you have problems the morning of surgery:  [phone]   Remember:  Do not eat food or drink liquids after midnight.  Take these medicines the morning of surgery with A SIP OF WATER azelastine (Astelin) nasal spray, Rhinocort  Nasal spray, Isosorbide mononitrate (Imdur), Metoprolol succinate (Toprol-xl), Nitrostat is needed, tramadol (Ultram) is needed  Stop plavix as directed by your Dr.   Stop taking aspirin, BC's, Goody's, Herbal medications, Fish Oil, Ibuprofen, Advil, Motrin, Aleve,Vitamins   Do not wear jewelry, make-up or nail polish.  Do not wear lotions, powders, or perfumes, or deodorant.  Do not shave 48 hours prior to surgery.  Men may shave face and neck.  Do not bring valuables to the hospital.  [facility] is not responsible for any belongings or valuables.  Contacts, dentures or bridgework may not be worn into surgery.  Leave your suitcase in the car.  After surgery it may be brought to your room.  For patients admitted to the hospital, discharge time will be determined by your treatment team.  Patients discharged the day of surgery will not be allowed to drive home.   Special instructions:  [place] - Preparing for Surgery  Before surgery, you can play an important role.  Because skin is not sterile, your skin needs to be as free of germs as possible.  You can reduce the number of germs on you skin by washing with CHG (chlorahexidine gluconate) soap before surgery.  CHG is an antiseptic cleaner which kills germs and bonds with the skin to continue killing germs even after washing.  Please DO NOT use if you have an allergy to CHG or antibacterial  soaps.  If your skin becomes reddened/irritated stop using the CHG and inform your nurse when you arrive at Short Stay.  Do not shave (including legs and underarms) for at least 48 hours prior to the first CHG shower.  You may shave your face.  Please follow these instructions carefully:   1.  Shower with CHG Soap the night before surgery and the  morning of Surgery.  2.  If you choose to wash your hair, wash your hair first as usual with your  normal shampoo.  3.  After you shampoo, rinse your hair and body thoroughly to remove the  Shampoo.  4.  Use CHG as you would any other liquid soap.  You can apply chg directly  to the skin and wash gently with scrungie or a clean washcloth.  5.  Apply the CHG Soap to your body ONLY FROM THE NECK DOWN.   Do not use on open wounds or open sores.  Avoid contact with your eyes, ears, mouth and genitals (private parts).  Wash genitals (private parts)   with your normal soap.  6.  Wash thoroughly, paying special attention to the area where your surgery will be performed.  7.  Thoroughly rinse your body with warm water from the neck down.  8.  DO NOT shower/wash with your normal soap after using and rinsing off  the CHG Soap.  9.  Pat yourself dry with a clean towel.  10.  Wear clean pajamas.            11.  Place clean sheets on your bed the night of your first shower and do not sleep with pets.  Day of Surgery  Do not apply any lotions/deoderants the morning of surgery.  Please wear clean clothes to the hospital/surgery center.     Please read over the following fact sheets that you were given. Pain Booklet, Coughing and Deep Breathing, MRSA Information and Surgical Site Infection Prevention

## 2017-05-27 ENCOUNTER — Encounter (HOSPITAL_COMMUNITY)
Admission: RE | Admit: 2017-05-27 | Discharge: 2017-05-27 | Disposition: A | Discharge status: Home / Self Care | Payer: Medicare PPO | Source: Ambulatory Visit | Attending: Orthopedic Surgery | Admitting: Orthopedic Surgery

## 2017-05-27 ENCOUNTER — Other Ambulatory Visit: Payer: Self-pay

## 2017-05-27 ENCOUNTER — Encounter (HOSPITAL_COMMUNITY): Payer: Self-pay

## 2017-05-27 DIAGNOSIS — I10 Essential (primary) hypertension: Secondary | ICD-10-CM | POA: Insufficient documentation

## 2017-05-27 DIAGNOSIS — E785 Hyperlipidemia, unspecified: Secondary | ICD-10-CM | POA: Insufficient documentation

## 2017-05-27 DIAGNOSIS — Z01812 Encounter for preprocedural laboratory examination: Secondary | ICD-10-CM | POA: Insufficient documentation

## 2017-05-27 DIAGNOSIS — Z7902 Long term (current) use of antithrombotics/antiplatelets: Secondary | ICD-10-CM | POA: Insufficient documentation

## 2017-05-27 DIAGNOSIS — Z79899 Other long term (current) drug therapy: Secondary | ICD-10-CM | POA: Diagnosis not present

## 2017-05-27 DIAGNOSIS — Z01818 Encounter for other preprocedural examination: Secondary | ICD-10-CM | POA: Insufficient documentation

## 2017-05-27 DIAGNOSIS — Z955 Presence of coronary angioplasty implant and graft: Secondary | ICD-10-CM | POA: Insufficient documentation

## 2017-05-27 DIAGNOSIS — R202 Paresthesia of skin: Secondary | ICD-10-CM | POA: Diagnosis not present

## 2017-05-27 DIAGNOSIS — I2510 Atherosclerotic heart disease of native coronary artery without angina pectoris: Secondary | ICD-10-CM | POA: Diagnosis not present

## 2017-05-27 DIAGNOSIS — Z951 Presence of aortocoronary bypass graft: Secondary | ICD-10-CM | POA: Diagnosis not present

## 2017-05-27 HISTORY — DX: Paresthesia of skin: R20.2

## 2017-05-27 HISTORY — DX: Cardiac arrhythmia, unspecified: I49.9

## 2017-05-27 HISTORY — DX: Unspecified osteoarthritis, unspecified site: M19.90

## 2017-05-27 HISTORY — DX: Personal history of urinary calculi: Z87.442

## 2017-05-27 LAB — COMPREHENSIVE METABOLIC PANEL
ALT: 16 U/L — ABNORMAL LOW (ref 17–63)
AST: 22 U/L (ref 15–41)
Albumin: 3.7 g/dL (ref 3.5–5.0)
Alkaline Phosphatase: 86 U/L (ref 38–126)
Anion gap: 4 — ABNORMAL LOW (ref 5–15)
BUN: 16 mg/dL (ref 6–20)
CO2: 28 mmol/L (ref 22–32)
Calcium: 9.4 mg/dL (ref 8.9–10.3)
Chloride: 106 mmol/L (ref 101–111)
Creatinine, Ser: 0.84 mg/dL (ref 0.61–1.24)
GFR calc Af Amer: >60 mL/min (ref >60)
GFR calc non Af Amer: >60 mL/min (ref >60)
Glucose, Bld: 115 mg/dL — ABNORMAL HIGH (ref 65–99)
Potassium: 4.3 mmol/L (ref 3.5–5.1)
Sodium: 138 mmol/L (ref 135–145)
Total Bilirubin: 0.9 mg/dL (ref 0.3–1.2)
Total Protein: 7.5 g/dL (ref 6.5–8.1)

## 2017-05-27 LAB — CBC WITH DIFFERENTIAL/PLATELET
Basophils Absolute: 0 10*3/uL (ref 0.0–0.1)
Basophils Relative: 0 %
Eosinophils Absolute: 0.1 10*3/uL (ref 0.0–0.7)
Eosinophils Relative: 2 %
HCT: 42.6 % (ref 39.0–52.0)
Hemoglobin: 14.3 g/dL (ref 13.0–17.0)
Lymphocytes Relative: 37 %
Lymphs Abs: 2 10*3/uL (ref 0.7–4.0)
MCH: 29.8 pg (ref 26.0–34.0)
MCHC: 33.6 g/dL (ref 30.0–36.0)
MCV: 88.8 fL (ref 78.0–100.0)
Monocytes Absolute: 0.4 10*3/uL (ref 0.1–1.0)
Monocytes Relative: 7 %
Neutro Abs: 3 10*3/uL (ref 1.7–7.7)
Neutrophils Relative %: 54 %
Platelets: 199 10*3/uL (ref 150–400)
RBC: 4.8 MIL/uL (ref 4.22–5.81)
RDW: 13.9 % (ref 11.5–15.5)
WBC: 5.5 10*3/uL (ref 4.0–10.5)

## 2017-05-27 LAB — SURGICAL PCR SCREEN
MRSA, PCR: NEGATIVE
Staphylococcus aureus: NEGATIVE

## 2017-05-27 NOTE — Progress Notes (Addendum)
PCP is Dr. Geoffry Paradiseichard Aronson Cardiologist is Dr. Bryan Lemmaavid Harding Dr Helyn NumbersPetal [facility]  States last plavix dose will be 05-30-17 Denies chest pain or fever. Reports a cough, but is taking antibiotics now. 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 They are just watching it now. He had a follow up 05-24-17 after having cataract surgery.

## 2017-05-30 ENCOUNTER — Encounter (HOSPITAL_COMMUNITY): Payer: Self-pay | Admitting: Emergency Medicine

## 2017-05-30 NOTE — Progress Notes (Signed)
Anesthesia Chart Review:  Pt is an 83 year old male scheduled for R total knee arthroplasty on 06/06/2017 with Dannielle HuhSteve Lucey, M.D.  - PCP is Geoffry Paradiseichard Aronson, MD - Cardiologist is Bryan Lemmaavid Harding, MD who cleared pt for surgery at last office visit 04/25/17 - Neurologist is Nita Sickleonika Patel, DO who sees pt for paresthesias. Dr. Allena KatzPatel is aware of upcoming surgery. Last office visit 05/02/17  PMH includes: CAD (s/p CABG x3 1997; BMS to PDA 2008), HTN, dyslipidemia. Never smoker. BMI 24.  Medications include: Plavix, Imdur, lisinopril, metoprolol. Last dose plavix 05/29/17; I confirmed with pt by telephone.   BP (!) 160/66   Pulse 73   Temp 36.6 C   Resp 18   Ht 6\' 3"  (1.905 m)   Wt 190 lb 12.8 oz (86.5 kg)   SpO2 98%   BMI 23.85 kg/m   Preoperative labs reviewed.    EKG 08/13/16: NSR. Nonspecific ST abnormality.   Holter monitor 03/23/17:   Mostly sinus rhythm/sinus bradycardia sinus arrhythmia. First-degree AV block noted.  Rare PVCs and PACs noted.  PVCs with couplets and ventricular bigeminy noted.  Rare PACs with 2 pairs and 2 short PAT runs (longest appears to be 5 beats)  No symptoms noted on diary. - Relatively normal 48-hour monitor.  Very infrequent ectopy.  The patient may have symptoms from bigeminy or short PAT runs.  Nuclear stress test 03/17/17:   The left ventricular ejection fraction is mildly decreased (45-54%).  Nuclear stress EF: 54%.  No T wave inversion was noted during stress.  There was no ST segment deviation noted during stress.  TID 1.34 - No significant reversible ischemia. Increased TID ratio 1.34. LVEF 54% with normal wall motion. This is a low risk study.  Cardiac cath 08/08/15:  1. Ost LM to LM lesion, 80% stenosed. LM-Ostial LAD lesion, 70-80% stenosed. Prox LAD to Mid LAD lesion, 100% stenosed after small D1. 2. Ost 2nd Mrg lesion, 100% stenosed. Very small caliber AV Groove Circumflex remains after OM1. 3. Prox RCA lesion, 20% stenosed. Dist  RCA lesion, 20% stenosed. Widely patent PDA stent 4. SVG-OM2 was injected is large, and is anatomically normal. 5. SVG-D2 was injected is large, and is anatomically normal. Antegrade flow fills a moderate caliber diagonal vessel with minimal disease. Retrograde flow fills the native LAD with TIMI 3 flow. 6. LIMA-LAD was injected is small and diffusely Atretic. It almost does not reach the LAD, and is noted to have significant competetive flow from the Retrograde SVG-Diag flow. 7. The left ventricular systolic function is normal. 8. Systemic Hypertension with Mildly elevated LVEDP  If no changes, I anticipate pt can proceed with surgery as scheduled.   Rica Mastngela Jaxsen Bernhart, FNP-BC [facility] Short Stay Surgical Center/Anesthesiology Phone: [phone] 05/30/2017 10:46 AM

## 2017-06-03 MED ORDER — BUPIVACAINE LIPOSOME 1.3 % IJ SUSP
20.0000 mL | INTRAMUSCULAR | Status: DC
  Filled 2017-06-03: qty 20

## 2017-06-03 MED ORDER — ACETAMINOPHEN 500 MG PO TABS: 1000.0000 mg | ORAL_TABLET | Freq: Once | ORAL | Status: DC

## 2017-06-03 MED ORDER — GABAPENTIN 300 MG PO CAPS: 300.0000 mg | ORAL_CAPSULE | Freq: Once | ORAL | Status: DC

## 2017-06-03 MED ORDER — DEXAMETHASONE SODIUM PHOSPHATE 10 MG/ML IJ SOLN
8.0000 mg | INTRAMUSCULAR | Status: DC
Start: 2017-06-06 — End: 2017-06-05

## 2017-06-03 MED ORDER — TRANEXAMIC ACID 1000 MG/10ML IV SOLN
1000.0000 mg | INTRAVENOUS | Status: DC
Start: 2017-06-06 — End: 2017-06-05
  Filled 2017-06-03: qty 10

## 2017-06-03 MED ORDER — CLINDAMYCIN PHOSPHATE 900 MG/50ML IV SOLN: 900.0000 mg | INTRAVENOUS | Status: DC

## 2017-06-06 ENCOUNTER — Ambulatory Visit (HOSPITAL_COMMUNITY): Admission: RE | Admit: 2017-06-06 | Payer: Medicare PPO | Source: Ambulatory Visit | Admitting: Orthopedic Surgery

## 2017-06-06 ENCOUNTER — Encounter (HOSPITAL_COMMUNITY): Admission: RE | Payer: Self-pay | Source: Ambulatory Visit

## 2017-06-06 SURGERY — ARTHROPLASTY, KNEE, TOTAL
Anesthesia: Spinal | Laterality: Right

## 2017-06-14 ENCOUNTER — Other Ambulatory Visit: Payer: Self-pay | Admitting: *Deleted

## 2017-06-14 DIAGNOSIS — R200 Anesthesia of skin: Secondary | ICD-10-CM

## 2017-06-14 MED ORDER — DIAZEPAM 5 MG PO TABS: ORAL_TABLET | ORAL | 0 refills | Status: DC

## 2017-06-15 ENCOUNTER — Telehealth: Payer: Self-pay | Admitting: Neurology

## 2017-06-15 NOTE — Telephone Encounter (Signed)
Patient has some questions about the MRI please call

## 2017-06-15 NOTE — Telephone Encounter (Signed)
Patient informed that his MRI has been sent to [facility] for an [facility] and his valium Rx was faxed in.

## 2017-06-16 ENCOUNTER — Telehealth: Payer: Self-pay | Admitting: Neurology

## 2017-06-16 NOTE — Telephone Encounter (Signed)
PA needed please.  Thanks!

## 2017-06-16 NOTE — Telephone Encounter (Signed)
Marisue IvanLiz from [facility] called in regards to pt having an MRI and it needs a prior auth CB# [phone]

## 2017-06-23 ENCOUNTER — Telehealth: Payer: Self-pay | Admitting: Neurology

## 2017-06-23 NOTE — Telephone Encounter (Signed)
I attempted to contact patient via phone today regarding the results of MRI cervical spine, however there was no answer so a message was left for the patient to return my call.   MRI cervical spine without contrast 06/21/2017 performed at [facility]: Severe degenerative disc disease and facet arthrosis at C3-4 with right sided periarticular erosions and joint effusion consistent with the degree of inflammation or instability. There is moderately severe central canal and right greater than left foraminal stenosis. There is focal myelomalacia at this level. Degenerative disc disease at C5-6 and C6-7 with mild central and moderate foraminal stenosis.  Findings consistent with a multinodular goiter.  Lobulated T2 hyperintense mass in the posterior left chest of uncertain etiology. This may represent a neurogenic tumor but is incompletely evaluated. Would consider MRI of the thoracic spine with and without contrast further evaluation.  ---------------------------------------------------------------------------------------------------- If patient calls back while I am out of the office, please let him know the following: 1.  There is severe multilevel arthritis in his cervical spine most notably at C3-4 with there is impingement of the spinal cord, causing scarring.  This is most likely causing his numbness. Please have him contact [facility] to make a copy of the CD so that images can be reviewed. I recommend ASAP referral to neurosurgery for spinal canal stenosis.  2.  There is a multinodular thyroid goiter, which he can follow up with his primary care doctor. Please fax MRI report and this telephone note to his PCP.  3.  There is also an area of abnormality in the left chest.  It is unclear what this represents an needs to be further evaluated with MRI thoracic spine with and without contrast. Please remind patient to request images on CD so I can review personally.  Marygrace Sandoval K. Allena KatzPatel, DO

## 2017-06-23 NOTE — Telephone Encounter (Signed)
Correct phone number for patient.

## 2017-06-23 NOTE — Telephone Encounter (Signed)
Patient would like the results of the MRI  °

## 2017-06-27 NOTE — Telephone Encounter (Signed)
All information given to patient and all referral sent.

## 2017-07-01 ENCOUNTER — Other Ambulatory Visit: Payer: Self-pay | Admitting: Neurosurgery

## 2017-07-04 ENCOUNTER — Telehealth: Payer: Self-pay | Admitting: Cardiology

## 2017-07-04 NOTE — Telephone Encounter (Signed)
Request for surgical clearance:  1. What type of surgery is being performed?  Posterior surgical fusion   2. When is this surgery scheduled?  07/14/17   3. Are there any medications that need to be held prior to surgery and how long? Plavix    4. Name of physician performing surgery? Dr. Conchita ParisNundkumar   5. What is your office phone and fax number?  Phone [phone]  Fax- [phone] Alison StallingATTN Nikki at [facility]

## 2017-07-05 NOTE — Telephone Encounter (Signed)
   Primary Cardiologist: Bryan Lemmaavid Harding, MD  Chart reviewed as part of pre-operative protocol coverage. Patient was contacted 07/05/2017 in reference to pre-operative risk assessment for pending surgery as outlined below.  Genene ChurnJames M Eline was last seen on 04/25/17  by Dr. Herbie BaltimoreHarding. He was cleared for knee surgery at that time with instruction to  stopping Plavix 5-7 days pre-op.  I have left voice mail to call us back between pre-op hours (1:30-5pm).   Manns ChoiceBhavinkumar Sahana Boyland, [place] 07/05/2017, 12:51 PM

## 2017-07-06 NOTE — Telephone Encounter (Signed)
Pt has been cleared for surgery. I will fax over clearance to surgeons office today.

## 2017-07-06 NOTE — Telephone Encounter (Signed)
   Primary Cardiologist: Bryan Lemmaavid Harding, MD  Chart reviewed as part of pre-operative protocol coverage. Given past medical history and time since last visit, based on ACC/AHA guidelines, Daniel [name] would be at acceptable risk for the planned procedure without further cardiovascular testing.   Daniel [name] was last seen on 04/25/17  by Dr. Herbie BaltimoreHarding. He was cleared for knee surgery at that time with instruction to  stopping Plavix 5-7 days pre-op. I called him today and confirmed no new symptoms and his functional capacity hasn't changed.  I will route this recommendation to the requesting party via Epic fax function and remove from pre-op pool.  Please call with questions.  Daniel RutherfordAngela Nicole Latravis Grine, PA 07/06/2017, 1:51 PM

## 2017-07-12 ENCOUNTER — Other Ambulatory Visit: Payer: Self-pay

## 2017-07-12 ENCOUNTER — Encounter (HOSPITAL_COMMUNITY)
Admission: RE | Admit: 2017-07-12 | Discharge: 2017-07-12 | Disposition: A | Discharge status: Home / Self Care | Payer: Medicare PPO | Source: Ambulatory Visit | Attending: Neurosurgery | Admitting: Neurosurgery

## 2017-07-12 ENCOUNTER — Encounter (HOSPITAL_COMMUNITY): Payer: Self-pay

## 2017-07-12 HISTORY — DX: Adverse effect of unspecified anesthetic, initial encounter: T41.45XA

## 2017-07-12 HISTORY — DX: Localized swelling, mass and lump, trunk: R22.2

## 2017-07-12 HISTORY — DX: Other complications of anesthesia, initial encounter: T88.59XA

## 2017-07-12 HISTORY — DX: Failed or difficult intubation, initial encounter: T88.4XXA

## 2017-07-12 LAB — CBC
HCT: 43.3 % (ref 39.0–52.0)
Hemoglobin: 14.3 g/dL (ref 13.0–17.0)
MCH: 29.6 pg (ref 26.0–34.0)
MCHC: 33 g/dL (ref 30.0–36.0)
MCV: 89.6 fL (ref 78.0–100.0)
Platelets: 249 10*3/uL (ref 150–400)
RBC: 4.83 MIL/uL (ref 4.22–5.81)
RDW: 13.7 % (ref 11.5–15.5)
WBC: 6.1 10*3/uL (ref 4.0–10.5)

## 2017-07-12 LAB — BASIC METABOLIC PANEL
Anion gap: 12 (ref 5–15)
BUN: 16 mg/dL (ref 6–20)
CO2: 26 mmol/L (ref 22–32)
Calcium: 9.6 mg/dL (ref 8.9–10.3)
Chloride: 102 mmol/L (ref 101–111)
Creatinine, Ser: 0.89 mg/dL (ref 0.61–1.24)
GFR calc Af Amer: >60 mL/min (ref >60)
GFR calc non Af Amer: >60 mL/min (ref >60)
Glucose, Bld: 104 mg/dL — ABNORMAL HIGH (ref 65–99)
Potassium: 4.3 mmol/L (ref 3.5–5.1)
Sodium: 140 mmol/L (ref 135–145)

## 2017-07-12 LAB — SURGICAL PCR SCREEN
MRSA, PCR: NEGATIVE
Staphylococcus aureus: NEGATIVE

## 2017-07-12 LAB — ABO/RH: ABO/RH(D): O POS

## 2017-07-12 NOTE — Pre-Procedure Instructions (Addendum)
Genene ChurnJames M Dixson  07/12/2017      [facility] - [place], [place] - [address] [place] [id number] Phone: [phone] Fax: [phone]    Your procedure is scheduled on  Thursday 07/14/17  [facility] Ad[facility] at 1045 A.M.  Call this number if you have problems the morning of surgery:  [phone]   Remember:  Do not eat food or drink liquids after midnight.  Take these medicines the morning of surgery with A SIP OF WATER - NASAL SPRAY, ISOSORBIDE MONONITRATE (IMDUR), METOPROLOL (TOPROLXL), TAMSULOSIN(FLOMAX), TRAMADOL IF NEEDED  7 days prior to surgery STOP taking any Aspirin(unless otherwise instructed by your surgeon), Aleve, Naproxen, Ibuprofen, Motrin, Advil, Goody's, BC's, all herbal medications, fish oil, and all vitamins STOP PLAVIX AS DIRECTED   Do not wear jewelry, make-up or nail polish.  Do not wear lotions, powders, or perfumes, or deodorant.  Do not shave 48 hours prior to surgery.  Men may shave face and neck.  Do not bring valuables to the hospital.  [facility] is not responsible for any belongings or valuables.  Contacts, dentures or bridgework may not be worn into surgery.  Leave your suitcase in the car.  After surgery it may be brought to your room.  For patients admitted to the hospital, discharge time will be determined by your treatment team.  Patients discharged the day of surgery will not be allowed to drive home.   Name and phone number of your driver:    Special instructions:  [place] - Preparing for Surgery  Before surgery, you can play an important role.  Because skin is not sterile, your skin needs to be as free of germs as possible.  You can reduce the number of germs on you skin by washing with CHG (chlorahexidine gluconate) soap before surgery.  CHG is an antiseptic cleaner which kills germs and bonds with the skin to continue killing germs even after  washing.  Please DO NOT use if you have an allergy to CHG or antibacterial soaps.  If your skin becomes reddened/irritated stop using the CHG and inform your nurse when you arrive at Short Stay.  Do not shave (including legs and underarms) for at least 48 hours prior to the first CHG shower.  You may shave your face.  Please follow these instructions carefully:   1.  Shower with CHG Soap the night before surgery and the                                morning of Surgery.  2.  If you choose to wash your hair, wash your hair first as usual with your       normal shampoo.  3.  After you shampoo, rinse your hair and body thoroughly to remove the                      Shampoo.  4.  Use CHG as you would any other liquid soap.  You can apply chg directly       to the skin and wash gently with scrungie or a clean washcloth.  5.  Apply the CHG Soap to your body ONLY FROM THE NECK DOWN.        Do not use on open wounds or open sores.  Avoid contact with your eyes,       ears,  mouth and genitals (private parts).  Wash genitals (private parts)       with your normal soap.  6.  Wash thoroughly, paying special attention to the area where your surgery        will be performed.  7.  Thoroughly rinse your body with warm water from the neck down.  8.  DO NOT shower/wash with your normal soap after using and rinsing off       the CHG Soap.  9.  Pat yourself dry with a clean towel.            10.  Wear clean pajamas.            11.  Place clean sheets on your bed the night of your first shower and do not        sleep with pets.  Day of Surgery  Do not apply any lotions/deoderants the morning of surgery.  Please wear clean clothes to the hospital/surgery center.    Please read over the following fact sheets that you were given. MRSA Information and Surgical Site Infection Prevention

## 2017-07-13 LAB — TYPE AND SCREEN
ABO/RH(D): O POS
Antibody Screen: NEGATIVE

## 2017-07-13 NOTE — Progress Notes (Signed)
Anesthesia Chart Review:  Pt is an [age] year old male scheduled for C3-4 laminectomy with lateral mass fusion on 07/14/2017 with Lisbeth RenshawNeelesh Nundkumar, MD  - PCP is Geoffry Paradiseichard Aronson, MD - Cardiologist is Bryan Lemmaavid Harding, MD. Last office visit 04/25/17. Pt cleared for surgery by Micah FlesherAngela Duke, PA on 07/04/17 - Neurologist is Nita Sickleonika Patel, DO who sees pt for paresthesias. Last office visit 05/02/17  PMH includes: CAD (s/p CABG x3 1997; BMS to PDA 2008), HTN, dyslipidemia. Never smoker. BMI 24.  Anesthesia history: difficult intubation due to "narrow airway" with CABG in 1997.  Anesthesia records from 10/24/08 lumbar surgery document glidescope used to insert 7.5 ETT, no difficulties documented.  Records on paper chart for review.   Medications include: Plavix, Imdur, lisinopril, metoprolol. Last dose plavix 07/07/17.   BP (!) 166/91   Pulse 72   Temp 36.6 C   Resp 20   Ht 6\' 3"  (1.905 m)   Wt 190 lb 6.4 oz (86.4 kg)   SpO2 96%   BMI 23.80 kg/m    Preoperative labs reviewed.    EKG 08/13/16: NSR. Nonspecific ST abnormality.   Holter monitor 03/23/17:   Mostly sinus rhythm/sinus bradycardia sinus arrhythmia. First-degree AV block noted.  Rare PVCs and PACs noted.  PVCs with couplets and ventricular bigeminy noted.  Rare PACs with 2 pairs and 2 short PAT runs (longest appears to be 5 beats)  No symptoms noted on diary. - Relatively normal 48-hour monitor. Very infrequent ectopy. The patient may have symptoms from bigeminy or short PAT runs.  Nuclear stress test 03/17/17:   The left ventricular ejection fraction is mildly decreased (45-54%).  Nuclear stress EF: 54%.  No T wave inversion was noted during stress.  There was no ST segment deviation noted during stress.  TID 1.34 - No significant reversible ischemia. Increased TID ratio 1.34. LVEF 54% with normal wall motion. This is a low risk study.  Cardiac cath 08/08/15:  1. Ost LM to LM lesion, 80% stenosed. LM-Ostial LAD  lesion, 70-80% stenosed. Prox LAD to Mid LAD lesion, 100% stenosed after small D1. 2. Ost 2nd Mrg lesion, 100% stenosed. Very small caliber AV Groove Circumflex remains after OM1. 3. Prox RCA lesion, 20% stenosed. Dist RCA lesion, 20% stenosed. Widely patent PDA stent 4. SVG-OM2 was injected is large, and is anatomically normal. 5. SVG-D2 was injected is large, and is anatomically normal. Antegrade flow fills a moderate caliber diagonal vessel with minimal disease. Retrograde flow fills the native LAD with TIMI 3 flow. 6. LIMA-LAD was injected is small and diffusely Atretic. It almost does not reach the LAD, and is noted to have significant competetive flow from the Retrograde SVG-Diag flow. 7. The left ventricular systolic function is normal. 8. Systemic Hypertension with Mildly elevated LVEDP  If no changes, I anticipate pt can proceed with surgery as scheduled.   Rica Mastngela Jermani Eberlein, FNP-BC [facility] Short Stay Surgical Center/Anesthesiology Phone: [phone] 07/13/2017 2:04 PM

## 2017-07-14 ENCOUNTER — Inpatient Hospital Stay (HOSPITAL_COMMUNITY)
Admission: RE | Admit: 2017-07-14 | Discharge: 2017-07-15 | DRG: 472 | Disposition: A | Discharge status: Home / Self Care | Payer: Medicare PPO | Source: Ambulatory Visit | Attending: Neurosurgery | Admitting: Neurosurgery

## 2017-07-14 ENCOUNTER — Inpatient Hospital Stay (HOSPITAL_COMMUNITY): Payer: Medicare PPO | Admitting: Anesthesiology

## 2017-07-14 ENCOUNTER — Inpatient Hospital Stay (HOSPITAL_COMMUNITY)
Admission: RE | Disposition: A | Discharge status: Home / Self Care | Payer: Self-pay | Source: Ambulatory Visit | Attending: Neurosurgery

## 2017-07-14 ENCOUNTER — Inpatient Hospital Stay (HOSPITAL_COMMUNITY): Payer: Medicare PPO

## 2017-07-14 ENCOUNTER — Encounter (HOSPITAL_COMMUNITY): Payer: Self-pay

## 2017-07-14 ENCOUNTER — Inpatient Hospital Stay (HOSPITAL_COMMUNITY): Payer: Medicare PPO | Admitting: Emergency Medicine

## 2017-07-14 ENCOUNTER — Other Ambulatory Visit: Payer: Self-pay

## 2017-07-14 DIAGNOSIS — Z882 Allergy status to sulfonamides status: Secondary | ICD-10-CM

## 2017-07-14 DIAGNOSIS — Z9841 Cataract extraction status, right eye: Secondary | ICD-10-CM | POA: Diagnosis not present

## 2017-07-14 DIAGNOSIS — M4802 Spinal stenosis, cervical region: Secondary | ICD-10-CM | POA: Diagnosis present

## 2017-07-14 DIAGNOSIS — G992 Myelopathy in diseases classified elsewhere: Secondary | ICD-10-CM | POA: Diagnosis present

## 2017-07-14 DIAGNOSIS — Z79891 Long term (current) use of opiate analgesic: Secondary | ICD-10-CM | POA: Diagnosis not present

## 2017-07-14 DIAGNOSIS — Z8249 Family history of ischemic heart disease and other diseases of the circulatory system: Secondary | ICD-10-CM | POA: Diagnosis not present

## 2017-07-14 DIAGNOSIS — Z955 Presence of coronary angioplasty implant and graft: Secondary | ICD-10-CM

## 2017-07-14 DIAGNOSIS — Z9842 Cataract extraction status, left eye: Secondary | ICD-10-CM | POA: Diagnosis not present

## 2017-07-14 DIAGNOSIS — Z880 Allergy status to penicillin: Secondary | ICD-10-CM | POA: Diagnosis not present

## 2017-07-14 DIAGNOSIS — Z961 Presence of intraocular lens: Secondary | ICD-10-CM | POA: Diagnosis present

## 2017-07-14 DIAGNOSIS — G8929 Other chronic pain: Secondary | ICD-10-CM | POA: Diagnosis present

## 2017-07-14 DIAGNOSIS — I25810 Atherosclerosis of coronary artery bypass graft(s) without angina pectoris: Secondary | ICD-10-CM | POA: Diagnosis present

## 2017-07-14 DIAGNOSIS — Z419 Encounter for procedure for purposes other than remedying health state, unspecified: Secondary | ICD-10-CM

## 2017-07-14 DIAGNOSIS — M4804 Spinal stenosis, thoracic region: Secondary | ICD-10-CM | POA: Diagnosis present

## 2017-07-14 DIAGNOSIS — Z7902 Long term (current) use of antithrombotics/antiplatelets: Secondary | ICD-10-CM | POA: Diagnosis not present

## 2017-07-14 DIAGNOSIS — Z888 Allergy status to other drugs, medicaments and biological substances status: Secondary | ICD-10-CM | POA: Diagnosis not present

## 2017-07-14 DIAGNOSIS — M4050 Lordosis, unspecified, site unspecified: Secondary | ICD-10-CM | POA: Diagnosis present

## 2017-07-14 DIAGNOSIS — Z951 Presence of aortocoronary bypass graft: Secondary | ICD-10-CM

## 2017-07-14 DIAGNOSIS — Z87442 Personal history of urinary calculi: Secondary | ICD-10-CM

## 2017-07-14 DIAGNOSIS — I10 Essential (primary) hypertension: Secondary | ICD-10-CM | POA: Diagnosis present

## 2017-07-14 DIAGNOSIS — Z8261 Family history of arthritis: Secondary | ICD-10-CM | POA: Diagnosis not present

## 2017-07-14 DIAGNOSIS — Z7951 Long term (current) use of inhaled steroids: Secondary | ICD-10-CM

## 2017-07-14 DIAGNOSIS — M170 Bilateral primary osteoarthritis of knee: Secondary | ICD-10-CM | POA: Diagnosis present

## 2017-07-14 HISTORY — PX: POSTERIOR CERVICAL FUSION/FORAMINOTOMY: SHX5038

## 2017-07-14 HISTORY — DX: Myelopathy in diseases classified elsewhere: G99.2

## 2017-07-14 HISTORY — DX: Spinal stenosis, cervical region: M48.02

## 2017-07-14 SURGERY — POSTERIOR CERVICAL FUSION/FORAMINOTOMY LEVEL 1
Anesthesia: General

## 2017-07-14 MED ORDER — FENTANYL CITRATE (PF) 100 MCG/2ML IJ SOLN
25.0000 ug | INTRAMUSCULAR | Status: DC | PRN
  Administered 2017-07-14 (×3): 50 ug via INTRAVENOUS

## 2017-07-14 MED ORDER — LIDOCAINE HCL (CARDIAC) 20 MG/ML IV SOLN
INTRAVENOUS | Status: DC | PRN
  Administered 2017-07-14: 25 mg via INTRAVENOUS

## 2017-07-14 MED ORDER — VITAMIN B-12 1000 MCG PO TABS
1000.0000 ug | ORAL_TABLET | Freq: Two times a day (BID) | ORAL | Status: DC
  Administered 2017-07-14 – 2017-07-15 (×2): 1000 ug via ORAL
  Filled 2017-07-14 (×3): qty 1

## 2017-07-14 MED ORDER — METHOCARBAMOL 500 MG PO TABS
ORAL_TABLET | ORAL | Status: AC
  Filled 2017-07-14: qty 1

## 2017-07-14 MED ORDER — ONDANSETRON HCL 4 MG PO TABS: 4.0000 mg | ORAL_TABLET | Freq: Four times a day (QID) | ORAL | Status: DC | PRN

## 2017-07-14 MED ORDER — SUGAMMADEX SODIUM 200 MG/2ML IV SOLN
INTRAVENOUS | Status: DC | PRN
  Administered 2017-07-14: 180 mg via INTRAVENOUS

## 2017-07-14 MED ORDER — METOPROLOL SUCCINATE ER 50 MG PO TB24
75.0000 mg | ORAL_TABLET | Freq: Every day | ORAL | Status: DC
  Administered 2017-07-15: 75 mg via ORAL
  Filled 2017-07-14: qty 1

## 2017-07-14 MED ORDER — OXYCODONE HCL 5 MG PO TABS
10.0000 mg | ORAL_TABLET | ORAL | Status: DC | PRN
  Administered 2017-07-14 – 2017-07-15 (×3): 10 mg via ORAL
  Filled 2017-07-14 (×2): qty 2

## 2017-07-14 MED ORDER — POLYETHYLENE GLYCOL 3350 17 G PO PACK: 17.0000 g | PACK | Freq: Every day | ORAL | Status: DC | PRN

## 2017-07-14 MED ORDER — DEXTROSE 5 % IV SOLN: 500.0000 mg | Freq: Four times a day (QID) | INTRAVENOUS | Status: DC | PRN

## 2017-07-14 MED ORDER — VITAMIN D 1000 UNITS PO TABS
1000.0000 [IU] | ORAL_TABLET | Freq: Every day | ORAL | Status: DC
  Administered 2017-07-14 – 2017-07-15 (×2): 1000 [IU] via ORAL
  Filled 2017-07-14 (×2): qty 1

## 2017-07-14 MED ORDER — KETOROLAC TROMETHAMINE 15 MG/ML IJ SOLN
7.5000 mg | Freq: Four times a day (QID) | INTRAMUSCULAR | Status: DC
  Administered 2017-07-14 – 2017-07-15 (×3): 7.5 mg via INTRAVENOUS
  Filled 2017-07-14 (×3): qty 1

## 2017-07-14 MED ORDER — SODIUM CHLORIDE 0.9% FLUSH: 3.0000 mL | INTRAVENOUS | Status: DC | PRN

## 2017-07-14 MED ORDER — FENTANYL CITRATE (PF) 100 MCG/2ML IJ SOLN
25.0000 ug | INTRAMUSCULAR | Status: DC | PRN
  Administered 2017-07-14: 50 ug via INTRAVENOUS

## 2017-07-14 MED ORDER — ACETAMINOPHEN 10 MG/ML IV SOLN
1000.0000 mg | Freq: Once | INTRAVENOUS | Status: AC
  Administered 2017-07-14: 1000 mg via INTRAVENOUS

## 2017-07-14 MED ORDER — FENTANYL CITRATE (PF) 100 MCG/2ML IJ SOLN
INTRAMUSCULAR | Status: DC | PRN
  Administered 2017-07-14 (×3): 50 ug via INTRAVENOUS

## 2017-07-14 MED ORDER — OXYCODONE HCL 5 MG PO TABS
ORAL_TABLET | ORAL | Status: AC
  Filled 2017-07-14: qty 2

## 2017-07-14 MED ORDER — ZOLPIDEM TARTRATE 5 MG PO TABS: 5.0000 mg | ORAL_TABLET | Freq: Every evening | ORAL | Status: DC | PRN

## 2017-07-14 MED ORDER — ISOSORBIDE MONONITRATE ER 30 MG PO TB24
30.0000 mg | ORAL_TABLET | Freq: Every day | ORAL | Status: DC
  Administered 2017-07-15: 30 mg via ORAL
  Filled 2017-07-14: qty 1

## 2017-07-14 MED ORDER — BACITRACIN ZINC 500 UNIT/GM EX OINT
TOPICAL_OINTMENT | CUTANEOUS | Status: AC
  Filled 2017-07-14: qty 28.35

## 2017-07-14 MED ORDER — ARTIFICIAL TEARS OPHTHALMIC OINT
TOPICAL_OINTMENT | OPHTHALMIC | Status: AC
  Filled 2017-07-14: qty 3.5

## 2017-07-14 MED ORDER — CHLORHEXIDINE GLUCONATE CLOTH 2 % EX PADS
6.0000 | MEDICATED_PAD | Freq: Once | CUTANEOUS | Status: DC
  Administered 2017-07-15: 6 via TOPICAL

## 2017-07-14 MED ORDER — ACETAMINOPHEN 325 MG PO TABS: 650.0000 mg | ORAL_TABLET | ORAL | Status: DC | PRN

## 2017-07-14 MED ORDER — ACETAMINOPHEN 650 MG RE SUPP: 650.0000 mg | RECTAL | Status: DC | PRN

## 2017-07-14 MED ORDER — SODIUM CHLORIDE 0.9 % IV SOLN
250.0000 mL | INTRAVENOUS | Status: DC
Start: 2017-07-14 — End: 2017-07-15

## 2017-07-14 MED ORDER — OXYCODONE HCL 5 MG PO TABS
ORAL_TABLET | ORAL | Status: AC
  Filled 2017-07-14: qty 1

## 2017-07-14 MED ORDER — DOCUSATE SODIUM 100 MG PO CAPS
200.0000 mg | ORAL_CAPSULE | Freq: Every day | ORAL | Status: DC | PRN
Start: 2017-07-14 — End: 2017-07-15

## 2017-07-14 MED ORDER — BISACODYL 10 MG RE SUPP
10.0000 mg | Freq: Every day | RECTAL | Status: DC | PRN
Start: 2017-07-14 — End: 2017-07-15

## 2017-07-14 MED ORDER — LISINOPRIL 10 MG PO TABS
5.0000 mg | ORAL_TABLET | Freq: Every day | ORAL | Status: DC
  Administered 2017-07-15: 5 mg via ORAL
  Filled 2017-07-14: qty 1

## 2017-07-14 MED ORDER — FLEET ENEMA 7-19 GM/118ML RE ENEM: 1.0000 | ENEMA | Freq: Once | RECTAL | Status: DC | PRN

## 2017-07-14 MED ORDER — HEMOSTATIC AGENTS (NO CHARGE) OPTIME
TOPICAL | Status: DC | PRN
  Administered 2017-07-14: 1 via TOPICAL

## 2017-07-14 MED ORDER — ONDANSETRON HCL 4 MG/2ML IJ SOLN: 4.0000 mg | Freq: Once | INTRAMUSCULAR | Status: DC | PRN

## 2017-07-14 MED ORDER — VANCOMYCIN HCL IN DEXTROSE 1-5 GM/200ML-% IV SOLN
1000.0000 mg | INTRAVENOUS | Status: AC
  Administered 2017-07-14: 1000 mg via INTRAVENOUS
  Filled 2017-07-14: qty 200

## 2017-07-14 MED ORDER — METHOCARBAMOL 500 MG PO TABS
500.0000 mg | ORAL_TABLET | Freq: Four times a day (QID) | ORAL | Status: DC | PRN
  Administered 2017-07-14 – 2017-07-15 (×3): 500 mg via ORAL
  Filled 2017-07-14 (×2): qty 1

## 2017-07-14 MED ORDER — LACTATED RINGERS IV SOLN
INTRAVENOUS | Status: DC
  Administered 2017-07-14 (×3): via INTRAVENOUS

## 2017-07-14 MED ORDER — METOPROLOL SUCCINATE ER 50 MG PO TB24
50.0000 mg | ORAL_TABLET | Freq: Every evening | ORAL | Status: DC
  Administered 2017-07-14: 50 mg via ORAL
  Filled 2017-07-14: qty 1

## 2017-07-14 MED ORDER — MELATONIN 3 MG PO TABS: 4.5000 mg | ORAL_TABLET | Freq: Every evening | ORAL | Status: DC | PRN

## 2017-07-14 MED ORDER — ROCURONIUM BROMIDE 10 MG/ML (PF) SYRINGE
PREFILLED_SYRINGE | INTRAVENOUS | Status: AC
  Filled 2017-07-14: qty 5

## 2017-07-14 MED ORDER — VANCOMYCIN HCL IN DEXTROSE 1-5 GM/200ML-% IV SOLN
1000.0000 mg | Freq: Two times a day (BID) | INTRAVENOUS | Status: AC
  Administered 2017-07-15: 1000 mg via INTRAVENOUS
  Filled 2017-07-14: qty 200

## 2017-07-14 MED ORDER — PHENOL 1.4 % MT LIQD: 1.0000 | OROMUCOSAL | Status: DC | PRN

## 2017-07-14 MED ORDER — PANTOPRAZOLE SODIUM 40 MG IV SOLR
40.0000 mg | Freq: Every day | INTRAVENOUS | Status: DC
Start: 2017-07-14 — End: 2017-07-15
  Administered 2017-07-14: 40 mg via INTRAVENOUS
  Filled 2017-07-14: qty 40

## 2017-07-14 MED ORDER — CLONIDINE HCL 0.1 MG PO TABS
0.1000 mg | ORAL_TABLET | Freq: Once | ORAL | Status: AC
  Administered 2017-07-14: 0.1 mg via ORAL
  Filled 2017-07-14: qty 1

## 2017-07-14 MED ORDER — ROCURONIUM BROMIDE 100 MG/10ML IV SOLN
INTRAVENOUS | Status: DC | PRN
  Administered 2017-07-14: 40 mg via INTRAVENOUS
  Administered 2017-07-14: 20 mg via INTRAVENOUS

## 2017-07-14 MED ORDER — OXYCODONE HCL 5 MG PO TABS: 5.0000 mg | ORAL_TABLET | Freq: Once | ORAL | Status: DC | PRN

## 2017-07-14 MED ORDER — AZELASTINE HCL 0.1 % NA SOLN
1.0000 | Freq: Two times a day (BID) | NASAL | Status: DC
  Filled 2017-07-14: qty 30

## 2017-07-14 MED ORDER — SODIUM CHLORIDE 0.9 % IR SOLN
Status: DC | PRN
  Administered 2017-07-14: 14:00:00

## 2017-07-14 MED ORDER — BUPIVACAINE HCL (PF) 0.5 % IJ SOLN
INTRAMUSCULAR | Status: AC
  Filled 2017-07-14: qty 30

## 2017-07-14 MED ORDER — MONTELUKAST SODIUM 10 MG PO TABS
10.0000 mg | ORAL_TABLET | Freq: Every day | ORAL | Status: DC
  Administered 2017-07-14: 10 mg via ORAL
  Filled 2017-07-14 (×2): qty 1

## 2017-07-14 MED ORDER — LIDOCAINE-EPINEPHRINE 1 %-1:100000 IJ SOLN
INTRAMUSCULAR | Status: DC | PRN
  Administered 2017-07-14: 3.5 mL

## 2017-07-14 MED ORDER — DEXAMETHASONE SODIUM PHOSPHATE 10 MG/ML IJ SOLN
INTRAMUSCULAR | Status: DC | PRN
  Administered 2017-07-14: 10 mg via INTRAVENOUS

## 2017-07-14 MED ORDER — FENTANYL CITRATE (PF) 100 MCG/2ML IJ SOLN
INTRAMUSCULAR | Status: AC
  Administered 2017-07-14: 50 ug via INTRAVENOUS
  Filled 2017-07-14: qty 2

## 2017-07-14 MED ORDER — SODIUM CHLORIDE 0.9 % IV SOLN: INTRAVENOUS | Status: DC

## 2017-07-14 MED ORDER — FLUTICASONE PROPIONATE 50 MCG/ACT NA SUSP
1.0000 | Freq: Every day | NASAL | Status: DC
  Filled 2017-07-14: qty 16

## 2017-07-14 MED ORDER — BACITRACIN ZINC 500 UNIT/GM EX OINT
TOPICAL_OINTMENT | CUTANEOUS | Status: DC | PRN
  Administered 2017-07-14: 1 via TOPICAL

## 2017-07-14 MED ORDER — ONDANSETRON HCL 4 MG/2ML IJ SOLN: 4.0000 mg | Freq: Four times a day (QID) | INTRAMUSCULAR | Status: DC | PRN

## 2017-07-14 MED ORDER — THROMBIN 5000 UNITS EX SOLR
CUTANEOUS | Status: AC
  Filled 2017-07-14: qty 10000

## 2017-07-14 MED ORDER — OXYCODONE HCL 5 MG/5ML PO SOLN: 5.0000 mg | Freq: Once | ORAL | Status: DC | PRN

## 2017-07-14 MED ORDER — SODIUM CHLORIDE 0.9% FLUSH
3.0000 mL | Freq: Two times a day (BID) | INTRAVENOUS | Status: DC
Start: 2017-07-14 — End: 2017-07-15

## 2017-07-14 MED ORDER — BUPIVACAINE HCL (PF) 0.5 % IJ SOLN
INTRAMUSCULAR | Status: DC | PRN
  Administered 2017-07-14: 3.5 mL

## 2017-07-14 MED ORDER — DOCUSATE SODIUM 100 MG PO CAPS
100.0000 mg | ORAL_CAPSULE | Freq: Two times a day (BID) | ORAL | Status: DC
  Administered 2017-07-14 – 2017-07-15 (×2): 100 mg via ORAL
  Filled 2017-07-14 (×2): qty 1

## 2017-07-14 MED ORDER — THROMBIN (RECOMBINANT) 5000 UNITS EX SOLR
CUTANEOUS | Status: DC | PRN
  Administered 2017-07-14: 14:00:00 via TOPICAL

## 2017-07-14 MED ORDER — FENTANYL CITRATE (PF) 250 MCG/5ML IJ SOLN
INTRAMUSCULAR | Status: AC
  Filled 2017-07-14: qty 5

## 2017-07-14 MED ORDER — PROPOFOL 10 MG/ML IV BOLUS
INTRAVENOUS | Status: DC | PRN
  Administered 2017-07-14 (×2): 20 mg via INTRAVENOUS
  Administered 2017-07-14: 120 mg via INTRAVENOUS
  Administered 2017-07-14: 40 mg via INTRAVENOUS

## 2017-07-14 MED ORDER — ADULT MULTIVITAMIN W/MINERALS CH
1.0000 | ORAL_TABLET | Freq: Every day | ORAL | Status: DC
  Administered 2017-07-15: 1 via ORAL
  Filled 2017-07-14: qty 1

## 2017-07-14 MED ORDER — LIDOCAINE 2% (20 MG/ML) 5 ML SYRINGE
INTRAMUSCULAR | Status: AC
  Filled 2017-07-14: qty 5

## 2017-07-14 MED ORDER — OXYCODONE HCL 5 MG/5ML PO SOLN: 5.0000 mg | Freq: Once | ORAL | Status: AC | PRN

## 2017-07-14 MED ORDER — HISTAMINE DIHYDROCHLORIDE 0.025 % EX CREA: 1.0000 "application " | TOPICAL_CREAM | CUTANEOUS | Status: DC | PRN

## 2017-07-14 MED ORDER — PHENYLEPHRINE HCL 10 MG/ML IJ SOLN
INTRAMUSCULAR | Status: DC | PRN
  Administered 2017-07-14: 80 ug via INTRAVENOUS

## 2017-07-14 MED ORDER — OXYCODONE HCL 5 MG PO TABS
5.0000 mg | ORAL_TABLET | ORAL | Status: DC | PRN
  Administered 2017-07-15: 5 mg via ORAL
  Filled 2017-07-14: qty 1

## 2017-07-14 MED ORDER — GLYCOPYRROLATE 0.2 MG/ML IJ SOLN
INTRAMUSCULAR | Status: DC | PRN
  Administered 2017-07-14: 0.2 mg via INTRAVENOUS

## 2017-07-14 MED ORDER — MENTHOL 3 MG MT LOZG: 1.0000 | LOZENGE | OROMUCOSAL | Status: DC | PRN

## 2017-07-14 MED ORDER — TAMSULOSIN HCL 0.4 MG PO CAPS
0.4000 mg | ORAL_CAPSULE | Freq: Every day | ORAL | Status: DC
  Administered 2017-07-14: 0.4 mg via ORAL
  Filled 2017-07-14: qty 1

## 2017-07-14 MED ORDER — OXYCODONE HCL 5 MG PO TABS
5.0000 mg | ORAL_TABLET | Freq: Once | ORAL | Status: AC | PRN
  Administered 2017-07-14: 5 mg via ORAL

## 2017-07-14 MED ORDER — EPHEDRINE SULFATE 50 MG/ML IJ SOLN
INTRAMUSCULAR | Status: DC | PRN
  Administered 2017-07-14: 10 mg via INTRAVENOUS
  Administered 2017-07-14: 25 mg via INTRAVENOUS
  Administered 2017-07-14: 10 mg via INTRAVENOUS

## 2017-07-14 MED ORDER — 0.9 % SODIUM CHLORIDE (POUR BTL) OPTIME
TOPICAL | Status: DC | PRN
  Administered 2017-07-14: 1000 mL

## 2017-07-14 MED ORDER — ONDANSETRON HCL 4 MG/2ML IJ SOLN
INTRAMUSCULAR | Status: AC
  Filled 2017-07-14: qty 2

## 2017-07-14 MED ORDER — PHENYLEPHRINE 40 MCG/ML (10ML) SYRINGE FOR IV PUSH (FOR BLOOD PRESSURE SUPPORT)
PREFILLED_SYRINGE | INTRAVENOUS | Status: AC
  Filled 2017-07-14: qty 10

## 2017-07-14 MED ORDER — NITROGLYCERIN 0.4 MG SL SUBL: 0.4000 mg | SUBLINGUAL_TABLET | SUBLINGUAL | Status: DC | PRN

## 2017-07-14 MED ORDER — SENNA 8.6 MG PO TABS
1.0000 | ORAL_TABLET | Freq: Two times a day (BID) | ORAL | Status: DC
Start: 2017-07-14 — End: 2017-07-15
  Administered 2017-07-14 – 2017-07-15 (×2): 8.6 mg via ORAL
  Filled 2017-07-14 (×2): qty 1

## 2017-07-14 MED ORDER — LIDOCAINE-EPINEPHRINE 1 %-1:100000 IJ SOLN
INTRAMUSCULAR | Status: AC
  Filled 2017-07-14: qty 1

## 2017-07-14 MED ORDER — ACETAMINOPHEN 10 MG/ML IV SOLN
INTRAVENOUS | Status: AC
  Administered 2017-07-14: 1000 mg via INTRAVENOUS
  Filled 2017-07-14: qty 100

## 2017-07-14 SURGICAL SUPPLY — 65 items
BAG DECANTER FOR FLEXI CONT (MISCELLANEOUS) ×3 IMPLANT
BENZOIN TINCTURE PRP APPL 2/3 (GAUZE/BANDAGES/DRESSINGS) ×6 IMPLANT
BIT DRILL 2.4 (BIT) ×2 IMPLANT
BIT DRILL 2.4MM (BIT) ×1
BLADE CLIPPER SURG (BLADE) ×3 IMPLANT
BLADE ULTRA TIP 2M (BLADE) IMPLANT
BUR MATCHSTICK NEURO 3.0 LAGG (BURR) ×3 IMPLANT
BUR PRECISION FLUTE 5.0 (BURR) ×3 IMPLANT
CANISTER SUCT 3000ML PPV (MISCELLANEOUS) ×3 IMPLANT
CARTRIDGE OIL MAESTRO DRILL (MISCELLANEOUS) ×1 IMPLANT
CLOSURE WOUND 1/2 X4 (GAUZE/BANDAGES/DRESSINGS)
DECANTER SPIKE VIAL GLASS SM (MISCELLANEOUS) ×3 IMPLANT
DERMABOND ADVANCED (GAUZE/BANDAGES/DRESSINGS) ×2
DERMABOND ADVANCED .7 DNX12 (GAUZE/BANDAGES/DRESSINGS) ×1 IMPLANT
DIFFUSER DRILL AIR PNEUMATIC (MISCELLANEOUS) ×3 IMPLANT
DRAPE C-ARM 42X72 X-RAY (DRAPES) ×6 IMPLANT
DRAPE LAPAROTOMY 100X72 PEDS (DRAPES) ×3 IMPLANT
DRAPE POUCH INSTRU U-SHP 10X18 (DRAPES) ×3 IMPLANT
DRSG OPSITE POSTOP 4X6 (GAUZE/BANDAGES/DRESSINGS) ×3 IMPLANT
DURAPREP 6ML APPLICATOR 50/CS (WOUND CARE) ×3 IMPLANT
ELECT REM PT RETURN 9FT ADLT (ELECTROSURGICAL) ×3
ELECTRODE REM PT RTRN 9FT ADLT (ELECTROSURGICAL) ×1 IMPLANT
GAUZE SPONGE 4X4 12PLY STRL (GAUZE/BANDAGES/DRESSINGS) IMPLANT
GAUZE SPONGE 4X4 16PLY XRAY LF (GAUZE/BANDAGES/DRESSINGS) IMPLANT
GLOVE BIO SURGEON STRL SZ7 (GLOVE) ×3 IMPLANT
GLOVE BIOGEL PI IND STRL 7.0 (GLOVE) IMPLANT
GLOVE BIOGEL PI IND STRL 7.5 (GLOVE) ×1 IMPLANT
GLOVE BIOGEL PI INDICATOR 7.0 (GLOVE)
GLOVE BIOGEL PI INDICATOR 7.5 (GLOVE) ×2
GLOVE ECLIPSE 7.0 STRL STRAW (GLOVE) ×3 IMPLANT
GLOVE EXAM NITRILE LRG STRL (GLOVE) IMPLANT
GLOVE EXAM NITRILE XL STR (GLOVE) IMPLANT
GLOVE EXAM NITRILE XS STR PU (GLOVE) IMPLANT
GOWN STRL REUS W/ TWL LRG LVL3 (GOWN DISPOSABLE) IMPLANT
GOWN STRL REUS W/ TWL XL LVL3 (GOWN DISPOSABLE) ×1 IMPLANT
GOWN STRL REUS W/TWL 2XL LVL3 (GOWN DISPOSABLE) IMPLANT
GOWN STRL REUS W/TWL LRG LVL3 (GOWN DISPOSABLE)
GOWN STRL REUS W/TWL XL LVL3 (GOWN DISPOSABLE) ×2
GRAFT BN 5X1XSPNE CVD POST DBM (Bone Implant) ×1 IMPLANT
GRAFT BONE MAGNIFUSE 1X5CM (Bone Implant) ×2 IMPLANT
KIT BASIN OR (CUSTOM PROCEDURE TRAY) ×3 IMPLANT
KIT ROOM TURNOVER OR (KITS) ×3 IMPLANT
NEEDLE HYPO 25X1 1.5 SAFETY (NEEDLE) ×3 IMPLANT
NS IRRIG 1000ML POUR BTL (IV SOLUTION) ×3 IMPLANT
OIL CARTRIDGE MAESTRO DRILL (MISCELLANEOUS) ×3
PACK LAMINECTOMY NEURO (CUSTOM PROCEDURE TRAY) ×3 IMPLANT
PAD ARMBOARD 7.5X6 YLW CONV (MISCELLANEOUS) ×9 IMPLANT
PIN MAYFIELD SKULL DISP (PIN) ×3 IMPLANT
ROD PRE-CUT 3.5X30 (Rod) ×6 IMPLANT
SCREW MULTI AXIAL 3.5X14MM (Screw) ×12 IMPLANT
SCREW SET 3600315 STANDARD (Screw) ×12 IMPLANT
SCREW SET 3600315 STD (Screw) ×4 IMPLANT
SPONGE LAP 4X18 X RAY DECT (DISPOSABLE) IMPLANT
SPONGE SURGIFOAM ABS GEL SZ50 (HEMOSTASIS) ×3 IMPLANT
STAPLER SKIN PROX WIDE 3.9 (STAPLE) ×3 IMPLANT
STRIP CLOSURE SKIN 1/2X4 (GAUZE/BANDAGES/DRESSINGS) IMPLANT
SUT ETHILON 3 0 FSL (SUTURE) IMPLANT
SUT VIC AB 0 CT1 18XCR BRD8 (SUTURE) ×2 IMPLANT
SUT VIC AB 0 CT1 8-18 (SUTURE) ×4
SUT VIC AB 2-0 CT1 18 (SUTURE) ×3 IMPLANT
SUT VICRYL 3-0 RB1 18 ABS (SUTURE) ×6 IMPLANT
TOWEL GREEN STERILE (TOWEL DISPOSABLE) ×3 IMPLANT
TOWEL GREEN STERILE FF (TOWEL DISPOSABLE) ×3 IMPLANT
UNDERPAD 30X30 (UNDERPADS AND DIAPERS) ×3 IMPLANT
WATER STERILE IRR 1000ML POUR (IV SOLUTION) ×3 IMPLANT

## 2017-07-14 NOTE — Anesthesia Procedure Notes (Signed)
Procedure Name: Intubation Date/Time: 07/14/2017 1:15 PM Performed by: Fransisca KaufmannMeyer, Ahamed Hofland E, CRNA Pre-anesthesia Checklist: Patient identified, Emergency Drugs available, Suction available and Patient being monitored Patient Re-evaluated:Patient Re-evaluated prior to induction Oxygen Delivery Method: Circle System Utilized Preoxygenation: Pre-oxygenation with 100% oxygen Induction Type: IV induction Ventilation: Mask ventilation without difficulty Laryngoscope Size: Glidescope and 3 Grade View: Grade I Tube type: Oral Tube size: 7.5 mm Number of attempts: 1 Airway Equipment and Method: Stylet and Oral airway Placement Confirmation: ETT inserted through vocal cords under direct vision,  positive ETCO2 and breath sounds checked- equal and bilateral Secured at: 23 cm Tube secured with: Tape Dental Injury: Teeth and Oropharynx as per pre-operative assessment  Difficulty Due To: Difficulty was anticipated, Difficult Airway- due to anterior larynx and Difficult Airway- due to limited oral opening

## 2017-07-14 NOTE — Transfer of Care (Signed)
Immediate Anesthesia Transfer of Care Note  Patient: Daniel [name]  Procedure(s) Performed: LAMINECTOMY CERVICAL THREE- CERVICAL FOUR WITH LATERAL MASS FUSION (N/A )  Patient Location: PACU  Anesthesia Type:General  Level of Consciousness: awake, alert  and patient cooperative  Airway & Oxygen Therapy: Patient Spontanous Breathing and Patient connected to nasal cannula oxygen  Post-op Assessment: Report given to RN, Post -op Vital signs reviewed and stable and Patient moving all extremities  Post vital signs: Reviewed and stable  Last Vitals:  Vitals:   07/14/17 1039 07/14/17 1515  BP: (!) 171/72   Pulse: 71   Resp: 19   Temp: 36.6 C (P) 36.6 C  SpO2: 97%     Last Pain:  Vitals:   07/14/17 1122  TempSrc:   PainSc: 3       Patients Stated Pain Goal: 3 (07/14/17 1122)  Complications: No apparent anesthesia complications

## 2017-07-14 NOTE — Anesthesia Postprocedure Evaluation (Signed)
Anesthesia Post Note  Patient: Genene ChurnJames M Voight  Procedure(s) Performed: LAMINECTOMY CERVICAL THREE- CERVICAL FOUR WITH LATERAL MASS FUSION (N/A )     Patient location during evaluation: PACU Anesthesia Type: General Level of consciousness: awake, awake and alert and oriented Pain management: pain level controlled Vital Signs Assessment: post-procedure vital signs reviewed and stable Respiratory status: spontaneous breathing, nonlabored ventilation and respiratory function stable Cardiovascular status: blood pressure returned to baseline Anesthetic complications: no    Last Vitals:  Vitals:   07/14/17 1730 07/14/17 1745  BP: (!) 183/92 (!) 169/98  Pulse: 86 83  Resp: 20 20  Temp:  36.6 C  SpO2: 96% 95%    Last Pain:  Vitals:   07/14/17 1745  TempSrc:   PainSc: Asleep                 Eriko Economos COKER

## 2017-07-14 NOTE — Progress Notes (Signed)
C/o pain in lower abd and need to vooid w/out ability to do so in urinal /  Bladder scan x 3 >67300mls / I&o cath w/out problems returned 725mls yellow/clear urine / feels better afterwards

## 2017-07-14 NOTE — Anesthesia Preprocedure Evaluation (Addendum)
Anesthesia Evaluation  Patient identified by MRN, date of birth, ID band Patient awake    Reviewed: Allergy & Precautions, NPO status , Patient's Chart, lab work & pertinent test results  Airway Mallampati: II  TM Distance: >3 FB Neck ROM: Full    Dental  (+) Teeth Intact, Dental Advisory Given   Pulmonary    breath sounds clear to auscultation       Cardiovascular hypertension,  Rhythm:Regular Rate:Normal     Neuro/Psych    GI/Hepatic   Endo/Other    Renal/GU      Musculoskeletal   Abdominal   Peds  Hematology   Anesthesia Other Findings   Reproductive/Obstetrics                             Anesthesia Physical Anesthesia Plan  ASA: III  Anesthesia Plan: General   Post-op Pain Management:    Induction: Intravenous  PONV Risk Score and Plan: 1 and Ondansetron and Dexamethasone  Airway Management Planned: Oral ETT  Additional Equipment:   Intra-op Plan:   Post-operative Plan: Extubation in OR  Informed Consent: I have reviewed the patients History and Physical, chart, labs and discussed the procedure including the risks, benefits and alternatives for the proposed anesthesia with the patient or authorized representative who has indicated his/her understanding and acceptance.   Dental advisory given  Plan Discussed with: Anesthesiologist  Anesthesia Plan Comments:         Anesthesia Quick Evaluation

## 2017-07-14 NOTE — H&P (Signed)
Chief Complaint   Neck pain & weakness  HPI   HPI: Daniel [name] is a 84 y.o. male who presents today for elective surgery. He has a several month history primarily of upper extremity numbness and subjective weakness.  He says the symptoms started without any inciting event, although he does note that he did suffer a fall a few days prior to when he started to notice symptoms.  Over the last several months he has noted progressive sensation of heaviness or weakness of his arms.  While he says he can raise his arms above his head, he has a hard time doing this with any weight in his hands.  He has also noted a significant amount of progressive incoordination of his hands.  He says his hand writing has progressively declined, he has difficulty with buttons on a shirt, and other fine motor tasks.  He does have bilateral knee arthritis, but does not complain of a significant amount of imbalance.  He has fallen a couple of times but he thinks more related to his knees.  He has not noted any changes in bowel or bladder function.    MRI of the cervical spine was reviewed.  This demonstrates maintenance of normal cervical lordosis.  Primary finding is at C3-4, where there is a broad-based disc protrusion which abuts the ventral thecal sac.  There is also significant buckling of the ligamentum flavum posteriorly, with indentation of the spinal cord.  Overall, there is severe stenosis at this level, with subtle intrinsic T2 signal change.  There is also hyperintensity within the facet complex suggesting either instability or inflammation at this C3-4 level.  Patient Active Problem List   Diagnosis Date Noted  . DOE (dyspnea on exertion) 03/10/2017  . Acute sinusitis 02/15/2017  . Allergic rhinitis 02/15/2017  . Preoperative cardiovascular examination 08/16/2016  . Chronic pain of both knees 07/13/2016  . Fatigue 09/11/2015  . Unstable angina ([place]) 08/06/2015  . Low TSH level 08/13/2014  . Orthostatic  hypotension 12/17/2013  . Pseudoptosis 05/22/2013  . Dermatochalasis 02/21/2013  . Epiphora 02/21/2013  . CAD S/P PCI June 2008   . S/P CABG x 3   . Essential hypertension   . Dyslipidemia, goal LDL below 70   . Heart palpitations   . Nuclear cataract 01/26/2012  . PCO (posterior capsular opacification) 01/26/2012  . Pseudophakia 01/26/2012    PMH: Past Medical History:  Diagnosis Date  . Arthritis   . CAD of autologous arterial graft 2010   Atretic LIMA; Myoview Jan 2015: Possible mild basal anteroseptal defect thought to be artifact (CRO early LAD ischemia due to inadeuate retrograde perfusion via SVG-Diag  . CAD S/P percutaneous coronary angioplasty June 2008;    a. 6/'08: PCI nongrafted distal RCA-PDA: Mini vision BMS 2.25 mm x 28 mm; b. Relook Cath 08/2015: Stable CAD. No culprit lesion. Widely patent PDA stent. Patent SVG-OM 2, SVG-D2. Essentially atretic/small LIMA-LAD, but the LAD is perfused via SVG-D2  . Complication of anesthesia   . Coronary artery disease involving left main coronary artery 1997-2010   Last cath 2010: 70-80 % ostial left main, 80-90% LAD, subtotal occluded circumflex, RCA now a patent stent; SVG-diagonal backfills LAD, atretic LIMA-LAD -- medical therapy; stable by relook cath in March 2017  . Difficult intubation    VERY NARROW AIRWAY PER ANESTHESIOLOGIST (1997 CABG AT [facility])  . Dyslipidemia, goal LDL below 70   . Dysrhythmia   . Heart palpitations    Never fully diagnosed.  [name]  History of kidney stones   . Hypertension   . Insomnia   . Mass in chest    JUST WATCHING , WILL CHECK OUT AFTER SURGERY  . S/P CABG x 3 1997   LIMA-LAD, SVG-D2, SVG-OM --> LIMA known to be atretic, but SVG-D2 fills diagonal and LAD  . Stenosis of cervical spine with myelopathy   . Tingling    in fingers    PSH: Past Surgical History:  Procedure Laterality Date  . BACK SURGERY     decompression  lower back   . CARDIAC CATHETERIZATION  June 2008   showed high grade  distal RCA and PDA stenosis and underwent PCI with a 2.25 mm x 28 mm Mini Vision bare- metal stent.  [name] CARDIAC CATHETERIZATION  2010   Native coronaries at 70% to 80% ostial left main. LAD had diffuse 80% to 90% stenosis  . CARDIAC CATHETERIZATION N/A 08/08/2015   Procedure: Left Heart Cath and Cors/Grafts Angiography;  Surgeon: Leonie Man, MD;  Location: [facility];  Service: Cardiovascular;  Laterality: N/A;  . COLONOSCOPY    . CORONARY ANGIOPLASTY WITH STENT PLACEMENT    . CORONARY ARTERY BYPASS GRAFT  [id number]   LIMA - LAD, SVG- diagonal, SVG-OM  . EYE SURGERY     BIL CATARACT  . KIDNEY STONE SURGERY    . KNEE ARTHROSCOPY     RT  KNEE  . NM MYOVIEW LTD  02/25/2004; Jan 2015   a) EF 57%; b)  Normal Nuclear Stress Test - No evidence of Ischemia or Infarction. Normal LV Function & wall motion.    Medications Prior to Admission  Medication Sig Dispense Refill Last Dose  . acetaminophen (TYLENOL) 500 MG tablet Take 1,000 mg by mouth every 8 (eight) hours as needed for mild pain.   07/14/2017 at 0400  . azelastine (ASTELIN) 0.1 % nasal spray instill 2 sprays into each nostril twice a day (Patient taking differently: Use 1 spray in each nostril twice daily) 30 mL 5 07/13/2017 at Unknown time  . budesonide (RHINOCORT AQUA) 32 MCG/ACT nasal spray Use 1 spray in each nostril once daily   07/13/2017 at Unknown time  . cholecalciferol (VITAMIN D) 1000 units tablet Take 1,000 Units by mouth daily.   Past Week at Unknown time  . clopidogrel (PLAVIX) 75 MG tablet Take 75 mg by mouth daily.   07/07/2017  . docusate sodium (COLACE) 100 MG capsule Take 200-300 mg by mouth daily as needed for mild constipation or moderate constipation.   Past Week at Unknown time  . Histamine Dihydrochloride (AUSTRALIAN DREAM ARTHRITIS) 0.025 % CREA Apply 1 application topically as needed (for pain).   Past Week at Unknown time  . isosorbide mononitrate (IMDUR) 30 MG 24 hr tablet Take 30 mg by mouth daily.   07/14/2017  at 0800  . lisinopril (PRINIVIL,ZESTRIL) 5 MG tablet Take 1 tablet (5 mg total) daily by mouth. 90 tablet 3 07/14/2017 at 0800  . Melatonin 5 MG TABS Take 5 mg by mouth at bedtime as needed (sleep).   Past Week at Unknown time  . metoprolol succinate (TOPROL-XL) 50 MG 24 hr tablet Take 50-75 mg by mouth See admin instructions. '75mg'$  in the morning, '50mg'$  in the evening   07/14/2017 at 0800  . montelukast (SINGULAIR) 10 MG tablet Take 10 mg by mouth at bedtime.   07/13/2017 at Unknown time  . Multiple Vitamin (MULTIVITAMIN WITH MINERALS) TABS tablet Take 1 tablet by mouth daily.   Past  Week at Unknown time  . tamsulosin (FLOMAX) 0.4 MG CAPS capsule Take 0.4 mg by mouth at bedtime.   0 07/13/2017 at Unknown time  . traMADol (ULTRAM) 50 MG tablet Take 100 mg by mouth 2 (two) times daily.    07/14/2017 at 0400  . vitamin B-12 (CYANOCOBALAMIN) 1000 MCG tablet Take 1,000 mcg by mouth 2 (two) times daily.    Past Week at Unknown time  . zolpidem (AMBIEN) 10 MG tablet Take 10 mg by mouth at bedtime as needed for sleep.   07/13/2017 at Unknown time  . NITROSTAT 0.4 MG SL tablet place 1 tablet under the tongue every 5 minutes if needed for chest pain (Patient taking differently: place 0.4 mg under the tongue every 5 minutes if needed for chest pain) 25 tablet 1 More than a month at Unknown time    SH: Social History   Tobacco Use  . Smoking status: Never Smoker  . Smokeless tobacco: Never Used  Substance Use Topics  . Alcohol use: No  . Drug use: No    MEDS: Prior to Admission medications   Medication Sig Start Date End Date Taking? Authorizing Provider  acetaminophen (TYLENOL) 500 MG tablet Take 1,000 mg by mouth every 8 (eight) hours as needed for mild pain.   Yes [provider]  azelastine (ASTELIN) 0.1 % nasal spray instill 2 sprays into each nostril twice a day Patient taking differently: Use 1 spray in each nostril twice daily 07/08/16  Yes Kozlow, Donnamarie Poag, MD  budesonide (RHINOCORT AQUA) 32  MCG/ACT nasal spray Use 1 spray in each nostril once daily   Yes [provider]  cholecalciferol (VITAMIN D) 1000 units tablet Take 1,000 Units by mouth daily.   Yes [provider]  clopidogrel (PLAVIX) 75 MG tablet Take 75 mg by mouth daily.   Yes [provider]  docusate sodium (COLACE) 100 MG capsule Take 200-300 mg by mouth daily as needed for mild constipation or moderate constipation.   Yes [provider]  Histamine Dihydrochloride (AUSTRALIAN DREAM ARTHRITIS) 0.025 % CREA Apply 1 application topically as needed (for pain).   Yes [provider]  isosorbide mononitrate (IMDUR) 30 MG 24 hr tablet Take 30 mg by mouth daily.   Yes [provider]  lisinopril (PRINIVIL,ZESTRIL) 5 MG tablet Take 1 tablet (5 mg total) daily by mouth. 04/25/17 07/24/17 Yes Leonie Man, MD  Melatonin 5 MG TABS Take 5 mg by mouth at bedtime as needed (sleep).   Yes [provider]  metoprolol succinate (TOPROL-XL) 50 MG 24 hr tablet Take 50-75 mg by mouth See admin instructions. '75mg'$  in the morning, '50mg'$  in the evening   Yes [provider]  montelukast (SINGULAIR) 10 MG tablet Take 10 mg by mouth at bedtime.   Yes [provider]  Multiple Vitamin (MULTIVITAMIN WITH MINERALS) TABS tablet Take 1 tablet by mouth daily.   Yes [provider]  tamsulosin (FLOMAX) 0.4 MG CAPS capsule Take 0.4 mg by mouth at bedtime.  06/08/14  Yes [provider]  traMADol (ULTRAM) 50 MG tablet Take 100 mg by mouth 2 (two) times daily.    Yes [provider]  vitamin B-12 (CYANOCOBALAMIN) 1000 MCG tablet Take 1,000 mcg by mouth 2 (two) times daily.    Yes [provider]  zolpidem (AMBIEN) 10 MG tablet Take 10 mg by mouth at bedtime as needed for sleep.   Yes [provider]  NITROSTAT 0.4 MG SL tablet place  1 tablet under the tongue every 5 minutes if needed for chest pain Patient taking differently: place 0.4  mg under the tongue every 5 minutes if needed for chest pain 02/04/17   Erlene Quan, PA-C    ALLERGY: Allergies  Allergen Reactions  . Penicillins Swelling and Other (See Comments)    Swelling and redness in joints  Has patient had a PCN reaction causing immediate rash, facial/tongue/throat swelling, SOB or lightheadedness with hypotension: No Has patient had a PCN reaction causing severe rash involving mucus membranes or skin necrosis: No Has patient had a PCN reaction that required hospitalization: No Has patient had a PCN reaction occurring within the last 10 years: No If all of the above answers are "NO", then may proceed with Cephalosporin use.   . Sulfa Antibiotics Other (See Comments)    Pain in left side underneath ribcage-pancreas  . Statins Other (See Comments)    UNSPECIFIED REACTION  "INTOLERANCE AT HIGH DOSES"    Social History   Tobacco Use  . Smoking status: Never Smoker  . Smokeless tobacco: Never Used  Substance Use Topics  . Alcohol use: No     Family History  Problem Relation Age of Onset  . Stroke Mother 75  . Heart disease Mother   . Arthritis Mother   . Heart attack Father 86  . Other Sister   . Heart disease Brother      ROS   Review of Systems  Constitutional: Negative for chills and fever.  HENT: Negative.   Eyes: Negative.   Respiratory: Negative.   Cardiovascular: Negative.   Gastrointestinal: Negative.   Genitourinary: Negative.   Musculoskeletal: Positive for myalgias and neck pain.  Skin: Negative.   Neurological: Positive for tingling and weakness. Negative for dizziness, tremors, sensory change, speech change, focal weakness, seizures, loss of consciousness and headaches.    Exam   Vitals:   07/14/17 1039  BP: (!) 171/72  Pulse: 71  Resp: 19  Temp: 97.8 F (36.6 C)  SpO2: 97%   General appearance: elderly male, resting comfortably, NAD Eyes: PERRL, Fundoscopic: normal Cardiovascular: Regular rate and rhythm without  murmurs, rubs, gallops. No edema or variciosities. Distal pulses normal. Pulmonary: Clear to auscultation Musculoskeletal:     Muscle tone upper extremities: Normal    Muscle tone lower extremities: Normal    Motor exam: Upper Extremities Deltoid Bicep Tricep Grip  Right 5/5 5/5 5/5 4+/5  Left 5/5 5/5 5/5 4+/5   Lower Extremity IP Quad PF DF EHL  Right 5/5 5/5 5/5 5/5 5/5  Left 5/5 5/5 5/5 5/5 5/5   Neurological Awake, alert, oriented Memory and concentration grossly intact Speech fluent, appropriate CNII: Visual fields normal CNIII/IV/VI: EOMI CNV: Facial sensation normal CNVII: Symmetric, normal strength CNVIII: Grossly normal CNIX: Normal palate movement CNXI: Trap and SCM strength normal CN XII: Tongue protrusion normal Sensation grossly intact to LT DTR: Normal Coordination (finger/nose & heel/shin): Normal  Results - Imaging/Labs   Results for orders placed or performed during the hospital encounter of 07/12/17 (from the past 48 hour(s))  Surgical pcr screen     Status: None   Collection Time: 07/12/17  2:51 PM  Result Value Ref Range   MRSA, PCR NEGATIVE NEGATIVE   Staphylococcus aureus NEGATIVE NEGATIVE    Comment: (NOTE) The Xpert SA Assay (FDA approved for NASAL specimens in patients 72 years of age and older), is one component of a comprehensive surveillance program. It is not intended to diagnose infection nor to  guide or monitor treatment. Performed at [facility], [place] [address], [place], [place] [address]   Type and screen     Status: None   Collection Time: 07/12/17  2:55 PM  Result Value Ref Range   ABO/RH(D) O POS    Antibody Screen NEG    Sample Expiration      07/15/2017 Performed at [facility], [place] [address], [place], [place] [address]   ABO/Rh     Status: None   Collection Time: 07/12/17  2:55 PM  Result Value Ref Range   ABO/RH(D)      O POS Performed at [facility] [address], [place] [address]    CBC     Status: None   Collection Time: 07/12/17  3:01 PM  Result Value Ref Range   WBC 6.1 4.0 - 10.5 K/uL   RBC 4.83 4.22 - 5.81 MIL/uL   Hemoglobin 14.3 13.0 - 17.0 g/dL   HCT 43.3 39.0 - 52.0 %   MCV 89.6 78.0 - 100.0 fL   MCH 29.6 26.0 - 34.0 pg   MCHC 33.0 30.0 - 36.0 g/dL   RDW 13.7 11.5 - 15.5 %   Platelets 249 150 - 400 K/uL    Comment: Performed at [facility] [address], [place], [place] [address]  Basic metabolic panel     Status: Abnormal   Collection Time: 07/12/17  3:01 PM  Result Value Ref Range   Sodium 140 135 - 145 mmol/L   Potassium 4.3 3.5 - 5.1 mmol/L   Chloride 102 101 - 111 mmol/L   CO2 26 22 - 32 mmol/L   Glucose, Bld 104 (H) 65 - 99 mg/dL   BUN 16 6 - 20 mg/dL   Creatinine, Ser 0.89 0.61 - 1.24 mg/dL   Calcium 9.6 8.9 - 10.3 mg/dL   GFR calc non Af Amer >60 >60 mL/min   GFR calc Af Amer >60 >60 mL/min    Comment: (NOTE) The eGFR has been calculated using the CKD EPI equation. This calculation has not been validated in all clinical situations. eGFR's persistently <60 mL/min signify possible Chronic Kidney Disease.    Anion gap 12 5 - 15    Comment: Performed at [facility] [address] [place], [place] [address]    No results found.  Impression/Plan   84 y.o. male with symptoms consistent with cervical myelopathy, and severe stenosis at C3-4.  It appears that he has a significant amount of ligamentum flavum hypertrophy and buckling, and maintenance of cervical lordosis making posterior decompression reasonable.    I reviewed the imaging findings with the patient and his granddaughter in the office.  Treatment options were discussed, including my recommendation for surgical decompression.  I explained to the patient the details of the procedure, as well as the risks which include but are not limited to spinal cord or nerve injury leading to weakness/numbness/paralysis and/or bowel and bladder dysfunction, CSF leak, bleeding,  and infection.  Possible outcomes of surgery were also discussed including the possibility of persistence or worsening of symptoms and the possibility of accelerated adjacent level degeneration. The general risks of anesthesia were also reviewed including heart attack, stroke, and DVT/PE.    Will plan on proceeding with C3-4 laminectomy with lateral mass fusion. The patient understood our discussion and is willing to proceed with surgical decompression and fusion.  All questions were answered.

## 2017-07-15 ENCOUNTER — Telehealth: Payer: Self-pay | Admitting: Cardiology

## 2017-07-15 ENCOUNTER — Other Ambulatory Visit: Payer: Self-pay

## 2017-07-15 MED ORDER — CLOPIDOGREL BISULFATE 75 MG PO TABS: 75.0000 mg | ORAL_TABLET | Freq: Every day | ORAL | Status: DC

## 2017-07-15 MED ORDER — METHOCARBAMOL 500 MG PO TABS: 500.0000 mg | ORAL_TABLET | Freq: Three times a day (TID) | ORAL | 0 refills | Status: DC | PRN

## 2017-07-15 MED ORDER — OXYCODONE HCL 10 MG PO TABS: 10.0000 mg | ORAL_TABLET | Freq: Four times a day (QID) | ORAL | 0 refills | Status: DC | PRN

## 2017-07-15 MED FILL — Thrombin For Soln 5000 Unit: CUTANEOUS | Qty: 5000 | Status: AC

## 2017-07-15 NOTE — Evaluation (Signed)
Physical Therapy Evaluation Patient Details Name: Daniel [name] MRN: [id number] DOB: 12/06/1932 Today's Date: 07/15/2017   History of Present Illness  Pt is an [age] y/o male who presents s/p C3-C4 posterior cervical fusion on 07/14/17.   Clinical Impression  Pt admitted with above diagnosis. Pt currently with functional limitations due to the deficits listed below (see PT Problem List). At the time of PT eval pt was able to perform transfers and ambulation with gross min guard assist. With ambulation, pt progressed to supervision however noted a posterior LOB during a turn with the RW that required hands-on guarding to recover. Pt will benefit from skilled PT to increase their independence and safety with mobility to allow discharge to the venue listed below.       Follow Up Recommendations No PT follow up;Supervision for mobility/OOB    Equipment Recommendations  None recommended by PT    Recommendations for Other Services       Precautions / Restrictions Precautions Precautions: Fall;Cervical Required Braces or Orthoses: (No brace needed order)      Mobility  Bed Mobility Overal bed mobility: Needs Assistance Bed Mobility: Sit to Supine       Sit to supine: Min assist   General bed mobility comments: Assist for LE elevation up into bed. Pt was cued for log roll technique, however performed more of a sit>supine transition. Pt did not wish to attempt again with proper technique.   Transfers Overall transfer level: Needs assistance Equipment used: Rolling walker (2 wheeled) Transfers: Sit to/from Stand Sit to Stand: Min guard         General transfer comment: Hands-on guarding provided for power-up to stand. Pt required x2 attempts and increased time to achieve full standing position.   Ambulation/Gait Ambulation/Gait assistance: Min guard;Supervision Ambulation Distance (Feet): 350 Feet Assistive device: Rolling walker (2 wheeled) Gait Pattern/deviations: Step-through  pattern;Decreased stride length;Trunk flexed Gait velocity: Decreased Gait velocity interpretation: Below normal speed for age/gender General Gait Details: Significant rounded shoulders with forward head posture noted. Pt unable to improve due to pain. Initially hands-on guarding provided however progressed to supervision for safety. One LOB noted during turn - min guard assist to recover.  Stairs         General stair comments: Pt declined  Wheelchair Mobility    Modified Rankin (Stroke Patients Only)       Balance Overall balance assessment: Needs assistance Sitting-balance support: Feet supported;No upper extremity supported Sitting balance-Leahy Scale: Fair     Standing balance support: Bilateral upper extremity supported;During functional activity Standing balance-Leahy Scale: Poor Standing balance comment: Reliant on RW for support                             Pertinent Vitals/Pain Pain Assessment: Faces Faces Pain Scale: Hurts little more Pain Location: Incision site Pain Descriptors / Indicators: Operative site guarding;Aching;Discomfort Pain Intervention(s): Limited activity within patient's tolerance;Monitored during session;Repositioned    Home Living Family/patient expects to be discharged to:: Private residence Living Arrangements: Spouse/significant other Available Help at Discharge: Family;Available 24 hours/day Type of Home: House Home Access: Stairs to enter Entrance Stairs-Rails: None(door jam) Entrance Stairs-Number of Steps: 1 Home Layout: One level Home Equipment: Grab bars - tub/shower;Grab bars - toilet;Shower seat;Walker - 2 wheels      Prior Function Level of Independence: Independent with assistive device(s)         Comments: RW for mobility, mod I with ADL  Hand Dominance        Extremity/Trunk Assessment   Upper Extremity Assessment Upper Extremity Assessment: Defer to OT evaluation    Lower Extremity  Assessment Lower Extremity Assessment: Generalized weakness    Cervical / Trunk Assessment Cervical / Trunk Assessment: Other exceptions Cervical / Trunk Exceptions: s/p surgery  Communication   Communication: No difficulties  Cognition Arousal/Alertness: Awake/alert Behavior During Therapy: WFL for tasks assessed/performed Overall Cognitive Status: Within Functional Limits for tasks assessed                                        General Comments      Exercises     Assessment/Plan    PT Assessment Patient needs continued PT services  PT Problem List Decreased strength;Decreased range of motion;Decreased activity tolerance;Decreased balance;Decreased mobility;Decreased knowledge of use of DME;Decreased safety awareness;Decreased knowledge of precautions;Pain       PT Treatment Interventions DME instruction;Gait training;Stair training;Functional mobility training;Therapeutic activities;Therapeutic exercise;Neuromuscular re-education;Patient/family education    PT Goals (Current goals can be found in the Care Plan section)  Acute Rehab PT Goals Patient Stated Goal: Get home to his wife PT Goal Formulation: With patient Time For Goal Achievement: 07/22/17 Potential to Achieve Goals: Good    Frequency Min 5X/week   Barriers to discharge        Co-evaluation               AM-PAC PT "6 Clicks" Daily Activity  Outcome Measure Difficulty turning over in bed (including adjusting bedclothes, sheets and blankets)?: None Difficulty moving from lying on back to sitting on the side of the bed? : A Little Difficulty sitting down on and standing up from a chair with arms (e.g., wheelchair, bedside commode, etc,.)?: A Little Help needed moving to and from a bed to chair (including a wheelchair)?: A Little Help needed walking in hospital room?: A Little Help needed climbing 3-5 steps with a railing? : A Little 6 Click Score: 19    End of Session Equipment  Utilized During Treatment: Gait belt Activity Tolerance: Patient tolerated treatment well Patient left: in bed;with call bell/phone within reach Nurse Communication: Mobility status;Patient requests pain meds PT Visit Diagnosis: Unsteadiness on feet (R26.81);Pain;Other symptoms and signs involving the nervous system (R29.898) Pain - part of body: (neck)    Time: 9629-5284 PT Time Calculation (min) (ACUTE ONLY): 18 min   Charges:   PT Evaluation $PT Eval Moderate Complexity: 1 Mod     PT G Codes:        Conni Slipper, PT, DPT Acute Rehabilitation Services Pager: [phone]   Marylynn Pearson 07/15/2017, 10:55 AM

## 2017-07-15 NOTE — Telephone Encounter (Signed)
New message  Pt c/o BP issue: STAT if pt c/o blurred vision, one-sided weakness or slurred speech  1. What are your last 5 BP readings? Pt called and said he does not remember the numbers just that it was in the 180s and 190 ranges  2. Are you having any other symptoms (ex. Dizziness, headache, blurred vision, passed out)? Pt had neck surgery yesterday, pt states that he is in pain advised pt her should call the surgeon office  3. What is your BP issue? Please call   Pt c/o medication issue:  1. Name of Medication: clopidogrel (PLAVIX) 75 MG tablet  2. How are you currently taking this medication (dosage and times per day)?Take 1 tablet (75 mg total) by mouth daily.   3. Are you having a reaction (difficulty breathing--STAT)? no  4. What is your medication issue? Pt just had surgery yesterday and wants to know if he can start back taking his plavix

## 2017-07-15 NOTE — Progress Notes (Signed)
Patient is discharged from room 3C03 at this time. Alert and in stable condition. IV site d/c'd and instructions read to patient and family with understanding verbalized. Left unit via wheelchair with all belongings at side. 

## 2017-07-15 NOTE — Telephone Encounter (Signed)
Returned call to patient.He stated he had neck surgery yesterday.He was told to restart Plavix 2/14.He wanted to make sure Dr.Harding is ok starting then.Also he is in a lot of neck pain.B/P elevated systolic 180 to 190.He has not taken his pain med.Advised to take his pain medication.Advised pain will cause elevated B/P.Advised to call back if B/P continues to be elevated.Message sent to Dr.Harding.

## 2017-07-15 NOTE — Evaluation (Signed)
Occupational Therapy Evaluation Patient Details Name: Daniel [name] MRN: [id number] DOB: 1933/05/02 Today's Date: 07/15/2017    History of Present Illness Pt is an 84 y/o male who presents s/p C3-C4 posterior cervical fusion on 07/14/17.    Clinical Impression   Pt reports he was modified independent with ADL PTA. Currently pt min guard-min assist with ADL and functional mobility. Began neck, safety, and ADL education with pt. Pt planning to d/c home with 24/7 supervision from family. Pt would benefit from continued skilled OT to address established goals.    Follow Up Recommendations  No OT follow up;Supervision/Assistance - 24 hour    Equipment Recommendations  None recommended by OT    Recommendations for Other Services       Precautions / Restrictions Precautions Precautions: Fall;Cervical Precaution Comments: Educated pt on cervical precautions Required Braces or Orthoses: (No brace needed order) Restrictions Weight Bearing Restrictions: No      Mobility Bed Mobility Overal bed mobility: Needs Assistance Bed Mobility: Rolling;Sidelying to Sit Rolling: Supervision Sidelying to sit: Supervision       General bed mobility comments: HOB flat with use of bed rails. Increased time and effort  Transfers Overall transfer level: Needs assistance Equipment used: Rolling walker (2 wheeled) Transfers: Sit to/from Stand Sit to Stand: Min guard         General transfer comment: for safety, pt unsteady in standing.    Balance Overall balance assessment: Needs assistance Sitting-balance support: Feet supported;No upper extremity supported Sitting balance-Leahy Scale: Fair     Standing balance support: Bilateral upper extremity supported;During functional activity Standing balance-Leahy Scale: Poor                             ADL either performed or assessed with clinical judgement   ADL Overall ADL's : Needs assistance/impaired Eating/Feeding: Set  up;Sitting   Grooming: Set up;Supervision/safety;Sitting   Upper Body Bathing: Set up;Supervision/ safety;Sitting   Lower Body Bathing: Min guard;Sit to/from stand   Upper Body Dressing : Set up;Supervision/safety;Sitting   Lower Body Dressing: Minimal assistance;Sit to/from stand   Toilet Transfer: Min guard;Ambulation;RW       Tub/ Shower Transfer: Min guard;Walk-in shower;Ambulation;Shower seat;Grab bars;Rolling walker   Functional mobility during ADLs: Min guard;Rolling walker General ADL Comments: Educated pt on maintaining cervical precuations during functional activities     Vision         Perception     Praxis      Pertinent Vitals/Pain Pain Assessment: Faces Faces Pain Scale: Hurts little more Pain Location: neck Pain Descriptors / Indicators: Sore Pain Intervention(s): Monitored during session;Repositioned     Hand Dominance     Extremity/Trunk Assessment Upper Extremity Assessment Upper Extremity Assessment: Generalized weakness(numbness/tingling bil UEs, present PTA)   Lower Extremity Assessment Lower Extremity Assessment: Defer to PT evaluation   Cervical / Trunk Assessment Cervical / Trunk Assessment: Other exceptions Cervical / Trunk Exceptions: s/p cervical surgery   Communication Communication Communication: No difficulties   Cognition Arousal/Alertness: Awake/alert Behavior During Therapy: WFL for tasks assessed/performed Overall Cognitive Status: Within Functional Limits for tasks assessed                                     General Comments       Exercises     Shoulder Instructions      Home Living Family/patient expects to be discharged  to:: Private residence Living Arrangements: Spouse/significant other Available Help at Discharge: Family;Available 24 hours/day Type of Home: House Home Access: Stairs to enter Entergy CorporationEntrance Stairs-Number of Steps: 1 Entrance Stairs-Rails: None Home Layout: One level      Bathroom Shower/Tub: Producer, television/film/videoWalk-in shower   Bathroom Toilet: Standard     Home Equipment: Grab bars - tub/shower;Grab bars - toilet;Shower seat;Walker - 2 wheels          Prior Functioning/Environment Level of Independence: Independent with assistive device(s)        Comments: RW for mobility, mod I with ADL        OT Problem List: Decreased strength;Decreased activity tolerance;Impaired balance (sitting and/or standing);Decreased knowledge of use of DME or AE;Decreased knowledge of precautions;Impaired sensation;Impaired UE functional use;Pain      OT Treatment/Interventions: Self-care/ADL training;Energy conservation;DME and/or AE instruction;Therapeutic activities;Patient/family education;Balance training    OT Goals(Current goals can be found in the care plan section) Acute Rehab OT Goals Patient Stated Goal: Get home to his wife OT Goal Formulation: With patient Time For Goal Achievement: 07/29/17 Potential to Achieve Goals: Good ADL Goals Pt Will Perform Grooming: with modified independence;standing Pt Will Perform Upper Body Bathing: with modified independence;sitting Pt Will Perform Lower Body Bathing: with modified independence;sit to/from stand Pt Will Perform Upper Body Dressing: with modified independence;sitting Pt Will Perform Lower Body Dressing: with modified independence;sit to/from stand Pt Will Transfer to Toilet: with modified independence;ambulating;bedside commode  OT Frequency: Min 2X/week   Barriers to D/C:            Co-evaluation              AM-PAC PT "6 Clicks" Daily Activity     Outcome Measure Help from another person eating meals?: None Help from another person taking care of personal grooming?: A Little Help from another person toileting, which includes using toliet, bedpan, or urinal?: A Little Help from another person bathing (including washing, rinsing, drying)?: A Little Help from another person to put on and taking off regular  upper body clothing?: A Little Help from another person to put on and taking off regular lower body clothing?: A Little 6 Click Score: 19   End of Session Equipment Utilized During Treatment: Rolling walker Nurse Communication: Mobility status;Other (comment)(no equipment or f/u needs)  Activity Tolerance: Patient tolerated treatment well Patient left: in chair;with call bell/phone within reach  OT Visit Diagnosis: Unsteadiness on feet (R26.81);Other abnormalities of gait and mobility (R26.89);Pain Pain - part of body: (neck)                Time: [id number] OT Time Calculation (min): 23 min Charges:  OT General Charges $OT Visit: 1 Visit OT Evaluation $OT Eval Moderate Complexity: 1 Mod OT Treatments $Self Care/Home Management : 8-22 mins G-Codes:     Farha Dano A. Brett Albinooffey, M.S., OTR/L Pager: [phone]  Gaye AlkenBailey A Kaylynn Chamblin 07/15/2017, 2:52 PM

## 2017-07-15 NOTE — Discharge Summary (Signed)
Physician Discharge Summary  Patient ID: Daniel ParmaJames M Kropf MRN: [id number] DOB/AGE: 82/11/1932 82 y.o.  Admit date: 07/14/2017 Discharge date: 07/15/2017  Admission Diagnoses:  Cervical stenosis withmyelopathy  Discharge Diagnoses:  Same Active Problems:   Stenosis of cervical spine with myelopathy   Discharged Condition: Stable  Hospital Course:  Daniel [name] is a 84 y.o. male admitted after elective C3-4 laminectomy and lateral mass fusion. He was at baseline postop, ambulating well, tolerating diet.  Treatments: Surgery - C3-C4 laminectomy, lateral mass fusion  Discharge Exam: Blood pressure (!) 164/76, pulse 78, temperature 98.3 F (36.8 C), temperature source Oral, resp. rate 20, height 6\' 3"  (1.905 m), weight 190 lb (86.2 kg), SpO2 98 %. Awake, alert, oriented Speech fluent, appropriate CN grossly intact 5/5 BUE/BLE Wound c/d/i  Disposition: 01-Home or Self Care  Discharge Instructions    Call MD for:  redness, tenderness, or signs of infection (pain, swelling, redness, odor or green/yellow discharge around incision site)   Complete by:  As directed    Call MD for:  temperature >100.4   Complete by:  As directed    Diet - low sodium heart healthy   Complete by:  As directed    Discharge instructions   Complete by:  As directed    Walk at home as much as possible, at least 4 times / day   Increase activity slowly   Complete by:  As directed    Lifting restrictions   Complete by:  As directed    No lifting > 10 lbs   May shower / Bathe   Complete by:  As directed    48 hours after surgery   May walk up steps   Complete by:  As directed    No dressing needed   Complete by:  As directed    Other Restrictions   Complete by:  As directed    No bending/twisting at waist     Allergies as of 07/15/2017      Reactions   Penicillins Swelling, Other (See Comments)   Swelling and redness in joints Has patient had a PCN reaction causing immediate rash,  facial/tongue/throat swelling, SOB or lightheadedness with hypotension: No Has patient had a PCN reaction causing severe rash involving mucus membranes or skin necrosis: No Has patient had a PCN reaction that required hospitalization: No Has patient had a PCN reaction occurring within the last 10 years: No If all of the above answers are "NO", then may proceed with Cephalosporin use.   Sulfa Antibiotics Other (See Comments)   Pain in left side underneath ribcage-pancreas   Statins Other (See Comments)   UNSPECIFIED REACTION  "INTOLERANCE AT HIGH DOSES"      Medication List    TAKE these medications   acetaminophen 500 MG tablet Commonly known as:  TYLENOL Take 1,000 mg by mouth every 8 (eight) hours as needed for mild pain.   AUSTRALIAN DREAM ARTHRITIS 0.025 % Crea Generic drug:  Histamine Dihydrochloride Apply 1 application topically as needed (for pain).   azelastine 0.1 % nasal spray Commonly known as:  ASTELIN instill 2 sprays into each nostril twice a day What changed:  See the new instructions.   budesonide 32 MCG/ACT nasal spray Commonly known as:  RHINOCORT AQUA Use 1 spray in each nostril once daily   cholecalciferol 1000 units tablet Commonly known as:  VITAMIN D Take 1,000 Units by mouth daily.   clopidogrel 75 MG tablet Commonly known as:  PLAVIX Take 1 tablet (75  mg total) by mouth daily. Start taking on:  07/21/2017 What changed:  These instructions start on 07/21/2017. If you are unsure what to do until then, ask your doctor or other care provider.   docusate sodium 100 MG capsule Commonly known as:  COLACE Take 200-300 mg by mouth daily as needed for mild constipation or moderate constipation.   isosorbide mononitrate 30 MG 24 hr tablet Commonly known as:  IMDUR Take 30 mg by mouth daily.   lisinopril 5 MG tablet Commonly known as:  PRINIVIL,ZESTRIL Take 1 tablet (5 mg total) daily by mouth.   Melatonin 5 MG Tabs Take 5 mg by mouth at bedtime as  needed (sleep).   methocarbamol 500 MG tablet Commonly known as:  ROBAXIN Take 1 tablet (500 mg total) by mouth every 8 (eight) hours as needed for muscle spasms.   metoprolol succinate 50 MG 24 hr tablet Commonly known as:  TOPROL-XL Take 50-75 mg by mouth See admin instructions. 75mg  in the morning, 50mg  in the evening   montelukast 10 MG tablet Commonly known as:  SINGULAIR Take 10 mg by mouth at bedtime.   multivitamin with minerals Tabs tablet Take 1 tablet by mouth daily.   NITROSTAT 0.4 MG SL tablet Generic drug:  nitroGLYCERIN place 1 tablet under the tongue every 5 minutes if needed for chest pain What changed:  See the new instructions.   Oxycodone HCl 10 MG Tabs Take 1 tablet (10 mg total) by mouth every 6 (six) hours as needed for severe pain ((score 7 to 10)).   tamsulosin 0.4 MG Caps capsule Commonly known as:  FLOMAX Take 0.4 mg by mouth at bedtime.   traMADol 50 MG tablet Commonly known as:  ULTRAM Take 100 mg by mouth 2 (two) times daily.   vitamin B-12 1000 MCG tablet Commonly known as:  CYANOCOBALAMIN Take 1,000 mcg by mouth 2 (two) times daily.   zolpidem 10 MG tablet Commonly known as:  AMBIEN Take 10 mg by mouth at bedtime as needed for sleep.      Follow-up Information    Lisbeth Renshaw, MD Follow up in 3 week(s).   Specialty:  Neurosurgery Contact information: [address] [place] [address] [phone]           Signed: Jackelyn Hoehn 07/15/2017, 8:07 AM

## 2017-07-18 ENCOUNTER — Encounter (HOSPITAL_COMMUNITY): Payer: Self-pay | Admitting: Neurosurgery

## 2017-07-19 NOTE — Telephone Encounter (Signed)
Spoke to patient . Information given .  Verbalized understanding Patient states he has upcoming appointment next week if can make it , if so he call and reschedule

## 2017-07-19 NOTE — Telephone Encounter (Signed)
The timing of when he should restart his Plavix is based more upon the surgeon's discretion.  As soon as I feel it is safe it is okay to restart. As for his blood pressure, if it is associated with him having preferably treat the pain watch his blood pressure.  Last saw him his blood pressure was 110/50 and would be reluctant to put him on higher blood pressure medications, for fear of hypotension.

## 2017-07-21 NOTE — Op Note (Addendum)
PREOP DIAGNOSIS:  1. Cervical stenosis with myelopathy, C3-4  POSTOP DIAGNOSIS: Same  PROCEDURE: 1. C3-4 laminectomy for decompression of thecal sac 2. Posterior non-segmental instrumentation, C3-4 with Medtronic lateral mass screws 3. Posterolateral arthrodesis, C3-4  4. Use of non-structural bone allograft - Medtronic Magnifuse 5cm bag x 2 5. Use of locally harvested bone autograft  SURGEON: Dr. Lisbeth Renshaw, MD  ASSISTANT: Cindra Presume, PA-C  ANESTHESIA: General Endotracheal  EBL: 50cc  SPECIMENS: None  DRAINS: None  COMPLICATIONS: None immediate  CONDITION: Stable to PACU  HISTORY: Daniel [name] is a 84 y.o. male Initially seen in the Outpatient Neurosurgery Clinic with signs and symptoms consistent with cervical myelopathy.  His MRI demonstrates severe stenosis at C3-4 likely related to buckling of the ligamentum flavum with instability at this level.  After a lengthy discussion of treatment options including risks and benefits of each, he elected to proceed with decompression and stabilization at C3-4.  PROCEDURE IN DETAIL: After informed consent was obtained and witnessed, the patient was brought to the operating room. After induction of general anesthesia, the patient was positioned on the operative table in the   [place] head holder in the prone position. All pressure points were meticulously padded. Skin incision was then marked out and prepped and draped in the usual sterile fashion.  After time-out was conducted, incision was infiltrated with local anesthetic with epinephrine.  Skin incision was then made sharply and carried through the subcutaneous tissue until the nuchal fascia was identified and incised in the midline.  Avascular midline plane was then utilized to dissect through suboccipital musculature until the spinous process of C2, C3, and C4 were identified.  Subperiosteal dissection was then carried out and self-retaining retractors were placed.   Intraoperative fluoroscopy was used to confirm our location at the correct levels.    At this point, drill was used to create entry points for lateral mass screws at C3 and C4 bilaterally.  High-speed drill was then used to create a pilot hole using standard anatomic landmarks with superior and laterally directed trajectory.  Pilot holes were then probed and tapped to 3.5 millimeters in preparation for placement of lateral mass screws.    I then used combination of rongeurs and a high-speed drill to perform an inferior laminectomy at C3 and superior laminectomy at C4.  The ligamentum flavum in the interspace was identified and noted to be significantly thickened.  This was removed with a combination of thin plate Kerrison Rogers.  Thecal sac was then noted to be well decompressed.    High-speed drill was then used to decorticate the lateral mass at C3 the C3-4 joint space and the lateral mass of C4 bilaterally in preparation for posterolateral arthrodesis.  3.5 x 14 millimeter lateral mass screws were then placed bilaterally at C3 and C4.   Rod was then placed bilaterally and secured with set screws which were final tightened.  The lateral mass was then covered with bone harvested during the laminectomy and with 5 centimeter bags of Magnifuse  for arthrodesis.  Final AP and lateral fluoroscopic images demonstrated good position of the implanted hardware.  At this point the wound is irrigated with copious amounts of normal saline irrigation. Good hemostasis was achieved with a combination of morselized Gelfoam with thrombin and bipolar electrocautery.  The wound was then closed in multiple layers in standard fashion with a combination of 0 Vicryl stitches.  Skin was closed with  Interrupted 3 0 Vicryl stitches.  A layer of Dermabond  was then placed.  Sterile dressing was then applied after Dermabond was allowed to completely dry.  Patient was then transferred to the stretcher and removed from the [place]  head holder.  He was extubated and taken to the postanesthesia care unit in stable hemodynamic condition.  At the end of the case all sponge, needle, instrument, and cottonoid counts were correct.

## 2017-07-25 ENCOUNTER — Other Ambulatory Visit: Payer: Self-pay

## 2017-07-25 MED ORDER — AZELASTINE HCL 0.1 % NA SOLN: NASAL | 5 refills | Status: DC

## 2017-07-25 NOTE — Telephone Encounter (Signed)
Refill request for azelastine 0.1%. Sending in.

## 2017-07-26 ENCOUNTER — Ambulatory Visit: Payer: Medicare PPO | Admitting: Cardiology

## 2017-08-05 ENCOUNTER — Encounter (HOSPITAL_COMMUNITY): Payer: Self-pay

## 2017-08-05 ENCOUNTER — Emergency Department (HOSPITAL_COMMUNITY): Payer: Medicare PPO

## 2017-08-05 ENCOUNTER — Emergency Department (HOSPITAL_COMMUNITY)
Admission: EM | Admit: 2017-08-05 | Discharge: 2017-08-05 | Disposition: A | Discharge status: Home / Self Care | Payer: Medicare PPO | Attending: Emergency Medicine | Admitting: Emergency Medicine

## 2017-08-05 ENCOUNTER — Other Ambulatory Visit: Payer: Self-pay

## 2017-08-05 DIAGNOSIS — I10 Essential (primary) hypertension: Secondary | ICD-10-CM

## 2017-08-05 DIAGNOSIS — Z79899 Other long term (current) drug therapy: Secondary | ICD-10-CM | POA: Diagnosis not present

## 2017-08-05 DIAGNOSIS — R531 Weakness: Secondary | ICD-10-CM | POA: Insufficient documentation

## 2017-08-05 DIAGNOSIS — R0602 Shortness of breath: Secondary | ICD-10-CM | POA: Diagnosis not present

## 2017-08-05 DIAGNOSIS — E782 Mixed hyperlipidemia: Secondary | ICD-10-CM

## 2017-08-05 DIAGNOSIS — Z955 Presence of coronary angioplasty implant and graft: Secondary | ICD-10-CM | POA: Diagnosis not present

## 2017-08-05 DIAGNOSIS — R7989 Other specified abnormal findings of blood chemistry: Secondary | ICD-10-CM | POA: Diagnosis not present

## 2017-08-05 DIAGNOSIS — I259 Chronic ischemic heart disease, unspecified: Secondary | ICD-10-CM | POA: Diagnosis not present

## 2017-08-05 DIAGNOSIS — I25118 Atherosclerotic heart disease of native coronary artery with other forms of angina pectoris: Secondary | ICD-10-CM

## 2017-08-05 DIAGNOSIS — Z7902 Long term (current) use of antithrombotics/antiplatelets: Secondary | ICD-10-CM | POA: Diagnosis not present

## 2017-08-05 LAB — COMPREHENSIVE METABOLIC PANEL
ALT: 13 U/L — ABNORMAL LOW (ref 17–63)
AST: 16 U/L (ref 15–41)
Albumin: 3.4 g/dL — ABNORMAL LOW (ref 3.5–5.0)
Alkaline Phosphatase: 90 U/L (ref 38–126)
Anion gap: 11 (ref 5–15)
BUN: 14 mg/dL (ref 6–20)
CO2: 25 mmol/L (ref 22–32)
Calcium: 9.4 mg/dL (ref 8.9–10.3)
Chloride: 104 mmol/L (ref 101–111)
Creatinine, Ser: 0.9 mg/dL (ref 0.61–1.24)
GFR calc Af Amer: >60 mL/min (ref >60)
GFR calc non Af Amer: >60 mL/min (ref >60)
Glucose, Bld: 111 mg/dL — ABNORMAL HIGH (ref 65–99)
Potassium: 4.4 mmol/L (ref 3.5–5.1)
Sodium: 140 mmol/L (ref 135–145)
Total Bilirubin: 1 mg/dL (ref 0.3–1.2)
Total Protein: 6.6 g/dL (ref 6.5–8.1)

## 2017-08-05 LAB — CBC WITH DIFFERENTIAL/PLATELET
Basophils Absolute: 0 10*3/uL (ref 0.0–0.1)
Basophils Relative: 0 %
Eosinophils Absolute: 0.1 10*3/uL (ref 0.0–0.7)
Eosinophils Relative: 1 %
HCT: 42.3 % (ref 39.0–52.0)
Hemoglobin: 14 g/dL (ref 13.0–17.0)
Lymphocytes Relative: 18 %
Lymphs Abs: 1 10*3/uL (ref 0.7–4.0)
MCH: 29.7 pg (ref 26.0–34.0)
MCHC: 33.1 g/dL (ref 30.0–36.0)
MCV: 89.8 fL (ref 78.0–100.0)
Monocytes Absolute: 0.4 10*3/uL (ref 0.1–1.0)
Monocytes Relative: 7 %
Neutro Abs: 4 10*3/uL (ref 1.7–7.7)
Neutrophils Relative %: 74 %
Platelets: 291 10*3/uL (ref 150–400)
RBC: 4.71 MIL/uL (ref 4.22–5.81)
RDW: 13.2 % (ref 11.5–15.5)
WBC: 5.5 10*3/uL (ref 4.0–10.5)

## 2017-08-05 LAB — I-STAT TROPONIN, ED
Troponin i, poc: 0 ng/mL (ref 0.00–0.08)
Troponin i, poc: 0 ng/mL (ref 0.00–0.08)

## 2017-08-05 LAB — D-DIMER, QUANTITATIVE: D-Dimer, Quant: 1.51 ug/mL-FEU — ABNORMAL HIGH (ref 0.00–0.50)

## 2017-08-05 MED ORDER — IOPAMIDOL (ISOVUE-370) INJECTION 76%
INTRAVENOUS | Status: AC
  Administered 2017-08-05: 100 mL
  Filled 2017-08-05: qty 100

## 2017-08-05 NOTE — ED Provider Notes (Signed)
Took over patient care from Dr. Estell HarpinZammit at 1600.  Patient was seen by cardiology and at this time did not feel that patient's symptoms were cardiac in nature.  Patient has had negative delta troponins.  Also his CT PE study was negative.  Cardiology felt the patient would be safe for discharge home.  That may be his symptoms related to deconditioning from recent neurosurgical procedure.  Discussed findings with the patient is family and they are comfortable with discharge.   Gwyneth SproutPlunkett, Hanley Rispoli, MD 08/05/17 1840

## 2017-08-05 NOTE — ED Provider Notes (Signed)
[facility]ENC[facility] Provider Note   CSN: [id number] Arrival date & time: 08/05/17  1246     History   Chief Complaint Chief Complaint  Patient presents with  . Weakness    HPI Daniel [name] is a 84 y.o. male.  Patient complains of shortness of breath with exertion.  And weakness.  He had similar symptoms before when he had some coronary artery disease   The history is provided by the patient.  Weakness  Primary symptoms include no focal weakness. This is a new problem. The current episode started 12 to 24 hours ago. The problem has not changed since onset.There was no focality noted. There has been no fever. Associated symptoms include shortness of breath and chest pain. Pertinent negatives include no headaches.    Past Medical History:  Diagnosis Date  . Arthritis   . CAD of autologous arterial graft 2010   Atretic LIMA; Myoview Jan 2015: Possible mild basal anteroseptal defect thought to be artifact (CRO early LAD ischemia due to inadeuate retrograde perfusion via SVG-Diag  . CAD S/P percutaneous coronary angioplasty June 2008;    a. 6/'08: PCI nongrafted distal RCA-PDA: Mini vision BMS 2.25 mm x 28 mm; b. Relook Cath 08/2015: Stable CAD. No culprit lesion. Widely patent PDA stent. Patent SVG-OM 2, SVG-D2. Essentially atretic/small LIMA-LAD, but the LAD is perfused via SVG-D2  . Complication of anesthesia   . Coronary artery disease involving left main coronary artery 1997-2010   Last cath 2010: 70-80 % ostial left main, 80-90% LAD, subtotal occluded circumflex, RCA now a patent stent; SVG-diagonal backfills LAD, atretic LIMA-LAD -- medical therapy; stable by relook cath in March 2017  . Difficult intubation    VERY NARROW AIRWAY PER ANESTHESIOLOGIST ([facility])  . Dyslipidemia, goal LDL below 70   . Dysrhythmia   . Heart palpitations    Never fully diagnosed.  [name]. History of kidney stones   . Hypertension   . Insomnia   . Mass in  chest    JUST WATCHING , WILL CHECK OUT AFTER SURGERY  . S/P CABG x 3 1997   LIMA-LAD, SVG-D2, SVG-OM --> LIMA known to be atretic, but SVG-D2 fills diagonal and LAD  . Stenosis of cervical spine with myelopathy   . Tingling    in fingers    Patient Active Problem List   Diagnosis Date Noted  . Stenosis of cervical spine with myelopathy 07/14/2017  . DOE (dyspnea on exertion) 03/10/2017  . Acute sinusitis 02/15/2017  . Allergic rhinitis 02/15/2017  . Preoperative cardiovascular examination 08/16/2016  . Chronic pain of both knees 07/13/2016  . Fatigue 09/11/2015  . Unstable angina (HCC) 08/06/2015  . Low TSH level 08/13/2014  . Orthostatic hypotension 12/17/2013  . Pseudoptosis 05/22/2013  . Dermatochalasis 02/21/2013  . Epiphora 02/21/2013  . CAD S/P PCI June 2008   . S/P CABG x 3   . Essential hypertension   . Dyslipidemia, goal LDL below 70   . Heart palpitations   . Nuclear cataract 01/26/2012  . PCO (posterior capsular opacification) 01/26/2012  . Pseudophakia 01/26/2012    Past Surgical History:  Procedure Laterality Date  . BACK SURGERY     decompression  lower back   . CARDIAC CATHETERIZATION  June 2008   showed high grade distal RCA and PDA stenosis and underwent PCI with a 2.25 mm x 28 mm Mini Vision bare- metal stent.  [name]. CARDIAC CATHETERIZATION  2010   Native coronaries at  70% to 80% ostial left main. LAD had diffuse 80% to 90% stenosis  . CARDIAC CATHETERIZATION N/A 08/08/2015   Procedure: Left Heart Cath and Cors/Grafts Angiography;  Surgeon: Marykay Lex, MD;  Location: [facility] INVASIVE CV LAB;  Service: Cardiovascular;  Laterality: N/A;  . COLONOSCOPY    . CORONARY ANGIOPLASTY WITH STENT PLACEMENT    . CORONARY ARTERY BYPASS GRAFT  [id number]   LIMA - LAD, SVG- diagonal, SVG-OM  . EYE SURGERY     BIL CATARACT  . KIDNEY STONE SURGERY    . KNEE ARTHROSCOPY     RT  KNEE  . NM MYOVIEW LTD  02/25/2004; Jan 2015   a) EF 57%; b)  Normal Nuclear Stress Test - No  evidence of Ischemia or Infarction. Normal LV Function & wall motion.  [name] POSTERIOR CERVICAL FUSION/FORAMINOTOMY N/A 07/14/2017   Procedure: LAMINECTOMY CERVICAL THREE- CERVICAL FOUR WITH LATERAL MASS FUSION;  Surgeon: Lisbeth Renshaw, MD;  Location: [facility] OR;  Service: Neurosurgery;  Laterality: N/A;  LAMINECTOMY CERVICAL 3- CERVICAL 4 WITH LATERAL MASS FUSION       Home Medications    Prior to Admission medications   Medication Sig Start Date End Date Taking? Authorizing Provider  acetaminophen (TYLENOL) 500 MG tablet Take 1,000 mg by mouth every 8 (eight) hours as needed for mild pain.    [provider]  azelastine (ASTELIN) 0.1 % nasal spray instill 2 sprays into each nostril twice a day 07/25/17   Kozlow, Alvira Philips, MD  budesonide (RHINOCORT AQUA) 32 MCG/ACT nasal spray Use 1 spray in each nostril once daily    [provider]  cholecalciferol (VITAMIN D) 1000 units tablet Take 1,000 Units by mouth daily.    [provider]  clopidogrel (PLAVIX) 75 MG tablet Take 1 tablet (75 mg total) by mouth daily. 07/21/17   Lisbeth Renshaw, MD  docusate sodium (COLACE) 100 MG capsule Take 200-300 mg by mouth daily as needed for mild constipation or moderate constipation.    [provider]  Histamine Dihydrochloride (AUSTRALIAN DREAM ARTHRITIS) 0.025 % CREA Apply 1 application topically as needed (for pain).    [provider]  isosorbide mononitrate (IMDUR) 30 MG 24 hr tablet Take 30 mg by mouth daily.    [provider]  lisinopril (PRINIVIL,ZESTRIL) 5 MG tablet Take 1 tablet (5 mg total) daily by mouth. 04/25/17 07/24/17  Marykay Lex, MD  Melatonin 5 MG TABS Take 5 mg by mouth at bedtime as needed (sleep).    [provider]  methocarbamol (ROBAXIN) 500 MG tablet Take 1 tablet (500 mg total) by mouth every 8 (eight) hours as needed for muscle spasms. 07/15/17   Lisbeth Renshaw, MD  metoprolol succinate (TOPROL-XL) 50 MG 24 hr tablet  Take 50-75 mg by mouth See admin instructions. 75mg  in the morning, 50mg  in the evening    [provider]  montelukast (SINGULAIR) 10 MG tablet Take 10 mg by mouth at bedtime.    [provider]  Multiple Vitamin (MULTIVITAMIN WITH MINERALS) TABS tablet Take 1 tablet by mouth daily.    [provider]  NITROSTAT 0.4 MG SL tablet place 1 tablet under the tongue every 5 minutes if needed for chest pain Patient taking differently: place 0.4 mg under the tongue every 5 minutes if needed for chest pain 02/04/17   Corine Shelter K, PA-C  oxyCODONE 10 MG TABS Take 1 tablet (10 mg total) by mouth every 6 (six) hours as needed for severe pain ((score  7 to 10)). 07/15/17   Lisbeth Renshaw, MD  tamsulosin (FLOMAX) 0.4 MG CAPS capsule Take 0.4 mg by mouth at bedtime.  06/08/14   [provider]  traMADol (ULTRAM) 50 MG tablet Take 100 mg by mouth 2 (two) times daily.     [provider]  vitamin B-12 (CYANOCOBALAMIN) 1000 MCG tablet Take 1,000 mcg by mouth 2 (two) times daily.     [provider]  zolpidem (AMBIEN) 10 MG tablet Take 10 mg by mouth at bedtime as needed for sleep.    [provider]    Family History Family History  Problem Relation Age of Onset  . Stroke Mother 73  . Heart disease Mother   . Arthritis Mother   . Heart attack Father 71  . Other Sister   . Heart disease Brother     Social History Social History   Tobacco Use  . Smoking status: Never Smoker  . Smokeless tobacco: Never Used  Substance Use Topics  . Alcohol use: No  . Drug use: No     Allergies   Penicillins; Sulfa antibiotics; and Statins   Review of Systems Review of Systems  Constitutional: Negative for appetite change and fatigue.  HENT: Negative for congestion, ear discharge and sinus pressure.   Eyes: Negative for discharge.  Respiratory: Positive for shortness of breath. Negative for cough.   Cardiovascular: Positive for chest pain.    Gastrointestinal: Negative for abdominal pain and diarrhea.  Genitourinary: Negative for frequency and hematuria.  Musculoskeletal: Negative for back pain.  Skin: Negative for rash.  Neurological: Positive for weakness. Negative for focal weakness, seizures and headaches.  Psychiatric/Behavioral: Negative for hallucinations.     Physical Exam Updated Vital Signs BP (!) 150/74   Pulse 81   Temp 97.8 F (36.6 C)   Resp 18   Ht 6\' 3"  (1.905 m)   Wt 83.5 kg (184 lb)   SpO2 99%   BMI 23.00 kg/m   Physical Exam  Constitutional: He is oriented to person, place, and time. He appears well-developed.  HENT:  Head: Normocephalic.  Eyes: Conjunctivae and EOM are normal. No scleral icterus.  Neck: Neck supple. No thyromegaly present.  Cardiovascular: Normal rate and regular rhythm. Exam reveals no gallop and no friction rub.  No murmur heard. Pulmonary/Chest: No stridor. He has no wheezes. He has no rales. He exhibits no tenderness.  Abdominal: He exhibits no distension. There is no tenderness. There is no rebound.  Musculoskeletal: Normal range of motion. He exhibits no edema.  Lymphadenopathy:    He has no cervical adenopathy.  Neurological: He is oriented to person, place, and time. He exhibits normal muscle tone. Coordination normal.  Skin: No rash noted. No erythema.  Psychiatric: He has a normal mood and affect. His behavior is normal.     ED Treatments / Results  Labs (all labs ordered are listed, but only abnormal results are displayed) Labs Reviewed  D-DIMER, QUANTITATIVE (NOT AT [facility]) - Abnormal; Notable for the following components:      Result Value   D-Dimer, Quant 1.51 (*)    All other components within normal limits  CBC WITH DIFFERENTIAL/PLATELET  COMPREHENSIVE METABOLIC PANEL  I-STAT TROPONIN, ED    EKG  EKG Interpretation None       Radiology Dg Chest Port 1 View  Result Date: 08/05/2017 CLINICAL DATA:  Shortness of breath, weakness. EXAM:  PORTABLE CHEST 1 VIEW COMPARISON:  Chest x-ray dated 06/23/2012. FINDINGS: Stable cardiomediastinal silhouette. Surgical changes  of CABG. Lungs are clear. No pleural effusion or pneumothorax seen. Stable elevation of the right hemidiaphragm. IMPRESSION: No active disease.  No evidence of pneumonia or pulmonary edema. Electronically Signed   By: Bary Richard M.D.   On: 08/05/2017 14:12    Procedures Procedures (including critical care time)  Medications Ordered in ED Medications - No data to display   Initial Impression / Assessment and Plan / ED Course  I have reviewed the triage vital signs and the nursing notes.  Pertinent labs & imaging results that were available during my care of the patient were reviewed by me and considered in my medical decision making (see chart for details).     Patient had coronary artery disease and shortness of breath.  Patient will be seen by cardiology and also get a CT Angie of his chest  Final Clinical Impressions(s) / ED Diagnoses   Final diagnoses:  None    ED Discharge Orders    None       Bethann Berkshire, MD 08/08/17 1556

## 2017-08-05 NOTE — ED Notes (Signed)
Patient transported to CT 

## 2017-08-05 NOTE — Consult Note (Addendum)
Cardiology Consultation:   Patient ID: [name]; [id number]; May 27, 1933   Admit date: 08/05/2017 Date of Consult: 08/05/2017  Primary Care Provider: Geoffry Paradise, MD Primary Cardiologist: Dr. Herbie [name]  Patient Profile:   Daniel [name] is a 84 y.o. male  patient of Dr. Herbie [name] with a history of extensive CAD s/p remote CABG in 1997 in response to left main disease, PCI to the RCA-PDA in June 2008 and subsequent cardiac caths 2010 and 2017 for mild chronic unstable anginal pain with recommendations for medical treatment given stable disease (70-80% ostial left main, 80-90% LAD, subtotal occluded circumflex, RCA-PDA with patent BMS stent, SVG-diagonal backfills LAD, atretic LIMA-LAD), hypertension, and hyperlipidemia who is being seen today for the evaluation of dyspnea on exertion at the request of Dr. Estell [name].   History of Present Illness:   Daniel [name] is a pleasant 84 year old male with a history as stated above who, according to recent office visit notes from October and November 2018, had been experiencing multiple complaints including ongoing fatigue, exertional dyspnea with some chest tightness and mild palpitations similar to previous anginal type symptoms. Dr. Herbie [name] suggested proceeding with a new ischemic evaluation with a Lexiscan Myoview given that this could be his anginal equivalent.  On March 17, 2017 the Myoview was completed with no significant reversible ischemia, LVEF 54% with normal wall motion and categorized as a low risk study.   Given his complaints of palpitations and low risk Myoview study, he subsequently wore a 48-hour Holter monitor which revealed infrequent ectopy, mostly sinus rhythm with a first-degree AV block, and PVC and PACs, otherwise stable.  It was then thought that his exertional dyspnea was most likely related to deconditioning and knee pain given no other specific etiology.  Of note, on 07/14/2017 the patient underwent an elective laminectomy with  lateral mass fusion surgery without complication. His Plavix was held for 14-days. This has been resumed.  He presented to the emergency department by EMS on 08/05/2017 with complaints of progressive generalized weakness for several weeks, thought to be exacerbated by his recent surgery. He states that he woke this morning and felt extreme weakness to the point that he did not feel as if he would make it out of bed. Additionally, he continues to have problems with exertional dyspnea, however, he denies chest pain, nausea, vomiting, palpitations, numbness or tingling in arms or hands or syncope.   In the emergency department, his iPOC troponin level was negative at 0.00. A d-dimer was drawn which is elevated at 1.51.  His EKG shows no ST-T wave abnormalities>normal sinus rhythm and heart rate 76.  Chest x-ray was performed which shows no active cardiopulmonary disease, no pulmonary edema. He has not tachycardic or tachypneic however, his BP is mildly elevated with systolic blood pressures in the 150s-160s. He is afebrile.  He is currently undergoing a chest CT given his elevated D-dimer.   Past Medical History:  Diagnosis Date  . Arthritis   . CAD of autologous arterial graft 2010   Atretic LIMA; Myoview Jan 2015: Possible mild basal anteroseptal defect thought to be artifact (CRO early LAD ischemia due to inadeuate retrograde perfusion via SVG-Diag  . CAD S/P percutaneous coronary angioplasty June 2008;    a. 6/'08: PCI nongrafted distal RCA-PDA: Mini vision BMS 2.25 mm x 28 mm; b. Relook Cath 08/2015: Stable CAD. No culprit lesion. Widely patent PDA stent. Patent SVG-OM 2, SVG-D2. Essentially atretic/small LIMA-LAD, but the LAD is perfused via SVG-D2  . Complication of  anesthesia   . Coronary artery disease involving left main coronary artery 1997-2010   Last cath 2010: 70-80 % ostial left main, 80-90% LAD, subtotal occluded circumflex, RCA now a patent stent; SVG-diagonal backfills LAD, atretic  LIMA-LAD -- medical therapy; stable by relook cath in March 2017  . Difficult intubation    VERY NARROW AIRWAY PER ANESTHESIOLOGIST (1997 CABG AT [facility])  . Dyslipidemia, goal LDL below 70   . Dysrhythmia   . Heart palpitations    Never fully diagnosed.  [name] History of kidney stones   . Hypertension   . Insomnia   . Mass in chest    JUST WATCHING , WILL CHECK OUT AFTER SURGERY  . S/P CABG x 3 1997   LIMA-LAD, SVG-D2, SVG-OM --> LIMA known to be atretic, but SVG-D2 fills diagonal and LAD  . Stenosis of cervical spine with myelopathy   . Tingling    in fingers    Past Surgical History:  Procedure Laterality Date  . BACK SURGERY     decompression  lower back   . CARDIAC CATHETERIZATION  June 2008   showed high grade distal RCA and PDA stenosis and underwent PCI with a 2.25 mm x 28 mm Mini Vision bare- metal stent.  [name] CARDIAC CATHETERIZATION  2010   Native coronaries at 70% to 80% ostial left main. LAD had diffuse 80% to 90% stenosis  . CARDIAC CATHETERIZATION N/A 08/08/2015   Procedure: Left Heart Cath and Cors/Grafts Angiography;  Surgeon: Marykay Lex, MD;  Location: [facility] INVASIVE CV LAB;  Service: Cardiovascular;  Laterality: N/A;  . COLONOSCOPY    . CORONARY ANGIOPLASTY WITH STENT PLACEMENT    . CORONARY ARTERY BYPASS GRAFT  [id number]   LIMA - LAD, SVG- diagonal, SVG-OM  . EYE SURGERY     BIL CATARACT  . KIDNEY STONE SURGERY    . KNEE ARTHROSCOPY     RT  KNEE  . NM MYOVIEW LTD  02/25/2004; Jan 2015   a) EF 57%; b)  Normal Nuclear Stress Test - No evidence of Ischemia or Infarction. Normal LV Function & wall motion.  [name] POSTERIOR CERVICAL FUSION/FORAMINOTOMY N/A 07/14/2017   Procedure: LAMINECTOMY CERVICAL THREE- CERVICAL FOUR WITH LATERAL MASS FUSION;  Surgeon: Lisbeth Renshaw, MD;  Location: [facility] OR;  Service: Neurosurgery;  Laterality: N/A;  LAMINECTOMY CERVICAL 3- CERVICAL 4 WITH LATERAL MASS FUSION     Prior to Admission medications   Medication Sig Start Date End Date Taking?  Authorizing Provider  acetaminophen (TYLENOL) 500 MG tablet Take 1,000 mg by mouth every 8 (eight) hours as needed for mild pain.    [provider]  azelastine (ASTELIN) 0.1 % nasal spray instill 2 sprays into each nostril twice a day 07/25/17   Kozlow, Alvira Philips, MD  budesonide (RHINOCORT AQUA) 32 MCG/ACT nasal spray Use 1 spray in each nostril once daily    [provider]  cholecalciferol (VITAMIN D) 1000 units tablet Take 1,000 Units by mouth daily.    [provider]  clopidogrel (PLAVIX) 75 MG tablet Take 1 tablet (75 mg total) by mouth daily. 07/21/17   Lisbeth Renshaw, MD  docusate sodium (COLACE) 100 MG capsule Take 200-300 mg by mouth daily as needed for mild constipation or moderate constipation.    [provider]  Histamine Dihydrochloride (AUSTRALIAN DREAM ARTHRITIS) 0.025 % CREA Apply 1 application topically as needed (for pain).    [provider]  isosorbide mononitrate (IMDUR) 30 MG 24 hr tablet Take 30 mg by  mouth daily.    [provider]  lisinopril (PRINIVIL,ZESTRIL) 5 MG tablet Take 1 tablet (5 mg total) daily by mouth. 04/25/17 07/24/17  Marykay Lex, MD  Melatonin 5 MG TABS Take 5 mg by mouth at bedtime as needed (sleep).    [provider]  methocarbamol (ROBAXIN) 500 MG tablet Take 1 tablet (500 mg total) by mouth every 8 (eight) hours as needed for muscle spasms. 07/15/17   Lisbeth Renshaw, MD  metoprolol succinate (TOPROL-XL) 50 MG 24 hr tablet Take 50-75 mg by mouth See admin instructions. 75mg  in the morning, 50mg  in the evening    [provider]  montelukast (SINGULAIR) 10 MG tablet Take 10 mg by mouth at bedtime.    [provider]  Multiple Vitamin (MULTIVITAMIN WITH MINERALS) TABS tablet Take 1 tablet by mouth daily.    [provider]  NITROSTAT 0.4 MG SL tablet place 1 tablet under the tongue every 5 minutes if needed for chest pain Patient taking differently: place 0.4  mg under the tongue every 5 minutes if needed for chest pain 02/04/17   Corine Shelter K, PA-C  oxyCODONE 10 MG TABS Take 1 tablet (10 mg total) by mouth every 6 (six) hours as needed for severe pain ((score 7 to 10)). 07/15/17   Lisbeth Renshaw, MD  tamsulosin (FLOMAX) 0.4 MG CAPS capsule Take 0.4 mg by mouth at bedtime.  06/08/14   [provider]  traMADol (ULTRAM) 50 MG tablet Take 100 mg by mouth 2 (two) times daily.     [provider]  vitamin B-12 (CYANOCOBALAMIN) 1000 MCG tablet Take 1,000 mcg by mouth 2 (two) times daily.     [provider]  zolpidem (AMBIEN) 10 MG tablet Take 10 mg by mouth at bedtime as needed for sleep.    [provider]    Inpatient Medications: Scheduled Meds:  Continuous Infusions:  PRN Meds:   Allergies:    Allergies  Allergen Reactions  . Penicillins Swelling and Other (See Comments)    Swelling and redness in joints  Has patient had a PCN reaction causing immediate rash, facial/tongue/throat swelling, SOB or lightheadedness with hypotension: No Has patient had a PCN reaction causing severe rash involving mucus membranes or skin necrosis: No Has patient had a PCN reaction that required hospitalization: No Has patient had a PCN reaction occurring within the last 10 years: No If all of the above answers are "NO", then may proceed with Cephalosporin use.   . Sulfa Antibiotics Other (See Comments)    Pain in left side underneath ribcage-pancreas  . Statins Other (See Comments)    UNSPECIFIED REACTION  "INTOLERANCE AT HIGH DOSES"    Social History:   Social History   Socioeconomic History  . Marital status: Married    Spouse name: Cordelia Pen  . Number of children: 2  . Years of education: 77  . Highest education level: Not on file  Social Needs  . Financial resource strain: Not on file  . Food insecurity - worry: Not on file  . Food insecurity - inability: Not on file  . Transportation needs - medical: Not on  file  . Transportation needs - non-medical: Not on file  Occupational History  . Occupation: retired Psychologist, educational  Tobacco Use  . Smoking status: Never Smoker  . Smokeless tobacco: Never Used  Substance and Sexual Activity  . Alcohol use: No  . Drug use: No  . Sexual activity: Not on file  Other Topics Concern  .  Not on file  Social History Narrative   He is a married father of 2, grandfather 5. He is retired from [employer], Psychologist, educational. 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  He is usually very active, but is limited as far as standing exercise by his knee osteoarthritis.   He does not drink and does not smoke. Never smoked.  Live with wife in a one story home.  Education: college.     Family History:   Family History  Problem Relation Age of Onset  . Stroke Mother 57  . Heart disease Mother   . Arthritis Mother   . Heart attack Father 84  . Other Sister   . Heart disease Brother    Family Status:  Family Status  Relation Name Status  . Mother  Deceased  . Father  Deceased  . Sister  Deceased  . Brother  Deceased    ROS:  Please see the history of present illness.  All other ROS reviewed and negative.     Physical Exam/Data:   Vitals:   08/05/17 1330 08/05/17 1415 08/05/17 1445 08/05/17 1500  BP: (!) 143/79 (!) 156/79 (!) 155/88 (!) 150/74  Pulse: 72 76 76 81  Resp: 13 12 16 18   Temp:      SpO2: 98% 100% 96% 99%  Weight:      Height:       No intake or output data in the 24 hours ending 08/05/17 1529 Filed Weights   08/05/17 1256  Weight: 184 lb (83.5 kg)   Body mass index is 23 kg/m.   General: Elderly, frail, NAD Skin: Warm, dry, intact  Head: Normocephalic, atraumatic, clear, moist mucus membranes. Neck: Negative for carotid bruits. No JVD Lungs:Clear to ausculation bilaterally. No wheezes, rales, or rhonchi. Breathing is unlabored. Cardiovascular: RRR with S1 S2. No  murmurs, rubs, or gallops Abdomen: Soft, non-tender, non-distended with normoactive bowel sounds. No obvious abdominal masses. MSK: Strength and tone appear normal for age. 5/5 in all extremities Extremities: No edema. No clubbing or cyanosis. DP/PT pulses 2+ bilaterally Neuro: Alert and oriented. No focal deficits. No facial asymmetry. MAE spontaneously. Psych: Responds to questions appropriately with normal affect.     EKG:  The EKG was personally reviewed and demonstrates:  08/05/17 NSR with PVC Telemetry:  Telemetry was personally reviewed and demonstrates:  08/05/17 NSR with PVC's  Relevant CV Studies:  ECHO: NA  CATH: 08/08/15 1. Ost LM to LM lesion, 80% stenosed. LM-Ostial LAD lesion, 70-80% stenosed. Prox LAD to Mid LAD lesion, 100% stenosed after small D1. 2. Ost 2nd Mrg lesion, 100% stenosed. Very small caliber AV Groove Circumflex remains after OM1. 3. Prox RCA lesion, 20% stenosed. Dist RCA lesion, 20% stenosed. Widely patent PDA stent 4. SVG-OM2 was injected is large, and is anatomically normal. 5. SVG-D2 was injected is large, and is anatomically normal. Antegrade flow fills a moderate caliber diagonal vessel with minimal disease. Retrograde flow fills the native LAD with TIMI 3 flow. 6. LIMA-LAD was injected is small and diffusely Atretic. It almost does not reach the LAD, and is noted to have significant competetive flow from the Retrograde SVG-Diag flow. 7. The left ventricular systolic function is normal. 8. Systemic Hypertension with Mildly elevated LVEDP    Daniel [name] has stable coronary disease from his last catheterization in 2010. No culprit lesion is found. I suspect that his symptoms could be related to microvascular disease versus  diastolic dysfunction. He'll need continued blood pressure management and consider low-dose diuretic in clinic follow-up.  Plan:  Standard post radial cath care with TR band removal. He'll be discharged following TR band removal and  bed rest.  He should've follow-up scheduled with either me or Corine Shelter, PA-C.  For now will continue current medications. We'll need to reassess his blood pressure control and volume status as an outpatient.   Myoview Stress Test 03/17/17:  The left ventricular ejection fraction is mildly decreased (45-54%).  Nuclear stress EF: 54%.  No T wave inversion was noted during stress.  There was no ST segment deviation noted during stress.  TID 1.34   No significant reversible ischemia. Increased TID ratio 1.34. LVEF 54% with normal wall motion. This is a low risk study.   Laboratory Data:  ChemistryNo results for input(s): NA, K, CL, CO2, GLUCOSE, BUN, CREATININE, CALCIUM, GFRNONAA, GFRAA, ANIONGAP in the last 168 hours.  Total Protein  Date Value Ref Range Status  05/27/2017 7.5 6.5 - 8.1 g/dL Final   Albumin  Date Value Ref Range Status  05/27/2017 3.7 3.5 - 5.0 g/dL Final   AST  Date Value Ref Range Status  05/27/2017 22 15 - 41 U/L Final   ALT  Date Value Ref Range Status  05/27/2017 16 (L) 17 - 63 U/L Final   Alkaline Phosphatase  Date Value Ref Range Status  05/27/2017 86 38 - 126 U/L Final   Total Bilirubin  Date Value Ref Range Status  05/27/2017 0.9 0.3 - 1.2 mg/dL Final   Hematology Recent Labs  Lab 08/05/17 1341  WBC 5.5  RBC 4.71  HGB 14.0  HCT 42.3  MCV 89.8  MCH 29.7  MCHC 33.1  RDW 13.2  PLT 291   Cardiac EnzymesNo results for input(s): TROPONINI in the last 168 hours.  Recent Labs  Lab 08/05/17 1407  TROPIPOC 0.00    BNPNo results for input(s): BNP, PROBNP in the last 168 hours.  DDimer  Recent Labs  Lab 08/05/17 1341  DDIMER 1.51*   TSH:  Lab Results  Component Value Date   TSH 0.20 (L) 05/02/2017   Lipids: Lab Results  Component Value Date   CHOL (H) 11/13/2008    207        ATP III CLASSIFICATION:  <200     mg/dL   Desirable  161-096  mg/dL   Borderline High  >=045    mg/dL   High          HDL 33 (L)  11/13/2008   LDLCALC (H) 11/13/2008    151        Total Cholesterol/HDL:CHD Risk Coronary Heart Disease Risk Table                     Men   Women  1/2 Average Risk   3.4   3.3  Average Risk       5.0   4.4  2 X Average Risk   9.6   7.1  3 X Average Risk  23.4   11.0        Use the calculated Patient Ratio above and the CHD Risk Table to determine the patient's CHD Risk.        ATP III CLASSIFICATION (LDL):  <100     mg/dL   Optimal  409-811  mg/dL   Near or Above  Optimal  130-159  mg/dL   Borderline  161-096160-189  mg/dL   High  >045>190     mg/dL   Very High   TRIG 409117 11/13/2008   CHOLHDL 6.3 11/13/2008   HgbA1c: Lab Results  Component Value Date   HGBA1C  11/12/2008    5.9 (NOTE) The ADA recommends the following therapeutic goal for glycemic control related to Hgb A1c measurement: Goal of therapy: <6.5 Hgb A1c  Reference: American Diabetes Association: Clinical Practice Recommendations 2010, Diabetes Care, 2010, 33: (Suppl  1).    Radiology/Studies:  Dg Chest Port 1 View  Result Date: 08/05/2017 CLINICAL DATA:  Shortness of breath, weakness. EXAM: PORTABLE CHEST 1 VIEW COMPARISON:  Chest x-ray dated 06/23/2012. FINDINGS: Stable cardiomediastinal silhouette. Surgical changes of CABG. Lungs are clear. No pleural effusion or pneumothorax seen. Stable elevation of the right hemidiaphragm. IMPRESSION: No active disease.  No evidence of pneumonia or pulmonary edema. Electronically Signed   By: Bary RichardStan  Maynard M.D.   On: 08/05/2017 14:12    Assessment and Plan:   1.  Dyspnea on exertion: -Elevated D-dimer>>>currently undergoing chest CT -Trop, negative at 0.0>>if trops continue to be negative, no need to admit from a cardiology perspective -EKG without ST-T wave changes -CXR without acute cardiopulmonary disease -Most recent Lexiscan Myoview negative for ischemic changes, low risk 03/2017 -Last cath 2017 with no change in CAD disease from previous cath in 2010-see  reports -Does not appear to be anginal in nature>>>agree with EDP for chest CT given elevated D-dimer -Given recent laminectomy earlier this month, would also consider deconditioning as a prime component   2.  CAD status post CABG x3 (SVG to OM, SVG to diagonal 2, LIMA to LAD): -Trop negative, continue to cycle? -EKG unremarkable -Denies chest pain>>>has been having ongoing weakness, fatigue, and dyspnea -No recent echo>>>consider ordering -Last EF evaluation>>>per stress study EF 55% -Plavix  3.  Hypertension: -Elevated, 150/80>150/74>155/88 -lisinopril, Toprol, Imdur  4.  Hyperlipidemia: -Allergy to statins    5. Elevated D-dimer: -D-dimer, 1.51>>>currenlty undergoing chest CT    For questions or updates, please contact [facility] Please consult [email or web address] for contact info under Cardiology/STEMI.   Raliegh IpSigned, Jill McDaniel NP-C [facility] Pager: [phone] 08/05/2017 3:29 PM   I have examined the patient and reviewed assessment and plan and discussed with patient.  Agree with above as stated.  Sx seem more like DOE from deconditioning.  He had recent neck surgery which limited his activity.  No PE by CT.  Troponin negative x 2.  Overall, he feels better.  He is going to start some more activity since the pain of his surgery is getting better. This may help with his SHOB.  He already has f/u with Dr. Herbie BaltimoreHarding on March 12.  OK to discharge from a cardiac standpoint.  He should return if he has chest pain or sx like he had with his ischemic heart disease.   Lance MussJayadeep Michaeline Eckersley

## 2017-08-05 NOTE — ED Triage Notes (Addendum)
Pt brought in by [facility] from home for generalized weakness that has progressed for several weeks. Pt endorses SOB with exertion, denies NVD. Pt has cardiac hx, took 324mg  aspirin and x1 nitro prior to EMS arrival. Pt denies CP. Pt is A+Ox4. Pt states he has sx on his neck x3 weeks ago and feels that his weakness began after the procedure.

## 2017-08-16 ENCOUNTER — Ambulatory Visit: Payer: Medicare PPO | Admitting: Cardiology

## 2017-08-16 ENCOUNTER — Encounter: Payer: Self-pay | Admitting: Cardiology

## 2017-08-16 VITALS — BP 128/70 | HR 61 | Ht 75.0 in | Wt 186.0 lb

## 2017-08-16 DIAGNOSIS — Z9861 Coronary angioplasty status: Secondary | ICD-10-CM | POA: Diagnosis not present

## 2017-08-16 DIAGNOSIS — R0609 Other forms of dyspnea: Secondary | ICD-10-CM

## 2017-08-16 DIAGNOSIS — E785 Hyperlipidemia, unspecified: Secondary | ICD-10-CM

## 2017-08-16 DIAGNOSIS — I2510 Atherosclerotic heart disease of native coronary artery without angina pectoris: Secondary | ICD-10-CM

## 2017-08-16 DIAGNOSIS — I951 Orthostatic hypotension: Secondary | ICD-10-CM

## 2017-08-16 DIAGNOSIS — I10 Essential (primary) hypertension: Secondary | ICD-10-CM | POA: Diagnosis not present

## 2017-08-16 DIAGNOSIS — R002 Palpitations: Secondary | ICD-10-CM | POA: Diagnosis not present

## 2017-08-16 MED ORDER — METOPROLOL SUCCINATE ER 50 MG PO TB24: 50.0000 mg | ORAL_TABLET | Freq: Two times a day (BID) | ORAL | 3 refills | Status: DC

## 2017-08-16 NOTE — Patient Instructions (Addendum)
MEDICATION INSTRUCTION  DECREASE  METOPROLOL  TARTRATE 50 MG  TWICE A DAY BEDTIME  IF  PALPITATION IF OKAY  FOR ONE MONTH   THEN CHANGE TO 100 MG  AT  BEDTIME     Your physician wants you to follow-up in 6 MONTHS WITH DR HARDING. You will receive a reminder letter in the mail two months in advance. If you don't receive a letter, please call our office to schedule the follow-up appointment.    If you need a refill on your cardiac medications before your next appointment, please call your pharmacy.

## 2017-08-16 NOTE — Progress Notes (Signed)
PCP: Geoffry Paradise, MD  Clinic Note: Chief Complaint  Patient presents with  . Follow-up    3 months; hospital follow-up  . Coronary Artery Disease    Fatigued, dizzy, weak, with orthostatic hypotension    HPI: Daniel [name] is a 84 y.o. male with a PMH below who presents today for hospital f/u --has a history of coronary disease, orthostatic hypotension in the setting of labile hypertension. [name] He is a former patient Daniel [name] with a distant history of CAD  1997: Multivessel CAD-CABG for LM CAD -- evaluation was for exertional angina.   June 2008: PTCA of rPDA for what sounded like acute CHF Sx - dyspnea, no angina.   He has a known atretic LIMA but the LAD fills via the SVG-D2.   He had mild chronic angina in the past, but was evaluated by cardiac catheterization showing stable disease in 2017.  Daniel [name] was last seen on April 25, 2017 for preop evaluation --> still noted exertional dyspnea and fatigue but more likely related to his legs giving out then angina or dyspnea.  He just had a Myoview done that was negative.  He was unable to reduce his beta-blocker down the 50 twice daily.  Noted palpitations. --Supposed to have replacement surgery, but this was canceled.  He had been following with neurology for neuropathy symptoms of weakness in his hands.  He ended up having neck surgery.  Next CT showed C3-4 disc space with a broad based disc protrusion abutting the ventral thecal sac.  Significant buckling of the ligamentum flavum causing indentation of the spinal cord with severe stenosis at the level. ->  Referred for surgery by Daniel [name]  Recent Hospitalizations:  July 14, 2017 -admitted post C3-4 laminectomy for decompression with arthrodesis.  Studies Personally Reviewed - (if available, images/films reviewed: From Epic Chart or Care Everywhere)  No new studies  Interval History: Daniel [name] returns today looking more frail and weak than he did  last I saw him.  He is now walking with a walker with a relatively slow gait.  Indicates he is lost about 20 pounds over the past several months.  He just has been quite weak.  He had been having had several falls and fractures prior to his neck surgery and has had a couple since but mostly because of losing balance.  He is finally now starting back on his feet postop.  He continues to note progressive fatigue but also has had some very labile blood pressures some being up in the high numbers of 190s and then some down the low 100s.  Somewhat disconcerting.  He continues to deny any chest tightness or pressure with rest or exertion, does have exertional dyspnea if he overdoes it.  He walks with a walker now.  He also has fatigue with activity He denies any PND, orthopnea or edema.   He had been having issues with palpitations before had not been able to wean down his beta-blocker, but now he is feeling quite fatigued and is not really noted that much of any palpitations.  No syncope/near syncope or TIA/amaurosis fugax.  ROS: A comprehensive was performed. Review of Systems  Constitutional: Positive for malaise/fatigue and weight loss.  HENT: Negative for congestion and nosebleeds.   Respiratory: Positive for shortness of breath (More fatigue and dyspnea). Negative for cough.   Gastrointestinal: Positive for heartburn. Negative for blood in stool.  Genitourinary: Negative for dysuria, hematuria and urgency.  Musculoskeletal: Positive for  joint pain (Knee still hurting quite a bit because he has not had surgery) and myalgias.  Neurological: Positive for dizziness. Negative for focal weakness.  Endo/Heme/Allergies: Bruises/bleeds easily.  Psychiatric/Behavioral: Positive for depression. Negative for memory loss. The patient is nervous/anxious.   All other systems reviewed and are negative.   I have reviewed and (if needed) personally updated the patient's problem list, medications, allergies, past  medical and surgical history, social and family history.   Past Medical History:  Diagnosis Date  . Arthritis   . CAD of autologous arterial graft 2010   Atretic LIMA; Myoview Jan 2015: Possible mild basal anteroseptal defect thought to be artifact (CRO early LAD ischemia due to inadeuate retrograde perfusion via SVG-Diag  . CAD S/P percutaneous coronary angioplasty June 2008;    a. 6/'08: PCI nongrafted distal RCA-PDA: Mini vision BMS 2.25 mm x 28 mm; b. Relook Cath 08/2015: Stable CAD. No culprit lesion. Widely patent PDA stent. Patent SVG-OM 2, SVG-D2. Essentially atretic/small LIMA-LAD, but the LAD is perfused via SVG-D2  . Complication of anesthesia   . Coronary artery disease involving left main coronary artery 1997-2010   Last cath 2010: 70-80 % ostial left main, 80-90% LAD, subtotal occluded circumflex, RCA now a patent stent; SVG-diagonal backfills LAD, atretic LIMA-LAD -- medical therapy; stable by relook cath in March 2017  . Difficult intubation    VERY NARROW AIRWAY PER ANESTHESIOLOGIST (1997 CABG AT [facility])  . Dyslipidemia, goal LDL below 70   . Dysrhythmia   . Heart palpitations    Never fully diagnosed.  [name] History of kidney stones   . Hypertension   . Insomnia   . Mass in chest    JUST WATCHING , WILL CHECK OUT AFTER SURGERY  . S/P CABG x 3 1997   LIMA-LAD, SVG-D2, SVG-OM --> LIMA known to be atretic, but SVG-D2 fills diagonal and LAD  . Stenosis of cervical spine with myelopathy   . Tingling    in fingers    Past Surgical History:  Procedure Laterality Date  . 48-HOUR MONITOR  03/2017   Relatively normal.  Very infrequent ectopy.  Short PAT run with occasional PACs/PVCs and bigeminy.  No symptoms noted on monitor.  Longest PAT runs were less than 10 beats.  [name] BACK SURGERY     decompression  lower back   . CARDIAC CATHETERIZATION  June 2008   showed high grade distal RCA and PDA stenosis and underwent PCI with a 2.25 mm x 28 mm Mini Vision bare- metal stent.  [name]  CARDIAC CATHETERIZATION  2010   Native coronaries at 70% to 80% ostial left main. LAD had diffuse 80% to 90% stenosis  . CARDIAC CATHETERIZATION N/A 08/08/2015   Procedure: Left Heart Cath and Cors/Grafts Angiography;  Surgeon: Daniel Lex, MD;  Location: [facility] INVASIVE CV LAB;  Service: Cardiovascular;  Laterality: N/A;  . COLONOSCOPY    . CORONARY ANGIOPLASTY WITH STENT PLACEMENT    . CORONARY ARTERY BYPASS GRAFT  [id number]   LIMA - LAD, SVG- diagonal, SVG-OM  . EYE SURGERY     BIL CATARACT  . KIDNEY STONE SURGERY    . KNEE ARTHROSCOPY     RT  KNEE  . NM MYOVIEW LTD  02/25/2004; Jan 2015   a) EF 57%; b)  Normal Nuclear Stress Test - No evidence of Ischemia or Infarction. Normal LV Function & wall motion.  [name] NM MYOVIEW LTD  03/17/2017   No significant reversible ischemia. Increased TID ratio 1.34. LVEF 54%  with normal wall motion. This is a low risk study.  [name] POSTERIOR CERVICAL FUSION/FORAMINOTOMY N/A 07/14/2017   Procedure: LAMINECTOMY CERVICAL THREE- CERVICAL FOUR WITH LATERAL MASS FUSION;  Surgeon: Lisbeth Renshaw, MD;  Location: [facility] OR;  Service: Neurosurgery;  Laterality: N/A;  LAMINECTOMY CERVICAL 3- CERVICAL 4 WITH LATERAL MASS FUSION    Current Meds  Medication Sig  . acetaminophen (TYLENOL) 500 MG tablet Take 1,000 mg by mouth every 8 (eight) hours as needed for mild pain.  [name] azelastine (ASTELIN) 0.1 % nasal spray instill 2 sprays into each nostril twice a day  . budesonide (RHINOCORT AQUA) 32 MCG/ACT nasal spray Use 1 spray in each nostril once daily  . cholecalciferol (VITAMIN D) 1000 units tablet Take 1,000 Units by mouth daily.  . clopidogrel (PLAVIX) 75 MG tablet Take 1 tablet (75 mg total) by mouth daily.  [name] docusate sodium (COLACE) 100 MG capsule Take 200-300 mg by mouth daily as needed for mild constipation or moderate constipation.  [name] Histamine Dihydrochloride (AUSTRALIAN DREAM ARTHRITIS) 0.025 % CREA Apply 1 application topically as needed (for pain).  . isosorbide  mononitrate (IMDUR) 30 MG 24 hr tablet Take 30 mg by mouth daily.  . Melatonin 5 MG TABS Take 5 mg by mouth at bedtime as needed (sleep).  . methocarbamol (ROBAXIN) 500 MG tablet Take 1 tablet (500 mg total) by mouth every 8 (eight) hours as needed for muscle spasms.  . metoprolol succinate (TOPROL-XL) 50 MG 24 hr tablet Take 1 tablet (50 mg total) by mouth 2 (two) times daily.  . montelukast (SINGULAIR) 10 MG tablet Take 10 mg by mouth at bedtime.  . Multiple Vitamin (MULTIVITAMIN WITH MINERALS) TABS tablet Take 1 tablet by mouth daily.  [name] NITROSTAT 0.4 MG SL tablet place 1 tablet under the tongue every 5 minutes if needed for chest pain (Patient taking differently: place 0.4 mg under the tongue every 5 minutes if needed for chest pain)  . tamsulosin (FLOMAX) 0.4 MG CAPS capsule Take 0.4 mg by mouth at bedtime.   . traMADol (ULTRAM) 50 MG tablet Take 100 mg by mouth 2 (two) times daily.   . vitamin B-12 (CYANOCOBALAMIN) 1000 MCG tablet Take 1,000 mcg by mouth 2 (two) times daily.   [name] zolpidem (AMBIEN) 10 MG tablet Take 10 mg by mouth at bedtime as needed for sleep.  . [DISCONTINUED] metoprolol succinate (TOPROL-XL) 50 MG 24 hr tablet Take 50-75 mg by mouth See admin instructions. 75mg  in the morning, 50mg  in the evening  . [DISCONTINUED] oxyCODONE 10 MG TABS Take 1 tablet (10 mg total) by mouth every 6 (six) hours as needed for severe pain ((score 7 to 10)).    Allergies  Allergen Reactions  . Penicillins Swelling and Other (See Comments)    Swelling and redness in joints  Has patient had a PCN reaction causing immediate rash, facial/tongue/throat swelling, SOB or lightheadedness with hypotension: No Has patient had a PCN reaction causing severe rash involving mucus membranes or skin necrosis: No Has patient had a PCN reaction that required hospitalization: No Has patient had a PCN reaction occurring within the last 10 years: No If all of the above answers are "NO", then may proceed with  Cephalosporin use.   . Sulfa Antibiotics Other (See Comments)    Pain in left side underneath ribcage-pancreas  . Statins Other (See Comments)    UNSPECIFIED REACTION  "INTOLERANCE AT HIGH DOSES"    Social History   Tobacco Use  . Smoking status: Never Smoker  .  Smokeless tobacco: Never Used  Substance Use Topics  . Alcohol use: No  . Drug use: No   Social History   Social History Narrative   He is a married father of 2, grandfather 5. He is retired from [employer], Psychologist, educationalexecutive. 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  He is usually very active, but is limited as far as standing exercise by his knee osteoarthritis.   He does not drink and does not smoke. Never smoked.  Live with wife in a one story home.  Education: college.     family history includes Arthritis in his mother; Heart attack (age of onset: 7475) in his father; Heart disease in his brother and mother; Other in his sister; Stroke (age of onset: 6898) in his mother.  Wt Readings from Last 3 Encounters:  08/16/17 186 lb (84.4 kg)  08/05/17 184 lb (83.5 kg)  07/14/17 190 lb (86.2 kg)    PHYSICAL EXAM BP 128/70   Pulse 61   Ht 6\' 3"  (1.905 m)   Wt 186 lb (84.4 kg)   BMI 23.25 kg/m  Physical Exam  Constitutional: He is oriented to person, place, and time. He appears well-developed and well-nourished.  Tall and thin now.  It has definitely lost weight and looks more frail.  No acute distress, just appears older than he had.  HENT:  Head: Normocephalic and atraumatic.  Neck: No hepatojugular reflux and no JVD present. Carotid bruit is not present.  Cardiovascular: Normal rate, regular rhythm, normal heart sounds and normal pulses.  No extrasystoles are present. PMI is not displaced. Exam reveals no gallop.  No murmur heard. Pulmonary/Chest: Effort normal and breath sounds normal. No respiratory distress. He has no wheezes. He has no  rales.  Abdominal: Soft. Bowel sounds are normal. He exhibits no distension. There is no tenderness. There is no rebound.  Musculoskeletal: Normal range of motion. He exhibits no edema.  Neurological: He is alert and oriented to person, place, and time.  Psychiatric: He has a normal mood and affect. His behavior is normal. Judgment and thought content normal.  Nursing note and vitals reviewed.  Adult ECG Report Not checked   Other studies Reviewed: Additional studies/ records that were reviewed today include:  Recent Labs:   Lab Results  Component Value Date   CREATININE 0.90 08/05/2017   BUN 14 08/05/2017   NA 140 08/05/2017   K 4.4 08/05/2017   CL 104 08/05/2017   CO2 25 08/05/2017    ASSESSMENT / PLAN: Problem List Items Addressed This Visit    Orthostatic hypotension - Primary (Chronic)    Very difficult situation.  He has significant orthostatic hypotension with labile blood pressures.  In the past we had reduced his lisinopril dose, my inclination would be did move toward fully stopping it.  We simply need to live with permissive hypertension. Because of his fatigue I am also going back off on his metoprolol to 50 twice daily.  I would hope to try to switch it to 100 mg nightly. If his blood pressures continue to run low I would stop his lisinopril fully. Continue Imdur for antianginal effect, is that this does not initially affect his blood pressure. He is not on diuretic and has been euvolemic.  Encourage adequate hydration.      Relevant Medications   metoprolol succinate (TOPROL-XL) 50 MG 24 hr tablet   Heart palpitations (Chronic)    Apparently  we had to go back up on his metoprolol dose last time.  My goal will be to switch him eventually to 100 mg nightly and not cover all day.  Hopefully this would help his energy levels during the day.  He had no evidence of any arrhythmias besides short bursts of PAT.  There was no sign of arrhythmia thankfully.         Essential hypertension (Chronic)    Labile blood pressures.  He has had low pressures of late.  Currently looks great.  For now I am okay with holding lisinopril at current dose, but if his blood pressures continue to run low off and on we will stop lisinopril. Continue Toprol but reduce to 50 mg twice daily and if tolerated would switch to  taken 400mg  every afternoon/at bedtime.  Would then continue without ACE inhibitor potentially as well.  Allow permissive hypertension. May need to have a short acting as needed med for blood pressures over 170.      Relevant Medications   metoprolol succinate (TOPROL-XL) 50 MG 24 hr tablet   Dyslipidemia, goal LDL below 70 (Chronic)    He had been on rosuvastatin, but this is no longer listed.  At this point, if his  lipids look okay, we can see how he does without it.  But unfortunately I have not seen recent lipids.      Relevant Medications   metoprolol succinate (TOPROL-XL) 50 MG 24 hr tablet   DOE (dyspnea on exertion) (Chronic)    Negative Myoview argues against anginal equivalent.  Probably related to deconditioning with his knee pain.  Unfortunately he may have some diastolic dysfunction which we cannot really treat due to his orthostatic hypotension.      CAD S/P PCI June 2008 (Chronic)    Long-standing, distant history of CAD.  He is LAD is fed via the vein graft to the diagonal since the LIMA is atretic.  He really is not having any anginal symptoms.  Remains on low-dose Imdur along with Plavix for his PTCA.  I am reducing his beta-blocker dose.  No longer on statin.      Relevant Medications   metoprolol succinate (TOPROL-XL) 50 MG 24 hr tablet      Current medicines are reviewed at length with the patient today. (+/- concerns) fatigued  The following changes have been made: see below.   Patient Instructions  MEDICATION INSTRUCTION  DECREASE  METOPROLOL  TARTRATE 50 MG  TWICE A DAY BEDTIME  IF  PALPITATION IF OKAY  FOR ONE MONTH    THEN CHANGE TO 100 MG  AT  BEDTIME     Your physician wants you to follow-up in 6 MONTHS WITH DR Avey Mcmanamon. You will receive a reminder letter in the mail two months in advance. If you don't receive a letter, please call our office to schedule the follow-up appointment.    If you need a refill on your cardiac medications before your next appointment, please call your pharmacy.     Studies Ordered:   No orders of the defined types were placed in this encounter.     Bryan Lemma, M.D., M.S. Interventional Cardiologist   Pager # [phone] Phone # [phone] [address] [place], [place] [address]   Thank you for choosing [facility]care at [facility]!!

## 2017-08-18 ENCOUNTER — Telehealth: Payer: Self-pay | Admitting: Cardiology

## 2017-08-18 NOTE — Telephone Encounter (Signed)
Pt hung up

## 2017-08-18 NOTE — Telephone Encounter (Signed)
So I do not have that he is taking losartan.  I have that he was on lisinopril 5 mg, but that was not listed as a medication on his last clinic visit.  I would be fine not having either losartan or lisinopril.  We had reduced his beta-blocker to 50 mg twice daily, but if he is feeling lightheaded dizzy, by all means he should not take his dose of Toprol that morning. When he does feel lightheaded and dizzy the best thing to do is sit down and drink a bottle of water and eat a snack.  The only medicine the controlled blood pressure that I would like him to be on is the metoprolol.  Bryan Lemmaavid Jupiter Kabir, MD

## 2017-08-18 NOTE — Telephone Encounter (Signed)
Pt states that he took his wife to her cancer center appt this am and states that he "didn't feel so good" and asked them to take his BP it was 94/60 he states that he feels very fatigued he states that he discussed this with Dr Herbie BaltimoreHarding at his appt 08-16-17 but states that today it is worse. And his BP is lowtoday. He states that he felt a little better yesterday but worse today. He states that he is taking his medications as ordered including the Losartan and would like to know if he should stop it. Informed pt to continue medication until we are directed by Dr Herbie BaltimoreHarding. He states that he will await the call

## 2017-08-18 NOTE — Telephone Encounter (Signed)
SPOKE TO PATIENT .  PATIENT AWARE TO STOP LISINOPRIL DUE LOW BLOOD PRESSURE PER DR HARDING.  VERBALIZED UNDERSTANDING INSTRUCTION. MAY HOLD METOPROLOL ( NEXT DOSE THAT IS SCHEDULE) IF DIZZINESS OCCUR.   ALSO  DRINK A GLASS OF WATER BEFORE YOU  GET OUT OF BED IN THE MORNING.

## 2017-08-18 NOTE — Telephone Encounter (Signed)
New message     Pt c/o BP issue: STAT if pt c/o blurred vision, one-sided weakness or slurred speech  1. What are your last 5 BP readings?  94/60   2. Are you having any other symptoms (ex. Dizziness, headache, blurred vision, passed out)?  Dizzy lighthead felt like he is going to pass out  3. What is your BP issue?  Energy level is low, bp has been running low for couple days

## 2017-08-19 ENCOUNTER — Encounter: Payer: Self-pay | Admitting: Cardiology

## 2017-08-19 NOTE — Assessment & Plan Note (Signed)
Long-standing, distant history of CAD.  He is LAD is fed via the vein graft to the diagonal since the LIMA is atretic.  He really is not having any anginal symptoms.  Remains on low-dose Imdur along with Plavix for his PTCA.  I am reducing his beta-blocker dose.  No longer on statin.

## 2017-08-19 NOTE — Assessment & Plan Note (Signed)
Further backing down beta-blocker to 50 twice daily.  We reduced his lisinopril, and now he appears to be on very low-dose at 5 mg.  We may need to completely stop ACE inhibitor if he continues to have hypotension issues.  We do have room to cut down to 2.5 however.

## 2017-08-19 NOTE — Assessment & Plan Note (Signed)
Apparently we had to go back up on his metoprolol dose last time.  My goal will be to switch him eventually to 100 mg nightly and not cover all day.  Hopefully this would help his energy levels during the day.  He had no evidence of any arrhythmias besides short bursts of PAT.  There was no sign of arrhythmia thankfully.

## 2017-08-19 NOTE — Assessment & Plan Note (Signed)
Negative Myoview argues against anginal equivalent.  Probably related to deconditioning with his knee pain.  Unfortunately he may have some diastolic dysfunction which we cannot really treat due to his orthostatic hypotension.

## 2017-08-19 NOTE — Assessment & Plan Note (Signed)
Labile blood pressures.  He has had low pressures of late.  Currently looks great.  For now I am okay with holding lisinopril at current dose, but if his blood pressures continue to run low off and on we will stop lisinopril. Continue Toprol but reduce to 50 mg twice daily and if tolerated would switch to  taken 400mg  every afternoon/at bedtime.  Would then continue without ACE inhibitor potentially as well.  Allow permissive hypertension. May need to have a short acting as needed med for blood pressures over 170.

## 2017-08-19 NOTE — Assessment & Plan Note (Signed)
Very difficult situation.  He has significant orthostatic hypotension with labile blood pressures.  In the past we had reduced his lisinopril dose, my inclination would be did move toward fully stopping it.  We simply need to live with permissive hypertension. Because of his fatigue I am also going back off on his metoprolol to 50 twice daily.  I would hope to try to switch it to 100 mg nightly. If his blood pressures continue to run low I would stop his lisinopril fully. Continue Imdur for antianginal effect, is that this does not initially affect his blood pressure. He is not on diuretic and has been euvolemic.  Encourage adequate hydration.

## 2017-08-19 NOTE — Assessment & Plan Note (Signed)
He had been on rosuvastatin, but this is no longer listed.  At this point, if his  lipids look okay, we can see how he does without it.  But unfortunately I have not seen recent lipids.

## 2017-10-31 ENCOUNTER — Other Ambulatory Visit: Payer: Self-pay | Admitting: Cardiology

## 2017-11-01 NOTE — Telephone Encounter (Signed)
Rx sent to pharmacy   

## 2017-11-13 DIAGNOSIS — H35033 Hypertensive retinopathy, bilateral: Secondary | ICD-10-CM | POA: Insufficient documentation

## 2017-12-02 ENCOUNTER — Other Ambulatory Visit: Payer: Self-pay | Admitting: Pharmacist

## 2017-12-02 ENCOUNTER — Telehealth: Payer: Self-pay | Admitting: Cardiology

## 2017-12-02 NOTE — Telephone Encounter (Signed)
[facility]. She reports patient told her he is not taking his statin anymore. MD March office note acknowledged that patient was off statins at this time but he did not have recent labs to review. Per Lanora ManisElizabeth, patient has lipids in [facility] from Feb 25 - they are noted below  Total chol: 181 HDL: 32 LDL: 117 Trig: 162  Will route to MD to review.

## 2017-12-02 NOTE — Patient Outreach (Signed)
[facility] ([facility] Care Management  12/02/2017  Daniel [name] 12/28/1932 [id number]   Incoming call from Daniel ChurnJames M [name] in response to the [facility] Medication Adherence Campaign. Speak with patient. HIPAA identifiers verified and verbal consent received.  Daniel [name] reports that his PCP discontinued his statin at his last Office Visit due to muscle side effects that he has been experiencing. Patient reports that this has been an ongoing issue for him. Note that this side effect is listed on the patient's Allergy list in Epic.   Per most recent visit note in Epic from patient's Cardiologist on 08/16/17, patient has past medical history of coronary artery disease and a LDL goal of <70 mg/dL. Provider's assessment of patient's dyslipidemia from this visit: "He had been on rosuvastatin, but this is no longer listed.  At this point, if his  lipids look okay, we can see how he does without it.  But unfortunately I have not seen recent lipids."  Daniel [name] denies any medication questions/concerns at this time.    PLAN  1) Will call patient's Cardiologist office to request that the office request the patient's latest lipid labs from patient's PCP office [facility] for provider's review.  2) Will close pharmacy episode at this time.  Duanne MoronElisabeth Leeana Creer, PharmD, [facility] Clinical Pharmacist [facility] Care Management [phone]

## 2017-12-02 NOTE — Telephone Encounter (Signed)
New Message    Lanora ManisElizabeth a pharmacist with [facility] is calling in reference to patient. She says she was reviewing some notes of Dr. Herbie BaltimoreHarding from March. In the message Dr. Herbie BaltimoreHarding indicated that he did not have some recent labs in reference to his statins. She is reporting that the patient PCP has some lab readings from 02/25 that she would like for Dr. Herbie BaltimoreHarding to obtain from them. Please call.

## 2017-12-02 NOTE — Patient Outreach (Signed)
[facility] Daniel [name]([facility] Care Management  12/02/2017  Daniel ChurnJames M [name] 08/09/1932 [id number]   Speak with Daniel StanfordJenna in Daniel [name]. Ask that the [name] use the patient's latest lipid labs from patient's PCP [name] for provider's review.  Daniel MoronElisabeth Zenaya [name], PharmD, [facility] HospitalBCACP Clinical Pharmacist [facility] Care Management [phone]

## 2017-12-05 NOTE — Telephone Encounter (Signed)
[facility] who called in concerning this matter as an FYI.

## 2017-12-05 NOTE — Telephone Encounter (Signed)
He is 84 years old & did not want to take statin - was having lots of pains.  My last note indicates this.  Lipids are not good, but I doubt he would take a statin. We can discuss other options in f/u appt - but his PCP is also monitoring his lipids & may be working on this.  Daniel [name] c

## 2018-01-20 ENCOUNTER — Telehealth: Payer: Self-pay | Admitting: Cardiology

## 2018-01-20 MED ORDER — CLOPIDOGREL BISULFATE 75 MG PO TABS
75.0000 mg | ORAL_TABLET | Freq: Every day | ORAL | 1 refills | Status: DC
Start: 2018-01-20 — End: 2018-07-10

## 2018-01-20 NOTE — Telephone Encounter (Signed)
New Message    *STAT* If patient is at the pharmacy, call can be transferred to refill team.   1. Which medications need to be refilled? (please list name of each medication and dose if known) clopidogrel (PLAVIX) 75 MG tablet  2. Which pharmacy/location (including street and city if local pharmacy) is medication to be sent to?  [facility] [phone] - [place], [place] - [address]  3. Do they need a 30 day or 90 day supply? 90

## 2018-05-17 ENCOUNTER — Telehealth: Payer: Self-pay | Admitting: *Deleted

## 2018-05-17 MED ORDER — PREDNISONE 10 MG PO TABS: 10.0000 mg | ORAL_TABLET | Freq: Every day | ORAL | 0 refills | Status: AC

## 2018-05-17 MED ORDER — CLINDAMYCIN HCL 150 MG PO CAPS: 150.0000 mg | ORAL_CAPSULE | Freq: Three times a day (TID) | ORAL | 0 refills | Status: AC

## 2018-05-17 NOTE — Telephone Encounter (Signed)
Please have patient take Clindamycin 150 - 1 tablet 3 times a day for the next 10 daya and prednisone 10mg  - 1 tablet 1 time a day for 10 days and see us in clinic as his last visit was over one year ago. (Dr. Selena BattenKim?)

## 2018-05-17 NOTE — Telephone Encounter (Signed)
Sent medication to pharmacy and patient has appt with Dr. Selena BattenKim on 05/18/18 @ 11 am.

## 2018-05-17 NOTE — Telephone Encounter (Signed)
Patient called requesting an antibiotic, patient states over the counter stuff isn't helping. Please advise [phone]   [facility] [address]

## 2018-05-17 NOTE — Telephone Encounter (Signed)
Spoke with patient.  He stated that he has had green/yellow drainage for about a week and half now.  Stated that for the past three or four days he has also been experiencing nose bleeds.  He's taken Mucinex and Tylenol sinus for several days with no relief.  He states that he has a sore throat and just generally feels bad and would like and antibiotic.  Please advise.

## 2018-05-18 ENCOUNTER — Ambulatory Visit: Payer: Medicare PPO | Admitting: Allergy

## 2018-05-18 ENCOUNTER — Encounter: Payer: Self-pay | Admitting: Allergy

## 2018-05-18 VITALS — BP 118/78 | HR 70 | Temp 97.5°F | Resp 18 | Ht 75.0 in | Wt 198.6 lb

## 2018-05-18 DIAGNOSIS — J0100 Acute maxillary sinusitis, unspecified: Secondary | ICD-10-CM | POA: Diagnosis not present

## 2018-05-18 DIAGNOSIS — J3089 Other allergic rhinitis: Secondary | ICD-10-CM

## 2018-05-18 NOTE — Progress Notes (Signed)
Follow Up Note  RE: Daniel ParmaJames M [name] MRN: [id number] DOB: 04/17/1933 Date of Office Visit: 05/18/2018  Referring provider: Geoffry ParadiseAronson, Richard, MD Primary care provider: Geoffry ParadiseAronson, Richard, MD  Chief Complaint: Epistaxis and Allergic Rhinitis   History of Present Illness: I had the pleasure of seeing Daniel NeuJames [name] for a follow up visit at the [facility] on 05/18/2018. He is a 84 y.o. male, who is being followed for chronic rhinitis. Today he is here for new complaint of nasal congestion. His previous allergy office visit was on 04/12/2017 with Dr. Lucie LeatherKozlow.   For the past 2-3 weeks patient has been having issues with his sinuses. Usually gets headaches, sinus pressure, epistaxis and PND. Denies any fevers or chills.   Currently on azelastine 2 sprays BID and Rhinocort 1 sprays once a day which controls his symptoms usually. He also does saline washes 1-2 times a day and added mucinex the last week with unknown benefit.   He usually gets one sinus infection per year which requires antibiotics and prednisone. Patient started on prednisone and clindamycin yesterday which was sent in by Dr. Lucie LeatherKozlow.   Assessment and Plan: Daniel FearingJames is a 84 y.o. male with: Acute maxillary sinusitis Sinusitis symptoms for the past 2-3 weeks and not improving. Usually gets sinus infections once a year requiring prednisone and antibiotics.  Finish prednisone and clindamycin.  Hold off using azelastine and Rhinocort for next few days due to epistaxis.  Use saline irrigation twice a day for the next week and then use once a day.   Once nosebleeds have calmed down may restart Rhinocort 1 spray once a day.  May use azelastine 1-2 sprays twice a day as needed for runny nose.  For thick post nasal drainage, add guaifenesin 600 mg (Mucinex)  twice daily as needed with adequate hydration as discussed. Nose Bleeds: Nosebleeds are very common.  Site of the bleeding is typically on the septum or at the very  front of the nose.  Some of the more common causes are from trauma, inflammation or medication induced. Preventative treatment: 1.  Apply saline nasal gel in each nostril twice a day for 2 weeks to allow the nasal mucosa to heal 2.  Consider using a humidifier in the winter 3. Try to keep your blood pressure as normal as possible (120/80)  Allergic rhinitis Past history - 2007 skin testing was positive to molds only. Interim history - Well controlled with below regimen.  Continue appropriate allergen avoidance measures.  Continue Rhinocort 1 spray once a day.  May use azelastine 1-2 sprays twice a day as needed for runny nose.  Continue Singulair 10mg  daily.  Use saline irrigations as needed.   Return in about 1 year (around 05/19/2019).  Diagnostics: None.  Medication List:  Current Outpatient Medications  Medication Sig Dispense Refill  . acetaminophen (TYLENOL) 500 MG tablet Take 1,000 mg by mouth every 8 (eight) hours as needed for mild pain.    [name]. azelastine (ASTELIN) 0.1 % nasal spray instill 2 sprays into each nostril twice a day 30 mL 5  . budesonide (RHINOCORT AQUA) 32 MCG/ACT nasal spray Use 1 spray in each nostril once daily    . cholecalciferol (VITAMIN D) 1000 units tablet Take 1,000 Units by mouth daily.    . clindamycin (CLEOCIN) 150 MG capsule Take 1 capsule (150 mg total) by mouth 3 (three) times daily for 10 days. 30 capsule 0  . clopidogrel (PLAVIX) 75 MG tablet Take 1 tablet (75  mg total) by mouth daily. 90 tablet 1  . docusate sodium (COLACE) 100 MG capsule Take 200-300 mg by mouth daily as needed for mild constipation or moderate constipation.    [name] Histamine Dihydrochloride (AUSTRALIAN DREAM ARTHRITIS) 0.025 % CREA Apply 1 application topically as needed (for pain).    . isosorbide mononitrate (IMDUR) 30 MG 24 hr tablet Take 30 mg by mouth daily.    . Melatonin 5 MG TABS Take 5 mg by mouth at bedtime as needed (sleep).    . metoprolol succinate (TOPROL-XL)  50 MG 24 hr tablet Take 1 tablet (50 mg total) by mouth 2 (two) times daily. 180 tablet 3  . montelukast (SINGULAIR) 10 MG tablet Take 10 mg by mouth at bedtime.    . Multiple Vitamin (MULTIVITAMIN WITH MINERALS) TABS tablet Take 1 tablet by mouth daily.    . nitroGLYCERIN (NITROSTAT) 0.4 MG SL tablet PLACE 1 TABLET UNDER THE TONGUE IF NEEDED EVERY 5 MINUTES FOR CHEST PAIN FOR 3 DOSES; IF NO RELIEF AFTER FIRST DOSE CALL PRESCRIBER OR 911. 25 tablet 0  . predniSONE (DELTASONE) 10 MG tablet Take 1 tablet (10 mg total) by mouth daily with breakfast for 10 days. 10 tablet 0  . tamsulosin (FLOMAX) 0.4 MG CAPS capsule Take 0.4 mg by mouth at bedtime.   0  . traMADol (ULTRAM) 50 MG tablet Take 100 mg by mouth 2 (two) times daily.     . vitamin B-12 (CYANOCOBALAMIN) 1000 MCG tablet Take 1,000 mcg by mouth 2 (two) times daily.     [name] zolpidem (AMBIEN) 10 MG tablet Take 10 mg by mouth at bedtime as needed for sleep.     No current facility-administered medications for this visit.    Allergies: Allergies  Allergen Reactions  . Penicillins Swelling and Other (See Comments)    Swelling and redness in joints  Has patient had a PCN reaction causing immediate rash, facial/tongue/throat swelling, SOB or lightheadedness with hypotension: No Has patient had a PCN reaction causing severe rash involving mucus membranes or skin necrosis: No Has patient had a PCN reaction that required hospitalization: No Has patient had a PCN reaction occurring within the last 10 years: No If all of the above answers are "NO", then may proceed with Cephalosporin use.   . Sulfa Antibiotics Other (See Comments)    Pain in left side underneath ribcage-pancreas  . Statins Other (See Comments)    UNSPECIFIED REACTION  "INTOLERANCE AT HIGH DOSES"   I reviewed his past medical history, social history, family history, and environmental history and no significant changes have been reported from previous visit on 04/12/2017.  Review  of Systems  Constitutional: Negative for appetite change, chills, fever and unexpected weight change.  HENT: Positive for congestion and postnasal drip. Negative for rhinorrhea.   Eyes: Negative for itching.  Respiratory: Negative for cough, chest tightness, shortness of breath and wheezing.   Gastrointestinal: Negative for abdominal pain.  Skin: Negative for rash.  Allergic/Immunologic: Positive for environmental allergies.  Neurological: Positive for headaches.   Objective: BP 118/78 (BP Location: Left Arm, Patient Position: Sitting, Cuff Size: Normal)   Pulse 70   Temp (!) 97.5 F (36.4 C) (Oral)   Resp 18   Ht 6\' 3"  (1.905 m)   Wt 198 lb 9.6 oz (90.1 kg)   SpO2 97%   BMI 24.82 kg/m  Body mass index is 24.82 kg/m. Physical Exam  Constitutional: He is oriented to person, place, and time. He appears well-developed and well-nourished.  HENT:  Head: Normocephalic and atraumatic.  Right Ear: External ear normal.  Left Ear: External ear normal.  Mouth/Throat: Oropharynx is clear and moist.  Right nares - visible blood clot.   Eyes: Conjunctivae and EOM are normal.  Neck: Neck supple.  Cardiovascular: Normal rate, regular rhythm and normal heart sounds. Exam reveals no gallop and no friction rub.  No murmur heard. Pulmonary/Chest: Effort normal and breath sounds normal. He has no wheezes. He has no rales.  Lymphadenopathy:    He has no cervical adenopathy.  Neurological: He is alert and oriented to person, place, and time.  Skin: Skin is warm. No rash noted.  Psychiatric: He has a normal mood and affect. His behavior is normal.  Nursing note and vitals reviewed.  Previous notes and tests were reviewed. The plan was reviewed with the patient/family, and all questions/concerned were addressed.  It was my pleasure to see Douglass today and participate in his care. Please feel free to contact me with any questions or concerns.  Sincerely,  Wyline Mood, DO Allergy &  Immunology  [facility] office: [phone] [facility] office:[phone]

## 2018-05-18 NOTE — Assessment & Plan Note (Addendum)
Past history - 2007 skin testing was positive to molds only. Interim history - Well controlled with below regimen.  Continue appropriate allergen avoidance measures.  Continue Rhinocort 1 spray once a day.  May use azelastine 1-2 sprays twice a day as needed for runny nose.  Continue Singulair 10mg  daily.  Use saline irrigations as needed.

## 2018-05-18 NOTE — Patient Instructions (Signed)
Finish prednisone and clindamycin.  Continue Singulair 10 mg daily.  Hold off using azelastine and Rhinocort for next few days.  Use saline irrigation twice a day for the next week and then use once a day.   Once nosebleeds have calmed down may restart Rhinocort 1 spray once a day. May use azelastine 1-2 sprays twice a day as needed for runny nose.  Nose Bleeds: Nosebleeds are very common.  Site of the bleeding is typically on the septum or at the very front of the nose.  Some of the more common causes are from trauma, inflammation or medication induced. Preventative treatment: 1.  Apply saline nasal gel in each nostril twice a day for 2 weeks to allow the nasal mucosa to heal 2.  Consider using a humidifier in the winter 3. Try to keep your blood pressure as normal as possible (120/80)  Follow up in 1 year

## 2018-05-18 NOTE — Assessment & Plan Note (Signed)
Sinusitis symptoms for the past 2-3 weeks and not improving. Usually gets sinus infections once a year requiring prednisone and antibiotics.  Finish prednisone and clindamycin.  Hold off using azelastine and Rhinocort for next few days due to epistaxis.  Use saline irrigation twice a day for the next week and then use once a day.   Once nosebleeds have calmed down may restart Rhinocort 1 spray once a day.  May use azelastine 1-2 sprays twice a day as needed for runny nose.  For thick post nasal drainage, add guaifenesin 600 mg (Mucinex)  twice daily as needed with adequate hydration as discussed. Nose Bleeds: Nosebleeds are very common.  Site of the bleeding is typically on the septum or at the very front of the nose.  Some of the more common causes are from trauma, inflammation or medication induced. Preventative treatment: 1.  Apply saline nasal gel in each nostril twice a day for 2 weeks to allow the nasal mucosa to heal 2.  Consider using a humidifier in the winter 3. Try to keep your blood pressure as normal as possible (120/80)

## 2018-06-03 ENCOUNTER — Other Ambulatory Visit: Payer: Self-pay | Admitting: Allergy and Immunology

## 2018-06-20 ENCOUNTER — Ambulatory Visit: Payer: Medicare Other | Admitting: Cardiology

## 2018-06-20 ENCOUNTER — Encounter: Payer: Self-pay | Admitting: Cardiology

## 2018-06-20 VITALS — BP 118/72 | HR 72 | Ht 75.0 in | Wt 204.2 lb

## 2018-06-20 DIAGNOSIS — R002 Palpitations: Secondary | ICD-10-CM

## 2018-06-20 DIAGNOSIS — I2510 Atherosclerotic heart disease of native coronary artery without angina pectoris: Secondary | ICD-10-CM | POA: Diagnosis not present

## 2018-06-20 DIAGNOSIS — Z9861 Coronary angioplasty status: Secondary | ICD-10-CM

## 2018-06-20 DIAGNOSIS — E785 Hyperlipidemia, unspecified: Secondary | ICD-10-CM | POA: Diagnosis not present

## 2018-06-20 DIAGNOSIS — I10 Essential (primary) hypertension: Secondary | ICD-10-CM

## 2018-06-20 DIAGNOSIS — I951 Orthostatic hypotension: Secondary | ICD-10-CM

## 2018-06-20 DIAGNOSIS — Z01810 Encounter for preprocedural cardiovascular examination: Secondary | ICD-10-CM

## 2018-06-20 MED ORDER — METOPROLOL SUCCINATE ER 50 MG PO TB24: ORAL_TABLET | ORAL | 3 refills | Status: DC

## 2018-06-20 NOTE — Progress Notes (Signed)
PCP: Geoffry ParadiseAronson, Richard, MD  Orthopedic Surgeon: Dr. Sherlean FootLucey  Clinic Note: Chief Complaint  Patient presents with  . Follow-up    Pt states no Sx.   . Coronary Artery Disease    No angina  . Pre-op Exam    Knee surgery    HPI: Daniel [name] is a 85 y.o. male with a PMH below who presents today a couple months early for preoperative evaluation (knee surgery with Dr. Valentina GuLucy).  He is a former patient Dr. Julieanne MansonAlfred Little with a distant history of CAD  1997: Multivessel CAD-CABG for LM CAD -- evaluation was for exertional angina.   June 2008: PTCA of rPDA for what sounded like acute CHF Sx - dyspnea, no angina.   He has a known atretic LIMA but the LAD fills via the SVG-D2.   He had mild chronic angina in the past, but was evaluated by cardiac catheterization showing stable disease in 2017.  Now off aspirin.  He also has labile hypertension with some orthostatic hypotension.  Has recently been taken off of his ACE inhibitor and we reduced his p.m. dose of beta-blocker.  Daniel [name] was last seen on in March 2019.  He looks somewhat frail and tired.  He was walking with a walker and a slow gait.  Had lost about 20 pounds.  He was dealing with cervical spine issues.  -->  Had noted several falls.  At that time he was not able to reduce his beta-blocker down to 50 mg twice daily because of palpitations. Was being evaluated by neurosurgery for C-spine surgery -that he had in February.  --He has been diagnosed with right retinal vein occlusion and is seeing an eye doctor at [facility].  Recommended that he stay on Plavix.  Recent Hospitalizations:  None  Studies Personally Reviewed - (if available, images/films reviewed: From Epic Chart or Care Everywhere)  No new studies  Interval History: Daniel [name] returns here today actually doing fairly well.  He looks much better than he did last visit.  He is in better spirits.  He is now walking with a cane and indicates that he is having  issues with his knees much more the right than the left.  This is most really limiting his walking.  He thinks he probably needs to have knee surgery done now.  These were canceled because of his neck issues last year.  For the most part, his heart rates have been pretty well controlled but may be once a month he will have an episode of tachycardia where he has taken extra half dose of beta-blocker.  Other than that the palpitations are controlled.  He does not have any chest pain or pressure with rest or exertion.  He has no PND, orthopnea with minimal edema.  He feels better and indicates that he never really did restart the ACE inhibitor after his falls last year.  And with that he is has more energy, but still feels like he gets tired quicker than he used to.  He is not really short of breath more so than it is tired and when he gets tired then he gets short of breath.  He does have any chest pain or pressure. No syncope or near syncope.  No recent falls.  No TIA or amaurosis fugax. He really feels better now that his neck is been fixed in his back healthier having gained back some of his weight.   We talked about his cholesterol, and he  recanted his concerns with statins indicating that his relative intolerance to statins because of myalgias including in the past atorvastatin and rosuvastatin.  He says his blood pressures are looking much better even off of his ACE inhibitor. No bleeding on Plavix.  Much better without aspirin.  His ophthalmologist has requested that he stay on Plavix.  ROS: A comprehensive was performed. Review of Systems  Constitutional: Positive for malaise/fatigue (See HPI) and weight loss.  HENT: Negative for congestion and nosebleeds.   Respiratory: Negative for cough and shortness of breath (More fatigue than dyspnea).   Gastrointestinal: Positive for heartburn. Negative for blood in stool.  Genitourinary: Negative for dysuria, hematuria and urgency.  Musculoskeletal:  Positive for joint pain (His right knee is really bothering him.  More so than left but both are bothering him) and myalgias.  Neurological: Positive for dizziness (Mostly positional.). Negative for focal weakness.  Endo/Heme/Allergies: Bruises/bleeds easily.  Psychiatric/Behavioral: Negative for depression and memory loss. The patient is not nervous/anxious.        He seems to be more stable from a depression standpoint now that he is overall feeling better. He is a little bit stressed because his wife is undergoing some medical issues.  All other systems reviewed and are negative.   I have reviewed and (if needed) personally updated the patient's problem list, medications, allergies, past medical and surgical history, social and family history.   Past Medical History:  Diagnosis Date  . Arthritis   . CAD of autologous arterial graft 2010   Atretic LIMA; Myoview Jan 2015: Possible mild basal anteroseptal defect thought to be artifact (CRO early LAD ischemia due to inadeuate retrograde perfusion via SVG-Diag  . CAD S/P percutaneous coronary angioplasty June 2008;    a. 6/'08: PCI nongrafted distal RCA-PDA: Mini vision BMS 2.25 mm x 28 mm; b. Relook Cath 08/2015: Stable CAD. No culprit lesion. Widely patent PDA stent. Patent SVG-OM 2, SVG-D2. Essentially atretic/small LIMA-LAD, but the LAD is perfused via SVG-D2  . Complication of anesthesia   . Coronary artery disease involving left main coronary artery 1997-2010   Last cath 2010: 70-80 % ostial left main, 80-90% LAD, subtotal occluded circumflex, RCA now a patent stent; SVG-diagonal backfills LAD, atretic LIMA-LAD -- medical therapy; stable by relook cath in March 2017  . Difficult intubation    VERY NARROW AIRWAY PER ANESTHESIOLOGIST (1997 CABG AT [facility])  . Dyslipidemia, goal LDL below 70   . Dysrhythmia   . Heart palpitations    Never fully diagnosed.  [name] History of kidney stones   . Hypertension   . Insomnia   . Mass in chest    JUST  WATCHING , WILL CHECK OUT AFTER SURGERY  . S/P CABG x 3 1997   LIMA-LAD, SVG-D2, SVG-OM --> LIMA known to be atretic, but SVG-D2 fills diagonal and LAD  . Stenosis of cervical spine with myelopathy (HCC)   . Tingling    in fingers    Past Surgical History:  Procedure Laterality Date  . 48-HOUR MONITOR  03/2017   Relatively normal.  Very infrequent ectopy.  Short PAT run with occasional PACs/PVCs and bigeminy.  No symptoms noted on monitor.  Longest PAT runs were less than 10 beats.  [name] BACK SURGERY     decompression  lower back   . CARDIAC CATHETERIZATION  June 2008   showed high grade distal RCA and PDA stenosis and underwent PCI with a 2.25 mm x 28 mm Mini Vision bare- metal stent.  [name]  CARDIAC CATHETERIZATION  2010   Native coronaries at 70% to 80% ostial left main. LAD had diffuse 80% to 90% stenosis  . CARDIAC CATHETERIZATION N/A 08/08/2015   Procedure: Left Heart Cath and Cors/Grafts Angiography;  Surgeon: Marykay Lex, MD;  Location: [facility] INVASIVE CV LAB;  Service: Cardiovascular;  Laterality: N/A;  . COLONOSCOPY    . CORONARY ANGIOPLASTY WITH STENT PLACEMENT    . CORONARY ARTERY BYPASS GRAFT  [id number]   LIMA - LAD, SVG- diagonal, SVG-OM  . EYE SURGERY     BIL CATARACT  . KIDNEY STONE SURGERY    . KNEE ARTHROSCOPY     RT  KNEE  . NM MYOVIEW LTD  02/25/2004; Jan 2015   a) EF 57%; b)  Normal Nuclear Stress Test - No evidence of Ischemia or Infarction. Normal LV Function & wall motion.  [name] NM MYOVIEW LTD  03/17/2017   No significant reversible ischemia. Increased TID ratio 1.34. LVEF 54% with normal wall motion. This is a low risk study.  [name] POSTERIOR CERVICAL FUSION/FORAMINOTOMY N/A 07/14/2017   Procedure: LAMINECTOMY CERVICAL THREE- CERVICAL FOUR WITH LATERAL MASS FUSION;  Surgeon: Lisbeth Renshaw, MD;  Location: [facility] OR;  Service: Neurosurgery;  Laterality: N/A;  LAMINECTOMY CERVICAL 3- CERVICAL 4 WITH LATERAL MASS FUSION    Current Meds  Medication Sig  . acetaminophen  (TYLENOL) 500 MG tablet Take 1,000 mg by mouth every 8 (eight) hours as needed for mild pain.  [name] azelastine (ASTELIN) 0.1 % nasal spray INSTILL 2 SPRAYS INTO EACH NOSTRIL TWICE A DAY  . budesonide (RHINOCORT AQUA) 32 MCG/ACT nasal spray Use 1 spray in each nostril once daily  . cholecalciferol (VITAMIN D) 1000 units tablet Take 1,000 Units by mouth daily.  . clopidogrel (PLAVIX) 75 MG tablet Take 1 tablet (75 mg total) by mouth daily.  [name] docusate sodium (COLACE) 100 MG capsule Take 200-300 mg by mouth daily as needed for mild constipation or moderate constipation.  [name] Histamine Dihydrochloride (AUSTRALIAN DREAM ARTHRITIS) 0.025 % CREA Apply 1 application topically as needed (for pain).  . isosorbide mononitrate (IMDUR) 30 MG 24 hr tablet Take 30 mg by mouth daily.  . metoprolol succinate (TOPROL-XL) 50 MG 24 hr tablet Take 25 mg  ( 1/2 tablet)n the morning and 50 mg  In the evening,may take an extra 25 mg if needed  . montelukast (SINGULAIR) 10 MG tablet Take 10 mg by mouth at bedtime.  . Multiple Vitamin (MULTIVITAMIN WITH MINERALS) TABS tablet Take 1 tablet by mouth daily.  . nitroGLYCERIN (NITROSTAT) 0.4 MG SL tablet PLACE 1 TABLET UNDER THE TONGUE IF NEEDED EVERY 5 MINUTES FOR CHEST PAIN FOR 3 DOSES; IF NO RELIEF AFTER FIRST DOSE CALL PRESCRIBER OR 911.  . tamsulosin (FLOMAX) 0.4 MG CAPS capsule Take 0.4 mg by mouth at bedtime.   . traMADol (ULTRAM) 50 MG tablet Take 100 mg by mouth 2 (two) times daily.   . vitamin B-12 (CYANOCOBALAMIN) 1000 MCG tablet Take 1,000 mcg by mouth 2 (two) times daily.   [name] zolpidem (AMBIEN) 10 MG tablet Take 10 mg by mouth at bedtime as needed for sleep.  . [DISCONTINUED] Melatonin 5 MG TABS Take 5 mg by mouth at bedtime as needed (sleep).  . [DISCONTINUED] metoprolol succinate (TOPROL-XL) 50 MG 24 hr tablet Take 1 tablet (50 mg total) by mouth 2 (two) times daily.    Allergies  Allergen Reactions  . Penicillins Swelling and Other (See Comments)    Swelling and  redness in joints  Has patient had a PCN reaction causing immediate rash, facial/tongue/throat swelling, SOB or lightheadedness with hypotension: No Has patient had a PCN reaction causing severe rash involving mucus membranes or skin necrosis: No Has patient had a PCN reaction that required hospitalization: No Has patient had a PCN reaction occurring within the last 10 years: No If all of the above answers are "NO", then may proceed with Cephalosporin use.   . Sulfa Antibiotics Other (See Comments)    Pain in left side underneath ribcage-pancreas  . Statins Other (See Comments)    UNSPECIFIED REACTION  "INTOLERANCE AT HIGH DOSES"    Social History   Tobacco Use  . Smoking status: Never Smoker  . Smokeless tobacco: Never Used  Substance Use Topics  . Alcohol use: No  . Drug use: No   Social History   Social History Narrative   He is a married father of 2, grandfather 5. He is retired from [employer], Psychologist, educational. 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  He is usually very active, but is limited as far as standing exercise by his knee osteoarthritis.   He does not drink and does not smoke. Never smoked.  Live with wife in a one story home.  Education: college.     family history includes Arthritis in his mother; Heart attack (age of onset: 32) in his father; Heart disease in his brother and mother; Other in his sister; Stroke (age of onset: 50) in his mother.  Wt Readings from Last 3 Encounters:  06/20/18 204 lb 3.2 oz (92.6 kg)  05/18/18 198 lb 9.6 oz (90.1 kg)  08/16/17 186 lb (84.4 kg)    PHYSICAL EXAM BP 118/72   Pulse 72   Ht 6\' 3"  (1.905 m)   Wt 204 lb 3.2 oz (92.6 kg)   BMI 25.52 kg/m  Physical Exam  Constitutional: He is oriented to person, place, and time. He appears well-developed and well-nourished.  Looks more robust than before..  Seems to have put on some weight.  Not as  frail.  Overall healthy-appearing.  Well-groomed  HENT:  Head: Normocephalic and atraumatic.  Neck: No hepatojugular reflux and no JVD present. Carotid bruit is not present.  Cardiovascular: Normal rate, regular rhythm, normal heart sounds and normal pulses.  No extrasystoles are present. PMI is not displaced. Exam reveals no gallop.  No murmur heard. Pulmonary/Chest: Effort normal and breath sounds normal. No respiratory distress. He has no wheezes. He has no rales.  Abdominal: Soft. Bowel sounds are normal. He exhibits no distension. There is no abdominal tenderness.  No HSM  Musculoskeletal: Normal range of motion.        General: No edema.  Neurological: He is alert and oriented to person, place, and time.  Psychiatric: He has a normal mood and affect. His behavior is normal. Judgment and thought content normal.  Nursing note and vitals reviewed.  Adult ECG Report  Rate: 72 ;  Rhythm: normal sinus rhythm and Normal axis, intervals and durations.;   Narrative Interpretation: Normal EKG   Other studies Reviewed: Additional studies/ records that were reviewed today include:  Recent Labs:   Lab Results  Component Value Date   CREATININE 0.90 08/05/2017   BUN 14 08/05/2017   NA 140 08/05/2017   K 4.4 08/05/2017   CL 104 08/05/2017   CO2 25 08/05/2017   -K PN February 2019: Total cholesterol 181, TG 162, HDL 32, LDL  117.   ASSESSMENT / PLAN: Problem List Items Addressed This Visit    CAD S/P PCI June 2008 (Chronic)    Longstanding history of CAD dating back to 1997.  Interestingly, he has an atretic LIMA but the LAD is filled via retrograde filling from the SVG-diagonal.  He has had PTCA of the PDA back in 2008, and no further intervention. Follow-up cath in 2017 was stable.  He remains on Plavix along without aspirin. Is on beta-blocker and Imdur with no further angina. Not on statin because of intolerance.  (Would rather not address lipids).  Would be okay to hold Plavix  for surgery 5 to 7 days preop.  May want to discuss with his ophthalmologist, potentially we could keep him on aspirin during that period of time when not on Plavix.  With no active cardiac symptoms of either angina or heart failure, no stroke history with normal renal function, he would be a low risk patient for what is a low risk surgery.  I would not recommend further cardiac evaluation preoperatively.      Relevant Medications   metoprolol succinate (TOPROL-XL) 50 MG 24 hr tablet   Other Relevant Orders   EKG 12-Lead (Completed)   Dyslipidemia, goal LDL below 70 (Chronic)    He finally stopped the rosuvastatin stating that it was causing him too many cramping issues.  He would prefer to continue to monitor.  He is due for labs in the next couple months.  If his lipids are way out of control, we may need to do more, but for now he would like to try to avoid medicines.      Relevant Medications   metoprolol succinate (TOPROL-XL) 50 MG 24 hr tablet   Essential hypertension (Chronic)    Much more stable blood pressure now.  He actually is doing better with the current dose of Toprol where he is only taking 50 mg in the morning and 25 mg in the afternoon.  This allows him to take an additional 25 mg as needed.  For now I would not make any further adjustment.      Relevant Medications   metoprolol succinate (TOPROL-XL) 50 MG 24 hr tablet   Heart palpitations (Chronic)    Doing fairly well.  Minimal use of PRN beta-blocker.  I would like to keep him on his current dose.      Relevant Orders   EKG 12-Lead (Completed)   Orthostatic hypotension (Chronic)   Relevant Medications   metoprolol succinate (TOPROL-XL) 50 MG 24 hr tablet   Preoperative cardiovascular examination - Primary    For full details of evaluation see my note dated back in November 2018.  The basic assessment is the same.  Revised Cardiac Risk Index: No active CAD or heart failure symptoms.  Low risk surgery (knee  surgery), nondiabetic, no prior stroke history and normal renal function.  LOW RISK.  1-3% reduced to 1% being on beta-blocker standing dose.  In the absence of active cardiac symptoms, no further cardiac evaluation warranted.  Would recommend proceeding to the OR without further evaluation.  Okay to hold Plavix 5 to 7 days preop.  (Would recommend eliciting advice from his ophthalmologist at [facility] because of retinal vein occlusion --may want to cover with aspirin during the period of time off of Plavix if possible)      Relevant Orders   EKG 12-Lead (Completed)      Current medicines are reviewed at length with the patient today. (+/-  concerns) fatigued  The following changes have been made: see below.   Patient Instructions  Medication Instructions:  No changes If you need a refill on your cardiac medications before your next appointment, please call your pharmacy.   Lab work: Not needed If you have labs (blood work) drawn today and your tests are completely normal, you will receive your results only by: [name]. MyChart Message (if you have MyChart) OR . A paper copy in the mail If you have any lab test that is abnormal or we need to change your treatment, we will call you to review the results.  Testing/Procedures: Not needed  Follow-Up: At [facility], you and your health needs are our priority.  As part of our continuing mission to provide you with exceptional heart care, we have created designated Provider Care Teams.  These Care Teams include your primary Cardiologist (physician) and Advanced Practice Providers (APPs -  Physician Assistants and Nurse Practitioners) who all work together to provide you with the care you need, when you need it. You will need a follow up appointment in 6 months.  Please call our office 2 months in advance to schedule this appointment.  You may see Bryan Lemmaavid Ryleigh Esqueda, MD or one of the following Advanced Practice Providers on your designated Care  Team:   Theodore DemarkRhonda Barrett, PA-C . Joni ReiningKathryn Lawrence, DNP, ANP  Any Other Special Instructions Will Be Listed Below (If Applicable).     Clearance for surgery - low RISK    Studies Ordered:   Orders Placed This Encounter  Procedures  . EKG 12-Lead      Bryan Lemmaavid Suprena Travaglini, M.D., M.S. Interventional Cardiologist   Pager # [phone] Phone # [phone] [address] [place], [place] [address]   Thank you for choosing [facility] at [facility]!!

## 2018-06-20 NOTE — Patient Instructions (Signed)
Medication Instructions:  No changes If you need a refill on your cardiac medications before your next appointment, please call your pharmacy.   Lab work: Not needed If you have labs (blood work) drawn today and your tests are completely normal, you will receive your results only by: [name] MyChart Message (if you have MyChart) OR . A paper copy in the mail If you have any lab test that is abnormal or we need to change your treatment, we will call you to review the results.  Testing/Procedures: Not needed  Follow-Up: At [facility], you and your health needs are our priority.  As part of our continuing mission to provide you with exceptional heart care, we have created designated Provider Care Teams.  These Care Teams include your primary Cardiologist (physician) and Advanced Practice Providers (APPs -  Physician Assistants and Nurse Practitioners) who all work together to provide you with the care you need, when you need it. You will need a follow up appointment in 6 months.  Please call our office 2 months in advance to schedule this appointment.  You may see Bryan Lemma, MD or one of the following Advanced Practice Providers on your designated Care Team:   Theodore Demark, PA-C . Joni Reining, DNP, ANP  Any Other Special Instructions Will Be Listed Below (If Applicable).     Clearance for surgery - low RISK

## 2018-06-23 ENCOUNTER — Encounter: Payer: Self-pay | Admitting: Cardiology

## 2018-06-23 NOTE — Assessment & Plan Note (Addendum)
For full details of evaluation see my note dated back in November 2018.  The basic assessment is the same.  Revised Cardiac Risk Index: No active CAD or heart failure symptoms.  Low risk surgery (knee surgery), nondiabetic, no prior stroke history and normal renal function.  LOW RISK.  1-3% reduced to 1% being on beta-blocker standing dose.  In the absence of active cardiac symptoms, no further cardiac evaluation warranted.  Would recommend proceeding to the OR without further evaluation.  Okay to hold Plavix 5 to 7 days preop.  (Would recommend eliciting advice from his ophthalmologist at [facility] because of retinal vein occlusion --may want to cover with aspirin during the period of time off of Plavix if possible)

## 2018-06-23 NOTE — Assessment & Plan Note (Signed)
Much more stable blood pressure now.  He actually is doing better with the current dose of Toprol where he is only taking 50 mg in the morning and 25 mg in the afternoon.  This allows him to take an additional 25 mg as needed.  For now I would not make any further adjustment.

## 2018-06-23 NOTE — Assessment & Plan Note (Signed)
Doing fairly well.  Minimal use of PRN beta-blocker.  I would like to keep him on his current dose.

## 2018-06-23 NOTE — Assessment & Plan Note (Signed)
He finally stopped the rosuvastatin stating that it was causing him too many cramping issues.  He would prefer to continue to monitor.  He is due for labs in the next couple months.  If his lipids are way out of control, we may need to do more, but for now he would like to try to avoid medicines.

## 2018-06-23 NOTE — Assessment & Plan Note (Addendum)
Longstanding history of CAD dating back to 8.  Interestingly, he has an atretic LIMA but the LAD is filled via retrograde filling from the SVG-diagonal.  He has had PTCA of the PDA back in 2008, and no further intervention. Follow-up cath in 2017 was stable.  He remains on Plavix along without aspirin. Is on beta-blocker and Imdur with no further angina. Not on statin because of intolerance.  (Would rather not address lipids).  Would be okay to hold Plavix for surgery 5 to 7 days preop.  May want to discuss with his ophthalmologist, potentially we could keep him on aspirin during that period of time when not on Plavix.  With no active cardiac symptoms of either angina or heart failure, no stroke history with normal renal function, he would be a low risk patient for what is a low risk surgery.  I would not recommend further cardiac evaluation preoperatively.

## 2018-07-08 ENCOUNTER — Other Ambulatory Visit: Payer: Self-pay | Admitting: Cardiology

## 2018-07-10 NOTE — Telephone Encounter (Signed)
Rx request sent to pharmacy.  

## 2018-07-11 ENCOUNTER — Ambulatory Visit: Payer: Medicare Other | Admitting: Allergy and Immunology

## 2018-07-11 ENCOUNTER — Encounter: Payer: Self-pay | Admitting: Allergy and Immunology

## 2018-07-11 VITALS — BP 112/68 | HR 76 | Temp 97.8°F | Resp 18

## 2018-07-11 DIAGNOSIS — J324 Chronic pansinusitis: Secondary | ICD-10-CM

## 2018-07-11 DIAGNOSIS — J3089 Other allergic rhinitis: Secondary | ICD-10-CM

## 2018-07-11 MED ORDER — METHYLPREDNISOLONE ACETATE 80 MG/ML IJ SUSP
80.0000 mg | Freq: Once | INTRAMUSCULAR | Status: AC
  Administered 2018-07-11: 80 mg via INTRAMUSCULAR

## 2018-07-11 NOTE — Progress Notes (Signed)
Follow-up Note  Referring Provider: Geoffry Paradise, MD Primary Provider: Geoffry Paradise, MD Date of Office Visit: 07/11/2018  Subjective:   Daniel [name] (DOB: 30-Jun-1932) is a 85 y.o. male who [facility] in re-evaluation of the following:  HPI: Daniel [name] returns to this clinic in reevaluation of his allergic rhinoconjunctivitis and history of chronic sinusitis.  I last saw him in this clinic on 12 April 2017.  He did contact me by telephone on 17 May 2018 with complaints of sinusitis for which I started him on 20 days of clindamycin and a low dose of prednisone.  He did quite well with that form of therapy yet still continues to have some persistent nasal congestion and some sneezing and some decreased ability to smell.  All of the ugly nasal green-yellow discharge that he had prior to utilizing that therapy has resolved.  Now when he performs nasal washes they are clear.  He has not been using his nasal steroid as it did irritate his nose once he became sick.  Allergies as of 07/11/2018      Reactions   Penicillins Swelling, Other (See Comments)   Swelling and redness in joints Has patient had a PCN reaction causing immediate rash, facial/tongue/throat swelling, SOB or lightheadedness with hypotension: No Has patient had a PCN reaction causing severe rash involving mucus membranes or skin necrosis: No Has patient had a PCN reaction that required hospitalization: No Has patient had a PCN reaction occurring within the last 10 years: No If all of the above answers are "NO", then may proceed with Cephalosporin use.   Sulfa Antibiotics Other (See Comments)   Pain in left side underneath ribcage-pancreas   Statins Other (See Comments)   UNSPECIFIED REACTION  "INTOLERANCE AT HIGH DOSES"      Medication List      acetaminophen 500 MG tablet Commonly known as:  TYLENOL Take 1,000 mg by mouth every 8 (eight) hours as needed for mild  pain.   AUSTRALIAN DREAM ARTHRITIS 0.025 % Crea Generic drug:  Histamine Dihydrochloride Apply 1 application topically as needed (for pain).   azelastine 0.1 % nasal spray Commonly known as:  ASTELIN INSTILL 2 SPRAYS INTO EACH NOSTRIL TWICE A DAY   budesonide 32 MCG/ACT nasal spray Commonly known as:  RHINOCORT AQUA Use 1 spray in each nostril once daily   cholecalciferol 1000 units tablet Commonly known as:  VITAMIN D Take 1,000 Units by mouth daily.   clopidogrel 75 MG tablet Commonly known as:  PLAVIX TAKE 1 TABLET(75 MG) BY MOUTH DAILY   docusate sodium 100 MG capsule Commonly known as:  COLACE Take 200-300 mg by mouth daily as needed for mild constipation or moderate constipation.   isosorbide mononitrate 30 MG 24 hr tablet Commonly known as:  IMDUR Take 30 mg by mouth daily.   loratadine 10 MG tablet Commonly known as:  CLARITIN Take 10 mg by mouth daily.   metoprolol succinate 50 MG 24 hr tablet Commonly known as:  TOPROL-XL Take 25 mg  ( 1/2 tablet)n the morning and 50 mg  In the evening,may take an extra 25 mg if needed   montelukast 10 MG tablet Commonly known as:  SINGULAIR Take 10 mg by mouth at bedtime.   multivitamin with minerals Tabs tablet Take 1 tablet by mouth daily.   nitroGLYCERIN 0.4 MG SL tablet Commonly known as:  NITROSTAT PLACE 1 TABLET UNDER THE TONGUE IF NEEDED EVERY 5 MINUTES  FOR CHEST PAIN FOR 3 DOSES; IF NO RELIEF AFTER FIRST DOSE CALL PRESCRIBER OR 911.   tamsulosin 0.4 MG Caps capsule Commonly known as:  FLOMAX Take 0.4 mg by mouth at bedtime.   traMADol 50 MG tablet Commonly known as:  ULTRAM Take 100 mg by mouth 2 (two) times daily.   vitamin B-12 1000 MCG tablet Commonly known as:  CYANOCOBALAMIN Take 1,000 mcg by mouth 2 (two) times daily.   zolpidem 10 MG tablet Commonly known as:  AMBIEN Take 10 mg by mouth at bedtime as needed for sleep.       Past Medical History:  Diagnosis Date  . Arthritis   . CAD of  autologous arterial graft 2010   Atretic LIMA; Myoview Jan 2015: Possible mild basal anteroseptal defect thought to be artifact (CRO early LAD ischemia due to inadeuate retrograde perfusion via SVG-Diag  . CAD S/P percutaneous coronary angioplasty June 2008;    a. 6/'08: PCI nongrafted distal RCA-PDA: Mini vision BMS 2.25 mm x 28 mm; b. Relook Cath 08/2015: Stable CAD. No culprit lesion. Widely patent PDA stent. Patent SVG-OM 2, SVG-D2. Essentially atretic/small LIMA-LAD, but the LAD is perfused via SVG-D2  . Complication of anesthesia   . Coronary artery disease involving left main coronary artery 1997-2010   Last cath 2010: 70-80 % ostial left main, 80-90% LAD, subtotal occluded circumflex, RCA now a patent stent; SVG-diagonal backfills LAD, atretic LIMA-LAD -- medical therapy; stable by relook cath in March 2017  . Difficult intubation    VERY NARROW AIRWAY PER ANESTHESIOLOGIST ([facility])  . Dyslipidemia, goal LDL below 70   . Dysrhythmia   . Heart palpitations    Never fully diagnosed.  [name]. History of kidney stones   . Hypertension   . Insomnia   . Mass in chest    JUST WATCHING , WILL CHECK OUT AFTER SURGERY  . S/P CABG x 3 1997   LIMA-LAD, SVG-D2, SVG-OM --> LIMA known to be atretic, but SVG-D2 fills diagonal and LAD  . Stenosis of cervical spine with myelopathy (HCC)   . Tingling    in fingers    Past Surgical History:  Procedure Laterality Date  . 48-HOUR MONITOR  03/2017   Relatively normal.  Very infrequent ectopy.  Short PAT run with occasional PACs/PVCs and bigeminy.  No symptoms noted on monitor.  Longest PAT runs were less than 10 beats.  [name]. BACK SURGERY     decompression  lower back   . CARDIAC CATHETERIZATION  June 2008   showed high grade distal RCA and PDA stenosis and underwent PCI with a 2.25 mm x 28 mm Mini Vision bare- metal stent.  [name]. CARDIAC CATHETERIZATION  2010   Native coronaries at 70% to 80% ostial left main. LAD had diffuse 80% to 90% stenosis  .  CARDIAC CATHETERIZATION N/A 08/08/2015   Procedure: Left Heart Cath and Cors/Grafts Angiography;  Surgeon: Marykay Lexavid W Harding, MD;  [facility];  Service: Cardiovascular;  Laterality: N/A;  . COLONOSCOPY    . CORONARY ANGIOPLASTY WITH STENT PLACEMENT    . CORONARY ARTERY BYPASS GRAFT  [id number]   LIMA - LAD, SVG- diagonal, SVG-OM  . EYE SURGERY     BIL CATARACT  . KIDNEY STONE SURGERY    . KNEE ARTHROSCOPY     RT  KNEE  . NM MYOVIEW LTD  02/25/2004; Jan 2015   a) EF 57%; b)  Normal Nuclear Stress Test - No evidence of Ischemia  or Infarction. Normal LV Function & wall motion.  [name] NM MYOVIEW LTD  03/17/2017   No significant reversible ischemia. Increased TID ratio 1.34. LVEF 54% with normal wall motion. This is a low risk study.  [name] POSTERIOR CERVICAL FUSION/FORAMINOTOMY N/A 07/14/2017   Procedure: LAMINECTOMY CERVICAL THREE- CERVICAL FOUR WITH LATERAL MASS FUSION;  Surgeon: Lisbeth Renshaw, MD;  Location: [facility] OR;  Service: Neurosurgery;  Laterality: N/A;  LAMINECTOMY CERVICAL 3- CERVICAL 4 WITH LATERAL MASS FUSION    Review of systems negative except as noted in HPI / PMHx or noted below:  Review of Systems  Constitutional: Negative.   HENT: Negative.   Eyes: Negative.   Respiratory: Negative.   Cardiovascular: Negative.   Gastrointestinal: Negative.   Genitourinary: Negative.   Musculoskeletal: Negative.   Skin: Negative.   Neurological: Negative.   Endo/Heme/Allergies: Negative.   Psychiatric/Behavioral: Negative.      Objective:   Vitals:   07/11/18 1541  BP: 112/68  Pulse: 76  Resp: 18  Temp: 97.8 F (36.6 C)  SpO2: 95%          Physical Exam Constitutional:      Appearance: He is not diaphoretic.  HENT:     Head: Normocephalic.     Right Ear: Tympanic membrane, ear canal and external ear normal.     Left Ear: Tympanic membrane, ear canal and external ear normal.     Nose: Nose normal. No mucosal edema or rhinorrhea.     Mouth/Throat:      Pharynx: Uvula midline. No oropharyngeal exudate.  Eyes:     Conjunctiva/sclera: Conjunctivae normal.  Neck:     Thyroid: No thyromegaly.     Trachea: Trachea normal. No tracheal tenderness or tracheal deviation.  Cardiovascular:     Rate and Rhythm: Normal rate and regular rhythm.     Heart sounds: Normal heart sounds, S1 normal and S2 normal. No murmur.  Pulmonary:     Effort: No respiratory distress.     Breath sounds: Normal breath sounds. No stridor. No wheezing or rales.  Lymphadenopathy:     Head:     Right side of head: No tonsillar adenopathy.     Left side of head: No tonsillar adenopathy.     Cervical: No cervical adenopathy.  Skin:    Findings: No erythema or rash.     Nails: There is no clubbing.   Neurological:     Mental Status: He is alert.     Diagnostics: none  Assessment and Plan:   1. Chronic pansinusitis   2. Other allergic rhinitis     1.  Restart OTC Rhinocort or Nasacort one spray each nostril once a day.  2. Continue Montelukast 10 mg one tablet once a day  3. Continue nasal Azelastine 2 sprays each nostril 1-2 times per day  4. Can continue nasal saline washes  5.  For this prolonged upper airway issue use the following:   A.  Depo-Medrol 80 IM delivered in clinic today  6. Return to clinic in 12 months or earlier if problem  I will assume at this point in time that Daniel [name] has resolved the infectious component of his upper airway issue and is just dealing with inflammation at this point and treat him with anti-inflammatory agents as noted above for his airway.  He will keep in contact with me noting his response to this approach.  If he does well I will see him back in this clinic in 1 year or earlier if  there is a problem.  Allena Katz, MD Allergy / Immunology [place]

## 2018-07-11 NOTE — Patient Instructions (Addendum)
  1.  Restart OTC Rhinocort or Nasacort one spray each nostril once a day.  2. Continue Montelukast 10 mg one tablet once a day  3. Continue nasal Azelastine 2 sprays each nostril 1-2 times per day  4. Can continue nasal saline washes  5.  For this prolonged upper airway issue use the following:   A.  Depo-Medrol 80 IM delivered in clinic today  6. Return to clinic in 12 months or earlier if problem

## 2018-07-12 ENCOUNTER — Encounter: Payer: Self-pay | Admitting: Allergy and Immunology

## 2018-10-18 IMAGING — RF DG CERVICAL SPINE 2 OR 3 VIEWS
1 series · 3 of 3 positions shown · non-contrast
Comparison: MR of the cervical spine 06/21/2017

CLINICAL DATA: C3-4 laminectomy and posterior fusion.

EXAM:
DG C-ARM 61-120 MIN; CERVICAL SPINE - 2-3 VIEW

[Series 1: run · 3 of 3 slices shown]
[im 1/3]
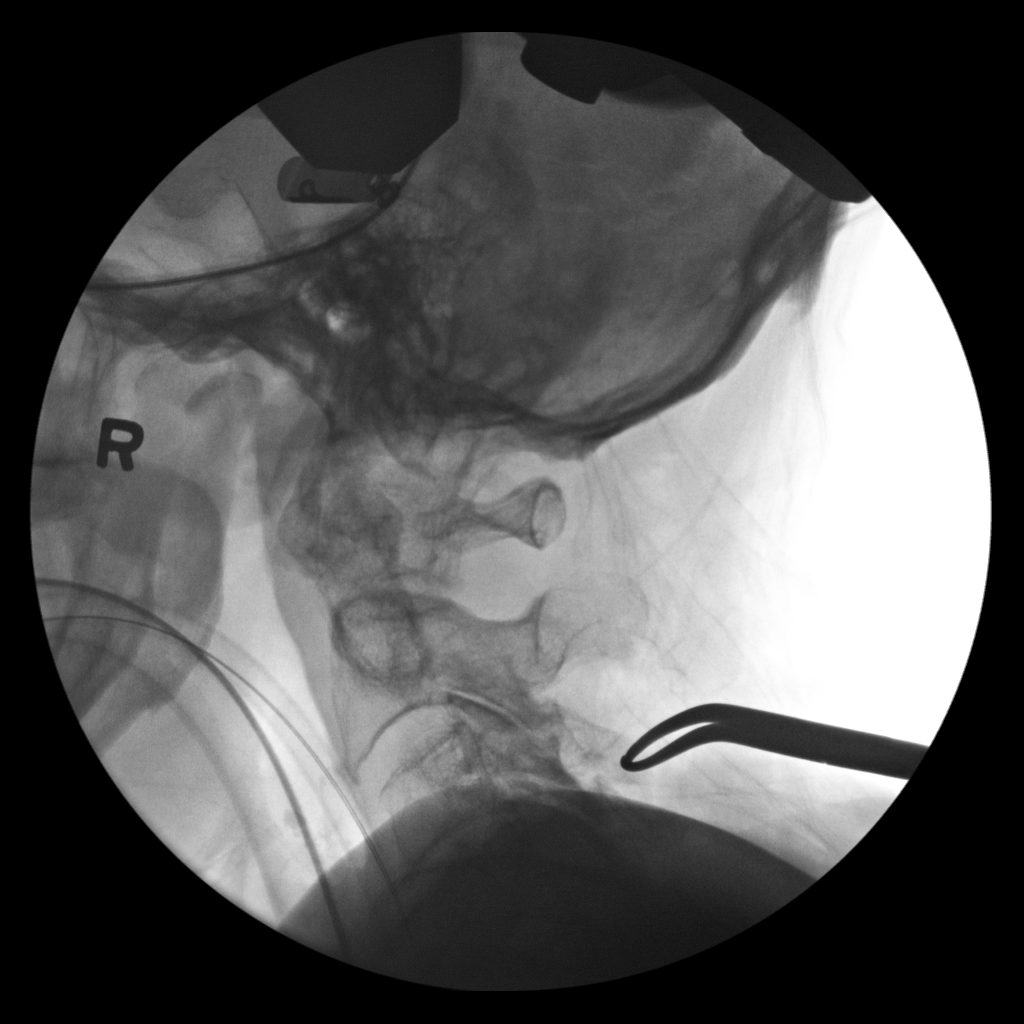
[im 2/3]
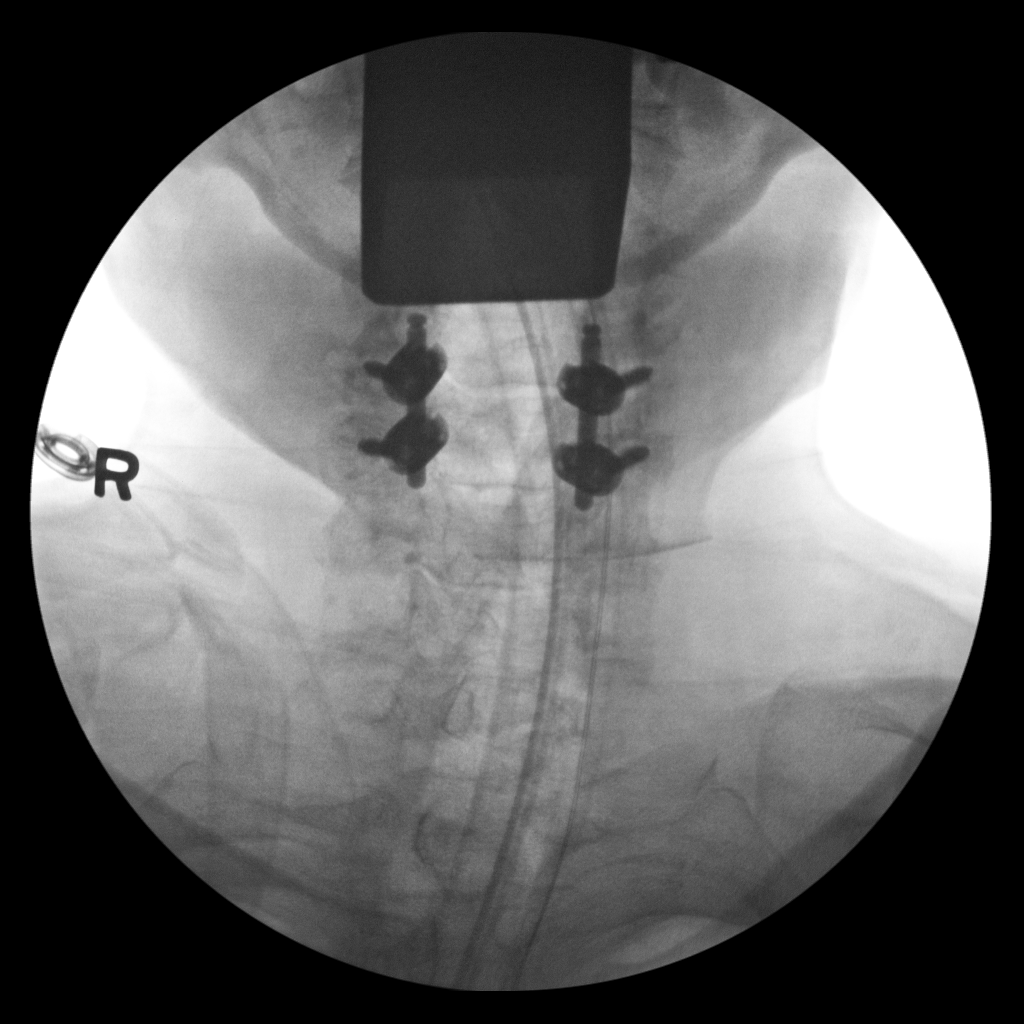
[im 3/3]
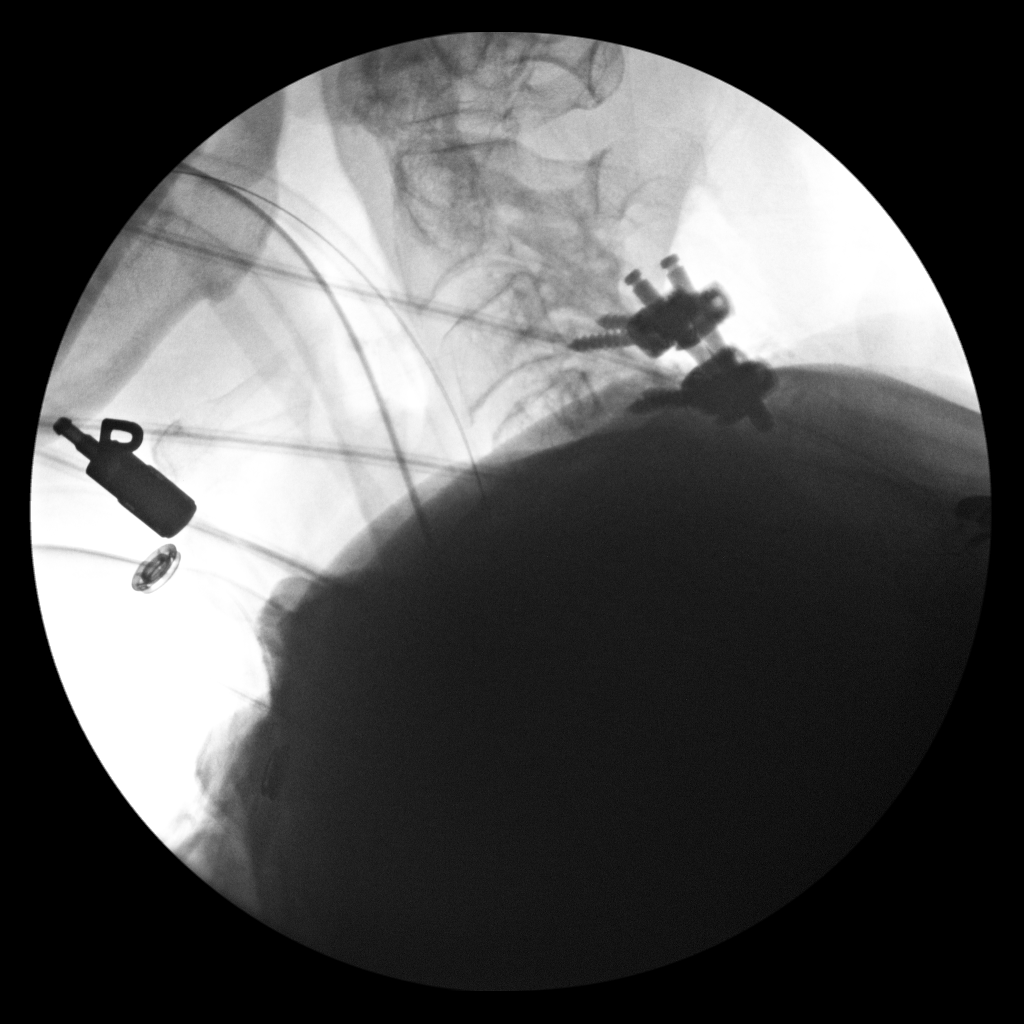

[3 of 3 positions shown; findings below may reference images not displayed]

FINDINGS: Intraoperative fluoroscopic images from C3-4 laminectomy and
posterior fusion demonstrate intrapedicular screws and plate C3-4
posterior fusion. No evidence of immediate complication on this
limited view of the cervical spine.

The patient is intubated.

Fluoroscopy time is recorded as 11 seconds.
IMPRESSION: Intraoperative radiograph from C3-4 posterior fusion.

## 2018-11-09 IMAGING — CT CT ANGIO CHEST
2 of 8 series · 19 of 36 positions shown · IV contrast (OMNI)
Comparison: Portable chest 0046 hours today.

CLINICAL DATA: 84-year-old male with weakness and shortness of
breath since 4244 hours.

EXAM:
CT ANGIOGRAPHY CHEST WITH CONTRAST
TECHNIQUE: Multidetector CT imaging of the chest was performed using the
standard protocol during bolus administration of intravenous
contrast. Multiplanar CT image reconstructions and MIPs were
obtained to evaluate the vascular anatomy.
CONTRAST:  100mL MGWGQC-TTK IOPAMIDOL (MGWGQC-TTK) INJECTION 76%

[Series 10: thins · axial · 0.62mm/px · z∈[+1038,+1310]mm · 18 of 303 slices shown]
[im 16/303  lung]
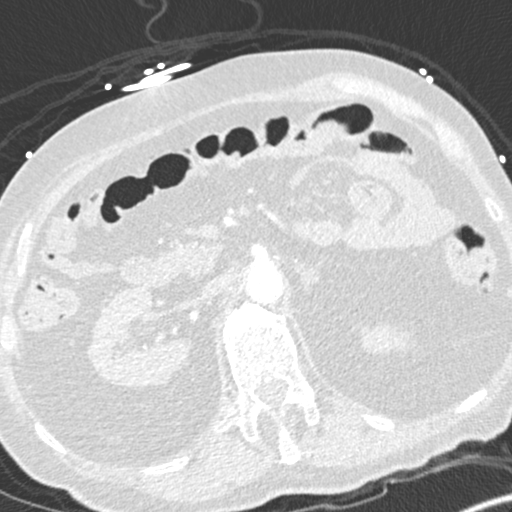
[im 32/303  mediastinal]
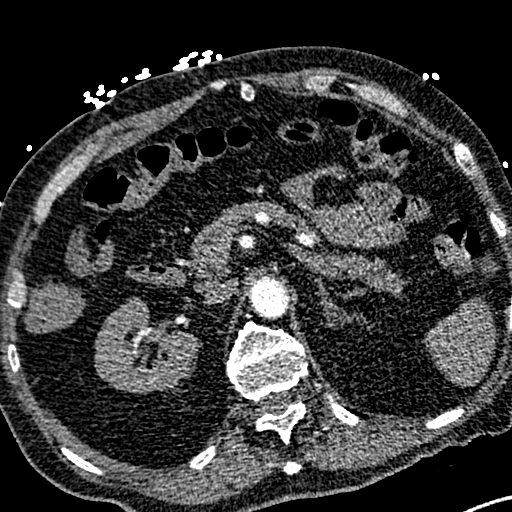
[im 48/303  lung]
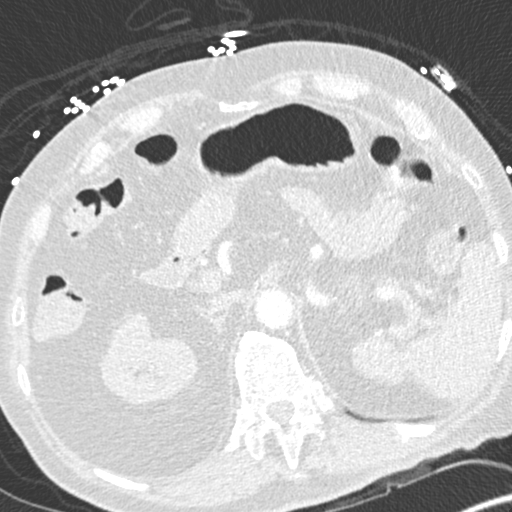
[im 64/303  mediastinal]
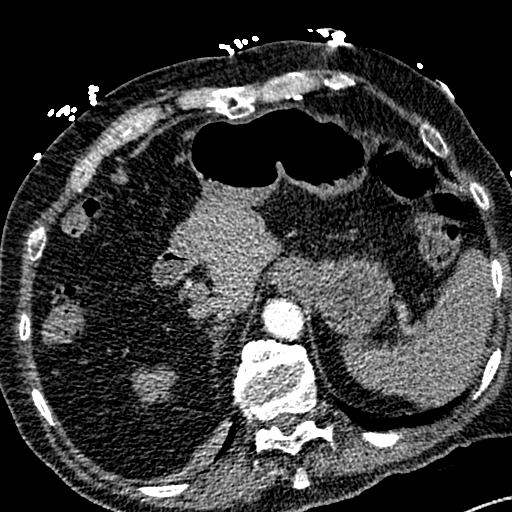
[im 80/303  lung]
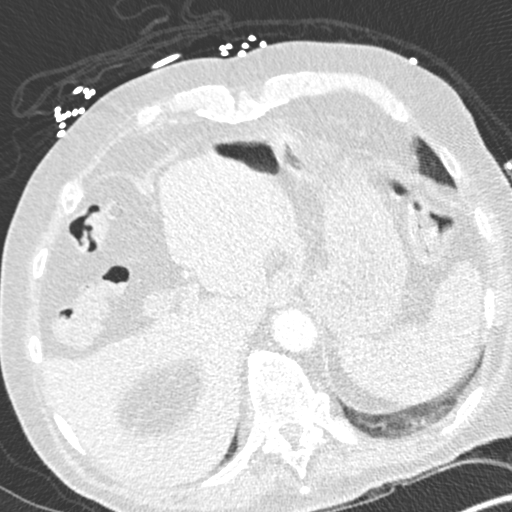
[im 96/303  mediastinal]
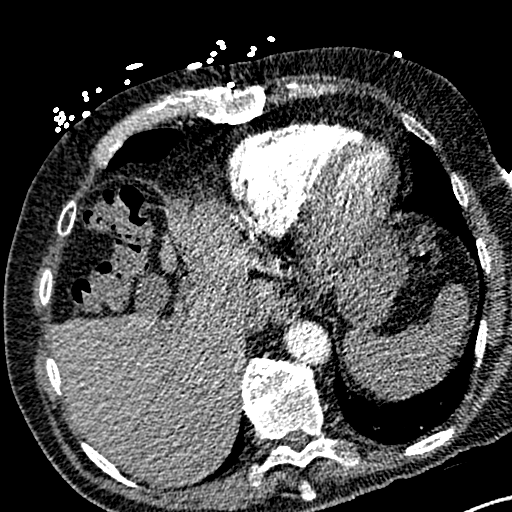
[im 112/303  lung]
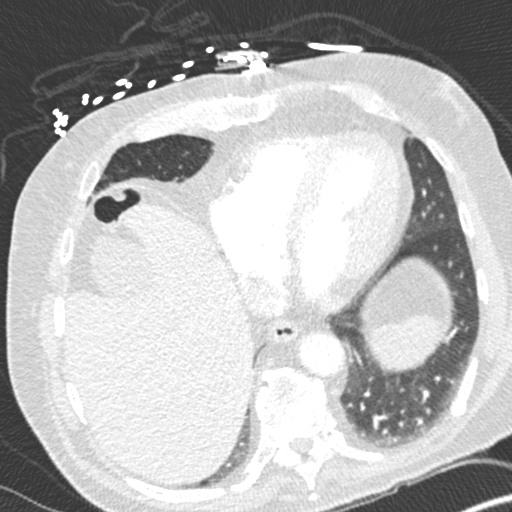
[im 128/303  mediastinal]
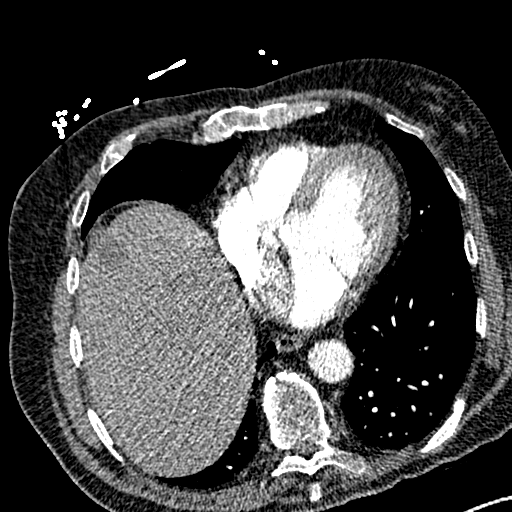
[im 144/303  lung]
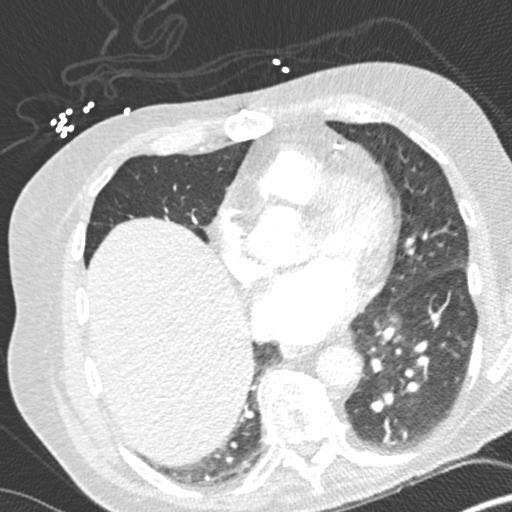
[im 159/303  mediastinal]
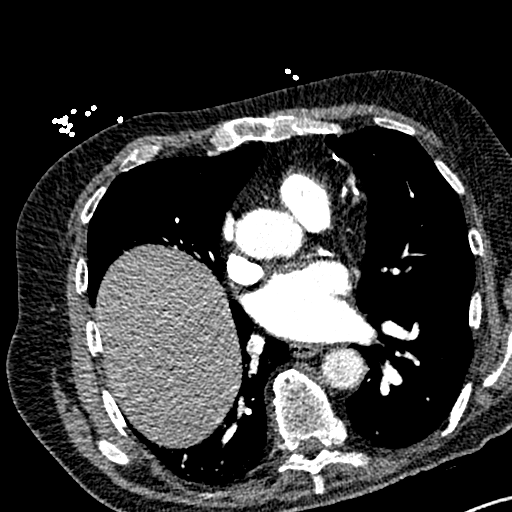
[im 175/303  lung]
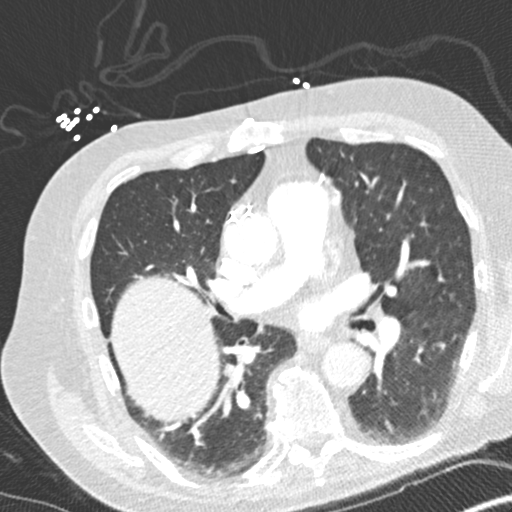
[im 191/303  mediastinal]
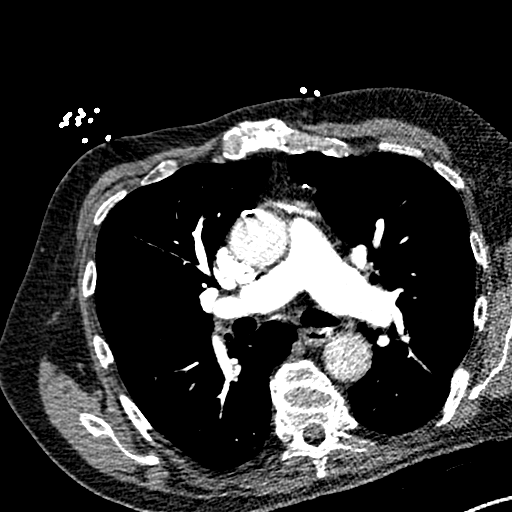
[im 207/303  lung]
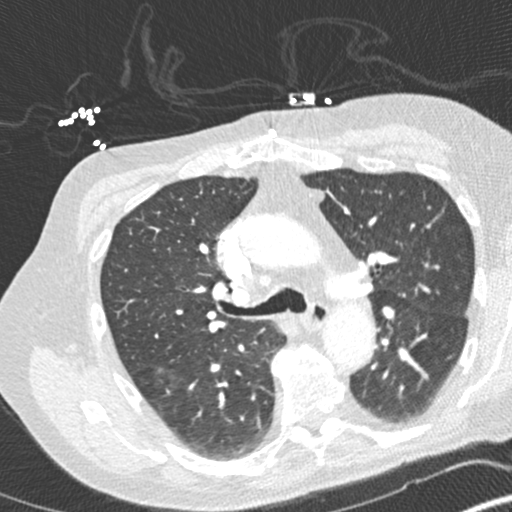
[im 223/303  mediastinal]
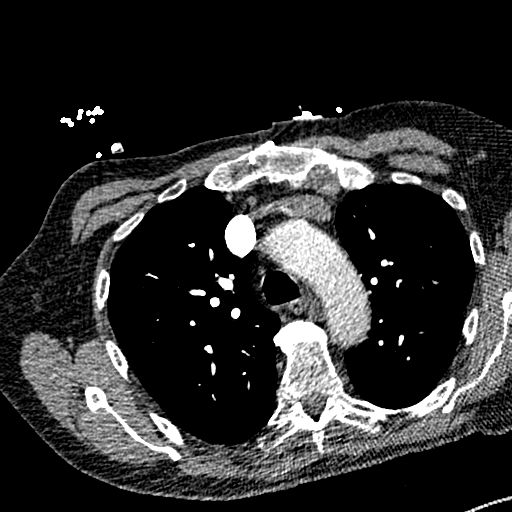
[im 239/303  lung]
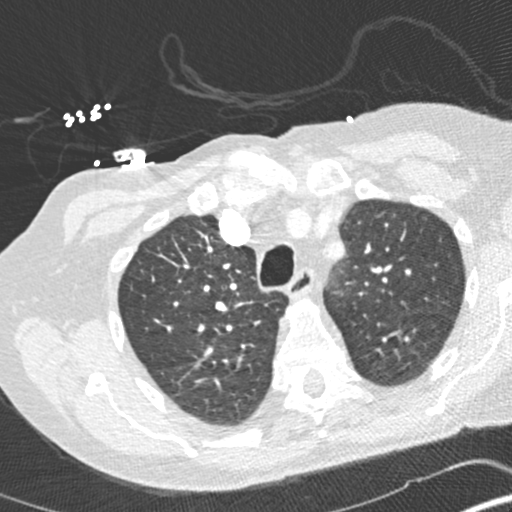
[im 255/303  mediastinal]
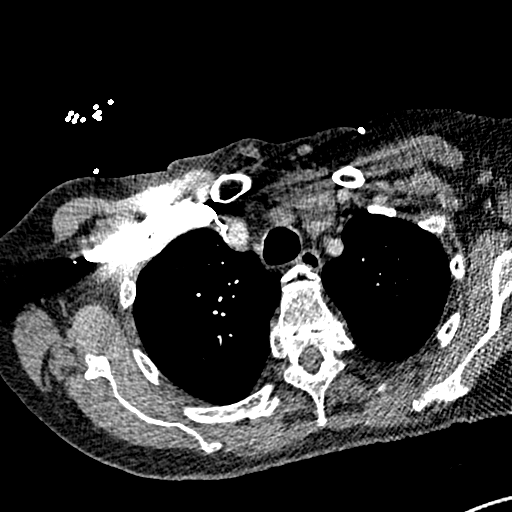
[im 271/303  lung]
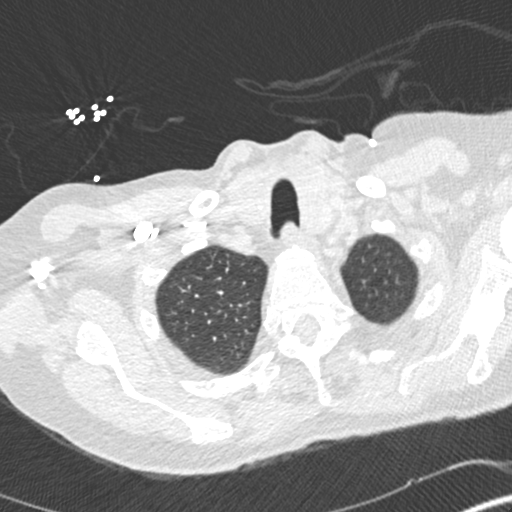
[im 287/303  mediastinal]
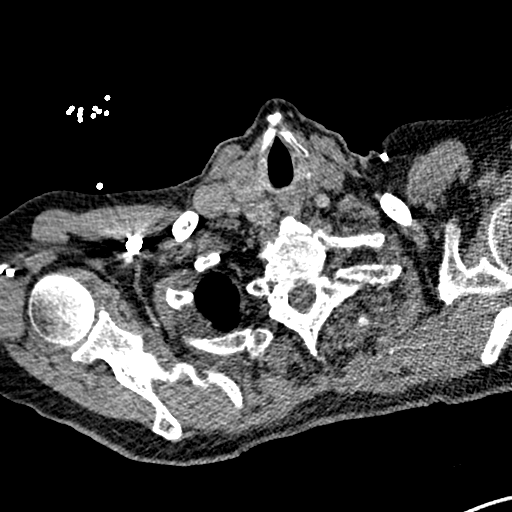

[Series 11: coronal mpr · coronal · 0.59mm/px · 1 of 147 slices shown]
[im 74/147  mediastinal]
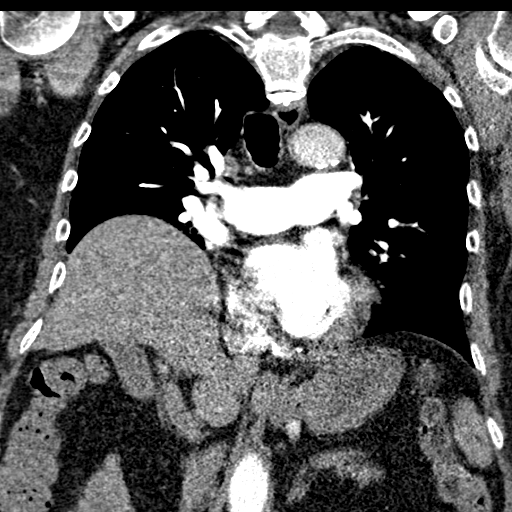

[19 of 36 positions shown; findings below may reference images not displayed]

FINDINGS: Cardiovascular: Good contrast bolus timing in the pulmonary arterial
tree.

No focal filling defect identified in the pulmonary arteries to
suggest acute pulmonary embolism.

Prior CABG. Negative visible aorta aside from atherosclerosis.
Cardiac size at the upper limits of normal. No pericardial effusion.

Mediastinum/Nodes: Thyromegaly, no significant mediastinal
involvement. No mediastinal lymphadenopathy.

Lungs/Pleura: Chronic elevation of the right hemidiaphragm.
Associated right lung base atelectasis. Major airways are patent.
Minor dependent atelectasis in the left lung. No other abnormal
pulmonary opacity. No pleural effusion.

Upper Abdomen: Negative visible liver, gallbladder, spleen,
pancreas, adrenal glands, kidneys, stomach and small bowel in the
upper abdomen. Negative visible colon aside from diverticulosis.

Musculoskeletal: Exaggerated thoracic kyphosis with flowing
osteophytes or syndesmophytes in the thoracic spine. Chronic left
posterior 7th and 9th rib fractures. Prior sternotomy. No acute
osseous abnormality identified.

Review of the MIP images confirms the above findings.
IMPRESSION: 1.  Negative for acute pulmonary embolus.
2. Chronic elevation of the right hemidiaphragm suggesting chronic
right phrenic nerve palsy. Mild atelectasis but no other acute
pulmonary finding.

## 2019-01-14 ENCOUNTER — Other Ambulatory Visit: Payer: Self-pay | Admitting: Cardiology

## 2019-01-16 ENCOUNTER — Telehealth: Payer: Self-pay | Admitting: Cardiology

## 2019-01-16 NOTE — Telephone Encounter (Signed)
Pt due for 6 month f/u. Please contact pt for future appointment. Pt needing refills.

## 2019-01-16 NOTE — Telephone Encounter (Signed)
LVM for patient to call and schedule followup with Dr. Ellyn Hack.

## 2019-03-09 ENCOUNTER — Other Ambulatory Visit: Payer: Self-pay

## 2019-03-09 MED ORDER — NITROGLYCERIN 0.4 MG SL SUBL: SUBLINGUAL_TABLET | SUBLINGUAL | 0 refills | Status: DC

## 2019-03-22 ENCOUNTER — Encounter: Payer: Self-pay | Admitting: Cardiology

## 2019-03-22 ENCOUNTER — Other Ambulatory Visit: Payer: Self-pay

## 2019-03-22 ENCOUNTER — Ambulatory Visit: Payer: Medicare Other | Admitting: Cardiology

## 2019-03-22 VITALS — BP 140/83 | HR 69 | Ht 75.0 in | Wt 188.8 lb

## 2019-03-22 DIAGNOSIS — I951 Orthostatic hypotension: Secondary | ICD-10-CM | POA: Diagnosis not present

## 2019-03-22 DIAGNOSIS — I10 Essential (primary) hypertension: Secondary | ICD-10-CM

## 2019-03-22 DIAGNOSIS — I2510 Atherosclerotic heart disease of native coronary artery without angina pectoris: Secondary | ICD-10-CM

## 2019-03-22 DIAGNOSIS — R002 Palpitations: Secondary | ICD-10-CM

## 2019-03-22 DIAGNOSIS — E785 Hyperlipidemia, unspecified: Secondary | ICD-10-CM | POA: Diagnosis not present

## 2019-03-22 MED ORDER — LISINOPRIL 5 MG PO TABS: 5.0000 mg | ORAL_TABLET | Freq: Every day | ORAL | 3 refills | Status: DC

## 2019-03-22 NOTE — Patient Instructions (Addendum)
Medication Instructions:   refilled lisinopril   If you need a refill on your cardiac medications before your next appointment, please call your pharmacy.   Lab work: Not needed .  Testing/Procedures: NOT NEEDED    Follow-Up: At [facility], you and your health needs are our priority.  As part of our continuing mission to provide you with exceptional heart care, we have created designated Provider Care Teams.  These Care Teams include your primary Cardiologist (physician) and Advanced Practice Providers (APPs -  Physician Assistants and Nurse Practitioners) who all work together to provide you with the care you need, when you need it. . You will need a follow up appointment in  6 months--April 2021.  Please call our office 2 months in advance to schedule this appointment.  You may see Glenetta Hew, MD or one of the following Advanced Practice Providers on your designated Care Team:   . Rosaria Ferries, PA-C . Jory Sims, DNP, ANP  Any Other Special Instructions Will Be Listed Below (If Applicable).

## 2019-03-22 NOTE — Progress Notes (Signed)
PCP: Geoffry ParadiseAronson, Richard, MD  Clinic Note: Chief Complaint  Patient presents with  . Follow-up  . Coronary Artery Disease    HPI:   Daniel [name] is a 85 y.o. male with CAD-CABG-PCI below who presents today for ~9 month f/u.   He is a former patient Dr. Julieanne MansonAlfred Little with a distant history of CAD  1997: Multivessel CAD-CABG for LM CAD -- evaluation was for exertional angina.   June 2008: PTCA of rPDA for what sounded like acute CHF Sx - dyspnea, no angina.   Last Cath 08/2015 - LIMA but the LAD fills via the SVG-D2. SVG-OM & rPDA stent patent. ->  Image reviewed below  He also has labile hypertension with some orthostatic hypotension.  Has recently been taken off of his ACE inhibitor and we reduced his p.m. dose of beta-blocker.  Recent Dx of R Retinal A occlusion - recommended continued Plavix.  Daniel [name] was last seen in January for pre-op evaluation.  Was doing pretty well compared to his previous visit.  Balance was better.  Knee was bothering her more than anything else.  Stable heart rates but it one episode of palpitations where he took an extra beta-blocker dose.  Recent Hospitalizations: None  Reviewed  CV studies:   The following studies were reviewed today: (if available, images/films reviewed: From Epic Chart or Care Everywhere) . None:   Interval History:   "Daniel [name]" returns here today overall doing fairly well.  He says that his blood pressure has been going up at home, so they have restarted his lisinopril.  He is now taking metoprolol 1 tablet in the morning and 1-1/2 in the evening.    He says he does have some occasional palpitations, but since we have increased his beta-blocker dose at night.  Main thing he notes is that he has been having issues with balance and peripheral neuropathy.  He also has some pain in his calves usually occurs at night.  Not with exertion.  Cardiovascular review of symptoms: no chest pain or dyspnea on exertion positive for -  palpitations, rapid heart rate and Poor balance with unsteady gait negative for - edema, orthopnea, paroxysmal nocturnal dyspnea, shortness of breath or Syncope/near syncope, TIA/amaurosis fugax.  Claudication.  The patient does not have symptoms concerning for COVID-19 infection (fever, chills, cough, or new shortness of breath).  The patient is practicing social distancing. +++ Masking.     REVIEWED OF SYSTEMS   ROS: A comprehensive was performed. Review of Systems  Constitutional: Positive for weight loss (Still losing weight despite the fact that he is eating what he describes as normal.). Negative for malaise/fatigue (Energy improving).  HENT: Negative for congestion and nosebleeds.   Respiratory: Negative for shortness of breath.   Gastrointestinal: Negative for blood in stool, constipation, heartburn, melena and nausea.  Genitourinary: Negative for dysuria and hematuria.  Musculoskeletal: Positive for myalgias (Nighttime calf cramping after has been active).  Neurological: Positive for tingling (Pedal neuropathy symptoms).  Endo/Heme/Allergies: Does not bruise/bleed easily.  Psychiatric/Behavioral: Negative.   All other systems reviewed and are negative.  Ophthalmologist is currently following his retinal vessels.  Concern for possible bleed  I have reviewed and (if needed) personally updated the patient's problem list, medications, allergies, past medical and surgical history, social and family history.   PAST MEDICAL HISTORY   Past Medical History:  Diagnosis Date  . Arthritis   . CAD of autologous arterial graft 2010   Atretic LIMA; Myoview Jan 2015: Possible  mild basal anteroseptal defect thought to be artifact (CRO early LAD ischemia due to inadeuate retrograde perfusion via SVG-Diag  . CAD S/P percutaneous coronary angioplasty June 2008;    a. 6/'08: PCI nongrafted distal RCA-PDA: Mini vision BMS 2.25 mm x 28 mm; b. Relook Cath 08/2015: Stable CAD. No culprit lesion.  Widely patent PDA stent. Patent SVG-OM 2, SVG-D2. Essentially atretic/small LIMA-LAD, but the LAD is perfused via SVG-D2  . Complication of anesthesia   . Coronary artery disease involving left main coronary artery 1997-2010   Last cath 2010: 70-80 % ostial left main, 80-90% LAD, subtotal occluded circumflex, RCA now a patent stent; SVG-diagonal backfills LAD, atretic LIMA-LAD -- medical therapy; stable by relook cath in March 2017  . Difficult intubation    VERY NARROW AIRWAY PER ANESTHESIOLOGIST (1997 CABG AT [facility])  . Dyslipidemia, goal LDL below 70   . Dysrhythmia   . Heart palpitations    Never fully diagnosed.  [name] History of kidney stones   . Hypertension   . Insomnia   . Mass in chest    JUST WATCHING , WILL CHECK OUT AFTER SURGERY  . S/P CABG x 3 1997   LIMA-LAD, SVG-D2, SVG-OM --> LIMA known to be atretic, but SVG-D2 fills diagonal and LAD  . Stenosis of cervical spine with myelopathy (HCC)   . Tingling    in fingers     PAST SURGICAL HISTORY   Past Surgical History:  Procedure Laterality Date  . 48-HOUR MONITOR  03/2017   Relatively normal.  Very infrequent ectopy.  Short PAT run with occasional PACs/PVCs and bigeminy.  No symptoms noted on monitor.  Longest PAT runs were less than 10 beats.  [name] BACK SURGERY     decompression  lower back   . CARDIAC CATHETERIZATION  June 2008   showed high grade distal RCA and PDA stenosis and underwent PCI with a 2.25 mm x 28 mm Mini Vision bare- metal stent.  [name] CARDIAC CATHETERIZATION  2010   Native coronaries at 70% to 80% ostial left main. LAD had diffuse 80% to 90% stenosis  . CARDIAC CATHETERIZATION N/A 08/08/2015   Procedure: Left Heart Cath and Cors/Grafts Angiography;  Surgeon: Marykay Lex, MD;  Location: [facility] INVASIVE CV LAB;  Service: Cardiovascular;  Laterality: N/A;  . COLONOSCOPY    . CORONARY ANGIOPLASTY WITH STENT PLACEMENT    . CORONARY ARTERY BYPASS GRAFT  [id number]   LIMA - LAD, SVG- diagonal, SVG-OM  . EYE SURGERY      BIL CATARACT  . KIDNEY STONE SURGERY    . KNEE ARTHROSCOPY     RT  KNEE  . NM MYOVIEW LTD  02/25/2004; Jan 2015   a) EF 57%; b)  Normal Nuclear Stress Test - No evidence of Ischemia or Infarction. Normal LV Function & wall motion.  [name] NM MYOVIEW LTD  03/17/2017   No significant reversible ischemia. Increased TID ratio 1.34. LVEF 54% with normal wall motion. This is a low risk study.  [name] POSTERIOR CERVICAL FUSION/FORAMINOTOMY N/A 07/14/2017   Procedure: LAMINECTOMY CERVICAL THREE- CERVICAL FOUR WITH LATERAL MASS FUSION;  Surgeon: Lisbeth Renshaw, MD;  Location: [facility] OR;  Service: Neurosurgery;  Laterality: N/A;  LAMINECTOMY CERVICAL 3- CERVICAL 4 WITH LATERAL MASS FUSION   Cath 08/2015: Stable from 2010    MEDICATIONS/ALLERGIES   Current Meds  Medication Sig  . acetaminophen (TYLENOL) 500 MG tablet Take 1,000 mg by mouth every 8 (eight) hours as needed for mild pain.  [name] azelastine (ASTELIN)  0.1 % nasal spray INSTILL 2 SPRAYS INTO EACH NOSTRIL TWICE A DAY  . budesonide (RHINOCORT AQUA) 32 MCG/ACT nasal spray Use 1 spray in each nostril once daily  . cholecalciferol (VITAMIN D) 1000 units tablet Take 1,000 Units by mouth daily.  . clopidogrel (PLAVIX) 75 MG tablet TAKE 1 TABLET BY MOUTH DAILY  . docusate sodium (COLACE) 100 MG capsule Take 200-300 mg by mouth daily as needed for mild constipation or moderate constipation.  [name] Histamine Dihydrochloride (AUSTRALIAN DREAM ARTHRITIS) 0.025 % CREA Apply 1 application topically as needed (for pain).  . isosorbide mononitrate (IMDUR) 30 MG 24 hr tablet Take 30 mg by mouth daily.  [name] lisinopril (ZESTRIL) 5 MG tablet Take 1 tablet (5 mg total) by mouth daily.  [name] loratadine (CLARITIN) 10 MG tablet Take 10 mg by mouth daily.  . metoprolol succinate (TOPROL-XL) 50 MG 24 hr tablet Take 25 mg  ( 1/2 tablet)n the morning and 50 mg  In the evening,may take an extra 25 mg if needed  . montelukast (SINGULAIR) 10 MG tablet Take 10 mg by mouth at bedtime.  .  Multiple Vitamin (MULTIVITAMIN WITH MINERALS) TABS tablet Take 1 tablet by mouth daily.  . nitroGLYCERIN (NITROSTAT) 0.4 MG SL tablet PLACE 1 TABLET UNDER THE TONGUE IF NEEDED EVERY 5 MINUTES FOR CHEST PAIN FOR 3 DOSES; IF NO RELIEF AFTER FIRST DOSE CALL PRESCRIBER OR 911.  . tamsulosin (FLOMAX) 0.4 MG CAPS capsule Take 0.4 mg by mouth at bedtime.   . traMADol (ULTRAM) 50 MG tablet Take 100 mg by mouth 2 (two) times daily.   . vitamin B-12 (CYANOCOBALAMIN) 1000 MCG tablet Take 1,000 mcg by mouth 2 (two) times daily.   . [DISCONTINUED] lisinopril (ZESTRIL) 5 MG tablet Take 5 mg by mouth daily.    Allergies  Allergen Reactions  . Penicillins Swelling and Other (See Comments)    Swelling and redness in joints  Has patient had a PCN reaction causing immediate rash, facial/tongue/throat swelling, SOB or lightheadedness with hypotension: No Has patient had a PCN reaction causing severe rash involving mucus membranes or skin necrosis: No Has patient had a PCN reaction that required hospitalization: No Has patient had a PCN reaction occurring within the last 10 years: No If all of the above answers are "NO", then may proceed with Cephalosporin use.   . Sulfa Antibiotics Other (See Comments)    Pain in left side underneath ribcage-pancreas  . Statins Other (See Comments)    UNSPECIFIED REACTION  "INTOLERANCE AT HIGH DOSES"     SOCIAL HISTORY/FAMILY HISTORY   Social History   Tobacco Use  . Smoking status: Never Smoker  . Smokeless tobacco: Never Used  Substance Use Topics  . Alcohol use: No  . Drug use: No   Social History   Social History Narrative   He is a married father of 2, grandfather 5. He is retired from [employer], Psychologist, educational. 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  He is usually very active, but is limited as far as standing exercise by his knee osteoarthritis.   He does not drink and does  not smoke. Never smoked.  Live with wife in a one story home.  Education: college.   --> Most notable event since last visit other than his surgery was that he is now having to be the primary caregiver for his wife who is diagnosed with cancer and is having issues with some  dementia.  family history includes Arthritis in his mother; Heart attack (age of onset: 71) in his father; Heart disease in his brother and mother; Other in his sister; Stroke (age of onset: 2) in his mother.   OBJCTIVE -PE, EKG, labs   Wt Readings from Last 3 Encounters:  03/22/19 188 lb 12.8 oz (85.6 kg)  06/20/18 204 lb 3.2 oz (92.6 kg)  05/18/18 198 lb 9.6 oz (90.1 kg)    Physical Exam: BP 140/83   Pulse 69   Ht  (1.905 m)   Wt 188 lb 12.8 oz (85.6 kg)   SpO2 99%   BMI 23.60 kg/m  Physical Exam  Constitutional: He is oriented to person, place, and time. He appears well-developed and well-nourished. No distress.  Healthy-appearing.  Very tall gentleman.  Well-groomed.  HENT:  Head: Normocephalic and atraumatic.  Neck: Normal range of motion. Neck supple. No hepatojugular reflux and no JVD present. Carotid bruit is not present.  Cardiovascular: Normal rate, regular rhythm, normal heart sounds and normal pulses.  No extrasystoles are present. PMI is not displaced. Exam reveals no gallop and no friction rub.  No murmur heard. Pulmonary/Chest: Effort normal and breath sounds normal. No respiratory distress. He has no wheezes. He has no rales.  Abdominal: Soft. Bowel sounds are normal. He exhibits no distension. There is no abdominal tenderness. There is no rebound.  Musculoskeletal: Normal range of motion.        General: No edema.  Neurological: He is alert and oriented to person, place, and time. No cranial nerve deficit.  Psychiatric: He has a normal mood and affect. His behavior is normal. Judgment and thought content normal.  Vitals reviewed.    Adult ECG Report n/a  Recent Labs:  Feb 2020  TC  191, TG 177, HDL 37, LDL 119; Cr 1.0, K+ 4.4. A1c 5.7.    ASSESSMENT/PLAN    Problem List Items Addressed This Visit    LM-CAD - atretic LIMA-LAD, patent SVG-D2-> fills LAD & SVG-OM, BMS PCI rPDA (Chronic)    Longstanding CAD with left main disease diagnosed back in 1997 unfortunately his atretic LIMA became atretic in the LAD feels retrograde via the SVG-D2.  The SVG-OM was patent by last cath along with patent PDA.  He is now on Plavix without aspirin (which may need to be held if there is concern with retinal artery, however he was previously told to stay on it by his ophthalmologist). On stable dose of beta-blocker with readded ACE inhibitor. Is also on Imdur for antianginal benefit.   --He has previously indicated that he would rather not discuss his lipids.       Relevant Medications   lisinopril (ZESTRIL) 5 MG tablet   Essential hypertension (Chronic)    Interestingly, his blood pressure has gone up.  I am a little bit confused about what is doing with his metoprolol appears to be taking Toprol twice daily with 50 mg in the morning and 75 mg in the evening. His blood pressure is now back up until he started taking his lisinopril has not had any orthostatic symptoms.  Current blood pressure is probably where we want to be for goal.      Relevant Medications   lisinopril (ZESTRIL) 5 MG tablet   Dyslipidemia, goal LDL below 70 (Chronic)    He finally stopped his statin even at low dose.  Does not want to talk about other medications.  At this point, I will respect his wishes.  Relevant Medications   lisinopril (ZESTRIL) 5 MG tablet   Orthostatic hypotension - Primary (Chronic)    This seems to have improved.  Blood pressure itself seems to be increasing.  He is now taking Toprol twice a day along with lisinopril and his blood pressure is higher.  Continue to monitor.      Relevant Medications   lisinopril (ZESTRIL) 5 MG tablet   Heart palpitations (Chronic)    He  has been having to use as needed additional dose of beta-blocker for palpitations and now stable on his current dosing of Toprol.         COVID-19 Education: The signs and symptoms of COVID-19 were discussed with the patient and how to seek care for testing (follow up with PCP or arrange E-visit).   The importance of social distancing was discussed today.  I spent a total of 20 minutes with the patient and chart review. >  50% of the time was spent in direct patient consultation.  Additional time spent with chart review (studies, outside notes, etc): 5 Total Time: 25 min  Current medicines are reviewed at length with the patient today.  (+/- concerns) not sure what to do with BP meds.   Patient Instructions / Medication Changes & Studies & Tests Ordered   Patient Instructions  Medication Instructions:   refilled lisinopril   If you need a refill on your cardiac medications before your next appointment, please call your pharmacy.   Lab work: Not needed .  Testing/Procedures: NOT NEEDED    Follow-Up: At [facility], you and your health needs are our priority.  As part of our continuing mission to provide you with exceptional heart care, we have created designated Provider Care Teams.  These Care Teams include your primary Cardiologist (physician) and Advanced Practice Providers (APPs -  Physician Assistants and Nurse Practitioners) who all work together to provide you with the care you need, when you need it. . You will need a follow up appointment in  6 months--April 2021.  Please call our office 2 months in advance to schedule this appointment.  You may see Glenetta Hew, MD or one of the following Advanced Practice Providers on your designated Care Team:   . Rosaria Ferries, PA-C . Jory Sims, DNP, ANP  Any Other Special Instructions Will Be Listed Below (If Applicable).   Studies Ordered:   No orders of the defined types were placed in this encounter.     Glenetta Hew, M.D., M.S. Interventional Cardiologist   Pager # [phone] Phone # [phone] [address] [place], [place] [address]   Thank you for choosing Heartcare at [facility]!!

## 2019-03-24 ENCOUNTER — Encounter: Payer: Self-pay | Admitting: Cardiology

## 2019-03-24 NOTE — Assessment & Plan Note (Signed)
Interestingly, his blood pressure has gone up.  I am a little bit confused about what is doing with his metoprolol appears to be taking Toprol twice daily with 50 mg in the morning and 75 mg in the evening. His blood pressure is now back up until he started taking his lisinopril has not had any orthostatic symptoms.  Current blood pressure is probably where we want to be for goal.

## 2019-03-24 NOTE — Assessment & Plan Note (Signed)
He has been having to use as needed additional dose of beta-blocker for palpitations and now stable on his current dosing of Toprol.

## 2019-03-24 NOTE — Assessment & Plan Note (Signed)
He finally stopped his statin even at low dose.  Does not want to talk about other medications.  At this point, I will respect his wishes.

## 2019-03-24 NOTE — Assessment & Plan Note (Signed)
This seems to have improved.  Blood pressure itself seems to be increasing.  He is now taking Toprol twice a day along with lisinopril and his blood pressure is higher.  Continue to monitor.

## 2019-03-24 NOTE — Assessment & Plan Note (Addendum)
Longstanding CAD with left main disease diagnosed back in 1997 unfortunately his atretic LIMA became atretic in the LAD feels retrograde via the SVG-D2.  The SVG-OM was patent by last cath along with patent PDA.  He is now on Plavix without aspirin (which may need to be held if there is concern with retinal artery, however he was previously told to stay on it by his ophthalmologist). On stable dose of beta-blocker with readded ACE inhibitor. Is also on Imdur for antianginal benefit.   --He has previously indicated that he would rather not discuss his lipids.

## 2019-04-23 ENCOUNTER — Other Ambulatory Visit: Payer: Self-pay | Admitting: Cardiology

## 2019-04-25 ENCOUNTER — Other Ambulatory Visit: Payer: Self-pay

## 2019-04-25 MED ORDER — CLOPIDOGREL BISULFATE 75 MG PO TABS: 75.0000 mg | ORAL_TABLET | Freq: Every day | ORAL | 3 refills | Status: DC

## 2019-07-10 ENCOUNTER — Ambulatory Visit: Payer: Medicare Other | Admitting: Allergy and Immunology

## 2019-07-10 ENCOUNTER — Encounter: Payer: Self-pay | Admitting: Allergy and Immunology

## 2019-07-10 ENCOUNTER — Other Ambulatory Visit: Payer: Self-pay

## 2019-07-10 VITALS — BP 136/84 | HR 74 | Temp 98.4°F | Resp 18 | Ht 75.0 in | Wt 184.0 lb

## 2019-07-10 DIAGNOSIS — J3089 Other allergic rhinitis: Secondary | ICD-10-CM | POA: Diagnosis not present

## 2019-07-10 DIAGNOSIS — J324 Chronic pansinusitis: Secondary | ICD-10-CM

## 2019-07-10 MED ORDER — CLINDAMYCIN HCL 150 MG PO CAPS: 150.0000 mg | ORAL_CAPSULE | Freq: Three times a day (TID) | ORAL | 0 refills | Status: AC

## 2019-07-10 MED ORDER — METHYLPREDNISOLONE ACETATE 80 MG/ML IJ SUSP
80.0000 mg | Freq: Once | INTRAMUSCULAR | Status: AC
  Administered 2019-07-10: 12:00:00 80 mg via INTRAMUSCULAR

## 2019-07-10 NOTE — Patient Instructions (Addendum)
  1. Continue Nasacort one spray each nostril once a day.  2. Continue Montelukast 10 mg one tablet once a day  3. Continue Azelastine 2 sprays each nostril 1-2 times per day  4. Can continue nasal saline washes  5.  For this prolonged upper airway issue use the following:   A.  Depo-Medrol 80 IM delivered in clinic today  B. Clindamycin 150 - 1 tablet 3 times a day for 20 days  6. Return to clinic in 12 months or earlier if problem

## 2019-07-10 NOTE — Progress Notes (Signed)
[place] [facility] [place] [facility] [place] [facility] [place] [facility] [place]   Follow-up Note  Referring Provider: Geoffry Paradise, MD Primary Provider: Geoffry Paradise, MD Date of Office Visit: 07/10/2019  Subjective:   Daniel [name] (DOB: 1932-06-26) is a 86 y.o. male who [facility] in re-evaluation of the following:  HPI: Daniel [name] returns to this clinic in reevaluation of allergic rhinoconjunctivitis and history of chronic sinusitis. I last saw him in the clinic 11 July 2018.  Approximately 1 month ago he developed very significant nasal congestion and postnasal drip and left-sided nasal drainage that is green to yellow and lots of drainage in his throat for which he was treated with a Z-Pak which did not help him at all.  He then took a home supply of ciprofloxacin which did not help him at all.  Prior to this point time he was doing okay while using Nasacort and azelastine and montelukast on a consistent basis.  He did obtain the flu vaccine this year.  His wife passed away from a prolong bout of cancer 10 days ago.  Allergies as of 07/10/2019      Reactions   Penicillins Swelling, Other (See Comments)   Swelling and redness in joints Has patient had a PCN reaction causing immediate rash, facial/tongue/throat swelling, SOB or lightheadedness with hypotension: No Has patient had a PCN reaction causing severe rash involving mucus membranes or skin necrosis: No Has patient had a PCN reaction that required hospitalization: No Has patient had a PCN reaction occurring within the last 10 years: No If all of the above answers are "NO", then may proceed with Cephalosporin use.   Sulfa Antibiotics Other (See Comments)   Pain in left side underneath ribcage-pancreas   Statins Other (See Comments)   UNSPECIFIED REACTION  "INTOLERANCE AT HIGH DOSES"      Medication List      acetaminophen 500 MG tablet Commonly known as: TYLENOL Take 1,000 mg by mouth  every 8 (eight) hours as needed for mild pain.   [place] Dream Arthritis 0.025 % Crea Generic drug: Histamine Dihydrochloride Apply 1 application topically as needed (for pain).   azelastine 0.1 % nasal spray Commonly known as: ASTELIN INSTILL 2 SPRAYS INTO EACH NOSTRIL TWICE A DAY   budesonide 32 MCG/ACT nasal spray Commonly known as: RHINOCORT AQUA Use 1 spray in each nostril once daily   cholecalciferol 1000 units tablet Commonly known as: VITAMIN D Take 1,000 Units by mouth daily.   clindamycin 150 MG capsule Commonly known as: Cleocin Take 1 capsule (150 mg total) by mouth 3 (three) times daily for 20 days. Started by: Jessica Priest, MD   clopidogrel 75 MG tablet Commonly known as: PLAVIX Take 1 tablet (75 mg total) by mouth daily.   docusate sodium 100 MG capsule Commonly known as: COLACE Take 200-300 mg by mouth daily as needed for mild constipation or moderate constipation.   isosorbide mononitrate 30 MG 24 hr tablet Commonly known as: IMDUR Take 30 mg by mouth daily.   lisinopril 5 MG tablet Commonly known as: ZESTRIL Take 1 tablet (5 mg total) by mouth daily.   loratadine 10 MG tablet Commonly known as: CLARITIN Take 10 mg by mouth daily.   metoprolol succinate 50 MG 24 hr tablet Commonly known as: TOPROL-XL Take 25 mg  ( 1/2 tablet)n the morning and 50 mg  In the evening,may take an extra 25 mg if needed   montelukast 10 MG tablet  Commonly known as: SINGULAIR Take 10 mg by mouth at bedtime.   multivitamin with minerals Tabs tablet Take 1 tablet by mouth daily.   nitroGLYCERIN 0.4 MG SL tablet Commonly known as: NITROSTAT PLACE 1 TABLET UNDER THE TONGUE IF NEEDED EVERY 5 MINUTES FOR CHEST PAIN FOR 3 DOSES; IF NO RELIEF AFTER FIRST DOSE CALL PRESCRIBER OR 911.   tamsulosin 0.4 MG Caps capsule Commonly known as: FLOMAX Take 0.4 mg by mouth at bedtime.   traMADol 50 MG tablet Commonly known as: ULTRAM Take 100 mg by mouth 2 (two) times daily.    vitamin B-12 1000 MCG tablet Commonly known as: CYANOCOBALAMIN Take 1,000 mcg by mouth 2 (two) times daily.       Past Medical History:  Diagnosis Date  . Arthritis   . CAD of autologous arterial graft 2010   Atretic LIMA; Myoview Jan 2015: Possible mild basal anteroseptal defect thought to be artifact (CRO early LAD ischemia due to inadeuate retrograde perfusion via SVG-Diag  . CAD S/P percutaneous coronary angioplasty June 2008;    a. 6/'08: PCI nongrafted distal RCA-PDA: Mini vision BMS 2.25 mm x 28 mm; b. Relook Cath 08/2015: Stable CAD. No culprit lesion. Widely patent PDA stent. Patent SVG-OM 2, SVG-D2. Essentially atretic/small LIMA-LAD, but the LAD is perfused via SVG-D2  . Complication of anesthesia   . Coronary artery disease involving left main coronary artery 1997-2010   Last cath 2010: 70-80 % ostial left main, 80-90% LAD, subtotal occluded circumflex, RCA now a patent stent; SVG-diagonal backfills LAD, atretic LIMA-LAD -- medical therapy; stable by relook cath in March 2017  . Difficult intubation    VERY NARROW AIRWAY PER ANESTHESIOLOGIST (1997 CABG AT [facility])  . Dyslipidemia, goal LDL below 70   . Dysrhythmia   . Heart palpitations    Never fully diagnosed.  [name] History of kidney stones   . Hypertension   . Insomnia   . Mass in chest    JUST WATCHING , WILL CHECK OUT AFTER SURGERY  . S/P CABG x 3 1997   LIMA-LAD, SVG-D2, SVG-OM --> LIMA known to be atretic, but SVG-D2 fills diagonal and LAD  . Stenosis of cervical spine with myelopathy ([facility])   . Tingling    in fingers    Past Surgical History:  Procedure Laterality Date  . 48-HOUR MONITOR  03/2017   Relatively normal.  Very infrequent ectopy.  Short PAT run with occasional PACs/PVCs and bigeminy.  No symptoms noted on monitor.  Longest PAT runs were less than 10 beats.  [name] BACK SURGERY     decompression  lower back   . CARDIAC CATHETERIZATION  June 2008   showed high grade distal RCA and PDA stenosis and  underwent PCI with a 2.25 mm x 28 mm Mini Vision bare- metal stent.  [name] CARDIAC CATHETERIZATION  2010   Native coronaries at 70% to 80% ostial left main. LAD had diffuse 80% to 90% stenosis  . CARDIAC CATHETERIZATION N/A 08/08/2015   Procedure: Left Heart Cath and Cors/Grafts Angiography;  Surgeon: Leonie Man, MD;  Location: [facility];  Service: Cardiovascular;  Laterality: N/A;  . COLONOSCOPY    . CORONARY ANGIOPLASTY WITH STENT PLACEMENT    . CORONARY ARTERY BYPASS GRAFT  [id number]   LIMA - LAD, SVG- diagonal, SVG-OM  . EYE SURGERY     BIL CATARACT  . KIDNEY STONE SURGERY    . KNEE ARTHROSCOPY     RT  KNEE  . NM MYOVIEW  LTD  02/25/2004; Jan 2015   a) EF 57%; b)  Normal Nuclear Stress Test - No evidence of Ischemia or Infarction. Normal LV Function & wall motion.  [name] NM MYOVIEW LTD  03/17/2017   No significant reversible ischemia. Increased TID ratio 1.34. LVEF 54% with normal wall motion. This is a low risk study.  [name] POSTERIOR CERVICAL FUSION/FORAMINOTOMY N/A 07/14/2017   Procedure: LAMINECTOMY CERVICAL THREE- CERVICAL FOUR WITH LATERAL MASS FUSION;  Surgeon: Lisbeth Renshaw, MD;  Location: [facility];  Service: Neurosurgery;  Laterality: N/A;  LAMINECTOMY CERVICAL 3- CERVICAL 4 WITH LATERAL MASS FUSION    Review of systems negative except as noted in HPI / PMHx or noted below:  Review of Systems  Constitutional: Negative.   HENT: Negative.   Eyes: Negative.   Respiratory: Negative.   Cardiovascular: Negative.   Gastrointestinal: Negative.   Genitourinary: Negative.   Musculoskeletal: Negative.   Skin: Negative.   Neurological: Negative.   Endo/Heme/Allergies: Negative.   Psychiatric/Behavioral: Negative.      Objective:   Vitals:   07/10/19 1033  BP: 136/84  Pulse: 74  Resp: 18  Temp: 98.4 F (36.9 C)  SpO2: 97%   Height: 6\' 3"  (190.5 cm)  Weight: 184 lb (83.5 kg)   Physical Exam Constitutional:      Appearance: He is not diaphoretic.     Comments:  Nasal voice  HENT:     Head: Normocephalic.     Right Ear: Tympanic membrane, ear canal and external ear normal.     Left Ear: Tympanic membrane, ear canal and external ear normal.     Nose: Nose normal. No mucosal edema or rhinorrhea.     Mouth/Throat:     Pharynx: Uvula midline. No oropharyngeal exudate.  Eyes:     Conjunctiva/sclera: Conjunctivae normal.  Neck:     Thyroid: No thyromegaly.     Trachea: Trachea normal. No tracheal tenderness or tracheal deviation.  Cardiovascular:     Rate and Rhythm: Normal rate and regular rhythm.     Heart sounds: Normal heart sounds, S1 normal and S2 normal. No murmur.  Pulmonary:     Effort: No respiratory distress.     Breath sounds: Normal breath sounds. No stridor. No wheezing or rales.  Lymphadenopathy:     Head:     Right side of head: No tonsillar adenopathy.     Left side of head: No tonsillar adenopathy.     Cervical: No cervical adenopathy.  Skin:    Findings: No erythema or rash.     Nails: There is no clubbing.  Neurological:     Mental Status: He is alert.     Diagnostics: none  Assessment and Plan:   1. Chronic pansinusitis   2. Other allergic rhinitis     1. Continue Nasacort one spray each nostril once a day.  2. Continue Montelukast 10 mg one tablet once a day  3. Continue Azelastine 2 sprays each nostril 1-2 times per day  4. Can continue nasal saline washes  5.  For this prolonged upper airway issue use the following:   A.  Depo-Medrol 80 IM delivered in clinic today  B. Clindamycin 150 - 1 tablet 3 times a day for 20 days  6. Return to clinic in 12 months or earlier if problem   I will treat with a course of broad-spectrum antibiotic for the next 20 days as well as a systemic steroid while he continues on other anti-inflammatory agents for his airway  as noted above.  Assuming he does well with this plan I will see him back in his clinic in 12 months or earlier if there is a problem.  Laurette Schimke,  MD Allergy / Immunology [facility]

## 2019-07-11 ENCOUNTER — Encounter: Payer: Self-pay | Admitting: Allergy and Immunology

## 2019-07-11 ENCOUNTER — Telehealth: Payer: Self-pay | Admitting: Cardiology

## 2019-07-11 NOTE — Telephone Encounter (Signed)
We are recommending the COVID-19 vaccine to all of our patients. Cardiac medications (including blood thinners) should not deter anyone from being vaccinated and there is no need to hold any of those medications prior to vaccine administration.     Currently, there is a hotline to call (active 06/15/19) to schedule vaccination appointments as no walk-ins will be accepted.   Number: [phone].    If an appointment is not available please go to [email or web address] to sign up for notification when additional vaccine appointments are available.   If you have further questions or concerns about the vaccine process, please visit [email or web address] or contact your primary care physician.   

## 2019-08-24 ENCOUNTER — Other Ambulatory Visit: Payer: Self-pay | Admitting: Cardiology

## 2019-08-25 ENCOUNTER — Other Ambulatory Visit: Payer: Self-pay | Admitting: Allergy and Immunology

## 2019-09-18 ENCOUNTER — Other Ambulatory Visit: Payer: Self-pay

## 2019-09-18 ENCOUNTER — Ambulatory Visit: Payer: Medicare Other | Admitting: Cardiology

## 2019-09-18 ENCOUNTER — Encounter: Payer: Self-pay | Admitting: Cardiology

## 2019-09-18 VITALS — BP 120/74 | HR 65 | Ht 75.0 in | Wt 193.4 lb

## 2019-09-18 DIAGNOSIS — E785 Hyperlipidemia, unspecified: Secondary | ICD-10-CM | POA: Diagnosis not present

## 2019-09-18 DIAGNOSIS — R002 Palpitations: Secondary | ICD-10-CM | POA: Diagnosis not present

## 2019-09-18 DIAGNOSIS — I951 Orthostatic hypotension: Secondary | ICD-10-CM

## 2019-09-18 DIAGNOSIS — I10 Essential (primary) hypertension: Secondary | ICD-10-CM

## 2019-09-18 DIAGNOSIS — Z951 Presence of aortocoronary bypass graft: Secondary | ICD-10-CM | POA: Diagnosis not present

## 2019-09-18 DIAGNOSIS — I2510 Atherosclerotic heart disease of native coronary artery without angina pectoris: Secondary | ICD-10-CM | POA: Diagnosis not present

## 2019-09-18 DIAGNOSIS — R0609 Other forms of dyspnea: Secondary | ICD-10-CM

## 2019-09-18 DIAGNOSIS — R0600 Dyspnea, unspecified: Secondary | ICD-10-CM | POA: Diagnosis not present

## 2019-09-18 DIAGNOSIS — R5383 Other fatigue: Secondary | ICD-10-CM

## 2019-09-18 MED ORDER — NITROGLYCERIN 0.4 MG SL SUBL: SUBLINGUAL_TABLET | SUBLINGUAL | 2 refills | Status: DC

## 2019-09-18 NOTE — Progress Notes (Signed)
Primary Care Provider: Geoffry Paradise, MD Cardiologist: Bryan Lemma, MD Electrophysiologist: None  Clinic Note: Chief Complaint  Patient presents with  . Follow-up    Doing well.  No active symptoms.   HPI:    Daniel [name] is a 86 y.o. male with a PMH below who presents today for 6 month f/u  He is a former patient Dr. Julieanne Manson with a distant history of CAD  1997: Multivessel CAD-CABG for LM CAD -- evaluation was for exertional angina.   June 2008: PTCA of rPDA for what sounded like acute CHF Sx - dyspnea, no angina.             Last Cath 08/2015 - LIMA but the LAD fills via the SVG-D2. SVG-OM & rPDA stent patent. ->  Image reviewed below  He also has labile hypertension with some orthostatic hypotension. Has recently been taken off of his ACE inhibitor and we reduced his p.m. dose of beta-blocker.  Recent Dx of R Retinal Vein occlusion - recommended continued Plavix.  Daniel [name] was last seen on March 22, 2019.  Doing fairly well.  Blood pressure has been increasing at home so they restarted lisinopril. -> Palpitations better controlled since increasing beta-blocker at night.  Still having lots of balance issues. -> plan was 6 month f/u.  Recent Hospitalizations: n/a  Reviewed  CV studies:    The following studies were reviewed today: (if available, images/films reviewed: From Epic Chart or Care Everywhere) . n/a:   Interval History:   Daniel [name] returns today overall doing fairly well.  He is a little bit subdued.  His wife of over 50 years passed away in 06/19/22 after long bout with cancer.  He is now getting used to being in the house by himself.  He knows he probably will scale down, but is having a hard time coming to grips with that.  He does note that his get up and go a little bit decreased, but otherwise is doing fairly well.  No chest pain or pressure with rest or exertion.  No heart failure symptoms of PND, orthopnea or edema.  Energy level  is better and palpitations are improved.  He says he feels pretty good most of the day on most days.  He obviously has some down days.  More because of feeling down.  He went out this past weekend and mowed his lawn, but indicates that is not that difficult because he rides a mower.  CV Review of Symptoms (Summary): no chest pain or dyspnea on exertion positive for - Pretty controlled palpitations may be mild fatigue. negative for - edema, loss of consciousness, orthopnea, paroxysmal nocturnal dyspnea, rapid heart rate, shortness of breath or Syncope/near syncope, TIA/amaurosis fugax, claudication  The patient does not have symptoms concerning for COVID-19 infection (fever, chills, cough, or new shortness of breath).  The patient is practicing social distancing & Masking.   He has had both of his COVID-19 vaccine injections.  Pfizer vaccine completed March  REVIEWED OF SYSTEMS   Review of Systems  Constitutional: Positive for malaise/fatigue (May be a bit of mildly decreased get up and go.  But better overall.). Negative for weight loss.  HENT: Negative for congestion and nosebleeds.   Respiratory: Negative for shortness of breath.   Gastrointestinal: Negative for abdominal pain, blood in stool and melena.  Genitourinary: Negative for hematuria.  Musculoskeletal: Negative for joint pain (Mild aches and pains with nothing major).  Not as bad nighttime cramps  Neurological: Positive for tingling (He does have some pedal neuropathy.). Negative for dizziness and headaches.  Psychiatric/Behavioral: Negative for memory loss. The patient is not nervous/anxious and does not have insomnia.        Clearly is somewhat down, morning his wife's death.  Seems to be managing relatively well though.  He was tearful near the end of the visit    PAST MEDICAL HISTORY   Past Medical History:  Diagnosis Date  . Arthritis   . CAD of autologous arterial graft 2010   Atretic LIMA; Myoview Jan 2015:  Possible mild basal anteroseptal defect thought to be artifact (CRO early LAD ischemia due to inadeuate retrograde perfusion via SVG-Diag  . CAD S/P percutaneous coronary angioplasty June 2008;    a. 6/'08: PCI nongrafted distal RCA-PDA: Mini vision BMS 2.25 mm x 28 mm; b. Relook Cath 08/2015: Stable CAD. No culprit lesion. Widely patent PDA stent. Patent SVG-OM 2, SVG-D2. Essentially atretic/small LIMA-LAD, but the LAD is perfused via SVG-D2  . Complication of anesthesia   . Coronary artery disease involving left main coronary artery 1997-2010   Last cath 2010: 70-80 % ostial left main, 80-90% LAD, subtotal occluded circumflex, RCA now a patent stent; SVG-diagonal backfills LAD, atretic LIMA-LAD -- medical therapy; stable by relook cath in March 2017  . Difficult intubation    VERY NARROW AIRWAY PER ANESTHESIOLOGIST (1997 CABG AT [facility])  . Dyslipidemia, goal LDL below 70   . Dysrhythmia   . Heart palpitations    Never fully diagnosed.  [name] History of kidney stones   . Hypertension   . Insomnia   . Mass in chest    JUST WATCHING , WILL CHECK OUT AFTER SURGERY  . S/P CABG x 3 1997   LIMA-LAD, SVG-D2, SVG-OM --> LIMA known to be atretic, but SVG-D2 fills diagonal and LAD  . Stenosis of cervical spine with myelopathy (HCC)   . Tingling    in fingers    PAST SURGICAL HISTORY   Past Surgical History:  Procedure Laterality Date  . 48-HOUR MONITOR  03/2017   Relatively normal.  Very infrequent ectopy.  Short PAT run with occasional PACs/PVCs and bigeminy.  No symptoms noted on monitor.  Longest PAT runs were less than 10 beats.  [name] BACK SURGERY     decompression  lower back   . CARDIAC CATHETERIZATION  June 2008   showed high grade distal RCA and PDA stenosis and underwent PCI with a 2.25 mm x 28 mm Mini Vision bare- metal stent.  [name] CARDIAC CATHETERIZATION  2010   Native coronaries at 70% to 80% ostial left main. LAD had diffuse 80% to 90% stenosis  . CARDIAC CATHETERIZATION N/A 08/08/2015    Procedure: Left Heart Cath and Cors/Grafts Angiography;  Surgeon: Marykay Lex, MD;  Location: [facility] INVASIVE CV LAB;  Service: Cardiovascular;  Laterality: N/A;  . COLONOSCOPY    . CORONARY ANGIOPLASTY WITH STENT PLACEMENT    . CORONARY ARTERY BYPASS GRAFT  [id number]   LIMA - LAD, SVG- diagonal, SVG-OM  . EYE SURGERY     BIL CATARACT  . KIDNEY STONE SURGERY    . KNEE ARTHROSCOPY     RT  KNEE  . NM MYOVIEW LTD  02/25/2004; Jan 2015   a) EF 57%; b)  Normal Nuclear Stress Test - No evidence of Ischemia or Infarction. Normal LV Function & wall motion.  [name] NM MYOVIEW LTD  03/17/2017   No significant reversible ischemia.  Increased TID ratio 1.34. LVEF 54% with normal wall motion. This is a low risk study.  [name] POSTERIOR CERVICAL FUSION/FORAMINOTOMY N/A 07/14/2017   Procedure: LAMINECTOMY CERVICAL THREE- CERVICAL FOUR WITH LATERAL MASS FUSION;  Surgeon: Lisbeth Renshaw, MD;  Location: [facility] OR;  Service: Neurosurgery;  Laterality: N/A;  LAMINECTOMY CERVICAL 3- CERVICAL 4 WITH LATERAL MASS FUSION    Cath 08/2015: Stable from 2010     MEDICATIONS/ALLERGIES   Current Meds  Medication Sig  . acetaminophen (TYLENOL) 500 MG tablet Take 1,000 mg by mouth every 8 (eight) hours as needed for mild pain.  . budesonide (RHINOCORT AQUA) 32 MCG/ACT nasal spray Use 1 spray in each nostril once daily  . cholecalciferol (VITAMIN D) 1000 units tablet Take 1,000 Units by mouth daily.  . clopidogrel (PLAVIX) 75 MG tablet Take 1 tablet (75 mg total) by mouth daily.  [name] docusate sodium (COLACE) 100 MG capsule Take 200-300 mg by mouth daily as needed for mild constipation or moderate constipation.  [name] Histamine Dihydrochloride (AUSTRALIAN DREAM ARTHRITIS) 0.025 % CREA Apply 1 application topically as needed (for pain).   . isosorbide mononitrate (IMDUR) 30 MG 24 hr tablet Take 30 mg by mouth daily.  [name] lisinopril (ZESTRIL) 5 MG tablet Take 1 tablet (5 mg total) by mouth daily.  . metoprolol succinate (TOPROL-XL) 50 MG  24 hr tablet Take 50 mg by mouth daily. Take 1 1/2 tablets  in the morning and take 1 tablet in evening.  . montelukast (SINGULAIR) 10 MG tablet Take 10 mg by mouth at bedtime.  . Multiple Vitamin (MULTIVITAMIN WITH MINERALS) TABS tablet Take 1 tablet by mouth daily.  . nitroGLYCERIN (NITROSTAT) 0.4 MG SL tablet PLACE 1 TABLET UNDER THE TONGUE IF NEEDED EVERY 5 MINUTES FOR CHEST PAIN FOR 3 DOSES; IF NO RELIEF AFTER FIRST DOSE CALL PRESCRIBER OR 911.  . tamsulosin (FLOMAX) 0.4 MG CAPS capsule Take 0.4 mg by mouth at bedtime.   . traMADol (ULTRAM) 50 MG tablet Take 100 mg by mouth 2 (two) times daily.   . vitamin B-12 (CYANOCOBALAMIN) 1000 MCG tablet Take 1,000 mcg by mouth 2 (two) times daily.   . [DISCONTINUED] nitroGLYCERIN (NITROSTAT) 0.4 MG SL tablet PLACE 1 TABLET UNDER THE TONGUE IF NEEDED EVERY 5 MINUTES FOR CHEST PAIN FOR 3 DOSES; IF NO RELIEF AFTER FIRST DOSE CALL PRESCRIBER OR 911.    Allergies  Allergen Reactions  . Penicillins Swelling and Other (See Comments)    Swelling and redness in joints  Has patient had a PCN reaction causing immediate rash, facial/tongue/throat swelling, SOB or lightheadedness with hypotension: No Has patient had a PCN reaction causing severe rash involving mucus membranes or skin necrosis: No Has patient had a PCN reaction that required hospitalization: No Has patient had a PCN reaction occurring within the last 10 years: No If all of the above answers are "NO", then may proceed with Cephalosporin use.   . Sulfa Antibiotics Other (See Comments)    Pain in left side underneath ribcage-pancreas  . Statins Other (See Comments)    UNSPECIFIED REACTION  "INTOLERANCE AT HIGH DOSES"    SOCIAL HISTORY/FAMILY HISTORY   Reviewed in Epic:  Pertinent findings: -Wife passed away in 06-28-22 after for a bout with cancer.  Now living alone.  OBJCTIVE -PE, EKG, labs   Wt Readings from Last 3 Encounters:  09/18/19 193 lb 6.4 oz (87.7 kg)  07/10/19 184 lb (83.5  kg)  03/22/19 188 lb 12.8 oz (85.6 kg)    Physical Exam:  BP 120/74   Pulse 65   Ht 6\' 3"  (1.905 m)   Wt 193 lb 6.4 oz (87.7 kg)   SpO2 96%   BMI 24.17 kg/m  Physical Exam  Constitutional: He is oriented to person, place, and time. He appears well-developed and well-nourished. No distress.  Well-groomed.  Very tall gentleman.  Healthy-appearing  HENT:  Head: Normocephalic and atraumatic.  Neck: No hepatojugular reflux and no JVD present. Carotid bruit is not present.  Cardiovascular: Normal rate, regular rhythm, normal heart sounds and intact distal pulses.  No extrasystoles are present. PMI is not displaced. Exam reveals no gallop and no friction rub.  No murmur heard. Pulmonary/Chest: Effort normal and breath sounds normal. No respiratory distress. He has no wheezes. He has no rales.  Abdominal: Soft. Bowel sounds are normal. He exhibits no distension. There is no abdominal tenderness.  No HSM  Musculoskeletal:        General: No edema. Normal range of motion.     Cervical back: Normal range of motion and neck supple.  Neurological: He is alert and oriented to person, place, and time.  Psychiatric: He has a normal mood and affect. His behavior is normal. Judgment and thought content normal.  Vitals reviewed.   Adult ECG Report  Rate: 65;  Rhythm: normal sinus rhythm and Normal axis, intervals and durations.;   Narrative Interpretation: Normal EKG  Recent Labs: Labs are followed by PCP.  (Should be due for lipids soon)   August 03, 2018: TC 191, TG 177, HDL 37, LDL 119. ->  Not checking frequently simply because he prefers to not take medications.  Lab Results  Component Value Date   CREATININE 0.90 08/05/2017   BUN 14 08/05/2017   NA 140 08/05/2017   K 4.4 08/05/2017   CL 104 08/05/2017   CO2 25 08/05/2017   Lab Results  Component Value Date   TSH 0.20 (L) 05/02/2017    ASSESSMENT/PLAN    Problem List Items Addressed This Visit    LM-CAD - atretic LIMA-LAD,  patent SVG-D2-> fills LAD & SVG-OM, BMS PCI rPDA (Chronic)    Distant history of CABG secondary to left main disease back in 97.  LIMA became atretic and LAD feeds via retrograde filling from the SVG-D2.  Otherwise SVG to OM was also patent along with PDA stent by last cath.  No active angina.  Plan:   Continue maintenance secondary prevention with Plavix (chosen over aspirin -less bruising)  Okay to hold Plavix 5-7 days preop for any procedures or surgeries  Continue beta-blocker and ACE inhibitor along with low-dose Imdur      Relevant Medications   metoprolol succinate (TOPROL-XL) 50 MG 24 hr tablet   nitroGLYCERIN (NITROSTAT) 0.4 MG SL tablet   Other Relevant Orders   EKG 12-Lead (Completed)   S/P CABG x 3 (Chronic)   Relevant Orders   EKG 12-Lead (Completed)   Essential hypertension (Chronic)    Stable blood pressure today on current medications.  He is taking his Toprol twice a day and seems to have palpitations controlled.  Taking a total of 1/2 tablets of the evening and 1 at night along with lisinopril.  No further orthostatic symptoms.      Relevant Medications   metoprolol succinate (TOPROL-XL) 50 MG 24 hr tablet   nitroGLYCERIN (NITROSTAT) 0.4 MG SL tablet   Dyslipidemia, goal LDL below 70 - Primary (Chronic)    No longer taking statin.  He is not interested in taking other medications  and not interested in even trying low-dose statin.  Does not want to try injections.  At this point we simply just 1 to make sure that his lipids are not going ~2 months control.  They have been followed by his PCP.  Will defer to PCP to check.      Relevant Medications   metoprolol succinate (TOPROL-XL) 50 MG 24 hr tablet   nitroGLYCERIN (NITROSTAT) 0.4 MG SL tablet   Orthostatic hypotension (Chronic)    Notably improved now.  Splitting of the dosing of his Toprol seem to have helped as did reducing ACE inhibitor. No diuretic.      Relevant Medications   metoprolol succinate  (TOPROL-XL) 50 MG 24 hr tablet   nitroGLYCERIN (NITROSTAT) 0.4 MG SL tablet   Fatigue due to treatment (Chronic)    Overall, I think his energy improved as we backed down some of his beta-blocker and ACE inhibitor.  Does better with more stable blood pressure. At this point I think fatigue may simply just be related to his age.      Heart palpitations (Chronic)    Stabilized with the extra 25 mg of Toprol in the morning.      Relevant Orders   EKG 12-Lead (Completed)   DOE (dyspnea on exertion) (Chronic)    Seems to be doing relatively well and stable from the standpoint.  Last Myoview was in 2018-nonischemic. I suspect that he may have some deconditioning and age-related exertional dyspnea.  Overall stable. No medication changes.  No further testing.      Relevant Orders   EKG 12-Lead (Completed)       COVID-19 Education: The signs and symptoms of COVID-19 were discussed with the patient and how to seek care for testing (follow up with PCP or arrange E-visit).   The importance of social distancing was discussed today.  I spent a total of 18minutes with the patient. >  50% of the time was spent in direct patient consultation.  Additional time spent with chart review  / charting (studies, outside notes, etc): 8 Total Time: 26 min   Current medicines are reviewed at length with the patient today.  (+/- concerns) n/a  Notice: This dictation was prepared with Dragon dictation along with smaller phrase technology. Any transcriptional errors that result from this process are unintentional and may not be corrected upon review.  Patient Instructions / Medication Changes & Studies & Tests Ordered   Patient Instructions  Medication Instructions:  NO CHANGES  *If you need a refill on your cardiac medications before your next appointment, please call your pharmacy*   Lab Work: PLEASE HAVE Dr Tamera StandsARONSON  SEND COPY OF CHOLESTEROL LEVELS , CMP      Testing/Procedures: NOT  NEEDED   Follow-Up: At [facility], you and your health needs are our priority.  As part of our continuing mission to provide you with exceptional heart care, we have created designated Provider Care Teams.  These Care Teams include your primary Cardiologist (physician) and Advanced Practice Providers (APPs -  Physician Assistants and Nurse Practitioners) who all work together to provide you with the care you need, when you need it.    Your next appointment:   6 month(s)  The format for your next appointment:   In Person  Provider:   Bryan Lemmaavid Yoselin Amerman, MD   Other Instructions     Studies Ordered:   Orders Placed This Encounter  Procedures  . EKG 12-Lead     Bryan Lemmaavid Avaleen Brownley, M.D., M.S.  Interventional Cardiologist   Pager # [phone] Phone # [phone] [address] [place], [place] [address]   Thank you for choosing Heartcare at [facility]!!

## 2019-09-18 NOTE — Patient Instructions (Addendum)
Medication Instructions:  NO CHANGES  *If you need a refill on your cardiac medications before your next appointment, please call your pharmacy*   Lab Work: PLEASE HAVE Dr Tamera Stands COPY OF CHOLESTEROL LEVELS , CMP      Testing/Procedures: NOT NEEDED   Follow-Up: At [facility], you and your health needs are our priority.  As part of our continuing mission to provide you with exceptional heart care, we have created designated Provider Care Teams.  These Care Teams include your primary Cardiologist (physician) and Advanced Practice Providers (APPs -  Physician Assistants and Nurse Practitioners) who all work together to provide you with the care you need, when you need it.    Your next appointment:   6 month(s)  The format for your next appointment:   In Person  Provider:   Bryan Lemma, MD   Other Instructions

## 2019-09-23 ENCOUNTER — Encounter: Payer: Self-pay | Admitting: Cardiology

## 2019-09-23 NOTE — Assessment & Plan Note (Signed)
Stabilized with the extra 25 mg of Toprol in the morning.

## 2019-09-23 NOTE — Assessment & Plan Note (Signed)
Stable blood pressure today on current medications.  He is taking his Toprol twice a day and seems to have palpitations controlled.  Taking a total of 1/2 tablets of the evening and 1 at night along with lisinopril.  No further orthostatic symptoms.

## 2019-09-23 NOTE — Assessment & Plan Note (Addendum)
Distant history of CABG secondary to left main disease back in 76.  LIMA became atretic and LAD feeds via retrograde filling from the SVG-D2.  Otherwise SVG to OM was also patent along with PDA stent by last cath.  No active angina.  Plan:   Continue maintenance secondary prevention with Plavix (chosen over aspirin -less bruising)  Okay to hold Plavix 5-7 days preop for any procedures or surgeries  Continue beta-blocker and ACE inhibitor along with low-dose Imdur

## 2019-09-23 NOTE — Assessment & Plan Note (Signed)
No longer taking statin.  He is not interested in taking other medications and not interested in even trying low-dose statin.  Does not want to try injections.  At this point we simply just 1 to make sure that his lipids are not going ~2 months control.  They have been followed by his PCP.  Will defer to PCP to check.

## 2019-09-23 NOTE — Assessment & Plan Note (Signed)
Overall, I think his energy improved as we backed down some of his beta-blocker and ACE inhibitor.  Does better with more stable blood pressure. At this point I think fatigue may simply just be related to his age.

## 2019-09-23 NOTE — Assessment & Plan Note (Signed)
Seems to be doing relatively well and stable from the standpoint.  Last Myoview was in 2018-nonischemic. I suspect that he may have some deconditioning and age-related exertional dyspnea.  Overall stable. No medication changes.  No further testing.

## 2019-09-23 NOTE — Assessment & Plan Note (Signed)
Notably improved now.  Splitting of the dosing of his Toprol seem to have helped as did reducing ACE inhibitor. No diuretic.

## 2020-02-10 ENCOUNTER — Other Ambulatory Visit: Payer: Self-pay | Admitting: Cardiology

## 2020-02-18 ENCOUNTER — Telehealth: Payer: Self-pay | Admitting: Cardiology

## 2020-02-18 NOTE — Telephone Encounter (Signed)
I do not see a preop clearance in our system at this time, please check with the patient and call the surgeon's office to obtain more information on the cardiac clearance

## 2020-02-18 NOTE — Telephone Encounter (Signed)
Patient is calling to follow up regarding a clearance request form that was faxed to the office on 02/13/20. He states the clearance is for him to have a knee replacement procedure with Dr. Sherle[facility] and it has not yet been scheduled as they are awaiting clearance from our office. Please call to discuss faxed clearance form and to obtain additional information.

## 2020-02-18 NOTE — Telephone Encounter (Signed)
Placed call to pt regarding clearance.  Pt states that he had faxed it to [place] hisself but sometimes his fax machine doesn't work. Pt stated that he would refax it in the morning.  Confirmed the fax # with the pt and will let him know if we don't receive it.

## 2020-02-19 ENCOUNTER — Telehealth: Payer: Self-pay | Admitting: *Deleted

## 2020-02-19 NOTE — Telephone Encounter (Signed)
It was received today

## 2020-02-19 NOTE — Telephone Encounter (Signed)
   [facility] Pre-operative Risk Assessment      Request for surgical clearance:  1. What type of surgery is being performed?  KNEE REPLACEMENT  2. When is this surgery scheduled? TBD  3. What type of clearance is required (medical clearance vs. Pharmacy clearance to hold med vs. Both)? MEDICAL   4. Are there any medications that need to be held prior to surgery and how long?PLAVIX  5. Practice name and name of physician performing surgery? [facility] ; DR Lara Mulch   6. What is the office phone number? [phone]   7.   What is the office fax number? [phone]  8.   Anesthesia type (None, local, MAC, general) ? Leonia Corona 02/19/2020, 4:41 PM  _________________________________________________________________   (provider comments below)

## 2020-02-19 NOTE — Telephone Encounter (Signed)
Patient called to follow up regarding faxed clearance request. He would like to ensure that we received it. He states if our office still has not received it, he is able to come by the office to drop off the paperwork. Please advise.

## 2020-02-21 NOTE — Telephone Encounter (Signed)
Left voice mail to call back to assess cardiac status.    Per Dr. Elissa Hefty note 09/18/19 "Okay to hold Plavix 5-7 days preop for any procedures or surgeries"  Wait call back prior to final clearance.

## 2020-02-21 NOTE — Telephone Encounter (Signed)
Transferred to [employer].

## 2020-02-21 NOTE — Telephone Encounter (Signed)
   Primary Cardiologist: Bryan Lemma, MD  Chart reviewed as part of pre-operative protocol coverage. Given past medical history and time since last visit, based on ACC/AHA guidelines, Daniel [name] would be at acceptable risk for the planned procedure without further cardiovascular testing.   The patient was advised that if he develops new symptoms prior to surgery to contact our office to arrange for a follow-up visit, and he verbalized understanding.  I will route this recommendation to the requesting party via Epic fax function and remove from pre-op pool.  Please call with questions.  [name] Thorpe, [place] 02/21/2020, 2:03 PM

## 2020-03-17 ENCOUNTER — Other Ambulatory Visit: Payer: Self-pay

## 2020-03-17 ENCOUNTER — Encounter: Payer: Self-pay | Admitting: Cardiology

## 2020-03-17 ENCOUNTER — Ambulatory Visit: Payer: Medicare Other | Admitting: Cardiology

## 2020-03-17 VITALS — BP 138/80 | HR 71 | Ht 75.0 in | Wt 189.4 lb

## 2020-03-17 DIAGNOSIS — I951 Orthostatic hypotension: Secondary | ICD-10-CM

## 2020-03-17 DIAGNOSIS — Z951 Presence of aortocoronary bypass graft: Secondary | ICD-10-CM | POA: Diagnosis not present

## 2020-03-17 DIAGNOSIS — Z01810 Encounter for preprocedural cardiovascular examination: Secondary | ICD-10-CM

## 2020-03-17 DIAGNOSIS — R5383 Other fatigue: Secondary | ICD-10-CM

## 2020-03-17 DIAGNOSIS — I25118 Atherosclerotic heart disease of native coronary artery with other forms of angina pectoris: Secondary | ICD-10-CM

## 2020-03-17 DIAGNOSIS — I10 Essential (primary) hypertension: Secondary | ICD-10-CM | POA: Diagnosis not present

## 2020-03-17 DIAGNOSIS — R0609 Other forms of dyspnea: Secondary | ICD-10-CM

## 2020-03-17 DIAGNOSIS — I2510 Atherosclerotic heart disease of native coronary artery without angina pectoris: Secondary | ICD-10-CM

## 2020-03-17 DIAGNOSIS — R0600 Dyspnea, unspecified: Secondary | ICD-10-CM

## 2020-03-17 DIAGNOSIS — E785 Hyperlipidemia, unspecified: Secondary | ICD-10-CM

## 2020-03-17 DIAGNOSIS — G8929 Other chronic pain: Secondary | ICD-10-CM

## 2020-03-17 DIAGNOSIS — R002 Palpitations: Secondary | ICD-10-CM

## 2020-03-17 DIAGNOSIS — M25562 Pain in left knee: Secondary | ICD-10-CM

## 2020-03-17 DIAGNOSIS — M25561 Pain in right knee: Secondary | ICD-10-CM

## 2020-03-17 MED ORDER — METOPROLOL SUCCINATE ER 50 MG PO TB24: ORAL_TABLET | ORAL | 3 refills | Status: DC

## 2020-03-17 NOTE — Patient Instructions (Signed)
Medication Instructions:  No changes *If you need a refill on your cardiac medications before your next appointment, please call your pharmacy*   Lab Work: Not needed    Testing/Procedures: Not needed  Follow-Up: At [facility], you and your health needs are our priority.  As part of our continuing mission to provide you with exceptional heart care, we have created designated Provider Care Teams.  These Care Teams include your primary Cardiologist (physician) and Advanced Practice Providers (APPs -  Physician Assistants and Nurse Practitioners) who all work together to provide you with the care you need, when you need it.   Your next appointment:   6 month(s)  The format for your next appointment:   In Person  Provider:   Bryan Lemma, MD   Other Instructions  you have clearance for  Right knee surgery if you have the left knee done prior to next appointment ( 6 months) you will not need to  Have another cardiac clearance done.

## 2020-03-17 NOTE — Progress Notes (Signed)
Primary Care Provider: Geoffry Paradise, MD Cardiologist: Bryan Lemma, MD Electrophysiologist: None  Clinic Note: Chief Complaint  Patient presents with  . Follow-up    78-month  . Coronary Artery Disease    No angina  . Pre-op Exam    Right knee arthroplasty   HPI:    Daniel [name] is a 86 y.o. male with a cardiovascular history noted below who presents today for routine 57-month follow-up as well as preop evaluation for right knee arthroplasty.Daniel [name] He is a former patient Dr. Julieanne Manson with a distant history of CAD  1997: Multivessel CAD-CABG for LM CAD -- evaluation was for exertional angina.   June 2008: PTCA of rPDA for what sounded like acute CHF Sx - dyspnea, no angina. Last Cath 08/2015 -LIMA but the LAD fills via the SVG-D2.SVG-OM & rPDA stent patent. ->Image reviewed below  He also has labile hypertension with some orthostatic hypotension. Has recently been taken off of his ACE inhibitor and we reduced his p.m. dose of beta-blocker.  Recent Dx of R RetinalVeinocclusion - recommended continued Plavix.  [employer] Beachem was last seen on October 05, 2019 -> he was doing fairly well.  He was just a bit subdued with his wife [name] 15 years in past but in January prolonged bout with cancer.  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  No real cardiac symptoms, just cardiomyopathy.  He was started to have significant issues with his knees - but the R knee was starting to get worse.  -He has started walking with a walker.  Energy level is okay, just a little bit down.  Somewhat deconditioned.  Feeling good most days.  Recent Hospitalizations: None  Reviewed  CV studies:    The following studies were reviewed today: (if available, images/films reviewed: From Epic Chart or Care Everywhere) . None:   Interval History:   [name]  presents today relatively stable overall from a  cardiac standpoint.  He still notes that he is really limited by his knee pain.  He is not able to do his yard work, and even walking now is hard.  He is therefore somewhat deconditioned and feels he gets tired out quite easily.  He is feels he is not active enough.  He is not having any angina or heart failure symptoms.  Maybe has some mild swelling but nothing significant.  When walking with his walker outside he is able to walk pretty far, and before his knee was hurting and he can easily do close to 6-8 METS with activity -notably, his activity level has declined now over the last 3 months because of increased knee pain.  He notes occasional intermittent skipped beats they are not prolonged or worrisome.  No associated symptoms.  CV Review of Symptoms (Summary): positive for - edema and Mild exertional dyspnea mostly because of deconditioning and fatigue. negative for - chest pain, irregular heartbeat, orthopnea, paroxysmal nocturnal dyspnea, rapid heart rate, shortness of breath or Syncope/near syncope, TIA/amaurosis fugax claudication  The patient does not have symptoms concerning for COVID-19 infection (fever, chills, cough, or new shortness of breath).   REVIEWED OF SYSTEMS   Review of Systems  Constitutional: Positive for malaise/fatigue (He does note that he tires out quite easily.  Is somewhat deconditioned.  Not able to exercise because of knee pain.). Negative for weight loss.  HENT: Negative for congestion and nosebleeds.   Respiratory:  Negative for cough and shortness of breath.   Gastrointestinal: Negative for blood in stool, constipation and melena.  Genitourinary: Negative for hematuria.  Musculoskeletal: Positive for joint pain (Significant Bilateral knee pain - R knee worse - bone on bone. ;  Unable to do his routine walking activities or yard work.). Negative for falls.       Mostly because of knee pain and balance issues, he has an Unsteady gate - - uses walker;    Neurological: Positive for weakness (His legs are somewhat weak because his knees hurt.). Negative for dizziness (Not dizzy, just poor balance.), focal weakness and headaches.  Endo/Heme/Allergies: Negative for environmental allergies.  Psychiatric/Behavioral: Negative for depression (He seems to be handling meeting and we are relatively well.  No major depression symptoms.  Still grieving a little bit, but stable) and memory loss. The patient is not nervous/anxious and does not have insomnia.    I have reviewed and (if needed) personally updated the patient's problem list, medications, allergies, past medical and surgical history, social and family history.   PAST MEDICAL HISTORY   Past Medical History:  Diagnosis Date  . Arthritis   . CAD of autologous arterial graft 2010   Atretic LIMA; Myoview Jan 2015: Possible mild basal anteroseptal defect thought to be artifact (CRO early LAD ischemia due to inadeuate retrograde perfusion via SVG-Diag  . CAD S/P percutaneous coronary angioplasty 11/2006   a. 6/'08: PCI nongrafted distal RCA-PDA: Mini vision BMS 2.25 mm x 28 mm; b. Relook Cath 08/2015: Stable CAD. No culprit lesion. Widely patent PDA stent. Patent SVG-OM 2, SVG-D2. Essentially atretic/small LIMA-LAD, but the LAD is perfused via SVG-D2  . Complication of anesthesia   . Coronary artery disease involving left main coronary artery [id number]   Last cath 2010: 70-80 % ostial left main, 80-90% LAD, subtotal CTO LCx-OM2, RCA patent w/ patent BMS stent (dRCA-PDA); SVG-diagonal backfills LAD, atretic LIMA-LAD -- medical therapy; stable by relook cath in March 2017  . Difficult intubation    VERY NARROW AIRWAY PER ANESTHESIOLOGIST (1997 CABG AT [facility])  . Dyslipidemia, goal LDL below 70   . Dysrhythmia   . Heart palpitations    Never fully diagnosed.  Daniel [name] History of kidney stones   . Hypertension   . Insomnia   . Mass in chest    JUST WATCHING , WILL CHECK OUT AFTER SURGERY  . S/P CABG x 3 1997    LIMA-LAD, SVG-D2, SVG-OM --> LIMA known to be atretic, but SVG-D2 fills diagonal and LAD  . Stenosis of cervical spine with myelopathy (HCC)   . Tingling    in fingers    PAST SURGICAL HISTORY   Past Surgical History:  Procedure Laterality Date  . 48-HOUR MONITOR  03/2017   Relatively normal.  Very infrequent ectopy.  Short PAT run with occasional PACs/PVCs and bigeminy.  No symptoms noted on monitor.  Longest PAT runs were less than 10 beats.  Daniel [name] BACK SURGERY     decompression  lower back   . CARDIAC CATHETERIZATION N/A 08/08/2015   Procedure: Left Heart Cath and Cors/Grafts Angiography;  Surgeon: Marykay Lex, MD;  Location: [facility] INVASIVE CV LAB;; -->  LM 80%-70%. Ost LAD 80% then prox-mid 100% (after small D1). Small LCx & OM2 patent with 100% OM2. Prox & distal RCA 20%, patent BMS in distal RCA-rPDA.  Patent/atretic LIMA-dLAD, SVG-D2 (backdills entire dLAD) & SVG-OM2.   Daniel [name] COLONOSCOPY    . CORONARY ANGIOPLASTY WITH STENT PLACEMENT  Erskine Squibb 2008  BMS PCI of distal RCA-RPDA: PCI with a 2.25 mm x 28 mm Mini Vision bare-metal stent.  . CORONARY ARTERY BYPASS GRAFT  [id number]   LIMA - LAD, SVG- diagonal, SVG-OM  . EYE SURGERY     BIL CATARACT  . KIDNEY STONE SURGERY    . KNEE ARTHROSCOPY     RT  KNEE  . LEFT HEART CATH AND CORS/GRAFTS ANGIOGRAPHY  11/2006   High-grade distal RCA and PDA stenosis --> BMS PCI.   Daniel [name] LEFT HEART CATH AND CORS/GRAFTS ANGIOGRAPHY  2010   Native coronaries at 70% to 80% ostial left main. LAD had diffuse 80% to 90% stenosis  . NM MYOVIEW LTD  02/25/2004; Jan 2015   a) EF 57%; b)  Normal Nuclear Stress Test - No evidence of Ischemia or Infarction. Normal LV Function & wall motion.  Daniel [name] NM MYOVIEW LTD  03/17/2017   No significant reversible ischemia. Increased TID ratio 1.34. LVEF 54% with normal wall motion. This is a low risk study.  Daniel [name] POSTERIOR CERVICAL FUSION/FORAMINOTOMY N/A 07/14/2017   Procedure: LAMINECTOMY CERVICAL THREE- CERVICAL FOUR WITH LATERAL MASS  FUSION;  Surgeon: Lisbeth Renshaw, MD;  Location: [facility] OR;  Service: Neurosurgery;  Laterality: N/A;  LAMINECTOMY CERVICAL 3- CERVICAL 4 WITH LATERAL MASS FUSION   Cath 08/2015: Stable from 2010     There is no immunization history on file for this patient.  MEDICATIONS/ALLERGIES   Current Meds  Medication Sig  . acetaminophen (TYLENOL) 500 MG tablet Take 1,000 mg by mouth every 8 (eight) hours as needed for mild pain.  . budesonide (RHINOCORT AQUA) 32 MCG/ACT nasal spray Use 1 spray in each nostril once daily  . cholecalciferol (VITAMIN D) 1000 units tablet Take 1,000 Units by mouth daily.  . clopidogrel (PLAVIX) 75 MG tablet Take 1 tablet (75 mg total) by mouth daily.  Daniel [name] docusate sodium (COLACE) 100 MG capsule Take 200-300 mg by mouth daily as needed for mild constipation or moderate constipation.  Daniel [name] Histamine Dihydrochloride (AUSTRALIAN DREAM ARTHRITIS) 0.025 % CREA Apply 1 application topically as needed (for pain).   . isosorbide mononitrate (IMDUR) 30 MG 24 hr tablet Take 30 mg by mouth daily.  Daniel [name] lisinopril (ZESTRIL) 5 MG tablet TAKE 1 TABLET(5 MG) BY MOUTH DAILY  . metoprolol succinate (TOPROL-XL) 50 MG 24 hr tablet Take 1and 1/2 tablets (75 mg)  in the morning and take 1 tablet ( 50 mg) in evening.  . montelukast (SINGULAIR) 10 MG tablet Take 10 mg by mouth at bedtime.  . Multiple Vitamin (MULTIVITAMIN WITH MINERALS) TABS tablet Take 1 tablet by mouth daily.  . nitroGLYCERIN (NITROSTAT) 0.4 MG SL tablet PLACE 1 TABLET UNDER THE TONGUE IF NEEDED EVERY 5 MINUTES FOR CHEST PAIN FOR 3 DOSES; IF NO RELIEF AFTER FIRST DOSE CALL PRESCRIBER OR 911.  . tamsulosin (FLOMAX) 0.4 MG CAPS capsule Take 0.4 mg by mouth at bedtime.   . traMADol (ULTRAM) 50 MG tablet Take 100 mg by mouth 2 (two) times daily.   . vitamin B-12 (CYANOCOBALAMIN) 1000 MCG tablet Take 1,000 mcg by mouth 2 (two) times daily.   . [DISCONTINUED] metoprolol succinate (TOPROL-XL) 50 MG 24 hr tablet Take 50 mg by mouth  daily. Take 1 1/2 tablets  in the morning and take 1 tablet in evening.    Allergies  Allergen Reactions  . Penicillins Swelling and Other (See Comments)    Swelling and redness in joints  Has patient had a PCN reaction causing immediate rash, facial/tongue/throat swelling, SOB or  lightheadedness with hypotension: No Has patient had a PCN reaction causing severe rash involving mucus membranes or skin necrosis: No Has patient had a PCN reaction that required hospitalization: No Has patient had a PCN reaction occurring within the last 10 years: No If all of the above answers are "NO", then may proceed with Cephalosporin use.   . Sulfa Antibiotics Other (See Comments)    Pain in left side underneath ribcage-pancreas  . Statins Other (See Comments)    UNSPECIFIED REACTION  "INTOLERANCE AT HIGH DOSES"    SOCIAL HISTORY/FAMILY HISTORY   Reviewed in Epic:  Pertinent findings: -Wife passed away in 2022/06/23 after for a bout with cancer.  Now living alone.  OBJCTIVE -PE, EKG, labs   Wt Readings from Last 3 Encounters:  03/17/20 189 lb 6.4 oz (85.9 kg)  09/18/19 193 lb 6.4 oz (87.7 kg)  07/10/19 184 lb (83.5 kg)    Physical Exam: BP 138/80   Pulse 71   Ht 6\' 3"  (1.905 m)   Wt 189 lb 6.4 oz (85.9 kg)   SpO2 93%   BMI 23.67 kg/m  Physical Exam Vitals reviewed.  Constitutional:      General: He is not in acute distress.    Appearance: Normal appearance. He is normal weight. He is not ill-appearing (Healthy-appearing.  Well-groomed.) or toxic-appearing.     Comments: Very tall elderly gentleman, who actually looks younger than stated age.  HENT:     Head: Normocephalic and atraumatic.  Neck:     Vascular: No carotid bruit, hepatojugular reflux or JVD.  Cardiovascular:     Rate and Rhythm: Normal rate and regular rhythm.  No extrasystoles are present.    Chest Wall: PMI is not displaced.     Pulses: Normal pulses and intact distal pulses.     Heart sounds: Normal heart sounds.  No murmur heard.  No friction rub. No gallop. No S4 sounds.   Pulmonary:     Effort: Pulmonary effort is normal. No respiratory distress.     Breath sounds: Normal breath sounds.  Chest:     Chest wall: No tenderness.  Musculoskeletal:        General: No swelling. Normal range of motion.     Cervical back: Normal range of motion and neck supple.  Neurological:     General: No focal deficit present.     Mental Status: He is alert and oriented to person, place, and time.  Psychiatric:        Mood and Affect: Mood normal.        Behavior: Behavior normal.        Thought Content: Thought content normal.        Judgment: Judgment normal.     Adult ECG Report N/A  Recent Labs: None available since February 2020.  Lab Results  Component Value Date   CREATININE 0.90 08/05/2017   BUN 14 08/05/2017   NA 140 08/05/2017   K 4.4 08/05/2017   CL 104 08/05/2017   CO2 25 08/05/2017   Lab Results  Component Value Date   TSH 0.20 (L) 05/02/2017    ASSESSMENT/PLAN    Problem List Items Addressed This Visit    LM-CAD - atretic LIMA-LAD, patent SVG-D2-> fills LAD & SVG-OM, BMS PCI rPDA (Chronic)    Very distant history of CABG.  Not quite as distant but still distant history of BMS stent to the PDA.  Most recent ischemic evaluation was with a Myoview in 2018 after his cath in  2017.  Low risk findings.  Unfortunately, his abdomen has become atretic however the entire LAD fills via the vein graft to the diagonal which is been persistent since 2008. No active anginal symptoms.  Unfortunately, he is not as active now as he had been but was able to do at least 6-7 METS of activity before his knee started hurting too bad.  Plan: Continue current meds medications with beta-blocker and ACE inhibitor along with low-dose Imdur.  I think we potentially could stop the Imdur, will continue and there was upcoming surgery.  He is on Plavix over aspirin for long-term maintenance prophylaxis.  Had less  bruising with Plavix then aspirin.  Okay to hold Plavix 5 days preop for any surgeries or procedures. --Most notably for upcoming knee surgery  If possible, would like to see him take 81 mg aspirin while off of Plavix, but will defer to surgeon.      Relevant Medications   metoprolol succinate (TOPROL-XL) 50 MG 24 hr tablet   S/P CABG x 3 (Chronic)    Unfortunately, he now only has 2 vein grafts with a atretic LIMA.  Thankfully the vein graft to the diagonal retrograde fills the LAD.  We came to the agreement during his last ischemic evaluation the unless you are having symptoms, he would not want any further testing. As such, I do not think we need to do any further surveillance testing and I would not recommend stress test prior to surgery.  This would only simply serve to delay a much-needed surgery that he is delayed for years.      Essential hypertension (Chronic)    Stable, well-controlled blood pressure.  Upper limit of normal right now but in light of him having some balance issues, would not be overly aggressive.  He is on stable dose of lisinopril and Toprol.  No medication changes.      Relevant Medications   metoprolol succinate (TOPROL-XL) 50 MG 24 hr tablet   Dyslipidemia, goal LDL below 70 (Chronic)    He is no longer taking statin, and is really not actually trying new medications.  He has not been interested in doing injections for PCSK9 inhibitors.    He may be agreeable to try Nexletol, but wants to wait till after his knee surgery is done.  Since we are not treating his lipids, we have not been checking lipid panel.  He understands the risks involved, but at his advanced age, he has agreed to accept this risk.      Relevant Medications   metoprolol succinate (TOPROL-XL) 50 MG 24 hr tablet   Orthostatic hypotension (Chronic)    He seems to be doing much better since we backed off on the medications.  He is very tall which makes him more susceptible to being  orthostatic.  Would not push blood pressure medications any further.      Relevant Medications   metoprolol succinate (TOPROL-XL) 50 MG 24 hr tablet   Fatigue due to treatment (Chronic)    He still little bit fatigued.  Better with less medication allowing for mild permissive hypertension.  Hopefully, as his knee surgeries occur (is hoping to have staged left knee once he recovers from the right knee), he will will start getting back in exercise and improve his deconditioning.      Heart palpitations (Chronic)    Quite stable on current dose of Toprol.      DOE (dyspnea on exertion) (Chronic)    I suspect the  majority of the reason for him being dyspneic is because he has knee pain but is walking.  He otherwise has not had exertional dyspnea that has been concerning for any particular ischemic evaluation.      Preoperative cardiovascular examination - Primary    Daniel [name] needs the surgery.  I did preop evaluation for him last January but was relatively benign.  His Prozac knee surgery at that time and never happened because of his wife's illness.  Remains stable overall from my standpoint.  Would likely need to hold clopidogrel 5 to 7 days preop for surgeries or procedures.  This is okay acceptable.  (If possible would like to replace with 81 mg aspirin while off of)  PREOPERATIVE CARDIAC RISK ASSESSMENT   Revised Cardiac Risk Index:  High Risk Surgery: no; low risk Knee Arthroplasty (minimal Cardiac risk).,   Defined as Intraperitoneal, intrathoracic or suprainguinal vascular  Active CAD: no; no active anginal symptoms.  Has stable chronic catheterization 3 years ago.  CHF: no; no PND, orthopnea or edema.  Cerebrovascular Disease: no; no prior stroke or TIA  Diabetes: no; On Insulin: No  CKD (Cr >~ 2): no; last documented creatinine was 1.0.  Total: 0 Estimated Risk of Adverse Outcome: LOW RISK PATIENT for LOW RISK PROCEDURE.  Estimated Risk of MI, PE, VF/VT (Cardiac  Arrest), Complete Heart Block: 1-3.9%   ACC/AHA Guidelines for "Clearance":  Step 1 - Need for Emergency Surgery: No: Elective  If Yes - go straight to OR with perioperative surveillance  Step 2 - Active Cardiac Conditions (Unstable Angina, Decompensated HF, Significant  Arrhytmias - Complete HB, Mobitz II, Symptomatic VT or SVT, Severe Aortic Stenosis - mean gradient > 40 mmHg, Valve area < 1.0 cm2):   No: No active angina or CHF  If Yes - Evaluate & Treat per ACC/AHA Guidelines  Step 3 -  Low Risk Surgery: Yes  If Yes --> proceed to OR  If No --> Step 4  Step 4 - Functional Capacity >= 4 METS without symptoms: Yes  If Yes --> proceed to OR  If No --> Step 5  Step 5 --  Clinical Risk Factors (CRF) -> although he has known CAD, he does not have active risk factors besides his having had CABG and BMS stenting over 15 years ago.  Recommendation: Proceed to the OR without further cardiac testing. ->  Provide if this initial surgery goes well, he would be fine to continue with his second surgery on the left knee if this occurs within 6 months from now.  He would not require additional cardiac evaluation.  Based on low risk study, and no active conditions, he should be considered a low risk patient for low risk surgery.      Chronic pain of both knees    He has right knee arthroplasty scheduled, and is hoping to go straight to left knee once he recovers from the right.          COVID-19 Education: The signs and symptoms of COVID-19 were discussed with the patient and how to seek care for testing (follow up with PCP or arrange E-visit).   The importance of social distancing and COVID-19 vaccination was discussed today.  The patient is practicing social distancing & Masking.   I spent a total of with the patient spent in direct patient consultation.  Additional time spent with chart review  / charting (studies, outside notes, etc): 12 Total Time:  Current  medicines are reviewed  at length with the patient today.  (+/- concerns) n/a  This visit occurred during the SARS-CoV-2 public health emergency.  Safety protocols were in place, including screening questions prior to the visit, additional usage of staff PPE, and extensive cleaning of exam room while observing appropriate contact time as indicated for disinfecting solutions.  Notice: This dictation was prepared with Dragon dictation along with smaller phrase technology. Any transcriptional errors that result from this process are unintentional and may not be corrected upon review.  Patient Instructions / Medication Changes & Studies & Tests Ordered   Patient Instructions  Medication Instructions:  No changes *If you need a refill on your cardiac medications before your next appointment, please call your pharmacy*   Lab Work: Not needed    Testing/Procedures: Not needed  Follow-Up: At [facility], you and your health needs are our priority.  As part of our continuing mission to provide you with exceptional heart care, we have created designated Provider Care Teams.  These Care Teams include your primary Cardiologist (physician) and Advanced Practice Providers (APPs -  Physician Assistants and Nurse Practitioners) who all work together to provide you with the care you need, when you need it.   Your next appointment:   6 month(s)  The format for your next appointment:   In Person  Provider:   Bryan Lemmaavid Shawntelle Ungar, MD   Other Instructions  you have clearance for  Right knee surgery if you have the left knee done prior to next appointment ( 6 months) you will not need to  Have another cardiac clearance done.   Studies Ordered:   No orders of the defined types were placed in this encounter.    Bryan Lemmaavid Chanah Tidmore, M.D., M.S. Interventional Cardiologist   Pager # [phone] Phone # [phone] [address] [place], [place] [address]   Thank you for choosing  [facility] at [facility]!!

## 2020-03-19 DIAGNOSIS — G8929 Other chronic pain: Secondary | ICD-10-CM | POA: Insufficient documentation

## 2020-03-22 ENCOUNTER — Encounter: Payer: Self-pay | Admitting: Cardiology

## 2020-03-22 NOTE — Assessment & Plan Note (Signed)
He has right knee arthroplasty scheduled, and is hoping to go straight to left knee once he recovers from the right.

## 2020-03-22 NOTE — Assessment & Plan Note (Signed)
Stable, well-controlled blood pressure.  Upper limit of normal right now but in light of him having some balance issues, would not be overly aggressive.  He is on stable dose of lisinopril and Toprol.  No medication changes.

## 2020-03-22 NOTE — Assessment & Plan Note (Addendum)
Very distant history of CABG.  Not quite as distant but still distant history of BMS stent to the PDA.  Most recent ischemic evaluation was with a Myoview in 2018 after his cath in 2017.  Low risk findings.  Unfortunately, his abdomen has become atretic however the entire LAD fills via the vein graft to the diagonal which is been persistent since 2008. No active anginal symptoms.  Unfortunately, he is not as active now as he had been but was able to do at least 6-7 METS of activity before his knee started hurting too bad.  Plan: Continue current meds medications with beta-blocker and ACE inhibitor along with low-dose Imdur.  I think we potentially could stop the Imdur, will continue and there was upcoming surgery.  He is on Plavix over aspirin for long-term maintenance prophylaxis.  Had less bruising with Plavix then aspirin.  Okay to hold Plavix 5 days preop for any surgeries or procedures. --Most notably for upcoming knee surgery  If possible, would like to see him take 81 mg aspirin while off of Plavix, but will defer to surgeon.

## 2020-03-22 NOTE — Assessment & Plan Note (Signed)
Quite stable on current dose of Toprol.

## 2020-03-22 NOTE — Assessment & Plan Note (Signed)
Unfortunately, he now only has 2 vein grafts with a atretic LIMA.  Thankfully the vein graft to the diagonal retrograde fills the LAD.  We came to the agreement during his last ischemic evaluation the unless you are having symptoms, he would not want any further testing. As such, I do not think we need to do any further surveillance testing and I would not recommend stress test prior to surgery.  This would only simply serve to delay a much-needed surgery that he is delayed for years.

## 2020-03-22 NOTE — Assessment & Plan Note (Signed)
I suspect the majority of the reason for him being dyspneic is because he has knee pain but is walking.  He otherwise has not had exertional dyspnea that has been concerning for any particular ischemic evaluation.

## 2020-03-22 NOTE — Assessment & Plan Note (Signed)
He seems to be doing much better since we backed off on the medications.  He is very tall which makes him more susceptible to being orthostatic.  Would not push blood pressure medications any further.

## 2020-03-22 NOTE — Assessment & Plan Note (Signed)
[name] needs the surgery.  I did preop evaluation for him last January but was relatively benign.  His Prozac knee surgery at that time and never happened because of his wife's illness.  Remains stable overall from my standpoint.  Would likely need to hold clopidogrel 5 to 7 days preop for surgeries or procedures.  This is okay acceptable.  (If possible would like to replace with 81 mg aspirin while off of)  PREOPERATIVE CARDIAC RISK ASSESSMENT   Revised Cardiac Risk Index:  High Risk Surgery: no; low risk Knee Arthroplasty (minimal Cardiac risk).,   Defined as Intraperitoneal, intrathoracic or suprainguinal vascular  Active CAD: no; no active anginal symptoms.  Has stable chronic catheterization 3 years ago.  CHF: no; no PND, orthopnea or edema.  Cerebrovascular Disease: no; no prior stroke or TIA  Diabetes: no; On Insulin: No  CKD (Cr >~ 2): no; last documented creatinine was 1.0.  Total: 0 Estimated Risk of Adverse Outcome: LOW RISK PATIENT for LOW RISK PROCEDURE.  Estimated Risk of MI, PE, VF/VT (Cardiac Arrest), Complete Heart Block: 1-3.9%   ACC/AHA Guidelines for "Clearance":  Step 1 - Need for Emergency Surgery: No: Elective  If Yes - go straight to OR with perioperative surveillance  Step 2 - Active Cardiac Conditions (Unstable Angina, Decompensated HF, Significant  Arrhytmias - Complete HB, Mobitz II, Symptomatic VT or SVT, Severe Aortic Stenosis - mean gradient > 40 mmHg, Valve area < 1.0 cm2):   No: No active angina or CHF  If Yes - Evaluate & Treat per ACC/AHA Guidelines  Step 3 -  Low Risk Surgery: Yes  If Yes --> proceed to OR  If No --> Step 4  Step 4 - Functional Capacity >= 4 METS without symptoms: Yes  If Yes --> proceed to OR  If No --> Step 5  Step 5 --  Clinical Risk Factors (CRF) -> although he has known CAD, he does not have active risk factors besides his having had CABG and BMS stenting over 15 years ago.  Recommendation: Proceed to  the OR without further cardiac testing. ->  Provide if this initial surgery goes well, he would be fine to continue with his second surgery on the left knee if this occurs within 6 months from now.  He would not require additional cardiac evaluation.  Based on low risk study, and no active conditions, he should be considered a low risk patient for low risk surgery.

## 2020-03-22 NOTE — Assessment & Plan Note (Signed)
He is no longer taking statin, and is really not actually trying new medications.  He has not been interested in doing injections for PCSK9 inhibitors.    He may be agreeable to try Nexletol, but wants to wait till after his knee surgery is done.  Since we are not treating his lipids, we have not been checking lipid panel.  He understands the risks involved, but at his advanced age, he has agreed to accept this risk.

## 2020-03-22 NOTE — Assessment & Plan Note (Signed)
He still little bit fatigued.  Better with less medication allowing for mild permissive hypertension.  Hopefully, as his knee surgeries occur (is hoping to have staged left knee once he recovers from the right knee), he will will start getting back in exercise and improve his deconditioning.

## 2020-04-02 NOTE — Progress Notes (Signed)
Pt. Needs orders for upcoming surgery. PST and lab appointment on: 04/03/20.

## 2020-04-02 NOTE — Patient Instructions (Addendum)
DUE TO COVID-19 ONLY ONE VISITOR IS ALLOWED TO COME WITH YOU AND STAY IN THE WAITING ROOM ONLY DURING PRE OP AND PROCEDURE DAY OF SURGERY. THE 1 VISITOR  MAY VISIT WITH YOU AFTER SURGERY IN YOUR PRIVATE ROOM DURING VISITING HOURS ONLY!  YOU NEED TO HAVE A COVID 19 TEST ON: 04/04/20 @ 2:00 PM, THIS TEST MUST BE DONE BEFORE SURGERY,  COVID TESTING SITE [address] [place] [address], IT IS ON THE RIGHT GOING OUT [address] APPROXIMATELY  2 MINUTES PAST [employer] ON THE RIGHT. ONCE YOUR COVID TEST IS COMPLETED,  PLEASE BEGIN THE QUARANTINE INSTRUCTIONS AS OUTLINED IN YOUR HANDOUT.                Daniel [name]    Your procedure is scheduled on: 04/07/20   Report to [facility] Main  Entrance   Report to short stay at: 5:30 AM     Call this number if you have problems the morning of surgery [phone]    Remember:  NO SOLID FOOD AFTER MIDNIGHT THE NIGHT PRIOR TO SURGERY. NOTHING BY MOUTH EXCEPT CLEAR LIQUIDS UNTIL: 4:30 AM . PLEASE FINISH ENSURE DRINK PER SURGEON ORDER  WHICH NEEDS TO BE COMPLETED AT: 4:30 AM .  CLEAR LIQUID DIET   Foods Allowed                                                                     Foods Excluded  Coffee and tea, regular and decaf                             liquids that you cannot  Plain Jell-O any favor except red or purple                                           see through such as: Fruit ices (not with fruit pulp)                                     milk, soups, orange juice  Iced Popsicles                                    All solid food Carbonated beverages, regular and diet                                    Cranberry, grape and apple juices Sports drinks like Gatorade Lightly seasoned clear broth or consume(fat free) Sugar, honey syrup  Sample Menu Breakfast                                Lunch  Supper Cranberry juice                    Beef broth                             Chicken broth Jell-O                                     Grape juice                           Apple juice Coffee or tea                        Jell-O                                      Popsicle                                                Coffee or tea                        Coffee or tea  _____________________________________________________________________  BRUSH YOUR TEETH MORNING OF SURGERY AND RINSE YOUR MOUTH OUT, NO CHEWING GUM CANDY OR MINTS.     Take these medicines the morning of surgery with A SIP OF WATER: metoprolol,isosorbide.                               You may not have any metal on your body including hair pins and              piercings  Do not wear jewelry, lotions, powders or perfumes, deodorant             Men may shave face and neck.   Do not bring valuables to the hospital. [facility] [facility] IS NOT             RESPONSIBLE   FOR VALUABLES.  Contacts, dentures or bridgework may not be worn into surgery.  Leave suitcase in the car. After surgery it may be brought to your room.     Patients discharged the day of surgery will not be allowed to drive home. IF YOU ARE HAVING SURGERY AND GOING HOME THE SAME DAY, YOU MUST HAVE AN ADULT TO DRIVE YOU HOME AND BE WITH YOU FOR 24 HOURS. YOU MAY GO HOME BY TAXI OR UBER OR ORTHERWISE, BUT AN ADULT MUST ACCOMPANY YOU HOME AND STAY WITH YOU FOR 24 HOURS.  Name and phone number of your driver:  Special Instructions: N/A              Please read over the following fact sheets you were given: ____________________________________________________________________          [facility] - Preparing for Surgery Before surgery, you can play an important role.  Because skin is not sterile, your skin needs to be as free of germs as possible.  You can reduce the number of germs on your skin by washing with CHG (chlorahexidine  gluconate) soap before surgery.  CHG is an antiseptic cleaner which kills germs and bonds with the skin to continue  killing germs even after washing. Please DO NOT use if you have an allergy to CHG or antibacterial soaps.  If your skin becomes reddened/irritated stop using the CHG and inform your nurse when you arrive at Short Stay. Do not shave (including legs and underarms) for at least 48 hours prior to the first CHG shower.  You may shave your face/neck. Please follow these instructions carefully:  1.  Shower with CHG Soap the night before surgery and the  morning of Surgery.  2.  If you choose to wash your hair, wash your hair first as usual with your  normal  shampoo.  3.  After you shampoo, rinse your hair and body thoroughly to remove the  shampoo.                           4.  Use CHG as you would any other liquid soap.  You can apply chg directly  to the skin and wash                       Gently with a scrungie or clean washcloth.  5.  Apply the CHG Soap to your body ONLY FROM THE NECK DOWN.   Do not use on face/ open                           Wound or open sores. Avoid contact with eyes, ears mouth and genitals (private parts).                       Wash face,  Genitals (private parts) with your normal soap.             6.  Wash thoroughly, paying special attention to the area where your surgery  will be performed.  7.  Thoroughly rinse your body with warm water from the neck down.  8.  DO NOT shower/wash with your normal soap after using and rinsing off  the CHG Soap.                9.  Pat yourself dry with a clean towel.            10.  Wear clean pajamas.            11.  Place clean sheets on your bed the night of your first shower and do not  sleep with pets. Day of Surgery : Do not apply any lotions/deodorants the morning of surgery.  Please wear clean clothes to the hospital/surgery center.  FAILURE TO FOLLOW THESE INSTRUCTIONS MAY RESULT IN THE CANCELLATION OF YOUR SURGERY PATIENT SIGNATURE_________________________________  NURSE  SIGNATURE__________________________________  ________________________________________________________________________   [name]  An incentive spirometer is a tool that can help keep your lungs clear and active. This tool measures how well you are filling your lungs with each breath. Taking long deep breaths may help reverse or decrease the chance of developing breathing (pulmonary) problems (especially infection) following:  A long period of time when you are unable to move or be active. BEFORE THE PROCEDURE   If the spirometer includes an indicator to show your best effort, your nurse or respiratory therapist will set it to a desired goal.  If possible, sit up straight or lean slightly  forward. Try not to slouch.  Hold the incentive spirometer in an upright position. INSTRUCTIONS FOR USE  1. Sit on the edge of your bed if possible, or sit up as far as you can in bed or on a chair. 2. Hold the incentive spirometer in an upright position. 3. Breathe out normally. 4. Place the mouthpiece in your mouth and seal your lips tightly around it. 5. Breathe in slowly and as deeply as possible, raising the piston or the ball toward the top of the column. 6. Hold your breath for 3-5 seconds or for as long as possible. Allow the piston or ball to fall to the bottom of the column. 7. Remove the mouthpiece from your mouth and breathe out normally. 8. Rest for a few seconds and repeat Steps 1 through 7 at least 10 times every 1-2 hours when you are awake. Take your time and take a few normal breaths between deep breaths. 9. The spirometer may include an indicator to show your best effort. Use the indicator as a goal to work toward during each repetition. 10. After each set of 10 deep breaths, practice coughing to be sure your lungs are clear. If you have an incision (the cut made at the time of surgery), support your incision when coughing by placing a pillow or rolled up towels firmly  against it. Once you are able to get out of bed, walk around indoors and cough well. You may stop using the incentive spirometer when instructed by your caregiver.  RISKS AND COMPLICATIONS  Take your time so you do not get dizzy or light-headed.  If you are in pain, you may need to take or ask for pain medication before doing incentive spirometry. It is harder to take a deep breath if you are having pain. AFTER USE  Rest and breathe slowly and easily.  It can be helpful to keep track of a log of your progress. Your caregiver can provide you with a simple table to help with this. If you are using the spirometer at home, follow these instructions: [place] IF:   You are having difficultly using the spirometer.  You have trouble using the spirometer as often as instructed.  Your pain medication is not giving enough relief while using the spirometer.  You develop fever of 100.5 F (38.1 C) or higher. SEEK IMMEDIATE MEDICAL CARE IF:   You cough up bloody sputum that had not been present before.  You develop fever of 102 F (38.9 C) or greater.  You develop worsening pain at or near the incision site. MAKE SURE YOU:   Understand these instructions.  Will watch your condition.  Will get help right away if you are not doing well or get worse. Document Released: 10/04/2006 Document Revised: 08/16/2011 Document Reviewed: 12/05/2006 [facility] Patient Information 2014 [place], [place].   ________________________________________________________________________

## 2020-04-03 ENCOUNTER — Encounter (HOSPITAL_COMMUNITY)
Admission: RE | Admit: 2020-04-03 | Discharge: 2020-04-03 | Disposition: A | Discharge status: Home / Self Care | Payer: Medicare Other | Source: Ambulatory Visit | Attending: Orthopedic Surgery | Admitting: Orthopedic Surgery

## 2020-04-03 ENCOUNTER — Other Ambulatory Visit: Payer: Self-pay | Admitting: Orthopedic Surgery

## 2020-04-03 ENCOUNTER — Other Ambulatory Visit: Payer: Self-pay

## 2020-04-03 ENCOUNTER — Encounter (HOSPITAL_COMMUNITY): Payer: Self-pay

## 2020-04-03 DIAGNOSIS — M1711 Unilateral primary osteoarthritis, right knee: Secondary | ICD-10-CM | POA: Diagnosis not present

## 2020-04-03 DIAGNOSIS — Z79899 Other long term (current) drug therapy: Secondary | ICD-10-CM | POA: Insufficient documentation

## 2020-04-03 DIAGNOSIS — Z01812 Encounter for preprocedural laboratory examination: Secondary | ICD-10-CM | POA: Diagnosis present

## 2020-04-03 DIAGNOSIS — Z7901 Long term (current) use of anticoagulants: Secondary | ICD-10-CM | POA: Diagnosis not present

## 2020-04-03 DIAGNOSIS — I10 Essential (primary) hypertension: Secondary | ICD-10-CM | POA: Diagnosis not present

## 2020-04-03 DIAGNOSIS — I2510 Atherosclerotic heart disease of native coronary artery without angina pectoris: Secondary | ICD-10-CM | POA: Insufficient documentation

## 2020-04-03 HISTORY — DX: Pneumonia, unspecified organism: J18.9

## 2020-04-03 LAB — COMPREHENSIVE METABOLIC PANEL
ALT: 14 U/L (ref 0–44)
AST: 16 U/L (ref 15–41)
Albumin: 3.7 g/dL (ref 3.5–5.0)
Alkaline Phosphatase: 77 U/L (ref 38–126)
Anion gap: 11 (ref 5–15)
BUN: 25 mg/dL — ABNORMAL HIGH (ref 8–23)
CO2: 28 mmol/L (ref 22–32)
Calcium: 9.9 mg/dL (ref 8.9–10.3)
Chloride: 105 mmol/L (ref 98–111)
Creatinine, Ser: 0.92 mg/dL (ref 0.61–1.24)
GFR, Estimated: >60 mL/min (ref >60)
Glucose, Bld: 115 mg/dL — ABNORMAL HIGH (ref 70–99)
Potassium: 4.5 mmol/L (ref 3.5–5.1)
Sodium: 144 mmol/L (ref 135–145)
Total Bilirubin: 0.8 mg/dL (ref 0.3–1.2)
Total Protein: 7.7 g/dL (ref 6.5–8.1)

## 2020-04-03 LAB — CBC WITH DIFFERENTIAL/PLATELET
Abs Immature Granulocytes: 0.02 10*3/uL (ref 0.00–0.07)
Basophils Absolute: 0 10*3/uL (ref 0.0–0.1)
Basophils Relative: 0 %
Eosinophils Absolute: 0.1 10*3/uL (ref 0.0–0.5)
Eosinophils Relative: 1 %
HCT: 43.1 % (ref 39.0–52.0)
Hemoglobin: 14.3 g/dL (ref 13.0–17.0)
Immature Granulocytes: 0 %
Lymphocytes Relative: 26 %
Lymphs Abs: 2.1 10*3/uL (ref 0.7–4.0)
MCH: 30.5 pg (ref 26.0–34.0)
MCHC: 33.2 g/dL (ref 30.0–36.0)
MCV: 91.9 fL (ref 80.0–100.0)
Monocytes Absolute: 0.7 10*3/uL (ref 0.1–1.0)
Monocytes Relative: 10 %
Neutro Abs: 4.9 10*3/uL (ref 1.7–7.7)
Neutrophils Relative %: 63 %
Platelets: 254 10*3/uL (ref 150–400)
RBC: 4.69 MIL/uL (ref 4.22–5.81)
RDW: 13.7 % (ref 11.5–15.5)
WBC: 7.8 10*3/uL (ref 4.0–10.5)
nRBC: 0 % (ref 0.0–0.2)

## 2020-04-03 LAB — SURGICAL PCR SCREEN
MRSA, PCR: NEGATIVE
Staphylococcus aureus: NEGATIVE

## 2020-04-03 NOTE — Progress Notes (Signed)
COVID Vaccine Completed: Yes Date COVID Vaccine completed: 03/04/20 COVID vaccine manufacturer: Pfizer      PCP - Dr. Geoffry Paradise Cardiologist - Dr. Merton Border. LOV: 03/17/20.:CLearance. EPIC  Chest x-ray -  EKG - 09/18/19 EPIC Stress Test -  ECHO -  Cardiac Cath -  Pacemaker/ICD device last checked:  Sleep Study -  CPAP -   Fasting Blood Sugar -  Checks Blood Sugar _____ times a day  Blood Thinner Instructions: Plavix can be hold fro five days before surgery as per cardiologist note. Aspirin Instructions: Last Dose:  Anesthesia review: Hx: HTN,CABG x 3, CAD,Dysrhythmia.  Patient denies shortness of breath, fever, cough and chest pain at PAT appointment   Patient verbalized understanding of instructions that were given to them at the PAT appointment. Patient was also instructed that they will need to review over the PAT instructions again at home before surgery.

## 2020-04-03 NOTE — Anesthesia Preprocedure Evaluation (Addendum)
Anesthesia Evaluation  Patient identified by MRN, date of birth, ID band Patient awake    Reviewed: Allergy & Precautions, NPO status , Patient's Chart, lab work & pertinent test results, reviewed documented beta blocker date and time   History of Anesthesia Complications (+) DIFFICULT AIRWAY  Airway Mallampati: IV  TM Distance: <3 FB Neck ROM: Full  Mouth opening: Limited Mouth Opening Comment: Small mouth Dental  (+) Dental Advisory Given, Chipped LEFT LATERAL-- VERY POOR NECK MOBILITY AND LIMITED MOUTH OPENING:   Pulmonary COPD,  COPD inhaler,  04/04/2020 SARS coronavirus NEG   breath sounds clear to auscultation       Cardiovascular hypertension, Pt. on medications and Pt. on home beta blockers (-) angina+ CAD, + Cardiac Stents and + CABG   Rhythm:Regular Rate:Normal  '18 Stress: EF 54%, no significant reversible ischemia   Neuro/Psych negative neurological ROS     GI/Hepatic negative GI ROS, Neg liver ROS,   Endo/Other  negative endocrine ROS  Renal/GU negative Renal ROS     Musculoskeletal   Abdominal   Peds  Hematology plavix   Anesthesia Other Findings   Reproductive/Obstetrics                          Anesthesia Physical Anesthesia Plan  ASA: III  Anesthesia Plan: Spinal   Post-op Pain Management:  Regional for Post-op pain   Induction:   PONV Risk Score and Plan: 1 and Ondansetron and Treatment may vary due to age or medical condition  Airway Management Planned: Natural Airway and Simple Face Mask  Additional Equipment: None  Intra-op Plan:   Post-operative Plan:   Informed Consent: I have reviewed the patients History and Physical, chart, labs and discussed the procedure including the risks, benefits and alternatives for the proposed anesthesia with the patient or authorized representative who has indicated his/her understanding and acceptance.     Dental advisory  given  Plan Discussed with: CRNA and Surgeon  Anesthesia Plan Comments: (See APP note by Joslyn Hy, FNP  Plan routine monitors, SAB with adductor canal block for post op analgesia)      Anesthesia Quick Evaluation

## 2020-04-03 NOTE — Progress Notes (Signed)
Anesthesia Chart Review:   Case: [id number] Date/Time: 04/07/20 0700   Procedure: TOTAL KNEE ARTHROPLASTY (Right Knee)   Anesthesia type: Spinal   Pre-op diagnosis: Osteoarthritis of right knee M17.11   Location: [facility] ROOM 07 / [facility]   Surgeons: Dannielle Huh, MD      DISCUSSION:  Pt is an 86 year old with hx CAD (s/p CABG 1997; s/p stent to PDA 2008; 2017 cath showed atreic LIMA to LAD but vein graft to diagonal retrograde fills LAD), HTN  - Pt to hold plavix 5 days before surgery   VS: BP (!) 142/76   Pulse 81   Temp 37.1 C (Oral)   Resp 18   Ht 6\' 3"  (1.905 m)   Wt 85 kg   SpO2 100%   BMI 23.41 kg/m    PROVIDERS: - PCP is , MD - Cardiologist is Geoffry Paradise, MD. Cleared pt for surgery at acceptable risk at last office visit 03/17/20.    LABS: Labs reviewed: Acceptable for surgery. (all labs ordered are listed, but only abnormal results are displayed)  Labs Reviewed  COMPREHENSIVE METABOLIC PANEL - Abnormal; Notable for the following components:      Result Value   Glucose, Bld 115 (*)    BUN 25 (*)    All other components within normal limits  SURGICAL PCR SCREEN  CBC WITH DIFFERENTIAL/PLATELET  CBC     IMAGES: CT angio chest PE 08/05/17:  1.  Negative for acute pulmonary embolus. 2. Chronic elevation of the right hemidiaphragm suggesting chronic right phrenic nerve palsy. Mild atelectasis but no other acute pulmonary finding.   EKG 09/18/19: NSR   CV: Holter monitor 03/23/17:   Mostly sinus rhythm/sinus bradycardia sinus arrhythmia. First-degree AV block noted.  Rare PVCs and PACs noted.  PVCs with couplets and ventricular bigeminy noted.  Rare PACs with 2 pairs and 2 short PAT runs (longest appears to be 5 beats)  No symptoms noted on diary. - Relatively normal 48-hour monitor.  Very infrequent ectopy.  The patient may have symptoms from bigeminy or short PAT runs.   Nuclear stress test 03/17/17:   The left ventricular  ejection fraction is mildly decreased (45-54%).  Nuclear stress EF: 54%.  No T wave inversion was noted during stress.  There was no ST segment deviation noted during stress.  TID 1.34  - No significant reversible ischemia. Increased TID ratio 1.34. LVEF 54% with normal wall motion. This is a low risk study.   Cardiac cath 08/08/15:   Ost LM to LM lesion, 80% stenosed. LM-Ostial LAD lesion, 70-80% stenosed. Prox LAD to Mid LAD lesion, 100% stenosed after small D1.  Ost 2nd Mrg lesion, 100% stenosed. Very small caliber AV Groove Circumflex remains after OM1.  Prox RCA lesion, 20% stenosed. Dist RCA lesion, 20% stenosed. Widely patent PDA stent  SVG-OM2 was injected is large, and is anatomically normal.  SVG-D2 was injected is large, and is anatomically normal. Antegrade flow fills a moderate caliber diagonal vessel with minimal disease. Retrograde flow fills the native LAD with TIMI 3 flow.  LIMA-LAD was injected is small and diffusely Atretic. It almost does not reach the LAD, and is noted to have significant competetive flow from the Retrograde SVG-Diag flow.  The left ventricular systolic function is normal.  Systemic Hypertension with Mildly elevated LVEDP     Past Medical History:  Diagnosis Date  . Arthritis   . CAD of autologous arterial graft 2010   Atretic LIMA; Myoview Jan 2015:  Possible mild basal anteroseptal defect thought to be artifact (CRO early LAD ischemia due to inadeuate retrograde perfusion via SVG-Diag  . CAD S/P percutaneous coronary angioplasty 11/2006   a. 6/'08: PCI nongrafted distal RCA-PDA: Mini vision BMS 2.25 mm x 28 mm; b. Relook Cath 08/2015: Stable CAD. No culprit lesion. Widely patent PDA stent. Patent SVG-OM 2, SVG-D2. Essentially atretic/small LIMA-LAD, but the LAD is perfused via SVG-D2  . Complication of anesthesia   . Coronary artery disease involving left main coronary artery [id number]   Last cath 2010: 70-80 % ostial left main, 80-90%  LAD, subtotal CTO LCx-OM2, RCA patent w/ patent BMS stent (dRCA-PDA); SVG-diagonal backfills LAD, atretic LIMA-LAD -- medical therapy; stable by relook cath in March 2017  . Difficult intubation    VERY NARROW AIRWAY PER ANESTHESIOLOGIST (1997 CABG AT [facility])  . Dyslipidemia, goal LDL below 70   . Dysrhythmia   . Heart palpitations    Never fully diagnosed.  [name] History of kidney stones   . Hypertension   . Insomnia   . Mass in chest    JUST WATCHING , WILL CHECK OUT AFTER SURGERY  . Pneumonia   . S/P CABG x 3 1997   LIMA-LAD, SVG-D2, SVG-OM --> LIMA known to be atretic, but SVG-D2 fills diagonal and LAD  . Stenosis of cervical spine with myelopathy (HCC)   . Tingling    in fingers    Past Surgical History:  Procedure Laterality Date  . 48-HOUR MONITOR  03/2017   Relatively normal.  Very infrequent ectopy.  Short PAT run with occasional PACs/PVCs and bigeminy.  No symptoms noted on monitor.  Longest PAT runs were less than 10 beats.  [name] BACK SURGERY     decompression  lower back   . CARDIAC CATHETERIZATION N/A 08/08/2015   Procedure: Left Heart Cath and Cors/Grafts Angiography;  Surgeon: Marykay Lex, MD;  Location: [facility] INVASIVE CV LAB;; -->  LM 80%-70%. Ost LAD 80% then prox-mid 100% (after small D1). Small LCx & OM2 patent with 100% OM2. Prox & distal RCA 20%, patent BMS in distal RCA-rPDA.  Patent/atretic LIMA-dLAD, SVG-D2 (backdills entire dLAD) & SVG-OM2.   [name] COLONOSCOPY    . CORONARY ANGIOPLASTY WITH STENT PLACEMENT  Erskine Squibb 2008   BMS PCI of distal RCA-RPDA: PCI with a 2.25 mm x 28 mm Mini Vision bare-metal stent.  . CORONARY ARTERY BYPASS GRAFT  [id number]   LIMA - LAD, SVG- diagonal, SVG-OM  . EYE SURGERY     BIL CATARACT  . KIDNEY STONE SURGERY    . KNEE ARTHROSCOPY     RT  KNEE  . LEFT HEART CATH AND CORS/GRAFTS ANGIOGRAPHY  11/2006   High-grade distal RCA and PDA stenosis --> BMS PCI.   [name] LEFT HEART CATH AND CORS/GRAFTS ANGIOGRAPHY  2010   Native coronaries at 70% to 80%  ostial left main. LAD had diffuse 80% to 90% stenosis  . NM MYOVIEW LTD  02/25/2004; Jan 2015   a) EF 57%; b)  Normal Nuclear Stress Test - No evidence of Ischemia or Infarction. Normal LV Function & wall motion.  [name] NM MYOVIEW LTD  03/17/2017   No significant reversible ischemia. Increased TID ratio 1.34. LVEF 54% with normal wall motion. This is a low risk study.  [name] POSTERIOR CERVICAL FUSION/FORAMINOTOMY N/A 07/14/2017   Procedure: LAMINECTOMY CERVICAL THREE- CERVICAL FOUR WITH LATERAL MASS FUSION;  Surgeon: Lisbeth Renshaw, MD;  Location: [facility] OR;  Service: Neurosurgery;  Laterality: N/A;  LAMINECTOMY CERVICAL 3- CERVICAL  4 WITH LATERAL MASS FUSION    MEDICATIONS: . acetaminophen (TYLENOL) 500 MG tablet  . azelastine (ASTELIN) 0.1 % nasal spray  . budesonide (RHINOCORT AQUA) 32 MCG/ACT nasal spray  . cholecalciferol (VITAMIN D) 1000 units tablet  . clopidogrel (PLAVIX) 75 MG tablet  . docusate sodium (COLACE) 100 MG capsule  . Histamine Dihydrochloride (AUSTRALIAN DREAM ARTHRITIS) 0.025 % CREA  . isosorbide mononitrate (IMDUR) 30 MG 24 hr tablet  . lisinopril (ZESTRIL) 5 MG tablet  . metoprolol succinate (TOPROL-XL) 50 MG 24 hr tablet  . montelukast (SINGULAIR) 10 MG tablet  . Multiple Vitamin (MULTIVITAMIN WITH MINERALS) TABS tablet  . nitroGLYCERIN (NITROSTAT) 0.4 MG SL tablet  . tamsulosin (FLOMAX) 0.4 MG CAPS capsule  . traMADol (ULTRAM) 50 MG tablet  . vitamin B-12 (CYANOCOBALAMIN) 1000 MCG tablet  . vitamin C (ASCORBIC ACID) 250 MG tablet  . zinc gluconate 50 MG tablet  . zolpidem (AMBIEN) 10 MG tablet   No current facility-administered medications for this encounter.   - Pt to hold plavix 5 days before surgery  If no changes, I anticipate pt can proceed with surgery as scheduled.   Rica Mast, PhD, FNP-BC [facility] Short Stay Surgical Center/Anesthesiology Phone: [phone] 04/03/2020 4:35 PM

## 2020-04-04 ENCOUNTER — Other Ambulatory Visit (HOSPITAL_COMMUNITY)
Admission: RE | Admit: 2020-04-04 | Discharge: 2020-04-04 | Disposition: A | Discharge status: Home / Self Care | Payer: Medicare Other | Source: Ambulatory Visit | Attending: Orthopedic Surgery | Admitting: Orthopedic Surgery

## 2020-04-04 DIAGNOSIS — Z01812 Encounter for preprocedural laboratory examination: Secondary | ICD-10-CM | POA: Insufficient documentation

## 2020-04-04 DIAGNOSIS — Z20822 Contact with and (suspected) exposure to covid-19: Secondary | ICD-10-CM | POA: Diagnosis not present

## 2020-04-05 LAB — SARS CORONAVIRUS 2 (TAT 6-24 HRS): SARS Coronavirus 2: NEGATIVE

## 2020-04-06 MED ORDER — BUPIVACAINE LIPOSOME 1.3 % IJ SUSP
20.0000 mL | INTRAMUSCULAR | Status: DC
  Filled 2020-04-06: qty 20

## 2020-04-07 ENCOUNTER — Ambulatory Visit (HOSPITAL_COMMUNITY): Payer: Medicare Other | Admitting: Anesthesiology

## 2020-04-07 ENCOUNTER — Other Ambulatory Visit: Payer: Self-pay

## 2020-04-07 ENCOUNTER — Encounter (HOSPITAL_COMMUNITY)
Admission: RE | Disposition: A | Discharge status: Home / Self Care | Payer: Self-pay | Source: Other Acute Inpatient Hospital | Attending: Orthopedic Surgery

## 2020-04-07 ENCOUNTER — Encounter (HOSPITAL_COMMUNITY): Payer: Self-pay | Admitting: Orthopedic Surgery

## 2020-04-07 ENCOUNTER — Ambulatory Visit (HOSPITAL_COMMUNITY)
Admission: RE | Admit: 2020-04-07 | Discharge: 2020-04-07 | Disposition: A | Discharge status: Home / Self Care | Payer: Medicare Other | Source: Other Acute Inpatient Hospital | Attending: Orthopedic Surgery | Admitting: Orthopedic Surgery

## 2020-04-07 ENCOUNTER — Ambulatory Visit (HOSPITAL_COMMUNITY): Payer: Medicare Other | Admitting: Emergency Medicine

## 2020-04-07 DIAGNOSIS — M1711 Unilateral primary osteoarthritis, right knee: Secondary | ICD-10-CM | POA: Diagnosis not present

## 2020-04-07 HISTORY — PX: TOTAL KNEE ARTHROPLASTY: SHX125

## 2020-04-07 SURGERY — ARTHROPLASTY, KNEE, TOTAL
Anesthesia: Spinal | Site: Knee | Laterality: Right

## 2020-04-07 MED ORDER — FENTANYL CITRATE (PF) 100 MCG/2ML IJ SOLN: 25.0000 ug | INTRAMUSCULAR | Status: DC | PRN

## 2020-04-07 MED ORDER — METHOCARBAMOL 500 MG PO TABS
500.0000 mg | ORAL_TABLET | Freq: Four times a day (QID) | ORAL | 0 refills | Status: DC
Start: 2020-04-07 — End: 2021-04-27

## 2020-04-07 MED ORDER — OXYCODONE HCL 5 MG PO TABS
ORAL_TABLET | ORAL | Status: AC
  Filled 2020-04-07: qty 1

## 2020-04-07 MED ORDER — LACTATED RINGERS IV BOLUS
500.0000 mL | Freq: Once | INTRAVENOUS | Status: AC
  Administered 2020-04-07: 500 mL via INTRAVENOUS

## 2020-04-07 MED ORDER — FENTANYL CITRATE (PF) 250 MCG/5ML IJ SOLN
INTRAMUSCULAR | Status: AC
  Filled 2020-04-07: qty 5

## 2020-04-07 MED ORDER — SODIUM CHLORIDE 0.9% FLUSH
INTRAVENOUS | Status: DC | PRN
  Administered 2020-04-07: 20 mL

## 2020-04-07 MED ORDER — DEXAMETHASONE SODIUM PHOSPHATE 10 MG/ML IJ SOLN: 8.0000 mg | Freq: Once | INTRAMUSCULAR | Status: DC

## 2020-04-07 MED ORDER — OXYCODONE HCL 5 MG PO TABS: 5.0000 mg | ORAL_TABLET | Freq: Once | ORAL | Status: DC | PRN

## 2020-04-07 MED ORDER — PROPOFOL 500 MG/50ML IV EMUL
INTRAVENOUS | Status: AC
  Filled 2020-04-07: qty 50

## 2020-04-07 MED ORDER — GABAPENTIN 300 MG PO CAPS
300.0000 mg | ORAL_CAPSULE | Freq: Once | ORAL | Status: AC
  Administered 2020-04-07: 300 mg via ORAL
  Filled 2020-04-07: qty 1

## 2020-04-07 MED ORDER — ONDANSETRON HCL 4 MG/2ML IJ SOLN
INTRAMUSCULAR | Status: AC
  Filled 2020-04-07: qty 2

## 2020-04-07 MED ORDER — OXYCODONE HCL 5 MG/5ML PO SOLN: 5.0000 mg | Freq: Once | ORAL | Status: DC | PRN

## 2020-04-07 MED ORDER — DEXAMETHASONE SODIUM PHOSPHATE 10 MG/ML IJ SOLN
INTRAMUSCULAR | Status: AC
  Filled 2020-04-07: qty 3

## 2020-04-07 MED ORDER — CELECOXIB 200 MG PO CAPS
400.0000 mg | ORAL_CAPSULE | Freq: Once | ORAL | Status: AC
  Administered 2020-04-07: 400 mg via ORAL
  Filled 2020-04-07: qty 2

## 2020-04-07 MED ORDER — ACETAMINOPHEN 500 MG PO TABS
1000.0000 mg | ORAL_TABLET | Freq: Once | ORAL | Status: AC
  Administered 2020-04-07: 1000 mg via ORAL
  Filled 2020-04-07: qty 2

## 2020-04-07 MED ORDER — ACETAMINOPHEN 500 MG PO TABS: 1000.0000 mg | ORAL_TABLET | Freq: Once | ORAL | Status: DC

## 2020-04-07 MED ORDER — WATER FOR IRRIGATION, STERILE IR SOLN
Status: DC | PRN
  Administered 2020-04-07: 2000 mL

## 2020-04-07 MED ORDER — CEFAZOLIN SODIUM-DEXTROSE 2-4 GM/100ML-% IV SOLN: 2.0000 g | Freq: Four times a day (QID) | INTRAVENOUS | Status: DC

## 2020-04-07 MED ORDER — OXYCODONE HCL 5 MG PO TABS
5.0000 mg | ORAL_TABLET | ORAL | Status: DC | PRN
  Administered 2020-04-07: 5 mg via ORAL

## 2020-04-07 MED ORDER — CHLORHEXIDINE GLUCONATE 0.12 % MT SOLN
15.0000 mL | Freq: Once | OROMUCOSAL | Status: AC
  Administered 2020-04-07: 15 mL via OROMUCOSAL

## 2020-04-07 MED ORDER — SUCCINYLCHOLINE CHLORIDE 200 MG/10ML IV SOSY
PREFILLED_SYRINGE | INTRAVENOUS | Status: AC
  Filled 2020-04-07: qty 10

## 2020-04-07 MED ORDER — ONDANSETRON HCL 4 MG/2ML IJ SOLN
INTRAMUSCULAR | Status: DC | PRN
  Administered 2020-04-07: 4 mg via INTRAVENOUS

## 2020-04-07 MED ORDER — EPHEDRINE 5 MG/ML INJ
INTRAVENOUS | Status: AC
  Filled 2020-04-07: qty 20

## 2020-04-07 MED ORDER — TRANEXAMIC ACID-NACL 1000-0.7 MG/100ML-% IV SOLN
1000.0000 mg | Freq: Once | INTRAVENOUS | Status: AC
  Administered 2020-04-07: 1000 mg via INTRAVENOUS

## 2020-04-07 MED ORDER — PHENYLEPHRINE HCL (PRESSORS) 10 MG/ML IV SOLN
INTRAVENOUS | Status: AC
  Filled 2020-04-07: qty 3

## 2020-04-07 MED ORDER — FENTANYL CITRATE (PF) 250 MCG/5ML IJ SOLN
INTRAMUSCULAR | Status: DC | PRN
  Administered 2020-04-07 (×2): 50 ug via INTRAVENOUS

## 2020-04-07 MED ORDER — POVIDONE-IODINE 10 % EX SWAB
2.0000 "application " | Freq: Once | CUTANEOUS | Status: AC
  Administered 2020-04-07: 2 via TOPICAL

## 2020-04-07 MED ORDER — LACTATED RINGERS IV SOLN: INTRAVENOUS | Status: DC

## 2020-04-07 MED ORDER — PHENYLEPHRINE HCL-NACL 10-0.9 MG/250ML-% IV SOLN
INTRAVENOUS | Status: DC | PRN
  Administered 2020-04-07: 25 ug/min via INTRAVENOUS

## 2020-04-07 MED ORDER — MEPIVACAINE HCL (PF) 2 % IJ SOLN
INTRAMUSCULAR | Status: DC | PRN
  Administered 2020-04-07: 60 mg via INTRATHECAL

## 2020-04-07 MED ORDER — DEXAMETHASONE SODIUM PHOSPHATE 10 MG/ML IJ SOLN
INTRAMUSCULAR | Status: DC | PRN
  Administered 2020-04-07: 8 mg via INTRAVENOUS

## 2020-04-07 MED ORDER — PROPOFOL 10 MG/ML IV BOLUS
INTRAVENOUS | Status: AC
  Filled 2020-04-07: qty 20

## 2020-04-07 MED ORDER — MIDAZOLAM HCL 2 MG/2ML IJ SOLN: 0.5000 mg | Freq: Once | INTRAMUSCULAR | Status: DC | PRN

## 2020-04-07 MED ORDER — ORAL CARE MOUTH RINSE: 15.0000 mL | Freq: Once | OROMUCOSAL | Status: AC

## 2020-04-07 MED ORDER — PROPOFOL 10 MG/ML IV BOLUS
INTRAVENOUS | Status: DC | PRN
  Administered 2020-04-07 (×2): 30 mg via INTRAVENOUS

## 2020-04-07 MED ORDER — MIDAZOLAM HCL 2 MG/2ML IJ SOLN
INTRAMUSCULAR | Status: AC
  Filled 2020-04-07: qty 2

## 2020-04-07 MED ORDER — ROCURONIUM BROMIDE 10 MG/ML (PF) SYRINGE
PREFILLED_SYRINGE | INTRAVENOUS | Status: AC
  Filled 2020-04-07: qty 10

## 2020-04-07 MED ORDER — SODIUM CHLORIDE 0.9 % IR SOLN
Status: DC | PRN
  Administered 2020-04-07: 1000 mL

## 2020-04-07 MED ORDER — ONDANSETRON HCL 4 MG/2ML IJ SOLN
INTRAMUSCULAR | Status: AC
  Filled 2020-04-07: qty 6

## 2020-04-07 MED ORDER — BUPIVACAINE-EPINEPHRINE 0.25% -1:200000 IJ SOLN
INTRAMUSCULAR | Status: DC | PRN
  Administered 2020-04-07: 30 mL

## 2020-04-07 MED ORDER — BUPIVACAINE-EPINEPHRINE (PF) 0.25% -1:200000 IJ SOLN
INTRAMUSCULAR | Status: AC
  Filled 2020-04-07: qty 30

## 2020-04-07 MED ORDER — OXYCODONE HCL 5 MG PO TABS
5.0000 mg | ORAL_TABLET | Freq: Four times a day (QID) | ORAL | 0 refills | Status: DC | PRN
Start: 2020-04-07 — End: 2020-10-08

## 2020-04-07 MED ORDER — EPHEDRINE SULFATE-NACL 50-0.9 MG/10ML-% IV SOSY
PREFILLED_SYRINGE | INTRAVENOUS | Status: DC | PRN
  Administered 2020-04-07 (×2): 10 mg via INTRAVENOUS

## 2020-04-07 MED ORDER — 0.9 % SODIUM CHLORIDE (POUR BTL) OPTIME
TOPICAL | Status: DC | PRN
  Administered 2020-04-07: 1000 mL

## 2020-04-07 MED ORDER — PROPOFOL 1000 MG/100ML IV EMUL
INTRAVENOUS | Status: AC
  Filled 2020-04-07: qty 300

## 2020-04-07 MED ORDER — PROPOFOL 500 MG/50ML IV EMUL
INTRAVENOUS | Status: DC | PRN
  Administered 2020-04-07: 50 ug/kg/min via INTRAVENOUS

## 2020-04-07 MED ORDER — LIDOCAINE 2% (20 MG/ML) 5 ML SYRINGE
INTRAMUSCULAR | Status: DC | PRN
  Administered 2020-04-07: 40 mg via INTRAVENOUS

## 2020-04-07 MED ORDER — ROPIVACAINE HCL 7.5 MG/ML IJ SOLN
INTRAMUSCULAR | Status: DC | PRN
  Administered 2020-04-07: 20 mL via PERINEURAL

## 2020-04-07 MED ORDER — LIDOCAINE 2% (20 MG/ML) 5 ML SYRINGE
INTRAMUSCULAR | Status: AC
  Filled 2020-04-07: qty 15

## 2020-04-07 MED ORDER — SODIUM CHLORIDE (PF) 0.9 % IJ SOLN
INTRAMUSCULAR | Status: AC
  Filled 2020-04-07: qty 50

## 2020-04-07 MED ORDER — TRANEXAMIC ACID-NACL 1000-0.7 MG/100ML-% IV SOLN
1000.0000 mg | INTRAVENOUS | Status: AC
  Administered 2020-04-07: 1000 mg via INTRAVENOUS
  Filled 2020-04-07: qty 100

## 2020-04-07 MED ORDER — TRANEXAMIC ACID-NACL 1000-0.7 MG/100ML-% IV SOLN
INTRAVENOUS | Status: AC
  Filled 2020-04-07: qty 100

## 2020-04-07 MED ORDER — MEPERIDINE HCL 50 MG/ML IJ SOLN: 6.2500 mg | INTRAMUSCULAR | Status: DC | PRN

## 2020-04-07 MED ORDER — LACTATED RINGERS IV BOLUS
250.0000 mL | Freq: Once | INTRAVENOUS | Status: AC
  Administered 2020-04-07: 250 mL via INTRAVENOUS

## 2020-04-07 MED ORDER — PROMETHAZINE HCL 25 MG/ML IJ SOLN: 6.2500 mg | INTRAMUSCULAR | Status: DC | PRN

## 2020-04-07 MED ORDER — BUPIVACAINE LIPOSOME 1.3 % IJ SUSP
INTRAMUSCULAR | Status: DC | PRN
  Administered 2020-04-07: 20 mL

## 2020-04-07 MED ORDER — CEFAZOLIN SODIUM-DEXTROSE 2-4 GM/100ML-% IV SOLN
2.0000 g | INTRAVENOUS | Status: AC
  Administered 2020-04-07: 2 g via INTRAVENOUS
  Filled 2020-04-07: qty 100

## 2020-04-07 MED ORDER — PHENYLEPHRINE 40 MCG/ML (10ML) SYRINGE FOR IV PUSH (FOR BLOOD PRESSURE SUPPORT)
PREFILLED_SYRINGE | INTRAVENOUS | Status: AC
  Filled 2020-04-07: qty 10

## 2020-04-07 SURGICAL SUPPLY — 58 items
BAG ZIPLOCK 12X15 (MISCELLANEOUS) ×3 IMPLANT
BLADE SAGITTAL 13X1.27X60 (BLADE) ×2 IMPLANT
BLADE SAGITTAL 13X1.27X60MM (BLADE) ×1
BLADE SAW SGTL 83.5X18.5 (BLADE) ×3 IMPLANT
BLADE SURG 15 STRL LF DISP TIS (BLADE) ×1 IMPLANT
BLADE SURG 15 STRL SS (BLADE) ×3
BLADE SURG SZ10 CARB STEEL (BLADE) ×6 IMPLANT
BNDG ELASTIC 6X5.8 VLCR STR LF (GAUZE/BANDAGES/DRESSINGS) ×3 IMPLANT
BOWL SMART MIX CTS (DISPOSABLE) ×3 IMPLANT
CEMENT BONE SIMPLEX SPEEDSET (Cement) ×6 IMPLANT
CLOSURE WOUND 1/2 X4 (GAUZE/BANDAGES/DRESSINGS) ×2
COVER SURGICAL LIGHT HANDLE (MISCELLANEOUS) ×3 IMPLANT
COVER WAND RF STERILE (DRAPES) IMPLANT
CUFF TOURN SGL QUICK 34 (TOURNIQUET CUFF) ×3
CUFF TRNQT CYL 34X4.125X (TOURNIQUET CUFF) ×1 IMPLANT
DECANTER SPIKE VIAL GLASS SM (MISCELLANEOUS) ×6 IMPLANT
DRAPE INCISE IOBAN 66X45 STRL (DRAPES) ×6 IMPLANT
DRAPE U-SHAPE 47X51 STRL (DRAPES) ×3 IMPLANT
DRSG AQUACEL AG ADV 3.5X10 (GAUZE/BANDAGES/DRESSINGS) ×3 IMPLANT
DURAPREP 26ML APPLICATOR (WOUND CARE) ×6 IMPLANT
ELECT REM PT RETURN 15FT ADLT (MISCELLANEOUS) ×3 IMPLANT
FEMUR  CMT CCR STD SZ11 R KNEE (Knees) ×3 IMPLANT
FEMUR CMT CCR STD SZ11 R KNEE (Knees) ×1 IMPLANT
FEMUR CMTD CCR STD SZ11 R KNEE (Knees) ×1 IMPLANT
GLOVE BIOGEL M STRL SZ7.5 (GLOVE) ×3 IMPLANT
GLOVE BIOGEL PI IND STRL 7.5 (GLOVE) ×1 IMPLANT
GLOVE BIOGEL PI IND STRL 8.5 (GLOVE) ×2 IMPLANT
GLOVE BIOGEL PI INDICATOR 7.5 (GLOVE) ×2
GLOVE BIOGEL PI INDICATOR 8.5 (GLOVE) ×4
GLOVE SURG ORTHO 8.0 STRL STRW (GLOVE) ×9 IMPLANT
GOWN STRL REUS W/ TWL XL LVL3 (GOWN DISPOSABLE) ×2 IMPLANT
GOWN STRL REUS W/TWL XL LVL3 (GOWN DISPOSABLE) ×6
HANDPIECE INTERPULSE COAX TIP (DISPOSABLE) ×3
HOLDER FOLEY CATH W/STRAP (MISCELLANEOUS) ×3 IMPLANT
HOOD PEEL AWAY FLYTE STAYCOOL (MISCELLANEOUS) ×9 IMPLANT
INSERT TIBIAL PLY R GH10-11X10 (Insert) ×3 IMPLANT
KIT TURNOVER KIT A (KITS) IMPLANT
MANIFOLD NEPTUNE II (INSTRUMENTS) ×3 IMPLANT
NEEDLE HYPO 22GX1.5 SAFETY (NEEDLE) ×3 IMPLANT
NS IRRIG 1000ML POUR BTL (IV SOLUTION) ×3 IMPLANT
PACK TOTAL KNEE CUSTOM (KITS) ×3 IMPLANT
PENCIL SMOKE EVACUATOR (MISCELLANEOUS) ×3 IMPLANT
PROTECTOR NERVE ULNAR (MISCELLANEOUS) ×3 IMPLANT
SET HNDPC FAN SPRY TIP SCT (DISPOSABLE) ×1 IMPLANT
STEM POLY PAT PLY 38M KNEE (Knees) ×3 IMPLANT
STEM TIBIA 5 DEG SZ G R KNEE (Knees) ×1 IMPLANT
STRIP CLOSURE SKIN 1/2X4 (GAUZE/BANDAGES/DRESSINGS) ×4 IMPLANT
SUT BONE WAX W31G (SUTURE) ×3 IMPLANT
SUT MNCRL AB 3-0 PS2 18 (SUTURE) ×3 IMPLANT
SUT STRATAFIX 0 PDS 27 VIOLET (SUTURE) ×3
SUT STRATAFIX PDS+ 0 24IN (SUTURE) ×3 IMPLANT
SUT VIC AB 1 CT1 36 (SUTURE) ×3 IMPLANT
SUTURE STRATFX 0 PDS 27 VIOLET (SUTURE) ×1 IMPLANT
SYR 30ML LL (SYRINGE) ×6 IMPLANT
TIBIA STEM 5 DEG SZ G R KNEE (Knees) ×3 IMPLANT
TRAY FOLEY MTR SLVR 16FR STAT (SET/KITS/TRAYS/PACK) ×3 IMPLANT
WATER STERILE IRR 1000ML POUR (IV SOLUTION) ×6 IMPLANT
WRAP KNEE MAXI GEL POST OP (GAUZE/BANDAGES/DRESSINGS) ×3 IMPLANT

## 2020-04-07 NOTE — Transfer of Care (Signed)
Immediate Anesthesia Transfer of Care Note  Patient: Daniel [name]  Procedure(s) Performed: TOTAL KNEE ARTHROPLASTY (Right Knee)  Patient Location: PACU  Anesthesia Type:Spinal  Level of Consciousness: awake and alert   Airway & Oxygen Therapy: Patient Spontanous Breathing and Patient connected to face mask oxygen  Post-op Assessment: Report given to RN and Post -op Vital signs reviewed and stable  Post vital signs: Reviewed and stable  Last Vitals:  Vitals Value Taken Time  BP 143/64 04/07/20 0859  Temp    Pulse 70 04/07/20 0859  Resp 13 04/07/20 0859  SpO2 100 % 04/07/20 0859  Vitals shown include unvalidated device data.  Last Pain:  Vitals:   04/07/20 0552  TempSrc: Oral      Patients Stated Pain Goal: 4 (04/07/20 0546)  Complications: No complications documented.

## 2020-04-07 NOTE — Evaluation (Signed)
Physical Therapy Evaluation Patient Details Name: Daniel [name] MRN: [id number] DOB: 1932/12/08 Today's Date: 04/07/2020   History of Present Illness  Patient is 86 y.o. male s/p Rt TKA on 04/07/20 with PMH significant for CAD s/p CABGx3, OA, HTN, PCF C3-4, lumbar sugery.    Clinical Impression  AVYAN LIVESAY is a 86 y.o. male POD 0 s/p Rt TKA. Patient reports independence with Rollator for mobility at baseline. Patient is now limited by functional impairments (see PT problem list below) and requires min guard/supervision for transfers and gait with RW. Patient was able to ambulate ~130 feet with RW and min guard/supervision and cues for safe walker management. Patient educated on safe sequencing for stair mobility and verbalized safe guarding position for people assisting with mobility. Patient instructed in exercises to facilitate ROM and circulation. Patient will benefit from continued skilled PT interventions to address impairments and progress towards PLOF. Patient has met mobility goals at adequate level for discharge home; will continue to follow if pt continues acute stay to progress towards Mod I goals.     Follow Up Recommendations Follow surgeon's recommendation for DC plan and follow-up therapies;Outpatient PT    Equipment Recommendations  None recommended by PT    Recommendations for Other Services       Precautions / Restrictions Precautions Precautions: Fall Restrictions Weight Bearing Restrictions: No      Mobility  Bed Mobility Overal bed mobility: Needs Assistance Bed Mobility: Supine to Sit     Supine to sit: Supervision     General bed mobility comments: no assist required to sit up EOB, pt using bed rail to raise self up and taking some extra time.    Transfers Overall transfer level: Needs assistance Equipment used: Rolling walker (2 wheeled) Transfers: Sit to/from Stand Sit to Stand: Min guard;Supervision         General transfer comment: cues  for safety with hand placement on RW, no assist required for power up and pt steady in standing. Cues for safe reach back with sitting, and to extend Rt LE forward to prevent excessive flexion with sitting.   Ambulation/Gait Ambulation/Gait assistance: Min guard;Supervision Gait Distance (Feet): 130 Feet Assistive device: Rolling walker (2 wheeled) Gait Pattern/deviations: Step-to pattern;Decreased stride length;Trunk flexed;Decreased weight shift to right Gait velocity: decr   General Gait Details: trunk slightly flexed and pt looking down, cues for posture at start. VC's for safe step pattern and proximity to RW, no overt LOB or buckling at Rt knee noted. pt's son present for session and educated on safe guarding, demonstrated safe position while ambulating with pt.   Stairs Stairs: Yes Stairs assistance: Min guard;Supervision Stair Management: Two rails;Step to pattern;Forwards Number of Stairs: 3 General stair comments: cues for safe step pattern "up with good, down with bad" and safe guarding position for family to provide. No overt LOB noted pt/family completed in safe manner.   Wheelchair Mobility    Modified Rankin (Stroke Patients Only)       Balance Overall balance assessment: Needs assistance Sitting-balance support: Feet supported Sitting balance-Leahy Scale: Good     Standing balance support: During functional activity;Bilateral upper extremity supported Standing balance-Leahy Scale: Fair                               Pertinent Vitals/Pain Pain Assessment: 0-10 Pain Score: 5  Pain Location: Rt knee Pain Descriptors / Indicators: Aching;Discomfort Pain Intervention(s): Limited activity within patient's tolerance;Monitored  during session;Repositioned    Home Living Family/patient expects to be discharged to:: Private residence Living Arrangements: Alone Available Help at Discharge: Family Type of Home: House Home Access: Stairs to enter;Level  entry Entrance Stairs-Rails: Right;Left;Can reach both Entrance Stairs-Number of Steps: 1 step from garage [place]: One level Home Equipment: Shower seat;Toilet riser;Walker - 2 wheels;Walker - 4 wheels;Cane - single point;Bedside commode Additional Comments: children and grandchildren will be with him at home 24/7 for ~ 3weeks    Prior Function Level of Independence: Independent with assistive device(s)         Comments: pt has been using Rollator for mobility for ~2 years and reports he has relied heavily on it in the last couple months due to Rt knee buckling. He denies falls in last 6 months.      Hand Dominance   Dominant Hand: Right    Extremity/Trunk Assessment   Upper Extremity Assessment Upper Extremity Assessment: Overall WFL for tasks assessed    Lower Extremity Assessment Lower Extremity Assessment: Overall WFL for tasks assessed    Cervical / Trunk Assessment Cervical / Trunk Assessment: Normal  Communication   Communication: No difficulties  Cognition Arousal/Alertness: Awake/alert Behavior During Therapy: WFL for tasks assessed/performed Overall Cognitive Status: Within Functional Limits for tasks assessed                                        General Comments      Exercises Total Joint Exercises Ankle Circles/Pumps: AROM;Both;10 reps;Seated Quad Sets: AROM;Right;5 reps;Seated Short Arc Quad: AROM;Right;5 reps;Seated Heel Slides: AROM;Right;5 reps;Seated Hip ABduction/ADduction: AROM;Right;5 reps;Seated Straight Leg Raises: AROM;Right;Other reps (comment);Seated (2) Long Arc Quad: AROM;Right;5 reps;Seated Knee Flexion: Right;5 reps;Seated;AROM;AAROM   Assessment/Plan    PT Assessment Patient needs continued PT services  PT Problem List Decreased strength;Decreased range of motion;Decreased activity tolerance;Decreased balance;Decreased mobility;Decreased knowledge of use of DME;Decreased safety awareness;Decreased knowledge  of precautions;Pain       PT Treatment Interventions DME instruction;Gait training;Stair training;Functional mobility training;Therapeutic activities;Therapeutic exercise;Balance training;Patient/family education    PT Goals (Current goals can be found in the Care Plan section)  Acute Rehab PT Goals Patient Stated Goal: to return home and recover so Rt knee stops buckling. PT Goal Formulation: With patient Time For Goal Achievement: 04/14/20 Potential to Achieve Goals: Good    Frequency 7X/week   Barriers to discharge        Co-evaluation               AM-PAC PT "6 Clicks" Mobility  Outcome Measure Help needed turning from your back to your side while in a flat bed without using bedrails?: None Help needed moving from lying on your back to sitting on the side of a flat bed without using bedrails?: A Little Help needed moving to and from a bed to a chair (including a wheelchair)?: A Little Help needed standing up from a chair using your arms (e.g., wheelchair or bedside chair)?: A Little Help needed to walk in hospital room?: A Little Help needed climbing 3-5 steps with a railing? : A Little 6 Click Score: 19    End of Session Equipment Utilized During Treatment: Gait belt Activity Tolerance: Patient tolerated treatment well Patient left: in chair;with family/visitor present;with nursing/sitter in room Nurse Communication: Mobility status PT Visit Diagnosis: Muscle weakness (generalized) (M62.81);Difficulty in walking, not elsewhere classified (R26.2)    Time: 4268-3419 PT Time Calculation (  min) (ACUTE ONLY): 34 min   Charges:   PT Evaluation $PT Eval Low Complexity: 1 Low PT Treatments $Gait Training: 8-22 mins        Verner Mould, DPT Acute Rehabilitation Services  Office [phone] Pager [phone]  04/07/2020 1:12 PM

## 2020-04-07 NOTE — Anesthesia Procedure Notes (Signed)
Date/Time: 04/07/2020 7:13 AM Performed by: Minerva Ends, CRNA Pre-anesthesia Checklist: Patient identified, Suction available, Emergency Drugs available, Patient being monitored and Timeout performed Patient Re-evaluated:Patient Re-evaluated prior to induction Oxygen Delivery Method: Simple face mask Placement Confirmation: positive ETCO2 and breath sounds checked- equal and bilateral Dental Injury: Teeth and Oropharynx as per pre-operative assessment  Difficulty Due To: Difficult Airway- due to limited oral opening, Difficult Airway- due to anterior larynx and Difficult Airway- due to reduced neck mobility Comments: HISTORY OF DIFFICULT INTUBATION-- SMALL CHIN-- VERY LIMITED NECK MOBILITY AND MOUTH OPENING-- GLIDESCOPE AVAILABLE

## 2020-04-07 NOTE — H&P (Signed)
MARCELIS WISSNER MRN:  [id number] DOB/SEX:  Mar 29, 1933/male  CHIEF COMPLAINT:  Painful right Knee  HISTORY: Patient is a 86 y.o. male presented with a history of pain in the right knee. Onset of symptoms was gradual starting a few years ago with gradually worsening course since that time. Patient has been treated conservatively with over-the-counter NSAIDs and activity modification. Patient currently rates pain in the knee at 10 out of 10 with activity. There is pain at night.  PAST MEDICAL HISTORY: Patient Active Problem List   Diagnosis Date Noted  . Stenosis of cervical spine with myelopathy (HCC) 07/14/2017  . DOE (dyspnea on exertion) 03/10/2017  . Acute maxillary sinusitis 02/15/2017  . Allergic rhinitis 02/15/2017  . Preoperative cardiovascular examination 08/16/2016  . Chronic pain of both knees 07/13/2016  . Fatigue due to treatment 09/11/2015  . Unstable angina (HCC) 08/06/2015  . Low TSH level 08/13/2014  . Orthostatic hypotension 12/17/2013  . Pseudoptosis 05/22/2013  . Dermatochalasis 02/21/2013  . Epiphora 02/21/2013  . S/P CABG x 3   . Essential hypertension   . Dyslipidemia, goal LDL below 70   . Heart palpitations   . Nuclear cataract 01/26/2012  . PCO (posterior capsular opacification) 01/26/2012  . Pseudophakia 01/26/2012  . LM-CAD - atretic LIMA-LAD, patent SVG-D2-> fills LAD & SVG-OM, BMS PCI rPDA 07/27/1995   Past Medical History:  Diagnosis Date  . Arthritis   . CAD of autologous arterial graft 2010   Atretic LIMA; Myoview Jan 2015: Possible mild basal anteroseptal defect thought to be artifact (CRO early LAD ischemia due to inadeuate retrograde perfusion via SVG-Diag  . CAD S/P percutaneous coronary angioplasty 11/2006   a. 6/'08: PCI nongrafted distal RCA-PDA: Mini vision BMS 2.25 mm x 28 mm; b. Relook Cath 08/2015: Stable CAD. No culprit lesion. Widely patent PDA stent. Patent SVG-OM 2, SVG-D2. Essentially atretic/small LIMA-LAD, but the LAD is perfused via  SVG-D2  . Complication of anesthesia   . Coronary artery disease involving left main coronary artery [id number]   Last cath 2010: 70-80 % ostial left main, 80-90% LAD, subtotal CTO LCx-OM2, RCA patent w/ patent BMS stent (dRCA-PDA); SVG-diagonal backfills LAD, atretic LIMA-LAD -- medical therapy; stable by relook cath in March 2017  . Difficult intubation    VERY NARROW AIRWAY PER ANESTHESIOLOGIST (1997 CABG AT [facility])  . Dyslipidemia, goal LDL below 70   . Dysrhythmia   . Heart palpitations    Never fully diagnosed.  [name] History of kidney stones   . Hypertension   . Insomnia   . Mass in chest    JUST WATCHING , WILL CHECK OUT AFTER SURGERY  . Pneumonia   . S/P CABG x 3 1997   LIMA-LAD, SVG-D2, SVG-OM --> LIMA known to be atretic, but SVG-D2 fills diagonal and LAD  . Stenosis of cervical spine with myelopathy (HCC)   . Tingling    in fingers   Past Surgical History:  Procedure Laterality Date  . 48-HOUR MONITOR  03/2017   Relatively normal.  Very infrequent ectopy.  Short PAT run with occasional PACs/PVCs and bigeminy.  No symptoms noted on monitor.  Longest PAT runs were less than 10 beats.  [name] BACK SURGERY     decompression  lower back   . CARDIAC CATHETERIZATION N/A 08/08/2015   Procedure: Left Heart Cath and Cors/Grafts Angiography;  Surgeon: Marykay Lex, MD;  Location: [facility] INVASIVE CV LAB;; -->  LM 80%-70%. Ost LAD 80% then prox-mid 100% (after small D1). Small LCx &  OM2 patent with 100% OM2. Prox & distal RCA 20%, patent BMS in distal RCA-rPDA.  Patent/atretic LIMA-dLAD, SVG-D2 (backdills entire dLAD) & SVG-OM2.   [name] COLONOSCOPY    . CORONARY ANGIOPLASTY WITH STENT PLACEMENT  Erskine Squibb 2008   BMS PCI of distal RCA-RPDA: PCI with a 2.25 mm x 28 mm Mini Vision bare-metal stent.  . CORONARY ARTERY BYPASS GRAFT  [id number]   LIMA - LAD, SVG- diagonal, SVG-OM  . EYE SURGERY     BIL CATARACT  . KIDNEY STONE SURGERY    . KNEE ARTHROSCOPY     RT  KNEE  . LEFT HEART CATH AND CORS/GRAFTS  ANGIOGRAPHY  11/2006   High-grade distal RCA and PDA stenosis --> BMS PCI.   [name] LEFT HEART CATH AND CORS/GRAFTS ANGIOGRAPHY  2010   Native coronaries at 70% to 80% ostial left main. LAD had diffuse 80% to 90% stenosis  . NM MYOVIEW LTD  02/25/2004; Jan 2015   a) EF 57%; b)  Normal Nuclear Stress Test - No evidence of Ischemia or Infarction. Normal LV Function & wall motion.  [name] NM MYOVIEW LTD  03/17/2017   No significant reversible ischemia. Increased TID ratio 1.34. LVEF 54% with normal wall motion. This is a low risk study.  [name] POSTERIOR CERVICAL FUSION/FORAMINOTOMY N/A 07/14/2017   Procedure: LAMINECTOMY CERVICAL THREE- CERVICAL FOUR WITH LATERAL MASS FUSION;  Surgeon: Lisbeth Renshaw, MD;  Location: [facility] OR;  Service: Neurosurgery;  Laterality: N/A;  LAMINECTOMY CERVICAL 3- CERVICAL 4 WITH LATERAL MASS FUSION     MEDICATIONS:   Medications Prior to Admission  Medication Sig Dispense Refill Last Dose  . acetaminophen (TYLENOL) 500 MG tablet Take 1,000 mg by mouth every 8 (eight) hours as needed for mild pain.   04/06/2020 at Unknown time  . azelastine (ASTELIN) 0.1 % nasal spray Place 1-2 sprays into both nostrils 2 (two) times daily.    Past Week at Unknown time  . budesonide (RHINOCORT AQUA) 32 MCG/ACT nasal spray Place 1 spray into both nostrils daily.    Past Week at Unknown time  . cholecalciferol (VITAMIN D) 1000 units tablet Take 1,000 Units by mouth daily.   Past Week at Unknown time  . clopidogrel (PLAVIX) 75 MG tablet Take 1 tablet (75 mg total) by mouth daily. 90 tablet 3 03/30/2020 at 0800  . docusate sodium (COLACE) 100 MG capsule Take 200-300 mg by mouth daily as needed for mild constipation or moderate constipation.   Past Week at Unknown time  . Histamine Dihydrochloride (AUSTRALIAN DREAM ARTHRITIS) 0.025 % CREA Apply 1 application topically daily as needed (For Knee pain).    Past Week at Unknown time  . isosorbide mononitrate (IMDUR) 30 MG 24 hr tablet Take 30 mg by mouth daily.    04/06/2020 at Unknown time  . lisinopril (ZESTRIL) 5 MG tablet TAKE 1 TABLET(5 MG) BY MOUTH DAILY (Patient taking differently: Take 5 mg by mouth daily. ) 90 tablet 3 04/07/2020 at Unknown time  . metoprolol succinate (TOPROL-XL) 50 MG 24 hr tablet Take 1and 1/2 tablets (75 mg)  in the morning and take 1 tablet ( 50 mg) in evening. (Patient taking differently: Take 50-75 mg by mouth See admin instructions. Take 75 mg  in the morning and take 50 mg in evening.) 270 tablet 3 04/07/2020 at Unknown time  . montelukast (SINGULAIR) 10 MG tablet Take 10 mg by mouth at bedtime.   Past Week at Unknown time  . Multiple Vitamin (MULTIVITAMIN WITH MINERALS) TABS tablet Take  1 tablet by mouth daily.   Past Week at Unknown time  . tamsulosin (FLOMAX) 0.4 MG CAPS capsule Take 0.4 mg by mouth at bedtime.   0 04/06/2020 at Unknown time  . traMADol (ULTRAM) 50 MG tablet Take 100 mg by mouth 2 (two) times daily.      . vitamin B-12 (CYANOCOBALAMIN) 1000 MCG tablet Take 1,000 mcg by mouth 2 (two) times daily.    Past Week at Unknown time  . vitamin C (ASCORBIC ACID) 250 MG tablet Take 250 mg by mouth daily.   Past Week at Unknown time  . zinc gluconate 50 MG tablet Take 50 mg by mouth in the morning and at bedtime.   Past Week at Unknown time  . zolpidem (AMBIEN) 10 MG tablet Take 10 mg by mouth at bedtime as needed for sleep.   04/06/2020 at Unknown time  . nitroGLYCERIN (NITROSTAT) 0.4 MG SL tablet PLACE 1 TABLET UNDER THE TONGUE IF NEEDED EVERY 5 MINUTES FOR CHEST PAIN FOR 3 DOSES; IF NO RELIEF AFTER FIRST DOSE CALL PRESCRIBER OR 911. (Patient taking differently: Place 0.4 mg under the tongue every 5 (five) minutes as needed for chest pain. EVERY 5 MINUTES FOR CHEST PAIN FOR 3 DOSES; IF NO RELIEF AFTER FIRST DOSE CALL PRESCRIBER OR 911.) 25 tablet 2 Unknown at Unknown time    ALLERGIES:   Allergies  Allergen Reactions  . Penicillins Swelling and Other (See Comments)    Swelling and redness in joints  Has patient  had a PCN reaction causing immediate rash, facial/tongue/throat swelling, SOB or lightheadedness with hypotension: No Has patient had a PCN reaction causing severe rash involving mucus membranes or skin necrosis: No Has patient had a PCN reaction that required hospitalization: No Has patient had a PCN reaction occurring within the last 10 years: No If all of the above answers are "NO", then may proceed with Cephalosporin use.   . Sulfa Antibiotics Other (See Comments)    Pain in left side underneath ribcage-pancreas  . Statins Other (See Comments)    UNSPECIFIED REACTION  "INTOLERANCE AT HIGH DOSES"    REVIEW OF SYSTEMS:  A comprehensive review of systems was negative except for: Musculoskeletal: positive for arthralgias and bone pain   FAMILY HISTORY:   Family History  Problem Relation Age of Onset  . Stroke Mother 47  . Heart disease Mother   . Arthritis Mother   . Heart attack Father 36  . Other Sister   . Heart disease Brother   . Allergic rhinitis Neg Hx   . Atopy Neg Hx   . Angioedema Neg Hx   . Asthma Neg Hx   . Eczema Neg Hx   . Immunodeficiency Neg Hx   . Urticaria Neg Hx     SOCIAL HISTORY:   Social History   Tobacco Use  . Smoking status: Never Smoker  . Smokeless tobacco: Never Used  Substance Use Topics  . Alcohol use: No     EXAMINATION:  Vital signs in last 24 hours: Temp:  [97.8 F (36.6 C)] 97.8 F (36.6 C) (11/01 0552) Pulse Rate:  [70] 70 (11/01 0552) Resp:  [14] 14 (11/01 0552) BP: (179)/(79) 179/79 (11/01 0552) SpO2:  [100 %] 100 % (11/01 0552) Weight:  [85 kg] 85 kg (11/01 0546)  BP (!) 179/79   Pulse 70   Temp 97.8 F (36.6 C) (Oral)   Resp 14   Ht 6\' 3"  (1.905 m)   Wt 85 kg   SpO2  100%   BMI 23.41 kg/m   General Appearance:    Alert, cooperative, no distress, appears stated age  Head:    Normocephalic, without obvious abnormality, atraumatic  Eyes:    PERRL, conjunctiva/corneas clear, EOM's intact, fundi    benign, both  eyes       Ears:    Normal TM's and external ear canals, both ears  Nose:   Nares normal, septum midline, mucosa normal, no drainage    or sinus tenderness  Throat:   Lips, mucosa, and tongue normal; teeth and gums normal  Neck:   Supple, symmetrical, trachea midline, no adenopathy;       thyroid:  No enlargement/tenderness/nodules; no carotid   bruit or JVD  Back:     Symmetric, no curvature, ROM normal, no CVA tenderness  Lungs:     Clear to auscultation bilaterally, respirations unlabored  Chest wall:    No tenderness or deformity  Heart:    Regular rate and rhythm, S1 and S2 normal, no murmur, rub   or gallop  Abdomen:     Soft, non-tender, bowel sounds active all four quadrants,    no masses, no organomegaly  Genitalia:    Normal male without lesion, discharge or tenderness  Rectal:    Normal tone, normal prostate, no masses or tenderness;   guaiac negative stool  Extremities:   Extremities normal, atraumatic, no cyanosis or edema  Pulses:   2+ and symmetric all extremities  Skin:   Skin color, texture, turgor normal, no rashes or lesions  Lymph nodes:   Cervical, supraclavicular, and axillary nodes normal  Neurologic:   CNII-XII intact. Normal strength, sensation and reflexes      throughout    Musculoskeletal:  ROM 0-120, Ligaments intact,  Imaging Review Plain radiographs demonstrate severe degenerative joint disease of the right knee. The overall alignment is neutral. The bone quality appears to be good for age and reported activity level.  Assessment/Plan: Primary osteoarthritis, right knee   The patient history, physical examination and imaging studies are consistent with advanced degenerative joint disease of the right knee. The patient has failed conservative treatment.  The clearance notes were reviewed.  After discussion with the patient it was felt that Total Knee Replacement was indicated. The procedure,  risks, and benefits of total knee arthroplasty were presented  and reviewed. The risks including but not limited to aseptic loosening, infection, blood clots, vascular injury, stiffness, patella tracking problems complications among others were discussed. The patient acknowledged the explanation, agreed to proceed with the plan.  Preoperative templating of the joint replacement has been completed, documented, and submitted to the Operating Room personnel in order to optimize intra-operative equipment management.    Patient's anticipated LOS is less than 2 midnights, meeting these requirements: - Lives within 1 hour of care - Has a competent adult at home to recover with post-op recover - NO history of  - Chronic pain requiring opiods  - Diabetes  - Coronary Artery Disease  - Heart failure  - Heart attack  - Stroke  - DVT/VTE  - Cardiac arrhythmia  - Respiratory Failure/COPD  - Renal failure  - Anemia  - Advanced Liver disease       Guy SandiferColby Alan Lesta Limbert 04/07/2020, 7:06 AM

## 2020-04-07 NOTE — Anesthesia Procedure Notes (Addendum)
Anesthesia Regional Block: Adductor canal block   Pre-Anesthetic Checklist: ,, timeout performed, Correct Patient, Correct Site, Correct Laterality, Correct Procedure, Correct Position, site marked, Risks and benefits discussed,  Surgical consent,  Pre-op evaluation,  At surgeon's request and post-op pain management  Laterality: Right and Lower  Prep: chloraprep       Needles:  Injection technique: Single-shot  Needle Type: Echogenic Needle     Needle Length: 9cm  Needle Gauge: 21     Additional Needles:   Procedures:,,,, ultrasound used (permanent image in chart),,,,  Narrative:  Start time: 04/07/2020 6:56 AM End time: 04/07/2020 7:01 AM Injection made incrementally with aspirations every 5 mL.  Performed by: Personally  Anesthesiologist: Jairo Ben, MD  Additional Notes: Pt identified in Holding room.  Monitors applied. Working IV access confirmed. Sterile prep R thigh.  #21ga ECHOgenic Arrow block needle into adductor canal with US guidance.  20cc 0.75% Ropivacaine injected incrementally after negative test dose.  Patient asymptomatic, VSS, no heme aspirated, tolerated well.  Sandford Craze, MD

## 2020-04-07 NOTE — Anesthesia Postprocedure Evaluation (Signed)
Anesthesia Post Note  Patient: Daniel [name]  Procedure(s) Performed: TOTAL KNEE ARTHROPLASTY (Right Knee)     Patient location during evaluation: PACU Anesthesia Type: Spinal Level of consciousness: awake and alert, patient cooperative and oriented Pain management: pain level controlled Vital Signs Assessment: post-procedure vital signs reviewed and stable Respiratory status: spontaneous breathing, nonlabored ventilation and respiratory function stable Cardiovascular status: stable and blood pressure returned to baseline Postop Assessment: no apparent nausea or vomiting, spinal receding and patient able to bend at knees Anesthetic complications: no   No complications documented.  Last Vitals:  Vitals:   04/07/20 1000 04/07/20 1015  BP: (!) 168/79 (!) 169/78  Pulse: 66 71  Resp: 12 14  Temp:    SpO2: 99% 99%    Last Pain:  Vitals:   04/07/20 1000  TempSrc:   PainSc: 0-No pain                 Yaritzi Craun,E. Kalieb Freeland

## 2020-04-07 NOTE — Anesthesia Procedure Notes (Signed)
Procedure Name: MAC Date/Time: 04/07/2020 6:50 AM Performed by: Cynda Familia, CRNA Pre-anesthesia Checklist: Patient identified, Emergency Drugs available, Suction available, Patient being monitored and Timeout performed Patient Re-evaluated:Patient Re-evaluated prior to induction Oxygen Delivery Method: Nasal cannula Placement Confirmation: positive ETCO2 and breath sounds checked- equal and bilateral Dental Injury: Teeth and Oropharynx as per pre-operative assessment

## 2020-04-07 NOTE — Anesthesia Procedure Notes (Addendum)
Spinal  Patient location during procedure: OR End time: 04/07/2020 7:24 AM Staffing Performed: anesthesiologist  Anesthesiologist: Jairo Ben, MD Resident/CRNA: Minerva Ends, CRNA Preanesthetic Checklist Completed: patient identified, IV checked, site marked, risks and benefits discussed, surgical consent, monitors and equipment checked, pre-op evaluation and timeout performed Spinal Block Patient position: sitting Prep: DuraPrep and site prepped and draped Patient monitoring: blood pressure, continuous pulse ox, cardiac monitor and heart rate Approach: midline Location: L2-3 Injection technique: single-shot Needle Needle type: Pencan and Introducer  Needle gauge: 25 G Needle length: 9 cm Additional Notes Pt identified in Operating room.  Monitors applied. Working IV access confirmed. Sterile prep, drape lumbar spine.  CRNA 1% lido local L 3,4. All attempts os. Repeat 1% lido local L2,3 and #24ga Pencan into clear CSF L 2,3.  60mg  2% Mepivacaine injected with asp CSF beginning and end of injection.  Patient asymptomatic, VSS, no heme aspirated, tolerated well.  , MD

## 2020-04-07 NOTE — Op Note (Signed)
TOTAL KNEE REPLACEMENT OPERATIVE NOTE:  04/07/2020  9:15 AM  PATIENT:  Daniel [name]  86 y.o. male  PRE-OPERATIVE DIAGNOSIS:  Osteoarthritis of right knee M17.11  POST-OPERATIVE DIAGNOSIS:  Osteoarthritis of right knee M17.11  PROCEDURE:  Procedure(s): TOTAL KNEE ARTHROPLASTY  SURGEON:  Surgeon(s): Dannielle Huh, MD  PHYSICIAN ASSISTANT: Laurier Nancy, PA-C  ANESTHESIA:   spinal  SPECIMEN: None  COUNTS:  Correct  TOURNIQUET:   Total Tourniquet Time Documented: Thigh (Right) - 41 minutes Total: Thigh (Right) - 41 minutes   DICTATION:  Indication for procedure:    The patient is a 86 y.o. male who has failed conservative treatment for Osteoarthritis of right knee M17.11.  Informed consent was obtained prior to anesthesia. The risks versus benefits of the operation were explain and in a way the patient can, and did, understand.    Description of procedure:     The patient was taken to the operating room and placed under anesthesia.  The patient was positioned in the usual fashion taking care that all body parts were adequately padded and/or protected.  A tourniquet was applied and the leg prepped and draped in the usual sterile fashion.  The extremity was exsanguinated with the esmarch and tourniquet inflated to 250 mmHg.  Pre-operative range of motion was normal.    A midline incision approximately 6-7 inches long was made with a #10 blade.  A new blade was used to make a parapatellar arthrotomy going 2-3 cm into the quadriceps tendon, over the patella, and alongside the medial aspect of the patellar tendon.  A synovectomy was then performed with the #10 blade and forceps. I then elevated the deep MCL off the medial tibial metaphysis subperiosteally around to the semimembranosus attachment.    I everted the patella and used calipers to measure patellar thickness.  I used the reamer to ream down to appropriate thickness to recreate the native thickness.  I then removed excess  bone with the rongeur and sagittal saw.  I used the appropriately sized template and drilled the three lug holes.  I then put the trial in place and measured the thickness with the calipers to ensure recreation of the native thickness.  The trial was then removed and the patella subluxed and the knee brought into flexion.  A homan retractor was place to retract and protect the patella and lateral structures.  A Z-retractor was place medially to protect the medial structures.  The extra-medullary alignment system was used to make cut the tibial articular surface perpendicular to the anamotic axis of the tibia and in 3 degrees of posterior slope.  The cut surface and alignment jig was removed.  I then used the intramedullary alignment guide to make a  valgus cut on the distal femur.  I then marked out the epicondylar axis on the distal femur.  I then used the anterior referencing sizer and measured the femur to be a size 11.  The 4-In-1 cutting block was screwed into place in external rotation matching the posterior condylar angle, making our cuts perpendicular to the epicondylar axis.  Anterior, posterior and chamfer cuts were made with the sagittal saw.  The cutting block and cut pieces were removed.  A lamina spreader was placed in 90 degrees of flexion.  The ACL, PCL, menisci, and posterior condylar osteophytes were removed.  A 10 mm spacer blocked was found to offer good flexion and extension gap balance after minimal in degree releasing.   The scoop retractor was then placed  and the femoral finishing block was pinned in place.  The small sagittal saw was used as well as the lug drill to finish the femur.  The block and cut surfaces were removed and the medullary canal hole filled with autograft bone from the cut pieces.  The tibia was delivered forward in deep flexion and external rotation.  A size G tray was selected and pinned into place centered on the medial 1/3 of the tibial tubercle.  The reamer  and keel was used to prepare the tibia through the tray.    I then trialed with the size 11 femur, size G tibia, a 10 mm insert and the 38 patella.  I had excellent flexion/extension gap balance, excellent patella tracking.  Flexion was full and beyond 120 degrees; extension was zero.  These components were chosen and the staff opened them to me on the back table while the knee was lavaged copiously and the cement mixed.  The soft tissue was infiltrated with 60cc of exparel 1.3% through a 21 gauge needle.  I cemented in the components and removed all excess cement.  The polyethylene tibial component was snapped into place and the knee placed in extension while cement was hardening.  The capsule was infilltrated with a 60cc exparel/marcaine/saline mixture.   Once the cement was hard, the tourniquet was let down.  Hemostasis was obtained.  The arthrotomy was closed using a #1 stratofix running suture.  The deep soft tissues were closed with #0 vicryls and the subcuticular layer closed with #2-0 vicryl.  The skin was reapproximated and closed with 3.0 Monocryl.  The wound was covered with steristrips, aquacel dressing, and a TED stocking.   The patient was then awakened, extubated, and taken to the recovery room in stable condition.  BLOOD LOSS:  300cc COMPLICATIONS:  None.  PLAN OF CARE: Discharge to home after PACU  PATIENT DISPOSITION:  PACU - hemodynamically stable.    Please fax a copy of this op note to my office at [phone] (please only include page 1 and 2 of the Case Information op note)

## 2020-04-08 ENCOUNTER — Encounter (HOSPITAL_COMMUNITY): Payer: Self-pay | Admitting: Orthopedic Surgery

## 2020-04-21 ENCOUNTER — Other Ambulatory Visit: Payer: Self-pay | Admitting: Cardiology

## 2020-06-24 ENCOUNTER — Other Ambulatory Visit: Payer: Self-pay | Admitting: *Deleted

## 2020-06-24 ENCOUNTER — Telehealth: Payer: Self-pay | Admitting: Allergy and Immunology

## 2020-06-24 MED ORDER — CLINDAMYCIN HCL 150 MG PO CAPS: 150.0000 mg | ORAL_CAPSULE | Freq: Three times a day (TID) | ORAL | 0 refills | Status: AC

## 2020-06-24 NOTE — Telephone Encounter (Signed)
Clindamycin 150 - 1 tablet 3 times a day for 20 days

## 2020-06-24 NOTE — Telephone Encounter (Signed)
Medication has been sent in. Called patient and advised, patient verbalized understanding.

## 2020-06-24 NOTE — Telephone Encounter (Addendum)
Patient called and said that he has a sinus infection yellow and greenish drainage. His left eye is sore and needs a antibiotic. Can not come in because he just had knee surgery. He said e has this every year this time. [phone]. [facility].    Patient needs a Z-pak

## 2020-06-24 NOTE — Telephone Encounter (Signed)
Dr. Lucie Leather the patient was last seen in 07/2019. Can you advise on further instructions for patient?

## 2020-07-29 ENCOUNTER — Other Ambulatory Visit: Payer: Self-pay

## 2020-07-29 ENCOUNTER — Ambulatory Visit: Payer: Medicare Other | Admitting: Allergy and Immunology

## 2020-07-29 ENCOUNTER — Encounter: Payer: Self-pay | Admitting: Allergy and Immunology

## 2020-07-29 VITALS — BP 142/80 | HR 88 | Temp 98.1°F | Resp 14 | Ht 75.0 in | Wt 186.0 lb

## 2020-07-29 DIAGNOSIS — J3089 Other allergic rhinitis: Secondary | ICD-10-CM | POA: Diagnosis not present

## 2020-07-29 DIAGNOSIS — J324 Chronic pansinusitis: Secondary | ICD-10-CM

## 2020-07-29 MED ORDER — MONTELUKAST SODIUM 10 MG PO TABS
10.0000 mg | ORAL_TABLET | Freq: Every day | ORAL | 5 refills | Status: DC
Start: 2020-07-29 — End: 2020-09-09

## 2020-07-29 MED ORDER — PREDNISONE 10 MG PO TABS: ORAL_TABLET | ORAL | 0 refills | Status: DC

## 2020-07-29 MED ORDER — PREDNISONE 10 MG PO TABS: 10.0000 mg | ORAL_TABLET | Freq: Every day | ORAL | 0 refills | Status: AC

## 2020-07-29 MED ORDER — PREDNISONE 10 MG PO TABS
ORAL_TABLET | ORAL | 0 refills | Status: DC
Start: 2020-07-29 — End: 2020-07-29

## 2020-07-29 MED ORDER — AZELASTINE HCL 0.1 % NA SOLN
1.0000 | Freq: Two times a day (BID) | NASAL | 2 refills | Status: DC
Start: 2020-07-29 — End: 2020-09-09

## 2020-07-29 NOTE — Progress Notes (Signed)
[place] [facility] [place] [facility] [place] [facility] [place] [facility] [place]   Follow-up Note  Referring Provider: Geoffry Paradise, MD Primary Provider: Geoffry Paradise, MD Date of Office Visit: 07/29/2020  Subjective:   Daniel [name] (DOB: [birth year]) is a 87 y.o. male who [facility] in re-evaluation of the following:  HPI: Daniel [name] returns to this clinic in evaluation of allergic rhinoconjunctivitis and a history of chronic sinusitis.  His last visit to this clinic was 10 July 2019.  He contacted me by telephone on 25 June 2019 with symptoms of a sinus infection and we treated him with clindamycin 150 mg 3 times per day for 20 days and he has improved significantly and has had most of the ugly discharge eliminated but he has noticed that he has had a little bit of bloody discharge that has been occurring in his postnasal drip.  He does not have any nasal bloody discharge.  Prior to this episode he had really done well for the past year and continues to use a nasal steroid and a leukotriene modifier on a consistent basis.  He has received 3 Moderna Covid vaccines and a flu vaccine this year.  He has had right total knee replacement on 07 April 2020 with a pretty good result.  Allergies as of 07/29/2020      Reactions   Penicillins Swelling, Other (See Comments)   Swelling and redness in joints Has patient had a PCN reaction causing immediate rash, facial/tongue/throat swelling, SOB or lightheadedness with hypotension: No Has patient had a PCN reaction causing severe rash involving mucus membranes or skin necrosis: No Has patient had a PCN reaction that required hospitalization: No Has patient had a PCN reaction occurring within the last 10 years: No If all of the above answers are "NO", then may proceed with Cephalosporin use.   Sulfa Antibiotics Other (See Comments)   Pain in left side underneath ribcage-pancreas   Statins Other (See Comments)    UNSPECIFIED REACTION  "INTOLERANCE AT HIGH DOSES"      Medication List      acetaminophen 500 MG tablet Commonly known as: TYLENOL Take 1,000 mg by mouth every 8 (eight) hours as needed for mild pain.   azelastine 0.1 % nasal spray Commonly known as: ASTELIN Place 1-2 sprays into both nostrils 2 (two) times daily.   budesonide 32 MCG/ACT nasal spray Commonly known as: RHINOCORT AQUA Place 1 spray into both nostrils daily.   cholecalciferol 1000 units tablet Commonly known as: VITAMIN D Take 1,000 Units by mouth daily.   clopidogrel 75 MG tablet Commonly known as: PLAVIX TAKE 1 TABLET(75 MG) BY MOUTH DAILY   docusate sodium 100 MG capsule Commonly known as: COLACE Take 200-300 mg by mouth daily as needed for mild constipation or moderate constipation.   isosorbide mononitrate 30 MG 24 hr tablet Commonly known as: IMDUR Take 30 mg by mouth daily.   lisinopril 5 MG tablet Commonly known as: ZESTRIL TAKE 1 TABLET(5 MG) BY MOUTH DAILY   methocarbamol 500 MG tablet Commonly known as: ROBAXIN Take 1-2 tablets (500-1,000 mg total) by mouth 4 (four) times daily.   metoprolol succinate 50 MG 24 hr tablet Commonly known as: TOPROL-XL Take 1and 1/2 tablets (75 mg)  in the morning and take 1 tablet ( 50 mg) in evening.   montelukast 10 MG tablet Commonly known as: SINGULAIR Take 10 mg by mouth at bedtime.   multivitamin with minerals Tabs tablet Take 1  tablet by mouth daily.   nitroGLYCERIN 0.4 MG SL tablet Commonly known as: NITROSTAT PLACE 1 TABLET UNDER THE TONGUE IF NEEDED EVERY 5 MINUTES FOR CHEST PAIN FOR 3 DOSES; IF NO RELIEF AFTER FIRST DOSE CALL PRESCRIBER OR 911.   oxyCODONE 5 MG immediate release tablet Commonly known as: Oxy IR/ROXICODONE Take 1-2 tablets (5-10 mg total) by mouth every 6 (six) hours as needed for severe pain.   tamsulosin 0.4 MG Caps capsule Commonly known as: FLOMAX Take 0.4 mg by mouth at bedtime.   vitamin B-12 1000 MCG  tablet Commonly known as: CYANOCOBALAMIN Take 1,000 mcg by mouth 2 (two) times daily.   vitamin C 250 MG tablet Commonly known as: ASCORBIC ACID Take 250 mg by mouth daily.   zinc gluconate 50 MG tablet Take 50 mg by mouth in the morning and at bedtime.   zolpidem 10 MG tablet Commonly known as: AMBIEN Take 10 mg by mouth at bedtime as needed for sleep.       Past Medical History:  Diagnosis Date  . Arthritis   . CAD of autologous arterial graft 2010   Atretic LIMA; Myoview Jan 2015: Possible mild basal anteroseptal defect thought to be artifact (CRO early LAD ischemia due to inadeuate retrograde perfusion via SVG-Diag  . CAD S/P percutaneous coronary angioplasty 11/2006   a. 6/'08: PCI nongrafted distal RCA-PDA: Mini vision BMS 2.25 mm x 28 mm; b. Relook Cath 08/2015: Stable CAD. No culprit lesion. Widely patent PDA stent. Patent SVG-OM 2, SVG-D2. Essentially atretic/small LIMA-LAD, but the LAD is perfused via SVG-D2  . Complication of anesthesia   . Coronary artery disease involving left main coronary artery [id number]   Last cath 2010: 70-80 % ostial left main, 80-90% LAD, subtotal CTO LCx-OM2, RCA patent w/ patent BMS stent (dRCA-PDA); SVG-diagonal backfills LAD, atretic LIMA-LAD -- medical therapy; stable by relook cath in March 2017  . Difficult intubation    VERY NARROW AIRWAY PER ANESTHESIOLOGIST (1997 CABG AT [facility])  . Dyslipidemia, goal LDL below 70   . Dysrhythmia   . Heart palpitations    Never fully diagnosed.  [name] History of kidney stones   . Hypertension   . Insomnia   . Mass in chest    JUST WATCHING , WILL CHECK OUT AFTER SURGERY  . Pneumonia   . S/P CABG x 3 1997   LIMA-LAD, SVG-D2, SVG-OM --> LIMA known to be atretic, but SVG-D2 fills diagonal and LAD  . Stenosis of cervical spine with myelopathy (HCC)   . Tingling    in fingers    Past Surgical History:  Procedure Laterality Date  . 48-HOUR MONITOR  03/2017   Relatively normal.  Very infrequent ectopy.   Short PAT run with occasional PACs/PVCs and bigeminy.  No symptoms noted on monitor.  Longest PAT runs were less than 10 beats.  [name] BACK SURGERY     decompression  lower back   . CARDIAC CATHETERIZATION N/A 08/08/2015   Procedure: Left Heart Cath and Cors/Grafts Angiography;  Surgeon: Marykay Lex, MD;  Location: [facility] INVASIVE CV LAB;; -->  LM 80%-70%. Ost LAD 80% then prox-mid 100% (after small D1). Small LCx & OM2 patent with 100% OM2. Prox & distal RCA 20%, patent BMS in distal RCA-rPDA.  Patent/atretic LIMA-dLAD, SVG-D2 (backdills entire dLAD) & SVG-OM2.   [name] COLONOSCOPY    . CORONARY ANGIOPLASTY WITH STENT PLACEMENT  Erskine Squibb 2008   BMS PCI of distal RCA-RPDA: PCI with a 2.25 mm x 28 mm Mini  Vision bare-metal stent.  . CORONARY ARTERY BYPASS GRAFT  [id number]   LIMA - LAD, SVG- diagonal, SVG-OM  . EYE SURGERY     BIL CATARACT  . KIDNEY STONE SURGERY    . KNEE ARTHROSCOPY     RT  KNEE  . LEFT HEART CATH AND CORS/GRAFTS ANGIOGRAPHY  11/2006   High-grade distal RCA and PDA stenosis --> BMS PCI.   [name]. LEFT HEART CATH AND CORS/GRAFTS ANGIOGRAPHY  2010   Native coronaries at 70% to 80% ostial left main. LAD had diffuse 80% to 90% stenosis  . NM MYOVIEW LTD  02/25/2004; Jan 2015   a) EF 57%; b)  Normal Nuclear Stress Test - No evidence of Ischemia or Infarction. Normal LV Function & wall motion.  [name]. NM MYOVIEW LTD  03/17/2017   No significant reversible ischemia. Increased TID ratio 1.34. LVEF 54% with normal wall motion. This is a low risk study.  [name]. POSTERIOR CERVICAL FUSION/FORAMINOTOMY N/A 07/14/2017   Procedure: LAMINECTOMY CERVICAL THREE- CERVICAL FOUR WITH LATERAL MASS FUSION;  Surgeon: Lisbeth RenshawNundkumar, Neelesh, MD;  Location: [facility] OR;  Service: Neurosurgery;  Laterality: N/A;  LAMINECTOMY CERVICAL 3- CERVICAL 4 WITH LATERAL MASS FUSION  . TOTAL KNEE ARTHROPLASTY Right 04/07/2020   Procedure: TOTAL KNEE ARTHROPLASTY;  Surgeon: Dannielle HuhLucey, Steve, MD;  Location: [facility] ORS;  Service: Orthopedics;  Laterality: Right;     Review of systems negative except as noted in HPI / PMHx or noted below:  Review of Systems  Constitutional: Negative.   HENT: Negative.   Eyes: Negative.   Respiratory: Negative.   Cardiovascular: Negative.   Gastrointestinal: Negative.   Genitourinary: Negative.   Musculoskeletal: Negative.   Skin: Negative.   Neurological: Negative.   Endo/Heme/Allergies: Negative.   Psychiatric/Behavioral: Negative.      Objective:   Vitals:   07/29/20 1426  BP: (!) 142/80  Pulse: 88  Resp: 14  Temp: 98.1 F (36.7 C)  SpO2: 98%   Height: 6\' 3"  (190.5 cm)  Weight: 186 lb (84.4 kg)   Physical Exam Constitutional:      Appearance: He is not diaphoretic.  HENT:     Head: Normocephalic.     Right Ear: Tympanic membrane, ear canal and external ear normal.     Left Ear: Tympanic membrane, ear canal and external ear normal.     Nose: Nose normal. No mucosal edema or rhinorrhea.     Mouth/Throat:     Mouth: Oropharynx is clear and moist and mucous membranes are normal.     Pharynx: Uvula midline. No oropharyngeal exudate.  Eyes:     Conjunctiva/sclera: Conjunctivae normal.  Neck:     Thyroid: No thyromegaly.     Trachea: Trachea normal. No tracheal tenderness or tracheal deviation.  Cardiovascular:     Rate and Rhythm: Normal rate and regular rhythm.     Heart sounds: Normal heart sounds, S1 normal and S2 normal. No murmur heard.   Pulmonary:     Effort: No respiratory distress.     Breath sounds: Normal breath sounds. No stridor. No wheezing or rales.  Musculoskeletal:        General: No edema.  Lymphadenopathy:     Head:     Right side of head: No tonsillar adenopathy.     Left side of head: No tonsillar adenopathy.     Cervical: No cervical adenopathy.  Skin:    Findings: No erythema or rash.     Nails: There is no clubbing.  Neurological:  Mental Status: He is alert.     Diagnostics:   Assessment and Plan:   1. Chronic pansinusitis   2. Other  allergic rhinitis     1. Continue Nasacort one spray each nostril once a day.  2. Continue Montelukast 10 mg one tablet once a day  3. Continue Azelastine 2 sprays each nostril 1-2 times per day  4. Can continue nasal saline washes  5.  For this prolonged upper airway issue use the following:   A.  Prednisone 10 mg - 1 tablet 1 time per day for 10 days only  6. If still with bloody discharge will need an ENT evaluation.   7. Return to clinic in 12 months or earlier if problem   Daniel [name] has recently completed 20 days of clindamycin for an episode of sinusitis and although he is improved he does have some bloody postnasal drip and we will now treat him with a steroid assuming that there is some significant inflammation of his upper airway.  If he still continues to have bloody discharge we will need to refer him onto ENT for a rhinoscopic exam.  He will continue to utilize a collection of anti-inflammatory agents for his airway including a nasal steroid and leukotriene modifier on a consistent basis.  If he does well with this plan I will see him back in this clinic in 1 year or earlier if there is a problem.  Laurette Schimke, MD Allergy / Immunology [facility]

## 2020-07-29 NOTE — Patient Instructions (Addendum)
  1. Continue Nasacort one spray each nostril once a day.  2. Continue Montelukast 10 mg one tablet once a day  3. Continue Azelastine 2 sprays each nostril 1-2 times per day  4. Can continue nasal saline washes  5.  For this prolonged upper airway issue use the following:   A.  Prednisone 10 mg - 1 tablet 1 time per day for 10 days only  6. If still with bloody discharge will need an ENT evaluation.   7. Return to clinic in 12 months or earlier if problem

## 2020-07-30 ENCOUNTER — Encounter: Payer: Self-pay | Admitting: Allergy and Immunology

## 2020-09-09 ENCOUNTER — Ambulatory Visit: Payer: Medicare Other | Admitting: Allergy and Immunology

## 2020-09-09 ENCOUNTER — Encounter: Payer: Self-pay | Admitting: Allergy and Immunology

## 2020-09-09 ENCOUNTER — Other Ambulatory Visit: Payer: Self-pay

## 2020-09-09 VITALS — BP 142/80 | HR 81 | Temp 98.1°F | Resp 18 | Ht 75.0 in | Wt 195.0 lb

## 2020-09-09 DIAGNOSIS — J324 Chronic pansinusitis: Secondary | ICD-10-CM

## 2020-09-09 DIAGNOSIS — J3089 Other allergic rhinitis: Secondary | ICD-10-CM

## 2020-09-09 MED ORDER — CLINDAMYCIN HCL 150 MG PO CAPS: 150.0000 mg | ORAL_CAPSULE | Freq: Two times a day (BID) | ORAL | 0 refills | Status: DC

## 2020-09-09 MED ORDER — MONTELUKAST SODIUM 10 MG PO TABS
10.0000 mg | ORAL_TABLET | Freq: Every day | ORAL | 5 refills | Status: DC
Start: 2020-09-09 — End: 2021-03-04

## 2020-09-09 MED ORDER — TRIAMCINOLONE ACETONIDE 55 MCG/ACT NA AERO: 1.0000 | INHALATION_SPRAY | Freq: Every day | NASAL | 5 refills | Status: AC

## 2020-09-09 MED ORDER — AZELASTINE HCL 0.1 % NA SOLN: 1.0000 | Freq: Two times a day (BID) | NASAL | 2 refills | Status: AC

## 2020-09-09 NOTE — Progress Notes (Signed)
[place] - [place] - [place] - [place] - [place]   Follow-up Note  Referring Provider: Geoffry Paradise, MD Primary Provider: Geoffry Paradise, MD Date of Office Visit: 09/09/2020  Subjective:   Daniel [name] (DOB: 1932/08/08) is a 87 y.o. male who [facility] in re-evaluation of the following:  HPI: Daniel [name] returns to this clinic in evaluation of allergic rhinoconjunctivitis and chronic sinusitis.  His last visit to this clinic was 29 July 2020.  He has utilized 2 different antibiotics over the course of the past several months in the treatment of his chronic sinus condition.  He still continues to have head congestion and head fullness and head pain and ugly nasal discharge.  He did better while using his antibiotic but relapsed after discontinuing this agent.  Allergies as of 09/09/2020      Reactions   Penicillins Swelling, Other (See Comments)   Swelling and redness in joints Has patient had a PCN reaction causing immediate rash, facial/tongue/throat swelling, SOB or lightheadedness with hypotension: No Has patient had a PCN reaction causing severe rash involving mucus membranes or skin necrosis: No Has patient had a PCN reaction that required hospitalization: No Has patient had a PCN reaction occurring within the last 10 years: No If all of the above answers are "NO", then may proceed with Cephalosporin use.   Sulfa Antibiotics Other (See Comments)   Pain in left side underneath ribcage-pancreas   Statins Other (See Comments)   UNSPECIFIED REACTION  "INTOLERANCE AT HIGH DOSES"      Medication List      acetaminophen 500 MG tablet Commonly known as: TYLENOL Take 1,000 mg by mouth every 8 (eight) hours as needed for mild pain.   azelastine 0.1 % nasal spray Commonly known as: ASTELIN Place 1-2 sprays into both nostrils 2 (two) times daily.   budesonide 32 MCG/ACT nasal spray Commonly known as: RHINOCORT  AQUA Place 1 spray into both nostrils daily.   cholecalciferol 1000 units tablet Commonly known as: VITAMIN D Take 1,000 Units by mouth daily.   clopidogrel 75 MG tablet Commonly known as: PLAVIX TAKE 1 TABLET(75 MG) BY MOUTH DAILY   docusate sodium 100 MG capsule Commonly known as: COLACE Take 200-300 mg by mouth daily as needed for mild constipation or moderate constipation.   isosorbide mononitrate 30 MG 24 hr tablet Commonly known as: IMDUR Take 30 mg by mouth daily.   lisinopril 5 MG tablet Commonly known as: ZESTRIL TAKE 1 TABLET(5 MG) BY MOUTH DAILY   methocarbamol 500 MG tablet Commonly known as: ROBAXIN Take 1-2 tablets (500-1,000 mg total) by mouth 4 (four) times daily.   metoprolol succinate 50 MG 24 hr tablet Commonly known as: TOPROL-XL Take 1and 1/2 tablets (75 mg)  in the morning and take 1 tablet ( 50 mg) in evening.   montelukast 10 MG tablet Commonly known as: SINGULAIR Take 1 tablet (10 mg total) by mouth at bedtime.   multivitamin with minerals Tabs tablet Take 1 tablet by mouth daily.   nitroGLYCERIN 0.4 MG SL tablet Commonly known as: NITROSTAT PLACE 1 TABLET UNDER THE TONGUE IF NEEDED EVERY 5 MINUTES FOR CHEST PAIN FOR 3 DOSES; IF NO RELIEF AFTER FIRST DOSE CALL PRESCRIBER OR 911   oxyCODONE 5 MG immediate release tablet Commonly known as: Oxy IR/ROXICODONE Take 1-2 tablets (5-10 mg total) by mouth every 6 (six) hours as needed for severe pain.   tamsulosin 0.4 MG Caps capsule Commonly known  as: FLOMAX Take 0.4 mg by mouth at bedtime.   vitamin B-12 1000 MCG tablet Commonly known as: CYANOCOBALAMIN Take 1,000 mcg by mouth 2 (two) times daily.   vitamin C 250 MG tablet Commonly known as: ASCORBIC ACID Take 250 mg by mouth daily.   zinc gluconate 50 MG tablet Take 50 mg by mouth in the morning and at bedtime.   zolpidem 10 MG tablet Commonly known as: AMBIEN Take 10 mg by mouth at bedtime as needed for sleep.       Past Medical  History:  Diagnosis Date  . Arthritis   . CAD of autologous arterial graft 2010   Atretic LIMA; Myoview Jan 2015: Possible mild basal anteroseptal defect thought to be artifact (CRO early LAD ischemia due to inadeuate retrograde perfusion via SVG-Diag  . CAD S/P percutaneous coronary angioplasty 11/2006   a. 6/'08: PCI nongrafted distal RCA-PDA: Mini vision BMS 2.25 mm x 28 mm; b. Relook Cath 08/2015: Stable CAD. No culprit lesion. Widely patent PDA stent. Patent SVG-OM 2, SVG-D2. Essentially atretic/small LIMA-LAD, but the LAD is perfused via SVG-D2  . Complication of anesthesia   . Coronary artery disease involving left main coronary artery [id number]   Last cath 2010: 70-80 % ostial left main, 80-90% LAD, subtotal CTO LCx-OM2, RCA patent w/ patent BMS stent (dRCA-PDA); SVG-diagonal backfills LAD, atretic LIMA-LAD -- medical therapy; stable by relook cath in March 2017  . Difficult intubation    VERY NARROW AIRWAY PER ANESTHESIOLOGIST (1997 CABG AT [facility])  . Dyslipidemia, goal LDL below 70   . Dysrhythmia   . Heart palpitations    Never fully diagnosed.  [name] History of kidney stones   . Hypertension   . Insomnia   . Mass in chest    JUST WATCHING , WILL CHECK OUT AFTER SURGERY  . Pneumonia   . S/P CABG x 3 1997   LIMA-LAD, SVG-D2, SVG-OM --> LIMA known to be atretic, but SVG-D2 fills diagonal and LAD  . Stenosis of cervical spine with myelopathy (HCC)   . Tingling    in fingers    Past Surgical History:  Procedure Laterality Date  . 48-HOUR MONITOR  03/2017   Relatively normal.  Very infrequent ectopy.  Short PAT run with occasional PACs/PVCs and bigeminy.  No symptoms noted on monitor.  Longest PAT runs were less than 10 beats.  [name] BACK SURGERY     decompression  lower back   . CARDIAC CATHETERIZATION N/A 08/08/2015   Procedure: Left Heart Cath and Cors/Grafts Angiography;  Surgeon: Marykay Lex, MD;  Location: [facility] INVASIVE CV LAB;; -->  LM 80%-70%. Ost LAD 80% then prox-mid 100%  (after small D1). Small LCx & OM2 patent with 100% OM2. Prox & distal RCA 20%, patent BMS in distal RCA-rPDA.  Patent/atretic LIMA-dLAD, SVG-D2 (backdills entire dLAD) & SVG-OM2.   [name] COLONOSCOPY    . CORONARY ANGIOPLASTY WITH STENT PLACEMENT  Erskine Squibb 2008   BMS PCI of distal RCA-RPDA: PCI with a 2.25 mm x 28 mm Mini Vision bare-metal stent.  . CORONARY ARTERY BYPASS GRAFT  [id number]   LIMA - LAD, SVG- diagonal, SVG-OM  . EYE SURGERY     BIL CATARACT  . KIDNEY STONE SURGERY    . KNEE ARTHROSCOPY     RT  KNEE  . LEFT HEART CATH AND CORS/GRAFTS ANGIOGRAPHY  11/2006   High-grade distal RCA and PDA stenosis --> BMS PCI.   [name] LEFT HEART CATH AND CORS/GRAFTS ANGIOGRAPHY  2010  Native coronaries at 70% to 80% ostial left main. LAD had diffuse 80% to 90% stenosis  . NM MYOVIEW LTD  02/25/2004; Jan 2015   a) EF 57%; b)  Normal Nuclear Stress Test - No evidence of Ischemia or Infarction. Normal LV Function & wall motion.  [name] NM MYOVIEW LTD  03/17/2017   No significant reversible ischemia. Increased TID ratio 1.34. LVEF 54% with normal wall motion. This is a low risk study.  [name] POSTERIOR CERVICAL FUSION/FORAMINOTOMY N/A 07/14/2017   Procedure: LAMINECTOMY CERVICAL THREE- CERVICAL FOUR WITH LATERAL MASS FUSION;  Surgeon: Lisbeth Renshaw, MD;  Location: [facility] OR;  Service: Neurosurgery;  Laterality: N/A;  LAMINECTOMY CERVICAL 3- CERVICAL 4 WITH LATERAL MASS FUSION  . TOTAL KNEE ARTHROPLASTY Right 04/07/2020   Procedure: TOTAL KNEE ARTHROPLASTY;  Surgeon: Dannielle Huh, MD;  Location: [facility] ORS;  Service: Orthopedics;  Laterality: Right;    Review of systems negative except as noted in HPI / PMHx or noted below:  Review of Systems  Constitutional: Negative.   HENT: Negative.   Eyes: Negative.   Respiratory: Negative.   Cardiovascular: Negative.   Gastrointestinal: Negative.   Genitourinary: Negative.   Musculoskeletal: Negative.   Skin: Negative.   Neurological: Negative.   Endo/Heme/Allergies: Negative.    Psychiatric/Behavioral: Negative.      Objective:   Vitals:   09/09/20 1558  BP: (!) 142/80  Pulse: 81  Resp: 18  Temp: 98.1 F (36.7 C)  SpO2: 99%   Height: 6\' 3"  (190.5 cm)  Weight: 195 lb (88.5 kg)   Physical Exam Constitutional:      Appearance: He is not diaphoretic.  HENT:     Head: Normocephalic.     Right Ear: Ear canal and external ear normal.     Left Ear: Ear canal and external ear normal.     Nose: Nose normal. No mucosal edema or rhinorrhea.     Mouth/Throat:     Pharynx: Uvula midline. No oropharyngeal exudate.  Eyes:     Conjunctiva/sclera: Conjunctivae normal.  Neck:     Thyroid: No thyromegaly.     Trachea: Trachea normal. No tracheal tenderness or tracheal deviation.  Lymphadenopathy:     Head:     Right side of head: No tonsillar adenopathy.     Left side of head: No tonsillar adenopathy.     Cervical: No cervical adenopathy.  Neurological:     Mental Status: He is alert.     Diagnostics: none  Assessment and Plan:   1. Chronic pansinusitis   2. Other allergic rhinitis     1. Continue Nasacort one spray each nostril once a day.  2. Continue Montelukast 10 mg one tablet once a day  3. Continue Azelastine 2 sprays each nostril 1-2 times per day  4. Can continue nasal saline washes  5.  For this prolonged upper airway issue use the following:   A.  Clindamycin 150 -1 tablet twice a day for 3 weeks  6.  Obtain a sinus CT scan for chronic sinusitis  7.  Further evaluation and treatment?  will start a prolonged course of broad-spectrum antibiotic with clindamycin and we will image his upper airway to define the extent of his sinus disease.  I will contact him once I have the results of his CT scan available for review.  Daniel Ashing, MD Allergy / Immunology [facility]

## 2020-09-09 NOTE — Patient Instructions (Signed)
  1. Continue Nasacort one spray each nostril once a day.  2. Continue Montelukast 10 mg one tablet once a day  3. Continue Azelastine 2 sprays each nostril 1-2 times per day  4. Can continue nasal saline washes  5.  For this prolonged upper airway issue use the following:   A.  Clindamycin 150 -1 tablet twice a day for 3 weeks  6.  Obtain a sinus CT scan for chronic sinusitis  7.  Further evaluation and treatment?

## 2020-09-10 ENCOUNTER — Other Ambulatory Visit: Payer: Self-pay

## 2020-09-10 ENCOUNTER — Encounter: Payer: Self-pay | Admitting: Allergy and Immunology

## 2020-09-10 ENCOUNTER — Ambulatory Visit
Admission: RE | Admit: 2020-09-10 | Discharge: 2020-09-10 | Disposition: A | Discharge status: Home / Self Care | Payer: Medicare Other | Source: Ambulatory Visit | Attending: Allergy and Immunology | Admitting: Allergy and Immunology

## 2020-09-10 DIAGNOSIS — J3089 Other allergic rhinitis: Secondary | ICD-10-CM

## 2020-09-10 DIAGNOSIS — J324 Chronic pansinusitis: Secondary | ICD-10-CM

## 2020-09-10 NOTE — Addendum Note (Signed)
Addended by: Berna Bue on: 09/10/2020 01:53 PM   Modules accepted: Orders

## 2020-09-16 DIAGNOSIS — H34832 Tributary (branch) retinal vein occlusion, left eye, with macular edema: Secondary | ICD-10-CM | POA: Insufficient documentation

## 2020-10-08 ENCOUNTER — Encounter: Payer: Self-pay | Admitting: Cardiology

## 2020-10-08 ENCOUNTER — Other Ambulatory Visit: Payer: Self-pay

## 2020-10-08 ENCOUNTER — Ambulatory Visit: Payer: Medicare Other | Admitting: Cardiology

## 2020-10-08 VITALS — BP 122/58 | HR 67 | Ht 75.0 in | Wt 194.6 lb

## 2020-10-08 DIAGNOSIS — I25118 Atherosclerotic heart disease of native coronary artery with other forms of angina pectoris: Secondary | ICD-10-CM

## 2020-10-08 DIAGNOSIS — R002 Palpitations: Secondary | ICD-10-CM | POA: Diagnosis not present

## 2020-10-08 DIAGNOSIS — Z01810 Encounter for preprocedural cardiovascular examination: Secondary | ICD-10-CM

## 2020-10-08 DIAGNOSIS — Z951 Presence of aortocoronary bypass graft: Secondary | ICD-10-CM

## 2020-10-08 DIAGNOSIS — I951 Orthostatic hypotension: Secondary | ICD-10-CM

## 2020-10-08 DIAGNOSIS — I10 Essential (primary) hypertension: Secondary | ICD-10-CM

## 2020-10-08 DIAGNOSIS — E785 Hyperlipidemia, unspecified: Secondary | ICD-10-CM

## 2020-10-08 DIAGNOSIS — R5383 Other fatigue: Secondary | ICD-10-CM

## 2020-10-08 MED ORDER — METOPROLOL SUCCINATE ER 50 MG PO TB24
50.0000 mg | ORAL_TABLET | Freq: Two times a day (BID) | ORAL | 3 refills | Status: DC
Start: 2020-10-08 — End: 2021-09-28

## 2020-10-08 MED ORDER — LISINOPRIL 5 MG PO TABS
5.0000 mg | ORAL_TABLET | Freq: Two times a day (BID) | ORAL | 3 refills | Status: DC
Start: 2020-10-08 — End: 2021-04-27

## 2020-10-08 NOTE — Progress Notes (Signed)
Primary Care Provider: Geoffry Paradise, MD Cardiologist: Bryan Lemma, MD Electrophysiologist: None  Clinic Note: Chief Complaint  Patient presents with  . Follow-up    Delayed 36-month; also hospital follow-up from his knee surgery.  . Coronary Artery Disease    No angina.  . Pre-op Exam    Has upcoming ENT surgery possibly scheduled.    ===================================  ASSESSMENT/PLAN   Problem List Items Addressed This Visit    Preoperative cardiovascular examination    Pending ENT surgery.  Remains asymptomatic from a cardiac standpoint.  Despite having known CAD, he is has no active symptoms of angina or heart failure.  Please see detailed preop evaluation from October 2021 but basically with stable, nonactive CAD, he does not have heart failure, cerebrovascular disease, diabetes or renal insufficiency.  Also ENT surgery is low risk.  The revised cardiac risk index, he would be considered a low risk patient for low risk surgery.  Risk of events would be 1 to 4%.  Mitigated by being on standing dose of beta-blocker.  Recommendation :  proceed to the OR without further cardiovascular evaluation.  Low risk surgery with no active symptoms.  Continue current dose of beta-blocker, but will hold ACE inhibitor on the morning of surgery.  Is okay for him to hold Plavix 5 days preop for surgeries or procedures.       LM-CAD - atretic LIMA-LAD, patent SVG-D2-> fills LAD & SVG-OM, BMS PCI rPDA (Chronic)    Significant CAD with redo CABG.  Most recent cath was in 2017 and Myoview was in 2018.  At this point I think further ischemic evaluation will simply be made based on chest pain evaluation.  If no symptoms, would not proceed as do not think he would be excited about further invasive procedures.    Plan:   Continue ACE inhibitor and beta-blocker with dose adjustment as noted.  I think we can probably discontinue Imdur and see how he does.  If symptoms of chest pain  occur, would restart.  He is on maintenance Plavix which is as much for CAD as it is for his retinal vein occlusion. -->   okay to hold Plavix 5 days preop for surgeries or procedures.  He is not currently on statin or adding lipid-lowering agent and LDL is 109.  He would prefer to simply avoid taking additional medications and understands the risks.  We will not address further.        Relevant Medications   lisinopril (ZESTRIL) 5 MG tablet   metoprolol succinate (TOPROL-XL) 50 MG 24 hr tablet   Other Relevant Orders   EKG 12-Lead (Completed)   S/P CABG x 3 (Chronic)    Basically atretic LIMA with the LAD distribution being fed via SVG-diagonal and then the remainder the left system being fed by the SVG-OM.  RCA is patent with patent PDA stent based on months.  No anginal symptoms.  Last ischemic evaluation was in 2018 following cardiac catheterization in 2017.  Would technically be due for surveillance monitoring with a stress test, but he would prefer to avoid further testing unless he has symptoms.      Relevant Orders   EKG 12-Lead (Completed)   Essential hypertension - Primary (Chronic)    Blood pressures been doing fairly well.  He is actually taking his lisinopril 5 mg twice daily as well as once daily.  This point he avoids hypotension spells.   Plan: Reduce Toprol to 50 mg twice daily, and increase lisinopril to  5 mg twice daily.         Relevant Medications   lisinopril (ZESTRIL) 5 MG tablet   metoprolol succinate (TOPROL-XL) 50 MG 24 hr tablet   Dyslipidemia, goal LDL below 70 (Chronic)    Again, no longer taking statin.  Not interested in trying new medications--most notably does not want to do injections.  We talked by [facility] today he would like to get over all of his aches and pains so he would not confuse symptoms associated with any medication versus his aches and pains.  As such, we will simply monitor he has not been eating as much lately and may have  better labs.  He should be due for labs be checked later on this summer by his PCP.      Relevant Medications   lisinopril (ZESTRIL) 5 MG tablet   metoprolol succinate (TOPROL-XL) 50 MG 24 hr tablet   Orthostatic hypotension (Chronic)    We have backed off on some of his medications in the past.  He is doing fairly well.  I think we can make a couple of adjustments.  He actually says his blood pressures were going back up again he started taking lisinopril 5 twice a day.  This goes along with him taking different doses of metoprolol in the morning immediately.  With this regimen, he seems to doing fairly well.  He also has some occasional dizziness, which is why splitting the lisinopril to twice a day is beneficial to avoid major drops.    Plan: We can discontinue Imdur, increase lisinopril to 5 mg twice daily (with history of instructions to hold an appointment If He Were to Notice Any Lightheadedness Dizziness with Associated Hypotension)  I will also reduce his metoprolol to 50 mg twice daily to allow for more blood pressure room           Relevant Medications   lisinopril (ZESTRIL) 5 MG tablet   metoprolol succinate (TOPROL-XL) 50 MG 24 hr tablet   Fatigue due to treatment (Chronic)    I think his fatigue is probably related to deconditioning, but could be related to medications.  Since his palpitations are pretty well controlled plan we did reduce his beta-blocker 50 bid  from 75 mg tablet daily morning.  Hopefully by reducing beta-blocker dose, he will get some more energy level.  Hopefully the more he is able to do physical therapy, he will regain strength, mobility, and endurance.       Heart palpitations (Chronic)    Pretty well trolled.  Has not really had any significant episodes.  I think he reduce beta-blocker safely.      Relevant Orders   EKG 12-Lead (Completed)      ===================================  HPI:    JERAMINE DELIS is a 87 y.o. male with a  longstanding history of CAD with associated risk factors who presents today for delayed 64-month follow-up.  He is a former patient Dr. Julieanne Manson with a distant history of CAD  1997: Multivessel CAD-CABG for LM CAD -- evaluation was for exertional angina.   June 2008: PTCA of rPDA for what sounded like acute CHF Sx - dyspnea, no angina. Last Cath 08/2015 -LIMA but the LAD fills via the SVG-D2.SVG-OM & rPDA stent patent. ->Image reviewed below  He also has labile hypertension with some orthostatic hypotension. Has recently been taken off of his ACE inhibitor and we reduced his p.m. dose of beta-blocker.  Recent Dx of R RetinalVeinocclusion - recommended continued Plavix  Ulysees Robarts Lollis was last seen on March 17, 2020 for preoperative evaluation for right knee arthroplasty.  He was doing fairly well to milligrams with being a widower (his wife passed away in 07-21-2019).  He was doing well overall from a cardiac standpoint.  Really limited by his knee pain.  He had been putting off the surgery because of taking care of his wife, but now that she has passed she was he was ready to move forward.  Able to do at least 6-8 METS even with his knee hurting until the last month or so.  20 activity.  No chest pain or pressure.  Intermittent skipped beats but nothing prolonged.  Stable exertional dyspnea.  Deconditioning now.  Relatively low risk for low risk surgery. => Recommended proceeding to surgery from evaluation  Recent Hospitalizations:   04/07/2020: Right knee total knee arthroplasty  Reviewed  CV studies:    The following studies were reviewed today: (if available, images/films reviewed: From Epic Chart or Care Everywhere) . None:  Interval History:   SEDDRICK FLAX returns here today for his first follow-up since his knee surgery.  He says the right knee is definitely feeling better, just not fully healed.  Left knee now hurts.  He also has back discomfort and  spasms.  He is now working with physical therapy since his surgery and he says when he is doing that he feels okay but he just has a lot of osteoarthritis type pains.  He is clearly deconditioned compared to what his baseline was, he was on quite a while with right knee pain basically being bone-on-bone and limiting any walking.  He is getting back into his exercise level but not anywhere near where he would have been.  Also been troubled by left knee and back pain.  He maybe gets a little short of breath because of deconditioning, but has not had any chest pain or pressure at rest or exertion.  No rapid rate heartbeats palpitations.  No PND, orthopnea or edema.  No syncope/near syncope or TIA/amaurosis fugax.  No claudication.    He is not sure why and if he would do his left knee surgery, but is also pending some type of sinus surgery in the future.   The patient does not have symptoms concerning for COVID-19 infection (fever, chills, cough, or new shortness of breath).   REVIEWED OF SYSTEMS   Review of Systems  Constitutional: Positive for malaise/fatigue (Energy level is still down.  Gradually starting to feel stronger and getting into more activity.  He says it is taking longer than expected to recover.  He gets tired after doing about 2 hours with activity and then has the time to rest --normal).  HENT: Negative for congestion and nosebleeds.   Respiratory: Negative for shortness of breath.   Cardiovascular: Positive for leg swelling (Trivial).  Gastrointestinal: Negative for abdominal pain, blood in stool and melena.  Genitourinary: Negative for frequency.  Musculoskeletal: Positive for back pain (Currently doing physical therapy) and joint pain (Right knee feels better.  Not fully healed yet.  Now the left knee hurts.).  Neurological: Positive for dizziness (Some spinning type sensation.  Some mild orthostatic symptoms..  Also unsteady gait because of now left-sided knee discomfort) and  focal weakness (Right leg is still a little weak.  However now the left leg is giving out on him because of pain.). Negative for weakness.  Psychiatric/Behavioral: Positive for memory loss (Maybe some mild memory loss.). Negative for  depression (Coping well being 1 year out from wife's death.). The patient is not nervous/anxious and does not have insomnia.     I have reviewed and (if needed) personally updated the patient's problem list, medications, allergies, past medical and surgical history, social and family history.   PAST MEDICAL HISTORY   Past Medical History:  Diagnosis Date  . Arthritis   . CAD of autologous arterial graft 2010   Atretic LIMA; Myoview Jan 2015: Possible mild basal anteroseptal defect thought to be artifact (CRO early LAD ischemia due to inadeuate retrograde perfusion via SVG-Diag  . CAD S/P percutaneous coronary angioplasty 11/2006   a. 6/'08: PCI nongrafted distal RCA-PDA: Mini vision BMS 2.25 mm x 28 mm; b. Relook Cath 08/2015: Stable CAD. No culprit lesion. Widely patent PDA stent. Patent SVG-OM 2, SVG-D2. Essentially atretic/small LIMA-LAD, but the LAD is perfused via SVG-D2  . Complication of anesthesia   . Coronary artery disease involving left main coronary artery [id number]   Last cath 2010: 70-80 % ostial left main, 80-90% LAD, subtotal CTO LCx-OM2, RCA patent w/ patent BMS stent (dRCA-PDA); SVG-diagonal backfills LAD, atretic LIMA-LAD -- medical therapy; stable by relook cath in March 2017  . Difficult intubation    VERY NARROW AIRWAY PER ANESTHESIOLOGIST (1997 CABG AT [facility])  . Dyslipidemia, goal LDL below 70   . Dysrhythmia   . Heart palpitations    Never fully diagnosed.  [name] History of kidney stones   . Hypertension   . Insomnia   . Mass in chest    JUST WATCHING , WILL CHECK OUT AFTER SURGERY  . Pneumonia   . S/P CABG x 3 1997   LIMA-LAD, SVG-D2, SVG-OM --> LIMA known to be atretic, but SVG-D2 fills diagonal and LAD  . Stenosis of cervical spine with  myelopathy (HCC)   . Tingling    in fingers    PAST SURGICAL HISTORY   Past Surgical History:  Procedure Laterality Date  . 48-HOUR MONITOR  03/2017   Relatively normal.  Very infrequent ectopy.  Short PAT run with occasional PACs/PVCs and bigeminy.  No symptoms noted on monitor.  Longest PAT runs were less than 10 beats.  [name] BACK SURGERY     decompression  lower back   . CARDIAC CATHETERIZATION N/A 08/08/2015   Procedure: Left Heart Cath and Cors/Grafts Angiography;  Surgeon: Marykay Lex, MD;  Location: [facility] INVASIVE CV LAB;; -->  LM 80%-70%. Ost LAD 80% then prox-mid 100% (after small D1). Small LCx & OM2 patent with 100% OM2. Prox & distal RCA 20%, patent BMS in distal RCA-rPDA.  Patent/atretic LIMA-dLAD, SVG-D2 (backdills entire dLAD) & SVG-OM2.   [name] COLONOSCOPY    . CORONARY ANGIOPLASTY WITH STENT PLACEMENT  Erskine Squibb 2008   BMS PCI of distal RCA-RPDA: PCI with a 2.25 mm x 28 mm Mini Vision bare-metal stent.  . CORONARY ARTERY BYPASS GRAFT  [id number]   LIMA - LAD, SVG- diagonal, SVG-OM  . EYE SURGERY     BIL CATARACT  . KIDNEY STONE SURGERY    . KNEE ARTHROSCOPY     RT  KNEE  . LEFT HEART CATH AND CORS/GRAFTS ANGIOGRAPHY  11/2006   High-grade distal RCA and PDA stenosis --> BMS PCI.   [name] LEFT HEART CATH AND CORS/GRAFTS ANGIOGRAPHY  2010   Native coronaries at 70% to 80% ostial left main. LAD had diffuse 80% to 90% stenosis  . NM MYOVIEW LTD  02/25/2004; Jan 2015   a) EF 57%; b)  Normal Nuclear  Stress Test - No evidence of Ischemia or Infarction. Normal LV Function & wall motion.  [name]. NM MYOVIEW LTD  03/17/2017   No significant reversible ischemia. Increased TID ratio 1.34. LVEF 54% with normal wall motion. This is a low risk study.  [name]. POSTERIOR CERVICAL FUSION/FORAMINOTOMY N/A 07/14/2017   Procedure: LAMINECTOMY CERVICAL THREE- CERVICAL FOUR WITH LATERAL MASS FUSION;  Surgeon: Lisbeth RenshawNundkumar, Neelesh, MD;  Location: [facility] OR;  Service: Neurosurgery;  Laterality: N/A;  LAMINECTOMY CERVICAL 3-  CERVICAL 4 WITH LATERAL MASS FUSION  . TOTAL KNEE ARTHROPLASTY Right 04/07/2020   Procedure: TOTAL KNEE ARTHROPLASTY;  Surgeon: Dannielle HuhLucey, Steve, MD;  Location: [facility] ORS;  Service: Orthopedics;  Laterality: Right;   Cath 08/2015: Stable from 2010     There is no immunization history on file for this patient.  MEDICATIONS/ALLERGIES   Current Meds  Medication Sig  . acetaminophen (TYLENOL) 500 MG tablet Take 1,000 mg by mouth every 8 (eight) hours as needed for mild pain.  [name]. azelastine (ASTELIN) 0.1 % nasal spray Place 1-2 sprays into both nostrils 2 (two) times daily.  . budesonide (RHINOCORT AQUA) 32 MCG/ACT nasal spray Place 1 spray into both nostrils daily.   . cholecalciferol (VITAMIN D) 1000 units tablet Take 1,000 Units by mouth daily.  . clopidogrel (PLAVIX) 75 MG tablet TAKE 1 TABLET(75 MG) BY MOUTH DAILY  . docusate sodium (COLACE) 100 MG capsule Take 200-300 mg by mouth daily as needed for mild constipation or moderate constipation.  . methocarbamol (ROBAXIN) 500 MG tablet Take 1-2 tablets (500-1,000 mg total) by mouth 4 (four) times daily.  . montelukast (SINGULAIR) 10 MG tablet Take 1 tablet (10 mg total) by mouth at bedtime.  . Multiple Vitamin (MULTIVITAMIN WITH MINERALS) TABS tablet Take 1 tablet by mouth daily.  . nitroGLYCERIN (NITROSTAT) 0.4 MG SL tablet PLACE 1 TABLET UNDER THE TONGUE IF NEEDED EVERY 5 MINUTES FOR CHEST PAIN FOR 3 DOSES; IF NO RELIEF AFTER FIRST DOSE CALL PRESCRIBER OR 911.  . tamsulosin (FLOMAX) 0.4 MG CAPS capsule Take 0.4 mg by mouth at bedtime.   . triamcinolone (NASACORT) 55 MCG/ACT AERO nasal inhaler Place 1 spray into the nose daily.  . vitamin B-12 (CYANOCOBALAMIN) 1000 MCG tablet Take 1,000 mcg by mouth 2 (two) times daily.   . vitamin C (ASCORBIC ACID) 250 MG tablet Take 250 mg by mouth daily.  [name]. zinc gluconate 50 MG tablet Take 50 mg by mouth in the morning and at bedtime.  [name]. zolpidem (AMBIEN) 10 MG tablet Take 10 mg by mouth at bedtime as needed  for sleep.  . [DISCONTINUED] isosorbide mononitrate (IMDUR) 30 MG 24 hr tablet Take 30 mg by mouth daily.  . [DISCONTINUED] lisinopril (ZESTRIL) 5 MG tablet TAKE 1 TABLET(5 MG) BY MOUTH DAILY  . [DISCONTINUED] metoprolol succinate (TOPROL-XL) 50 MG 24 hr tablet Take 1and 1/2 tablets (75 mg)  in the morning and take 1 tablet ( 50 mg) in evening. (Patient taking differently: No sig reported)  . [DISCONTINUED] metoprolol succinate (TOPROL-XL) 50 MG 24 hr tablet Take 50 mg by mouth daily. Take 1 and 1/2 tablets (75 mg) in the morning and take 1 tablet (50 mg) in evening.    Allergies  Allergen Reactions  . Penicillins Swelling and Other (See Comments)    Swelling and redness in joints  Has patient had a PCN reaction causing immediate rash, facial/tongue/throat swelling, SOB or lightheadedness with hypotension: No Has patient had a PCN reaction causing severe rash involving mucus membranes or skin necrosis:  No Has patient had a PCN reaction that required hospitalization: No Has patient had a PCN reaction occurring within the last 10 years: No If all of the above answers are "NO", then may proceed with Cephalosporin use.   . Sulfa Antibiotics Other (See Comments)    Pain in left side underneath ribcage-pancreas  . Statins Other (See Comments)    UNSPECIFIED REACTION  "INTOLERANCE AT HIGH DOSES"    SOCIAL HISTORY/FAMILY HISTORY   Reviewed in Epic:  Pertinent findings:  Social History   Tobacco Use  . Smoking status: Never Smoker  . Smokeless tobacco: Never Used  Vaping Use  . Vaping Use: Never used  Substance Use Topics  . Alcohol use: No  . Drug use: No   Social History   Social History Narrative   He is a widowed father of 2, grandfather 5. He is retired from [employer], Psychologist, educational.    His wife Cordelia Pen died in 07/18/2018  He now lives alone in a one story home.   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  He is usually very active, but is limited as far as standing exercise by his knee osteoarthritis.   He does not drink and does not smoke. Never smoked.   Education: college.     OBJCTIVE -PE, EKG, labs   Wt Readings from Last 3 Encounters:  10/08/20 194 lb 9.6 oz (88.3 kg)  09/09/20 195 lb (88.5 kg)  07/29/20 186 lb (84.4 kg)    Physical Exam: BP (!) 122/58 (BP Location: Left Arm, Patient Position: Sitting, Cuff Size: Normal)   Pulse 67   Ht 6\' 3"  (1.905 m)   Wt 194 lb 9.6 oz (88.3 kg)   BMI 24.32 kg/m  Physical Exam Vitals reviewed.  Constitutional:      General: He is not in acute distress.    Appearance: Normal appearance. He is normal weight. He is not ill-appearing or toxic-appearing.     Comments: Healthy-appearing.  Well-groomed.  Quite tall.  He has put on some weight and appears more healthy.  HENT:     Head: Normocephalic.  Neck:     Vascular: No carotid bruit, hepatojugular reflux or JVD.  Cardiovascular:     Rate and Rhythm: Normal rate and regular rhythm.  No extrasystoles are present.    Chest Wall: PMI is not displaced.     Pulses: Normal pulses.     Heart sounds: Normal heart sounds. No murmur heard. No friction rub. No gallop.   Pulmonary:     Effort: Pulmonary effort is normal. No respiratory distress.     Breath sounds: Normal breath sounds.  Chest:     Chest wall: No tenderness.  Musculoskeletal:        General: No swelling.     Cervical back: Normal range of motion.  Skin:    General: Skin is warm and dry.  Neurological:     General: No focal deficit present.     Mental Status: He is alert and oriented to person, place, and time.  Psychiatric:        Mood and Affect: Mood normal.        Behavior: Behavior normal.        Thought Content: Thought content normal.        Judgment: Judgment normal.       Adult ECG Report  Rate: 67 ;  Rhythm: normal sinus rhythm and  Normal axis, intervals and durations.;   Narrative Interpretation:  Stable  Recent Labs: No new labs since October 2021.  Labs reviewed.  Lab Results  Component Value Date   CREATININE 0.92 04/03/2020   BUN 25 (H) 04/03/2020   NA 144 04/03/2020   K 4.5 04/03/2020   CL 105 04/03/2020   CO2 28 04/03/2020   CBC Latest Ref Rng & Units 04/03/2020 08/05/2017 07/12/2017  WBC 4.0 - 10.5 K/uL 7.8 5.5 6.1  Hemoglobin 13.0 - 17.0 g/dL 16.1 09.6 04.5  Hematocrit 39.0 - 52.0 % 43.1 42.3 43.3  Platelets 150 - 400 K/uL 254 291 249    Lab Results  Component Value Date   TSH 0.20 (L) 05/02/2017    ==================================================  COVID-19 Education: The signs and symptoms of COVID-19 were discussed with the patient and how to seek care for testing (follow up with PCP or arrange E-visit).   The importance of social distancing and COVID-19 vaccination was discussed today. The patient is practicing social distancing & Masking.   I spent a total of with the patient spent in direct patient consultation.  We discussed again his risks of potential adverse cardiac events, but feel that based on lack of active CAD symptoms of angina or CHF, he would be a low risk patient for low risk surgery Additional time spent with chart review  / charting (studies, outside notes, etc): -> Medical decision making with upcoming potential surgery with preoperative assessment along with medication adjustments.  Also reviewed his outpatient records following his surgery. Total Time: 43 min  Current medicines are reviewed at length with the patient today.  (+/- concerns) n/a  This visit occurred during the SARS-CoV-2 public health emergency.  Safety protocols were in place, including screening questions prior to the visit, additional usage of staff PPE, and extensive cleaning of exam room while observing appropriate contact time as indicated for disinfecting solutions.  Notice: This dictation was prepared with Dragon dictation along with smaller phrase  technology. Any transcriptional errors that result from this process are unintentional and may not be corrected upon review.  Patient Instructions / Medication Changes & Studies & Tests Ordered   Patient Instructions  Medication Instructions:   stop taking Isosorbide mon. ( Imdur)    increase Lisinopril to 5 mg twice a day ( you can hold a dose if you light head ar little dizzy prior to that dose for the day .   Change dose Metoprolol succinate to 50 mg twice a day ( one tablet twice aday )    *If you need a refill on your cardiac medications before your next appointment, please call your pharmacy*   Lab Work:  Not needed   Testing/Procedures:  Not needed  Follow-Up: At [facility], you and your health needs are our priority.  As part of our continuing mission to provide you with exceptional heart care, we have created designated Provider Care Teams.  These Care Teams include your primary Cardiologist (physician) and Advanced Practice Providers (APPs -  Physician Assistants and Nurse Practitioners) who all work together to provide you with the care you need, when you need it.     Your next appointment:   6 month(s)  The format for your next appointment:   In Person  Provider:   Bryan Lemma, MD        Studies Ordered:   Orders Placed This Encounter  Procedures  . EKG 12-Lead     Bryan Lemma, M.D., M.S. Interventional  Cardiologist   Pager # [phone] Phone # [phone] [address] [place], [place] [address]   Thank you for choosing Heartcare at [facility]!!

## 2020-10-08 NOTE — Patient Instructions (Signed)
Medication Instructions:   stop taking Isosorbide mon. ( Imdur)    increase Lisinopril to 5 mg twice a day ( you can hold a dose if you light head ar little dizzy prior to that dose for the day .   Change dose Metoprolol succinate to 50 mg twice a day ( one tablet twice aday )    *If you need a refill on your cardiac medications before your next appointment, please call your pharmacy*   Lab Work:  Not needed   Testing/Procedures:  Not needed  Follow-Up: At [facility], you and your health needs are our priority.  As part of our continuing mission to provide you with exceptional heart care, we have created designated Provider Care Teams.  These Care Teams include your primary Cardiologist (physician) and Advanced Practice Providers (APPs -  Physician Assistants and Nurse Practitioners) who all work together to provide you with the care you need, when you need it.     Your next appointment:   6 month(s)  The format for your next appointment:   In Person  Provider:   Bryan Lemma, MD

## 2020-10-10 ENCOUNTER — Other Ambulatory Visit: Payer: Self-pay

## 2020-10-10 ENCOUNTER — Ambulatory Visit (INDEPENDENT_AMBULATORY_CARE_PROVIDER_SITE_OTHER): Payer: Medicare Other | Admitting: Otolaryngology

## 2020-10-10 ENCOUNTER — Encounter (INDEPENDENT_AMBULATORY_CARE_PROVIDER_SITE_OTHER): Payer: Self-pay | Admitting: Otolaryngology

## 2020-10-10 VITALS — Temp 97.2°F

## 2020-10-10 DIAGNOSIS — J320 Chronic maxillary sinusitis: Secondary | ICD-10-CM

## 2020-10-10 DIAGNOSIS — J322 Chronic ethmoidal sinusitis: Secondary | ICD-10-CM

## 2020-10-10 DIAGNOSIS — J342 Deviated nasal septum: Secondary | ICD-10-CM

## 2020-10-10 NOTE — Progress Notes (Signed)
HPI: Daniel [name] is a 87 y.o. male who presents is referred by Dr. Laurette SchimkeEric Kozlow for evaluation of chronic left-sided sinusitis noted on recent CT scan of the sinuses.. Patient states that he has had chronic sinus problems for a number of years.  He generally takes clindamycin once or twice every year because of sinus infections and this seems to alleviate it.  He has been on 3 rounds of antibiotics since the end of December because of chronic yellow-green discharge.  But as soon as he stops antibiotics the infection comes back.  He recently had a CT scan performed a month ago and I reviewed this.  This showed complete opacification of the left anterior ethmoid region and left maxillary sinus.  He has some calcifications within the left maxillary sinus consistent with possible chronic fungal sinusitis.  He has some widening of the left maxillary ostia consistent with obstructing lesion.  He has chronic thickening of the bone around the maxillary sinus on the left side indicating chronicity of left maxillary infection.  He has partial opacification of the left frontal sinus secondary to the obstruction of the nasal frontal drainage system.  Remaining sinuses are essentially clear. Patient uses regularly azelastine as well as nasal steroid spray either Rhinocort or triamcinolone.  He also takes Singulair and Claritin for allergies. He states that he has an allergy to penicillin as well as sulfa medication. Most recently he has been on clindamycin for several weeks. He has some history of chronic cardiac disease and has had previous bypass surgery as well as stents placed last time being about 8 years ago.  He is followed by Dr. Bryan Lemmaavid Harding.  He is on Plavix.  Past Medical History:  Diagnosis Date  . Arthritis   . CAD of autologous arterial graft 2010   Atretic LIMA; Myoview Jan 2015: Possible mild basal anteroseptal defect thought to be artifact (CRO early LAD ischemia due to inadeuate retrograde  perfusion via SVG-Diag  . CAD S/P percutaneous coronary angioplasty 11/2006   a. 6/'08: PCI nongrafted distal RCA-PDA: Mini vision BMS 2.25 mm x 28 mm; b. Relook Cath 08/2015: Stable CAD. No culprit lesion. Widely patent PDA stent. Patent SVG-OM 2, SVG-D2. Essentially atretic/small LIMA-LAD, but the LAD is perfused via SVG-D2  . Complication of anesthesia   . Coronary artery disease involving left main coronary artery [id number]   Last cath 2010: 70-80 % ostial left main, 80-90% LAD, subtotal CTO LCx-OM2, RCA patent w/ patent BMS stent (dRCA-PDA); SVG-diagonal backfills LAD, atretic LIMA-LAD -- medical therapy; stable by relook cath in March 2017  . Difficult intubation    VERY NARROW AIRWAY PER ANESTHESIOLOGIST ([facility])  . Dyslipidemia, goal LDL below 70   . Dysrhythmia   . Heart palpitations    Never fully diagnosed.  [name]. History of kidney stones   . Hypertension   . Insomnia   . Mass in chest    JUST WATCHING , WILL CHECK OUT AFTER SURGERY  . Pneumonia   . S/P CABG x 3 1997   LIMA-LAD, SVG-D2, SVG-OM --> LIMA known to be atretic, but SVG-D2 fills diagonal and LAD  . Stenosis of cervical spine with myelopathy (HCC)   . Tingling    in fingers   Past Surgical History:  Procedure Laterality Date  . 48-HOUR MONITOR  03/2017   Relatively normal.  Very infrequent ectopy.  Short PAT run with occasional PACs/PVCs and bigeminy.  No symptoms noted on monitor.  Longest PAT runs were  less than 10 beats.  [name] BACK SURGERY     decompression  lower back   . CARDIAC CATHETERIZATION N/A 08/08/2015   Procedure: Left Heart Cath and Cors/Grafts Angiography;  Surgeon: Marykay Lex, MD;  Location: [facility] INVASIVE CV LAB;; -->  LM 80%-70%. Ost LAD 80% then prox-mid 100% (after small D1). Small LCx & OM2 patent with 100% OM2. Prox & distal RCA 20%, patent BMS in distal RCA-rPDA.  Patent/atretic LIMA-dLAD, SVG-D2 (backdills entire dLAD) & SVG-OM2.   [name] COLONOSCOPY    . CORONARY ANGIOPLASTY WITH STENT  PLACEMENT  Erskine Squibb 2008   BMS PCI of distal RCA-RPDA: PCI with a 2.25 mm x 28 mm Mini Vision bare-metal stent.  . CORONARY ARTERY BYPASS GRAFT  [id number]   LIMA - LAD, SVG- diagonal, SVG-OM  . EYE SURGERY     BIL CATARACT  . KIDNEY STONE SURGERY    . KNEE ARTHROSCOPY     RT  KNEE  . LEFT HEART CATH AND CORS/GRAFTS ANGIOGRAPHY  11/2006   High-grade distal RCA and PDA stenosis --> BMS PCI.   [name] LEFT HEART CATH AND CORS/GRAFTS ANGIOGRAPHY  2010   Native coronaries at 70% to 80% ostial left main. LAD had diffuse 80% to 90% stenosis  . NM MYOVIEW LTD  02/25/2004; 07-10-2013  a) EF 57%; b)  Normal Nuclear Stress Test - No evidence of Ischemia or Infarction. Normal LV Function & wall motion.  [name] NM MYOVIEW LTD  03/17/2017   No significant reversible ischemia. Increased TID ratio 1.34. LVEF 54% with normal wall motion. This is a low risk study.  [name] POSTERIOR CERVICAL FUSION/FORAMINOTOMY N/A 07/14/2017   Procedure: LAMINECTOMY CERVICAL THREE- CERVICAL FOUR WITH LATERAL MASS FUSION;  Surgeon: Lisbeth Renshaw, MD;  Location: [facility] OR;  Service: Neurosurgery;  Laterality: N/A;  LAMINECTOMY CERVICAL 3- CERVICAL 4 WITH LATERAL MASS FUSION  . TOTAL KNEE ARTHROPLASTY Right 04/07/2020   Procedure: TOTAL KNEE ARTHROPLASTY;  Surgeon: Dannielle Huh, MD;  Location: [facility] ORS;  Service: Orthopedics;  Laterality: Right;   Social History   Socioeconomic History  . Marital status: Widowed    Spouse name: Cordelia Pen  . Number of children: 2  . Years of education: 6  . Highest education level: Not on file  Occupational History  . Occupation: retired Psychologist, educational  Tobacco Use  . Smoking status: Never Smoker  . Smokeless tobacco: Never Used  Vaping Use  . Vaping Use: Never used  Substance and Sexual Activity  . Alcohol use: No  . Drug use: No  . Sexual activity: Not on file  Other Topics Concern  . Not on file  Social History Narrative   He is a widowed father of 2, grandfather 5. He is retired from [employer], Psychologist, educational.     His wife Cordelia Pen died in 2018/07/10  He now lives alone in a one story home.   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  He is usually very active, but is limited as far as standing exercise by his knee osteoarthritis.   He does not drink and does not smoke. Never smoked.   Education: college.    Social Determinants of Health   Financial Resource Strain: Not on file  Food Insecurity: Not on file  Transportation Needs: Not on file  Physical Activity: Not on file  Stress: Not on file  Social Connections: Not on file   Family History  Problem Relation  Age of Onset  . Stroke Mother 31  . Heart disease Mother   . Arthritis Mother   . Heart attack Father 50  . Other Sister   . Heart disease Brother   . Allergic rhinitis Neg Hx   . Atopy Neg Hx   . Angioedema Neg Hx   . Asthma Neg Hx   . Eczema Neg Hx   . Immunodeficiency Neg Hx   . Urticaria Neg Hx    Allergies  Allergen Reactions  . Penicillins Swelling and Other (See Comments)    Swelling and redness in joints  Has patient had a PCN reaction causing immediate rash, facial/tongue/throat swelling, SOB or lightheadedness with hypotension: No Has patient had a PCN reaction causing severe rash involving mucus membranes or skin necrosis: No Has patient had a PCN reaction that required hospitalization: No Has patient had a PCN reaction occurring within the last 10 years: No If all of the above answers are "NO", then may proceed with Cephalosporin use.   . Sulfa Antibiotics Other (See Comments)    Pain in left side underneath ribcage-pancreas  . Statins Other (See Comments)    UNSPECIFIED REACTION  "INTOLERANCE AT HIGH DOSES"   Prior to Admission medications   Medication Sig Start Date End Date Taking? Authorizing Provider  acetaminophen (TYLENOL) 500 MG tablet Take 1,000 mg by mouth every 8 (eight) hours as needed for mild pain.     [provider]  azelastine (ASTELIN) 0.1 % nasal spray Place 1-2 sprays into both nostrils 2 (two) times daily. 09/09/20   Kozlow, Alvira Philips, MD  budesonide (RHINOCORT AQUA) 32 MCG/ACT nasal spray Place 1 spray into both nostrils daily.     [provider]  cholecalciferol (VITAMIN D) 1000 units tablet Take 1,000 Units by mouth daily.    [provider]  clopidogrel (PLAVIX) 75 MG tablet TAKE 1 TABLET(75 MG) BY MOUTH DAILY 04/21/20   Marykay Lex, MD  docusate sodium (COLACE) 100 MG capsule Take 200-300 mg by mouth daily as needed for mild constipation or moderate constipation.    [provider]  lisinopril (ZESTRIL) 5 MG tablet Take 1 tablet (5 mg total) by mouth in the morning and at bedtime. 10/08/20   Marykay Lex, MD  methocarbamol (ROBAXIN) 500 MG tablet Take 1-2 tablets (500-1,000 mg total) by mouth 4 (four) times daily. 04/07/20   Guy Sandifer, PA  metoprolol succinate (TOPROL-XL) 50 MG 24 hr tablet Take 1 tablet (50 mg total) by mouth in the morning and at bedtime. 10/08/20   Marykay Lex, MD  montelukast (SINGULAIR) 10 MG tablet Take 1 tablet (10 mg total) by mouth at bedtime. 09/09/20   Kozlow, Alvira Philips, MD  Multiple Vitamin (MULTIVITAMIN WITH MINERALS) TABS tablet Take 1 tablet by mouth daily.    [provider]  nitroGLYCERIN (NITROSTAT) 0.4 MG SL tablet PLACE 1 TABLET UNDER THE TONGUE IF NEEDED EVERY 5 MINUTES FOR CHEST PAIN FOR 3 DOSES; IF NO RELIEF AFTER FIRST DOSE CALL PRESCRIBER OR 911. 09/18/19   Marykay Lex, MD  tamsulosin (FLOMAX) 0.4 MG CAPS capsule Take 0.4 mg by mouth at bedtime.  06/08/14   [provider]  triamcinolone (NASACORT) 55 MCG/ACT AERO nasal inhaler Place 1 spray into the nose daily. 09/09/20   Kozlow, Alvira Philips, MD  vitamin B-12 (CYANOCOBALAMIN) 1000 MCG tablet Take 1,000 mcg by mouth 2 (two) times daily.     [provider]  vitamin C (ASCORBIC ACID) 250 MG  tablet Take 250 mg by mouth daily.     [provider]  zinc gluconate 50 MG tablet Take 50 mg by mouth in the morning and at bedtime.    [provider]  zolpidem (AMBIEN) 10 MG tablet Take 10 mg by mouth at bedtime as needed for sleep. 03/24/20   [provider]     Positive ROS: Otherwise negative  All other systems have been reviewed and were otherwise negative with the exception of those mentioned in the HPI and as above.  Physical Exam: Constitutional: Alert, well-appearing, no acute distress Ears: External ears without lesions or tenderness. Ear canals are clear bilaterally with intact, clear TMs.  Nasal: External nose without lesions. Septum is mildly deviated to the left.  Of note he has scar tissue along the floor the nose on the left side make the left nostril very small compared to the right..  On nasal endoscopy in the office today he has a purulent discharge from the left middle meatus.  No intranasal masses noted otherwise.  The nasopharynx was clear.  The right middle meatus was clear. Oral: Lips and gums without lesions. Tongue and palate mucosa without lesions. Posterior oropharynx clear. Neck: No palpable adenopathy or masses Respiratory: Breathing comfortably  Skin: No facial/neck lesions or rash noted.  Procedures  Assessment: Chronic left ethmoid and left maxillary sinus disease with probable fungal disease or polypoid disease causing chronic obstruction of the left OMC region. Septal deviation to the left with scar tissue reducing the size of the left nostril.  Plan: Reviewed with the patient that the only way to clear this would be surgical intervention.  He would like to avoid surgery if at all possible. I discussed with him concerning placing him on antibiotics for 3 weeks and switched his antibiotic to Ceftin 500 mg twice daily for 3weeks.  We will plan on obtaining a repeat fusion guided CT scan of the sinuses in 3 to 4 weeks. At that time we will discuss options which  would be medical therapy with intermittent antibiotics versus surgical intervention which should significantly improve his situation.  But if surgery is decided upon he would need cardiology clearance as well as stopping his Plavix for 4 to 5 days preop and 1 to 2 days postop.  His cardiologist is Dr. Bryan Lemma. He will call us back following his repeat CT scan in 3 to 4 weeks.   Narda Bonds, MD   CC:

## 2020-10-13 ENCOUNTER — Other Ambulatory Visit (INDEPENDENT_AMBULATORY_CARE_PROVIDER_SITE_OTHER): Payer: Self-pay

## 2020-10-13 ENCOUNTER — Telehealth: Payer: Self-pay | Admitting: Cardiology

## 2020-10-13 DIAGNOSIS — J329 Chronic sinusitis, unspecified: Secondary | ICD-10-CM

## 2020-10-13 NOTE — Telephone Encounter (Signed)
   [facility] Pre-operative Risk Assessment    Patient Name: Daniel [name]   [facility] STAFF: - Please ensure there is not already an duplicate clearance open for this procedure. - Under Visit Info/Reason for Call, type in Other and utilize the format Clearance MM/DD/YY or Clearance TBD. Do not use dashes or single digits. - If request is for dental extraction, please clarify the # of teeth to be extracted.  Request for surgical clearance:  1. What type of surgery is being performed? Left total ethmoidectomy, and left sided maxillary enlargement with tissue removal  2. When is this surgery scheduled? TBD  3. What type of clearance is required (medical clearance vs. Pharmacy clearance to hold med vs. Both)? Both  4. Are there any medications that need to be held prior to surgery and how long? Leaving up to cardiology  5. Practice name and name of physician performing surgery? Dr. [facility]  6. What is the office phone number?  [phone]   7.   What is the office fax number? [phone]  8.   Anesthesia type (None, local, MAC, general) ? General, possible 2 and a half hours of general anesia   Selena Zobro 10/13/2020, 11:19 AM  _________________________________________________________________   (provider comments below)

## 2020-10-13 NOTE — Progress Notes (Signed)
ct 

## 2020-10-15 NOTE — Telephone Encounter (Signed)
    Daniel [name] DOB:  May 18, 1933  MRN:  [id number]   Primary Cardiologist: Bryan Lemma, MD  Chart reviewed as part of pre-operative protocol coverage.   Patient was seen by Dr. Herbie Baltimore 10/08/20 for routine follow-up (note incomplete at this time). He has a history of CAD s/p CABG in 1997 with atretic LIMA-LAD with retrograde flow to LAD from SVG-D2, patent SVG-OM2, and patent stent to PDA on last LHC in 2017. Last ischemic eval was a NST in 2018 which showed no reversible ischemia.  Dr. Herbie Baltimore - can you comment on this patient's preop risk? Any objections to holding plavix prior to his procedure? Please route your response back to P CV DIV PREOP. Thank you!   Beatriz Stallion, PA-C 10/15/2020, 9:54 AM

## 2020-10-16 ENCOUNTER — Encounter: Payer: Self-pay | Admitting: Cardiology

## 2020-10-16 NOTE — Assessment & Plan Note (Signed)
Again, no longer taking statin.  Not interested in trying new medications--most notably does not want to do injections.  We talked by [facility] today he would like to get over all of his aches and pains so he would not confuse symptoms associated with any medication versus his aches and pains.  As such, we will simply monitor he has not been eating as much lately and may have better labs.  He should be due for labs be checked later on this summer by his PCP.

## 2020-10-16 NOTE — Assessment & Plan Note (Signed)
I think his fatigue is probably related to deconditioning, but could be related to medications.  Since his palpitations are pretty well controlled plan we did reduce his beta-blocker 50 bid  from 75 mg tablet daily morning.  Hopefully by reducing beta-blocker dose, he will get some more energy level.  Hopefully the more he is able to do physical therapy, he will regain strength, mobility, and endurance.

## 2020-10-16 NOTE — Assessment & Plan Note (Signed)
Significant CAD with redo CABG.  Most recent cath was in 2017 and Myoview was in 2018.  At this point I think further ischemic evaluation will simply be made based on chest pain evaluation.  If no symptoms, would not proceed as do not think he would be excited about further invasive procedures.    Plan:   Continue ACE inhibitor and beta-blocker with dose adjustment as noted.  I think we can probably discontinue Imdur and see how he does.  If symptoms of chest pain occur, would restart.  He is on maintenance Plavix which is as much for CAD as it is for his retinal vein occlusion. -->   okay to hold Plavix 5 days preop for surgeries or procedures.  He is not currently on statin or adding lipid-lowering agent and LDL is 109.  He would prefer to simply avoid taking additional medications and understands the risks.  We will not address further.

## 2020-10-16 NOTE — Telephone Encounter (Signed)
    Daniel [name] DOB:  April 02, 1933  MRN:  [id number]   Primary Cardiologist: Bryan Lemma, MD  Chart reviewed as part of pre-operative protocol coverage. Given past medical history and time since last visit, based on ACC/AHA guidelines, EURAL HOLZSCHUH would be at acceptable risk for the planned procedure without further cardiovascular testing.   Per office visit with Dr. Herbie Baltimore 10/08/20: "Recommendation :  proceed to the OR without further cardiovascular evaluation.  Low risk surgery with no active symptoms. ? Continue current dose of beta-blocker, but will hold ACE inhibitor on the morning of surgery. Is okay for him to hold Plavix 5 days preop for surgeries or procedures."  The patient was advised that if he develops new symptoms prior to surgery to contact our office to arrange for a follow-up visit, and he verbalized understanding.  I will route this recommendation to the requesting party via Epic fax function and remove from pre-op pool.  Please call with questions.  Alver Sorrow, NP 10/16/2020, 11:49 AM

## 2020-10-16 NOTE — Assessment & Plan Note (Signed)
Pretty well trolled.  Has not really had any significant episodes.  I think he reduce beta-blocker safely.

## 2020-10-16 NOTE — Assessment & Plan Note (Signed)
We have backed off on some of his medications in the past.  He is doing fairly well.  I think we can make a couple of adjustments.  He actually says his blood pressures were going back up again he started taking lisinopril 5 twice a day.  This goes along with him taking different doses of metoprolol in the morning immediately.  With this regimen, he seems to doing fairly well.  He also has some occasional dizziness, which is why splitting the lisinopril to twice a day is beneficial to avoid major drops.    Plan: We can discontinue Imdur, increase lisinopril to 5 mg twice daily (with history of instructions to hold an appointment If He Were to Notice Any Lightheadedness Dizziness with Associated Hypotension)  I will also reduce his metoprolol to 50 mg twice daily to allow for more blood pressure room

## 2020-10-16 NOTE — Assessment & Plan Note (Signed)
Pending ENT surgery.  Remains asymptomatic from a cardiac standpoint.  Despite having known CAD, he is has no active symptoms of angina or heart failure.  Please see detailed preop evaluation from October 2021 but basically with stable, nonactive CAD, he does not have heart failure, cerebrovascular disease, diabetes or renal insufficiency.  Also ENT surgery is low risk.  The revised cardiac risk index, he would be considered a low risk patient for low risk surgery.  Risk of events would be 1 to 4%.  Mitigated by being on standing dose of beta-blocker.  Recommendation :  proceed to the OR without further cardiovascular evaluation.  Low risk surgery with no active symptoms.  Continue current dose of beta-blocker, but will hold ACE inhibitor on the morning of surgery.  Is okay for him to hold Plavix 5 days preop for surgeries or procedures.

## 2020-10-16 NOTE — Telephone Encounter (Signed)
Clinic note from 10/09/2019 dictated.   Low risk patient for low risk surgery.  No further studies needed.  Okay to hold Plavix 5 days preop for surgery procedure. Hold ACE inhibitor on the morning of surgery, otherwise continue beta-blocker at current reduced dose.   Bryan Lemma, MD

## 2020-10-16 NOTE — Assessment & Plan Note (Signed)
Blood pressures been doing fairly well.  He is actually taking his lisinopril 5 mg twice daily as well as once daily.  This point he avoids hypotension spells.   Plan: Reduce Toprol to 50 mg twice daily, and increase lisinopril to 5 mg twice daily.

## 2020-10-16 NOTE — Assessment & Plan Note (Signed)
Basically atretic LIMA with the LAD distribution being fed via SVG-diagonal and then the remainder the left system being fed by the SVG-OM.  RCA is patent with patent PDA stent based on months.  No anginal symptoms.  Last ischemic evaluation was in 2018 following cardiac catheterization in 2017.  Would technically be due for surveillance monitoring with a stress test, but he would prefer to avoid further testing unless he has symptoms.

## 2020-11-12 ENCOUNTER — Other Ambulatory Visit: Payer: Self-pay

## 2020-11-12 ENCOUNTER — Ambulatory Visit
Admission: RE | Admit: 2020-11-12 | Discharge: 2020-11-12 | Disposition: A | Discharge status: Home / Self Care | Payer: Medicare Other | Source: Ambulatory Visit | Attending: Otolaryngology | Admitting: Otolaryngology

## 2020-11-12 DIAGNOSIS — J329 Chronic sinusitis, unspecified: Secondary | ICD-10-CM

## 2020-11-18 ENCOUNTER — Telehealth (INDEPENDENT_AMBULATORY_CARE_PROVIDER_SITE_OTHER): Payer: Self-pay | Admitting: Otolaryngology

## 2020-11-18 NOTE — Telephone Encounter (Signed)
Called patient today concerning his most recent CT scan of the sinuses.  On review of the CT scan of the sinuses compared to the previous scan 2 and half months ago he still has total opacification of the left maxillary sinus as well as widening of the maxillary sinus ostia consistent with chronic obstruction and probable fungal disease.  The remaining ethmoid and frontal areas appear slightly improved.  He had been on antibiotics for 3 weeks prior to the scan.  Symptomatically he is feeling much better as he generally does improve on antibiotics.  But he keeps having resistant infections when he is off the antibiotics. I discussed with him that in order to resolve this it would require surgery and would require general anesthesia for probably less than an hour in order to remove the obstructing process of the left maxillary sinus. He would like to avoid surgery if at all possible and at this point would like to take antibiotics intermittently. I discussed with him concerning his next meeting with his cardiologist to discuss possible general anesthesia with them and being off Plavix for 5 to 6 days concerning risk of the surgery. He will otherwise call us next time he has flareup of his sinus disease.

## 2020-12-01 ENCOUNTER — Other Ambulatory Visit: Payer: Self-pay | Admitting: Allergy and Immunology

## 2021-02-03 ENCOUNTER — Emergency Department (HOSPITAL_COMMUNITY): Payer: Medicare Other

## 2021-02-03 ENCOUNTER — Emergency Department (HOSPITAL_COMMUNITY)
Admission: EM | Admit: 2021-02-03 | Discharge: 2021-02-03 | Disposition: A | Discharge status: Home / Self Care | Payer: Medicare Other | Attending: Emergency Medicine | Admitting: Emergency Medicine

## 2021-02-03 ENCOUNTER — Other Ambulatory Visit: Payer: Self-pay

## 2021-02-03 DIAGNOSIS — S50811A Abrasion of right forearm, initial encounter: Secondary | ICD-10-CM | POA: Insufficient documentation

## 2021-02-03 DIAGNOSIS — W010XXA Fall on same level from slipping, tripping and stumbling without subsequent striking against object, initial encounter: Secondary | ICD-10-CM | POA: Diagnosis not present

## 2021-02-03 DIAGNOSIS — S0990XA Unspecified injury of head, initial encounter: Secondary | ICD-10-CM | POA: Diagnosis present

## 2021-02-03 DIAGNOSIS — Y9389 Activity, other specified: Secondary | ICD-10-CM | POA: Diagnosis not present

## 2021-02-03 DIAGNOSIS — Y92197 Garden or yard of other specified residential institution as the place of occurrence of the external cause: Secondary | ICD-10-CM | POA: Diagnosis not present

## 2021-02-03 DIAGNOSIS — Y990 Civilian activity done for income or pay: Secondary | ICD-10-CM | POA: Diagnosis not present

## 2021-02-03 DIAGNOSIS — S0181XA Laceration without foreign body of other part of head, initial encounter: Secondary | ICD-10-CM | POA: Insufficient documentation

## 2021-02-03 DIAGNOSIS — Z7901 Long term (current) use of anticoagulants: Secondary | ICD-10-CM | POA: Insufficient documentation

## 2021-02-03 LAB — BASIC METABOLIC PANEL
Anion gap: 7 (ref 5–15)
BUN: 23 mg/dL (ref 8–23)
CO2: 28 mmol/L (ref 22–32)
Calcium: 9.8 mg/dL (ref 8.9–10.3)
Chloride: 105 mmol/L (ref 98–111)
Creatinine, Ser: 1.05 mg/dL (ref 0.61–1.24)
GFR, Estimated: >60 mL/min (ref >60)
Glucose, Bld: 126 mg/dL — ABNORMAL HIGH (ref 70–99)
Potassium: 4.2 mmol/L (ref 3.5–5.1)
Sodium: 140 mmol/L (ref 135–145)

## 2021-02-03 LAB — CBC WITH DIFFERENTIAL/PLATELET
Abs Immature Granulocytes: 0.04 10*3/uL (ref 0.00–0.07)
Basophils Absolute: 0 10*3/uL (ref 0.0–0.1)
Basophils Relative: 0 %
Eosinophils Absolute: 0.1 10*3/uL (ref 0.0–0.5)
Eosinophils Relative: 1 %
HCT: 41.2 % (ref 39.0–52.0)
Hemoglobin: 13.4 g/dL (ref 13.0–17.0)
Immature Granulocytes: 1 %
Lymphocytes Relative: 24 %
Lymphs Abs: 1.8 10*3/uL (ref 0.7–4.0)
MCH: 30.3 pg (ref 26.0–34.0)
MCHC: 32.5 g/dL (ref 30.0–36.0)
MCV: 93.2 fL (ref 80.0–100.0)
Monocytes Absolute: 0.6 10*3/uL (ref 0.1–1.0)
Monocytes Relative: 8 %
Neutro Abs: 4.8 10*3/uL (ref 1.7–7.7)
Neutrophils Relative %: 66 %
Platelets: 210 10*3/uL (ref 150–400)
RBC: 4.42 MIL/uL (ref 4.22–5.81)
RDW: 13.2 % (ref 11.5–15.5)
WBC: 7.2 10*3/uL (ref 4.0–10.5)
nRBC: 0 % (ref 0.0–0.2)

## 2021-02-03 LAB — PROTIME-INR
INR: 1.1 (ref 0.8–1.2)
Prothrombin Time: 14.6 seconds (ref 11.4–15.2)

## 2021-02-03 MED ORDER — LIDOCAINE-EPINEPHRINE (PF) 2 %-1:200000 IJ SOLN
20.0000 mL | Freq: Once | INTRAMUSCULAR | Status: AC
  Administered 2021-02-03: 20 mL
  Filled 2021-02-03: qty 20

## 2021-02-03 NOTE — ED Notes (Signed)
TRN note--   To ED via [facility] from home-- pt was cutting bushes and got his feet tangled and tripped. No LOC, large 3" laceration to upper forehead area. Not bleeding at this time. Also has a skin tear to right wrist area.  Ring from right hand and watch placed into a pink container and labeled- at bedside.  TRN transported pt to CT and back to trauma C.   Anell Barr, RN BSN Trauma Response Nurse [phone]

## 2021-02-03 NOTE — ED Provider Notes (Signed)
[facility] EMERGENCY DEPARTMENT Provider Note   CSN: [id number] Arrival date & time: 02/03/21  1341     History No chief complaint on file.   Daniel [name] is a 87 y.o. [name].  Presents to ER with concern for laceration, fall on blood thinners.  Level 2 trauma.  Patient reports that he was in the yard working hard when he stepped awkwardly and lost his balance and fell to the ground.  States that he hit his forehead and also suffered abrasions to his right forearm.  Takes Plavix for heart disease.  Patient denies any acute pain at present.  Bleeding was stopped with direct pressure.  No LOC.  HPI     No past medical history on file.  There are no problems to display for this patient.   No family history on file.     Home Medications Prior to Admission medications   Not on File    Allergies    Patient has no allergy information on record.  Review of Systems   Review of Systems  Unable to perform ROS: Acuity of condition   Physical Exam Updated Vital Signs BP (!) 166/73   Pulse 65   Temp 99.3 F (37.4 C)   Resp 16   Ht 6\' 3"  (1.905 m)   Wt 85.3 kg   SpO2 98%   BMI 23.50 kg/m   Physical Exam Vitals and nursing note reviewed.  Constitutional:      Appearance: He is well-developed.  HENT:     Head: Normocephalic.     Comments: 6 cm long laceration across the right upper forehead region, no active bleeding noted Eyes:     Conjunctiva/sclera: Conjunctivae normal.  Cardiovascular:     Rate and Rhythm: Normal rate and regular rhythm.     Heart sounds: No murmur heard. Pulmonary:     Effort: Pulmonary effort is normal. No respiratory distress.     Breath sounds: Normal breath sounds.  Abdominal:     Palpations: Abdomen is soft.     Tenderness: There is no abdominal tenderness.  Musculoskeletal:     Cervical back: Neck supple.     Comments: Back: no C, T, L spine TTP, no step off or deformity RUE: 2 cm superficial skin tear to the  distal dorsal wrist and 2 cm superficial skin tear to the dorsum of hand, no TTP throughout, no bony deformity, normal joint ROM, radial pulse intact, distal sensation and motor intact LUE: no TTP throughout, no deformity, normal joint ROM, radial pulse intact, distal sensation and motor intact RLE:  no TTP throughout, no deformity, normal joint ROM, distal pulse, sensation and motor intact LLE: no TTP throughout, no deformity, normal joint ROM, distal pulse, sensation and motor intact  Skin:    General: Skin is warm and dry.  Neurological:     General: No focal deficit present.     Mental Status: He is alert and oriented to person, place, and time.  Psychiatric:        Mood and Affect: Mood normal.    ED Results / Procedures / Treatments   Labs (all labs ordered are listed, but only abnormal results are displayed) Labs Reviewed  BASIC METABOLIC PANEL - Abnormal; Notable for the following components:      Result Value   Glucose, Bld 126 (*)    All other components within normal limits  CBC WITH DIFFERENTIAL/PLATELET  PROTIME-INR    EKG None  Radiology CT HEAD WO  CONTRAST ( )  Result Date: 02/03/2021 CLINICAL DATA:  Head trauma, mod-severe; Neck trauma (Age >= 65y) EXAM: CT HEAD WITHOUT CONTRAST CT CERVICAL SPINE WITHOUT CONTRAST TECHNIQUE: Multidetector CT imaging of the head and cervical spine was performed following the standard protocol without intravenous contrast. Multiplanar CT image reconstructions of the cervical spine were also generated. COMPARISON:  CT cervical Apr 20, 2017 FINDINGS: CT HEAD FINDINGS Brain: No evidence of acute large vascular territory infarction, hemorrhage, hydrocephalus, extra-axial collection or mass lesion/mass effect. Mild for age atrophy. Vascular: No hyperdense vessel identified. Calcific intracranial atherosclerosis. Skull: Right frontal scalp contusion/laceration.  No acute fracture Sinuses/Orbits: Near complete opacification of the left  maxillary sinus. Thickening of the left maxillary sinus walls suggesting chronicity. Hyperdense material within left maxillary sinus. Other: No mastoid effusions. CT CERVICAL SPINE FINDINGS Alignment: Straightening of the normal cervical lordosis. Slight anterolisthesis of C3 on C4, likely degenerative given degenerative changes at level. Skull base and vertebrae: Evidence acute fracture. Partial ankylosis across the posterior disc spaces at C3-C4 and C4-C5 with solid posterior ankylosis at these levels. Bilateral lateral mass screw and rod fixation at C3-C4. Hardware appears intact. Soft tissues and spinal canal: No prevertebral fluid or swelling. No visible canal hematoma. Disc levels: Moderate multilevel degenerative disc disease. Multilevel facet and uncovertebral hypertrophy with varying degrees neural foraminal stenosis. Upper chest: Bilateral lung apices are clear. Other: Multinodular thyroid. This has been evaluated on previous imaging, including prior ultrasound guided FNA on December 21, 2007. (Ref: J Am Coll Radiol. 2015 Feb;12(2): 143-50). IMPRESSION: CT head: 1. No evidence of acute intracranial abnormality. 2. Right frontal scalp contusion/laceration without acute fracture. 3. Chronic left maxillary sinusitis with near-complete opacification and hyperdense material, which may represent inspissated secretion and/or fungal colonization. CT cervical spine: 1. No evidence of acute fracture or traumatic malalignment. 2. Solid posterior fusion at C3-C4. 3. Moderate multilevel degenerative disc disease with multilevel foraminal stenosis. Electronically Signed   By: Feliberto Harts M.D.   On: 02/03/2021 14:32   CT Cervical Spine Wo Contrast  Result Date: 02/03/2021 CLINICAL DATA:  Head trauma, mod-severe; Neck trauma (Age >= 65y) EXAM: CT HEAD WITHOUT CONTRAST CT CERVICAL SPINE WITHOUT CONTRAST TECHNIQUE: Multidetector CT imaging of the head and cervical spine was performed following the standard protocol  without intravenous contrast. Multiplanar CT image reconstructions of the cervical spine were also generated. COMPARISON:  CT cervical Apr 20, 2017 FINDINGS: CT HEAD FINDINGS Brain: No evidence of acute large vascular territory infarction, hemorrhage, hydrocephalus, extra-axial collection or mass lesion/mass effect. Mild for age atrophy. Vascular: No hyperdense vessel identified. Calcific intracranial atherosclerosis. Skull: Right frontal scalp contusion/laceration.  No acute fracture Sinuses/Orbits: Near complete opacification of the left maxillary sinus. Thickening of the left maxillary sinus walls suggesting chronicity. Hyperdense material within left maxillary sinus. Other: No mastoid effusions. CT CERVICAL SPINE FINDINGS Alignment: Straightening of the normal cervical lordosis. Slight anterolisthesis of C3 on C4, likely degenerative given degenerative changes at level. Skull base and vertebrae: Evidence acute fracture. Partial ankylosis across the posterior disc spaces at C3-C4 and C4-C5 with solid posterior ankylosis at these levels. Bilateral lateral mass screw and rod fixation at C3-C4. Hardware appears intact. Soft tissues and spinal canal: No prevertebral fluid or swelling. No visible canal hematoma. Disc levels: Moderate multilevel degenerative disc disease. Multilevel facet and uncovertebral hypertrophy with varying degrees neural foraminal stenosis. Upper chest: Bilateral lung apices are clear. Other: Multinodular thyroid. This has been evaluated on previous imaging, including prior ultrasound guided FNA on December 21, 2007. (  Ref: J Am Coll Radiol. 2015 Feb;12(2): 143-50). IMPRESSION: CT head: 1. No evidence of acute intracranial abnormality. 2. Right frontal scalp contusion/laceration without acute fracture. 3. Chronic left maxillary sinusitis with near-complete opacification and hyperdense material, which may represent inspissated secretion and/or fungal colonization. CT cervical spine: 1. No evidence of  acute fracture or traumatic malalignment. 2. Solid posterior fusion at C3-C4. 3. Moderate multilevel degenerative disc disease with multilevel foraminal stenosis. Electronically Signed   By: Feliberto Harts M.D.   On: 02/03/2021 14:32    Procedures .Marland KitchenLaceration Repair  Date/Time: 02/03/2021 3:13 PM Performed by: Milagros Loll, MD Authorized by: Milagros Loll, MD   Consent:    Consent obtained:  Verbal   Consent given by:  Patient   Risks, benefits, and alternatives were discussed: yes     Risks discussed:  Infection, pain, retained foreign body, tendon damage, poor cosmetic result, need for additional repair, nerve damage, poor wound healing and vascular damage   Alternatives discussed:  No treatment Universal protocol:    Immediately prior to procedure, a time out was called: yes     Patient identity confirmed:  Verbally with patient Anesthesia:    Anesthesia method:  Local infiltration   Local anesthetic:  Lidocaine 1% WITH epi and lidocaine 2% WITH epi Laceration details:    Length (cm):  6 Exploration:    Hemostasis achieved with:  Direct pressure   Contaminated: yes   Treatment:    Area cleansed with:  Povidone-iodine   Amount of cleaning:  Extensive   Irrigation solution:  Sterile saline   Irrigation method:  Syringe   Visualized foreign bodies/material removed: yes (grassy debris)     Debridement:  None   Undermining:  None   Scar revision: no     Layers/structures repaired:  Deep dermal/superficial fascia Deep dermal/superficial fascia:    Suture size:  5-0   Suture material:  Vicryl   Suture technique:  Simple interrupted   Number of sutures:  2 Skin repair:    Repair method:  Sutures   Suture size:  5-0   Suture material:  Nylon   Suture technique:  Simple interrupted   Number of sutures:  5 Approximation:    Approximation:  Close Repair type:    Repair type:  Complex Post-procedure details:    Procedure completion:  Tolerated well, no  immediate complications Comments:     Bleeding controlled with direct pressure.  Patient had occasional grassy debris noted inside of the wound, irrigated extensively with sterile saline and dilute Betadine solution.  Placed 2 deep sutures with Vicryl and then closed the skin with nylon sutures.  Good wound approximation achieved.   Medications Ordered in ED Medications  lidocaine-EPINEPHrine (XYLOCAINE W/EPI) 2 %-1:200000 (PF) injection Daniel mL (Daniel mLs Infiltration Given 02/03/21 1359)    ED Course  I have reviewed the triage vital signs and the nursing notes.  Pertinent labs & imaging results that were available during my care of the patient were reviewed by me and considered in my medical decision making (see chart for details).    MDM Rules/Calculators/A&P                           87 year old gentleman presents to ER as a level 2 trauma due to fall, head trauma on blood thinner.  Takes Plavix.  On exam patient well-appearing in no distress, noted to have large laceration to his forehead as well as a couple  skin tears to his right upper extremity.  No focal bony tenderness throughout.  CT imaging negative for acute traumatic pathology.  Radiologist commented on chronic sinusitis.  Per review of chart and patient's regular chart, this is a known issue and has been followed by ENT.  Provided thorough irrigation of wound and repaired at bedside.  Instructed to follow-up with PCP for wound recheck and likely suture removal in 1 week. Local wound care for the skin tears to forearm.     After the discussed management above, the patient was determined to be safe for discharge.  The patient was in agreement with this plan and all questions regarding their care were answered.  ED return precautions were discussed and the patient will return to the ED with any significant worsening of condition.    Final Clinical Impression(s) / ED Diagnoses Final diagnoses:  Forehead laceration, initial  encounter    Rx / DC Orders ED Discharge Orders     None        Milagros Lollykstra, [place] S, MD 02/03/21 1515

## 2021-02-03 NOTE — ED Notes (Signed)
Patient discharge instructions reviewed with the patient. The patient verbalized understanding of instructions. Patient discharged. 

## 2021-02-03 NOTE — ED Notes (Signed)
Patient transported to CT 

## 2021-02-03 NOTE — ED Notes (Signed)
Patient BIB [facility] from home. Patient was ambulating in yard, pt had mechanical fall, fell face down onto stone pa[place], laceration to top of head, skin tear to right arm. Pt A&Ox4, denies LOC, pt endorses being on plavix. VSS.

## 2021-02-03 NOTE — Discharge Instructions (Addendum)
Follow-up with your primary care doctor in 1 week for wound recheck and suture removal.  If you have any additional falls, redness, drainage, or other new concerning symptom, come back to ER for reassessment.

## 2021-03-04 ENCOUNTER — Other Ambulatory Visit: Payer: Self-pay | Admitting: Allergy and Immunology

## 2021-03-08 ENCOUNTER — Other Ambulatory Visit: Payer: Self-pay

## 2021-03-08 ENCOUNTER — Emergency Department (HOSPITAL_BASED_OUTPATIENT_CLINIC_OR_DEPARTMENT_OTHER)
Admission: EM | Admit: 2021-03-08 | Discharge: 2021-03-08 | Disposition: A | Discharge status: Home / Self Care | Payer: Medicare Other | Attending: Emergency Medicine | Admitting: Emergency Medicine

## 2021-03-08 ENCOUNTER — Emergency Department (HOSPITAL_BASED_OUTPATIENT_CLINIC_OR_DEPARTMENT_OTHER): Payer: Medicare Other

## 2021-03-08 ENCOUNTER — Telehealth: Payer: Self-pay | Admitting: Physician Assistant

## 2021-03-08 ENCOUNTER — Encounter (HOSPITAL_BASED_OUTPATIENT_CLINIC_OR_DEPARTMENT_OTHER): Payer: Self-pay | Admitting: Emergency Medicine

## 2021-03-08 DIAGNOSIS — Z951 Presence of aortocoronary bypass graft: Secondary | ICD-10-CM | POA: Diagnosis not present

## 2021-03-08 DIAGNOSIS — I10 Essential (primary) hypertension: Secondary | ICD-10-CM | POA: Insufficient documentation

## 2021-03-08 DIAGNOSIS — Z7902 Long term (current) use of antithrombotics/antiplatelets: Secondary | ICD-10-CM | POA: Insufficient documentation

## 2021-03-08 DIAGNOSIS — D72829 Elevated white blood cell count, unspecified: Secondary | ICD-10-CM | POA: Insufficient documentation

## 2021-03-08 DIAGNOSIS — Z96651 Presence of right artificial knee joint: Secondary | ICD-10-CM | POA: Diagnosis not present

## 2021-03-08 DIAGNOSIS — I2510 Atherosclerotic heart disease of native coronary artery without angina pectoris: Secondary | ICD-10-CM | POA: Insufficient documentation

## 2021-03-08 DIAGNOSIS — Z79899 Other long term (current) drug therapy: Secondary | ICD-10-CM | POA: Diagnosis not present

## 2021-03-08 LAB — CBC WITH DIFFERENTIAL/PLATELET
Abs Immature Granulocytes: 0.04 10*3/uL (ref 0.00–0.07)
Basophils Absolute: 0 10*3/uL (ref 0.0–0.1)
Basophils Relative: 0 %
Eosinophils Absolute: 0 10*3/uL (ref 0.0–0.5)
Eosinophils Relative: 0 %
HCT: 40.4 % (ref 39.0–52.0)
Hemoglobin: 13.3 g/dL (ref 13.0–17.0)
Immature Granulocytes: 0 %
Lymphocytes Relative: 16 %
Lymphs Abs: 1.8 10*3/uL (ref 0.7–4.0)
MCH: 30 pg (ref 26.0–34.0)
MCHC: 32.9 g/dL (ref 30.0–36.0)
MCV: 91.2 fL (ref 80.0–100.0)
Monocytes Absolute: 0.9 10*3/uL (ref 0.1–1.0)
Monocytes Relative: 8 %
Neutro Abs: 8.6 10*3/uL — ABNORMAL HIGH (ref 1.7–7.7)
Neutrophils Relative %: 76 %
Platelets: 241 10*3/uL (ref 150–400)
RBC: 4.43 MIL/uL (ref 4.22–5.81)
RDW: 13.4 % (ref 11.5–15.5)
WBC: 11.3 10*3/uL — ABNORMAL HIGH (ref 4.0–10.5)
nRBC: 0 % (ref 0.0–0.2)

## 2021-03-08 LAB — URINALYSIS, ROUTINE W REFLEX MICROSCOPIC
Bilirubin Urine: NEGATIVE
Glucose, UA: NEGATIVE mg/dL
Hgb urine dipstick: NEGATIVE
Ketones, ur: NEGATIVE mg/dL
Leukocytes,Ua: NEGATIVE
Nitrite: NEGATIVE
Protein, ur: NEGATIVE mg/dL
Specific Gravity, Urine: 1.007 (ref 1.005–1.030)
pH: 6.5 (ref 5.0–8.0)

## 2021-03-08 LAB — COMPREHENSIVE METABOLIC PANEL
ALT: 18 U/L (ref 0–44)
AST: 20 U/L (ref 15–41)
Albumin: 3.8 g/dL (ref 3.5–5.0)
Alkaline Phosphatase: 72 U/L (ref 38–126)
Anion gap: 7 (ref 5–15)
BUN: 29 mg/dL — ABNORMAL HIGH (ref 8–23)
CO2: 29 mmol/L (ref 22–32)
Calcium: 9.5 mg/dL (ref 8.9–10.3)
Chloride: 104 mmol/L (ref 98–111)
Creatinine, Ser: 0.88 mg/dL (ref 0.61–1.24)
GFR, Estimated: >60 mL/min (ref >60)
Glucose, Bld: 92 mg/dL (ref 70–99)
Potassium: 3.9 mmol/L (ref 3.5–5.1)
Sodium: 140 mmol/L (ref 135–145)
Total Bilirubin: 0.7 mg/dL (ref 0.3–1.2)
Total Protein: 7.1 g/dL (ref 6.5–8.1)

## 2021-03-08 LAB — TROPONIN I (HIGH SENSITIVITY): Troponin I (High Sensitivity): 6 ng/L (ref <18)

## 2021-03-08 LAB — MAGNESIUM: Magnesium: 2 mg/dL (ref 1.7–2.4)

## 2021-03-08 MED ORDER — METOPROLOL SUCCINATE ER 25 MG PO TB24
50.0000 mg | ORAL_TABLET | Freq: Once | ORAL | Status: AC
  Administered 2021-03-08: 50 mg via ORAL
  Filled 2021-03-08: qty 2

## 2021-03-08 MED ORDER — LISINOPRIL 10 MG PO TABS
5.0000 mg | ORAL_TABLET | Freq: Once | ORAL | Status: AC
  Administered 2021-03-08: 5 mg via ORAL
  Filled 2021-03-08: qty 1

## 2021-03-08 MED ORDER — NITROGLYCERIN 2 % TD OINT
1.0000 [in_us] | TOPICAL_OINTMENT | Freq: Once | TRANSDERMAL | Status: AC
  Administered 2021-03-08: 1 [in_us] via TOPICAL
  Filled 2021-03-08: qty 1

## 2021-03-08 NOTE — ED Triage Notes (Signed)
Pt had a cortisone shot to left leg on Thursday. He noted his blood pressure has been elevated since Friday. Asymptomatic. Pt has not taken his blood pressure medicine today.

## 2021-03-08 NOTE — Telephone Encounter (Signed)
   The patient called the answering service after-hours today regarding elevated BP. Chart reviewed. Historically has had issues with low blood pressure/orthostasis. Got a shot of cortisone in his knee on Friday. Yesterday (Saturday) didn't feel quite right, felt pulsation in his head, SBP 180-190 range. It came down to the 140 range yesterday afternoon but by last night back up to 190. He increased his lisinopril to 2 tablets twice daily from 1 tablet twice daily and continued prior dose of metoprolol but BP remains 190-200 systolic. He feels mildly weak but otherwise states he feels pretty good. No cardiac symptoms, focal neuro symptoms, headache or further pulsing sensation in head. As he is has already self-adjusted his medication yet BP remains high, I advised he go be seen in person today for evaluation at an urgent care center to have his blood pressure evaluated by a provider - given his h/o hypotension in our records, need to ensure the home BPs he's getting are accurate. The patient verbalized understanding and gratitude. His son will drive him.  Laurann [place], PA-C

## 2021-03-08 NOTE — ED Provider Notes (Signed)
[facility] EMERGENCY DEPT Provider Note   CSN: [id number] Arrival date & time: 03/08/21  1023     History Chief Complaint  Patient presents with   Hypertension    Daniel [name] is a 87 y.o. male.  HPI Patient presents for hypertension.  He reports that he typically has blood pressure in the range of 120/80.  Blood pressure is managed with lisinopril and metoprolol.  His blood pressure, when measured at home, has been elevated for the past 2 days.  Yesterday, he took double doses of his blood pressure medications.  Blood pressure remained elevated despite this.  He currently has a sinus infection, for which she is taking clindamycin.  Steroid injection into his left knee recently.  Days ago, he had a seafood dinner and ate more than normally.  He denies any other recent events or changes in daily habits.  He also denies any symptoms, including headache, vision changes, weakness, numbness, chest pain, shortness of breath, abdominal pain, nausea.  He is followed by cardiologist, who manages his blood pressure medications.  He attempted to call the cardiology office today but was not able to get a hold of them.    Past Medical History:  Diagnosis Date   Arthritis    CAD of autologous arterial graft 2010   Atretic LIMA; Myoview Jan 2015: Possible mild basal anteroseptal defect thought to be artifact (CRO early LAD ischemia due to inadeuate retrograde perfusion via SVG-Diag   CAD S/P percutaneous coronary angioplasty 11/2006   a. 6/'08: PCI nongrafted distal RCA-PDA: Mini vision BMS 2.25 mm x 28 mm; b. Relook Cath 08/2015: Stable CAD. No culprit lesion. Widely patent PDA stent. Patent SVG-OM 2, SVG-D2. Essentially atretic/small LIMA-LAD, but the LAD is perfused via SVG-D2   Complication of anesthesia    Coronary artery disease involving left main coronary artery [id number]   Last cath 2010: 70-80 % ostial left main, 80-90% LAD, subtotal CTO LCx-OM2, RCA patent w/ patent BMS stent  (dRCA-PDA); SVG-diagonal backfills LAD, atretic LIMA-LAD -- medical therapy; stable by relook cath in March 2017   Difficult intubation    VERY NARROW AIRWAY PER ANESTHESIOLOGIST (1997 CABG AT [facility])   Dyslipidemia, goal LDL below 70    Dysrhythmia    Heart palpitations    Never fully diagnosed.   History of kidney stones    Hypertension    Insomnia    Mass in chest    JUST WATCHING , WILL CHECK OUT AFTER SURGERY   Pneumonia    S/P CABG x 3 1997   LIMA-LAD, SVG-D2, SVG-OM --> LIMA known to be atretic, but SVG-D2 fills diagonal and LAD   Stenosis of cervical spine with myelopathy (HCC)    Tingling    in fingers    Patient Active Problem List   Diagnosis Date Noted   Stenosis of cervical spine with myelopathy (HCC) 07/14/2017   DOE (dyspnea on exertion) 03/10/2017   Acute maxillary sinusitis 02/15/2017   Allergic rhinitis 02/15/2017   Preoperative cardiovascular examination 08/16/2016   Chronic pain of both knees 07/13/2016   Fatigue due to treatment 09/11/2015   Unstable angina (HCC) 08/06/2015   Low TSH level 08/13/2014   Orthostatic hypotension 12/17/2013   Pseudoptosis 05/22/2013   Dermatochalasis 02/21/2013   Epiphora 02/21/2013   S/P CABG x 3    Essential hypertension    Dyslipidemia, goal LDL below 70    Heart palpitations    Nuclear cataract 01/26/2012   PCO (posterior capsular opacification) 01/26/2012  Pseudophakia 01/26/2012   LM-CAD - atretic LIMA-LAD, patent SVG-D2-> fills LAD & SVG-OM, BMS PCI rPDA 07/27/1995    Past Surgical History:  Procedure Laterality Date   48-HOUR MONITOR  03/2017   Relatively normal.  Very infrequent ectopy.  Short PAT run with occasional PACs/PVCs and bigeminy.  No symptoms noted on monitor.  Longest PAT runs were less than 10 beats.   BACK SURGERY     decompression  lower back    CARDIAC CATHETERIZATION N/A 08/08/2015   Procedure: Left Heart Cath and Cors/Grafts Angiography;  Surgeon: Marykay Lex, MD;  Location: [facility] INVASIVE CV  LAB;; -->  LM 80%-70%. Ost LAD 80% then prox-mid 100% (after small D1). Small LCx & OM2 patent with 100% OM2. Prox & distal RCA 20%, patent BMS in distal RCA-rPDA.  Patent/atretic LIMA-dLAD, SVG-D2 (backdills entire dLAD) & SVG-OM2.    COLONOSCOPY     CORONARY ANGIOPLASTY WITH STENT PLACEMENT  [facility] 2008   BMS PCI of distal RCA-RPDA: PCI with a 2.25 mm x 28 mm Mini Vision bare-metal stent.   CORONARY ARTERY BYPASS GRAFT  [id number]   LIMA - LAD, SVG- diagonal, SVG-OM   EYE SURGERY     BIL CATARACT   KIDNEY STONE SURGERY     KNEE ARTHROSCOPY     RT  KNEE   LEFT HEART CATH AND CORS/GRAFTS ANGIOGRAPHY  11/2006   High-grade distal RCA and PDA stenosis --> BMS PCI.    LEFT HEART CATH AND CORS/GRAFTS ANGIOGRAPHY  2010   Native coronaries at 70% to 80% ostial left main. LAD had diffuse 80% to 90% stenosis   NM MYOVIEW LTD  02/25/2004; Jan 2015   a) EF 57%; b)  Normal Nuclear Stress Test - No evidence of Ischemia or Infarction. Normal LV Function & wall motion.   NM MYOVIEW LTD  03/17/2017   No significant reversible ischemia. Increased TID ratio 1.34. LVEF 54% with normal wall motion. This is a low risk study.   POSTERIOR CERVICAL FUSION/FORAMINOTOMY N/A 07/14/2017   Procedure: LAMINECTOMY CERVICAL THREE- CERVICAL FOUR WITH LATERAL MASS FUSION;  Surgeon: Lisbeth Renshaw, MD;  Location: [facility] OR;  Service: Neurosurgery;  Laterality: N/A;  LAMINECTOMY CERVICAL 3- CERVICAL 4 WITH LATERAL MASS FUSION   TOTAL KNEE ARTHROPLASTY Right 04/07/2020   Procedure: TOTAL KNEE ARTHROPLASTY;  Surgeon: Dannielle Huh, MD;  Location: [facility] ORS;  Service: Orthopedics;  Laterality: Right;       Family History  Problem Relation Age of Onset   Stroke Mother 38   Heart disease Mother    Arthritis Mother    Heart attack Father 2   Other Sister    Heart disease Brother    Allergic rhinitis Neg Hx    Atopy Neg Hx    Angioedema Neg Hx    Asthma Neg Hx    Eczema Neg Hx    Immunodeficiency Neg Hx    Urticaria Neg Hx      Social History   Tobacco Use   Smoking status: Never   Smokeless tobacco: Never  Vaping Use   Vaping Use: Never used  Substance Use Topics   Alcohol use: No   Drug use: No    Home Medications Prior to Admission medications   Medication Sig Start Date End Date Taking? Authorizing Provider  clopidogrel (PLAVIX) 75 MG tablet TAKE 1 TABLET(75 MG) BY MOUTH DAILY 04/21/20  Yes Marykay Lex, MD  lisinopril (ZESTRIL) 5 MG tablet Take 1 tablet (5 mg total) by mouth in the morning  and at bedtime. 10/08/20  Yes Marykay Lex, MD  methocarbamol (ROBAXIN) 500 MG tablet Take 1-2 tablets (500-1,000 mg total) by mouth 4 (four) times daily. 04/07/20  Yes Guy Sandifer, PA  metoprolol succinate (TOPROL-XL) 50 MG 24 hr tablet Take 1 tablet (50 mg total) by mouth in the morning and at bedtime. 10/08/20  Yes Marykay Lex, MD  montelukast (SINGULAIR) 10 MG tablet TAKE 1 TABLET(10 MG) BY MOUTH AT BEDTIME 03/04/21  Yes Kozlow, Alvira Philips, MD  Multiple Vitamin (MULTIVITAMIN WITH MINERALS) TABS tablet Take 1 tablet by mouth daily.   Yes [provider]  tamsulosin (FLOMAX) 0.4 MG CAPS capsule Take 0.4 mg by mouth at bedtime.  06/08/14  Yes [provider]  vitamin B-12 (CYANOCOBALAMIN) 1000 MCG tablet Take 1,000 mcg by mouth 2 (two) times daily.    Yes [provider]  vitamin C (ASCORBIC ACID) 250 MG tablet Take 250 mg by mouth daily.   Yes [provider]  zinc gluconate 50 MG tablet Take 50 mg by mouth in the morning and at bedtime.   Yes [provider]  zolpidem (AMBIEN) 10 MG tablet Take 10 mg by mouth at bedtime as needed for sleep. 03/24/20  Yes [provider]  acetaminophen (TYLENOL) 500 MG tablet Take 1,000 mg by mouth every 8 (eight) hours as needed for mild pain.    [provider]  azelastine (ASTELIN) 0.1 % nasal spray Place 1-2 sprays into both nostrils 2 (two) times daily. 09/09/20   Kozlow, Alvira Philips, MD  budesonide (RHINOCORT  AQUA) 32 MCG/ACT nasal spray Place 1 spray into both nostrils daily.     [provider]  cholecalciferol (VITAMIN D) 1000 units tablet Take 1,000 Units by mouth daily.    [provider]  docusate sodium (COLACE) 100 MG capsule Take 200-300 mg by mouth daily as needed for mild constipation or moderate constipation.    [provider]  nitroGLYCERIN (NITROSTAT) 0.4 MG SL tablet PLACE 1 TABLET UNDER THE TONGUE IF NEEDED EVERY 5 MINUTES FOR CHEST PAIN FOR 3 DOSES; IF NO RELIEF AFTER FIRST DOSE CALL PRESCRIBER OR 911. 09/18/19   Marykay Lex, MD  triamcinolone (NASACORT) 55 MCG/ACT AERO nasal inhaler Place 1 spray into the nose daily. 09/09/20   Kozlow, Alvira Philips, MD    Allergies    Penicillins, Sulfa antibiotics, and Statins  Review of Systems   Review of Systems  Constitutional:  Negative for activity change, appetite change, chills, fatigue and fever.  HENT:  Positive for congestion and sinus pressure. Negative for ear pain, facial swelling, sore throat and trouble swallowing.   Eyes:  Negative for pain and visual disturbance.  Respiratory:  Negative for cough, chest tightness and shortness of breath.   Cardiovascular:  Negative for chest pain, palpitations and leg swelling.  Gastrointestinal:  Negative for abdominal distention, abdominal pain, nausea and vomiting.  Genitourinary:  Negative for dysuria, flank pain and hematuria.  Musculoskeletal:  Negative for arthralgias, back pain, myalgias and neck pain.  Skin:  Negative for color change and rash.  Neurological:  Negative for dizziness, seizures, syncope, facial asymmetry, speech difficulty, weakness, light-headedness, numbness and headaches.  Psychiatric/Behavioral:  Negative for confusion and decreased concentration.   All other systems reviewed and are negative.  Physical Exam Updated Vital Signs BP (!) 174/76 (BP Location: Right Arm)   Pulse 60   Temp 98.5 F (36.9 C) (Oral)   Resp 16   SpO2 100%  Physical Exam Vitals and nursing note reviewed.  Constitutional:      General: He is not in acute distress.    Appearance: Normal appearance. He is well-developed and normal weight. He is not ill-appearing or toxic-appearing.  HENT:     Head: Normocephalic.     Comments: Well-healing scar to right frontal scalp    Right Ear: External ear normal.     Left Ear: External ear normal.     Nose: Congestion present.     Mouth/Throat:     Mouth: Mucous membranes are moist.  Eyes:     Extraocular Movements: Extraocular movements intact.     Conjunctiva/sclera: Conjunctivae normal.  Cardiovascular:     Rate and Rhythm: Normal rate and regular rhythm.     Heart sounds: No murmur heard. Pulmonary:     Effort: Pulmonary effort is normal. No respiratory distress.     Breath sounds: Normal breath sounds. No wheezing or rales.  Chest:     Chest wall: No tenderness.  Abdominal:     Palpations: Abdomen is soft.     Tenderness: There is no abdominal tenderness.  Musculoskeletal:        General: Normal range of motion.     Cervical back: Normal range of motion and neck supple.     Right lower leg: No edema.     Left lower leg: No edema.  Skin:    General: Skin is warm and dry.     Capillary Refill: Capillary refill takes less than 2 seconds.     Coloration: Skin is not jaundiced or pale.  Neurological:     Mental Status: He is alert and oriented to person, place, and time.     Cranial Nerves: Cranial nerves are intact. No cranial nerve deficit, dysarthria or facial asymmetry.     Sensory: Sensation is intact. No sensory deficit.     Motor: Motor function is intact. No abnormal muscle tone.     Coordination: Coordination is intact.  Psychiatric:        Mood and Affect: Mood normal.        Behavior: Behavior normal.    ED Results / Procedures / Treatments   Labs (all labs ordered are listed, but only abnormal results are displayed) Labs Reviewed  CBC WITH DIFFERENTIAL/PLATELET -  Abnormal; Notable for the following components:      Result Value   WBC 11.3 (*)    Neutro Abs 8.6 (*)    All other components within normal limits  COMPREHENSIVE METABOLIC PANEL - Abnormal; Notable for the following components:   BUN 29 (*)    All other components within normal limits  URINALYSIS, ROUTINE W REFLEX MICROSCOPIC - Abnormal; Notable for the following components:   Color, Urine COLORLESS (*)    All other components within normal limits  MAGNESIUM  TROPONIN I (HIGH SENSITIVITY)    EKG None  Radiology CT Head Wo Contrast  Result Date: 03/08/2021 CLINICAL DATA:  Elevated blood pressure. EXAM: CT HEAD WITHOUT CONTRAST TECHNIQUE: Contiguous axial images were obtained from the base of the skull through the vertex without intravenous contrast. COMPARISON:  Brain CT 02/03/2021. FINDINGS: Brain: Ventricles and sulci are appropriate for patient's age. No evidence for acute cortically based infarct, intracranial hemorrhage, mass lesion or mass-effect. Vascular: Unremarkable Skull: Intact Sinuses/Orbits: Chronic left maxillary sinusitis. Remainder of the paranasal sinuses well aerated. Mastoid air cells are unremarkable. Orbits unremarkable. Other: None. IMPRESSION: No acute intracranial process. Electronically Signed   By: Annia Belt  M.D.   On: 03/08/2021 12:07   DG Chest Portable 1 View  Result Date: 03/08/2021 CLINICAL DATA:  Hypertension. EXAM: PORTABLE CHEST 1 VIEW COMPARISON:  08/05/2017 FINDINGS: Elevated RIGHT hemidiaphragm is stable. Status post median sternotomy and CABG. Heart size is normal. Lungs are free of focal consolidations and pleural effusions. No pulmonary edema. Remote LEFT rib fracture. IMPRESSION: No evidence for acute  abnormality. Electronically Signed   By: Norva Pavlov M.D.   On: 03/08/2021 12:07    Procedures Procedures   Medications Ordered in ED Medications  lisinopril (ZESTRIL) tablet 5 mg (5 mg Oral Given 03/08/21 1110)  metoprolol succinate  (TOPROL-XL) 24 hr tablet 50 mg (50 mg Oral Given 03/08/21 1109)  nitroGLYCERIN (NITROGLYN) 2 % ointment 1 inch (1 inch Topical Given 03/08/21 1114)    ED Course  I have reviewed the triage vital signs and the nursing notes.  Pertinent labs & imaging results that were available during my care of the patient were reviewed by me and considered in my medical decision making (see chart for details).    MDM Rules/Calculators/A&P                          Patient presents for asymptomatic hypertension.  This has been present on home blood pressure readings for the past 2 days.  He took double doses of blood pressure medicines yesterday.  He has had to take any blood pressure medication today.  He denies any associated symptoms.  Blood pressure on arrival was 200s over 80s.  Patient was given his morning doses of blood pressure medicine in addition to Nitropaste.  Diagnostic work-up was initiated to identify any possible endorgan damage.  On reassessment, patient remains asymptomatic.  Blood pressure trended down to range of 170s over 70s.  Work-up was reassuring.  He had a mild leukocytosis, which is likely from his sinusitis, for which she is on antibiotics currently.  There was no evidence of endorgan damage.  Patient was informed of these results.  He was advised to contact his cardiologist tomorrow to discuss possibly changing doses and/or medications for management of blood pressure.  Patient was discharged in good condition.  Final Clinical Impression(s) / ED Diagnoses Final diagnoses:  Hypertension, unspecified type    Rx / DC Orders ED Discharge Orders     None        Gloris Manchester, MD 03/08/21 2112

## 2021-03-08 NOTE — ED Notes (Signed)
Pt dc. Reviewed dc instructions and stated understanding.

## 2021-03-08 NOTE — ED Triage Notes (Signed)
Pt has been on clindamycin for a sinus infection but did not take the last 2 days, thinking that was culprit for high blood pressure.

## 2021-03-09 ENCOUNTER — Telehealth: Payer: Self-pay | Admitting: Cardiology

## 2021-03-09 NOTE — Telephone Encounter (Signed)
Patient had to go to the [facility] ED yesterday morning because his BP was high. It has come back down since his ED visit  His BP started to be high Friday afternoon and continued to be high until Sunday morning, so he went to the ED. He was told to notify Dr. Herbie Baltimore of his ED visit and wanted to know if any of his medications may need to be adjusted.  He said the only thing that changed was that he got a cortisone shot in his Left knee on Thursday afternoon.   Please let the patient know what he needs to do

## 2021-03-09 NOTE — Telephone Encounter (Signed)
Spoke to patient he stated when he woke up yesterday his B/P 211/100.He went to ED at [facility].Stated labs,cxr,head ct were all normal.B/P this morning 154/111 pulse 73.Stated he has not done anything different except he had a cortisone injection in left knee last Thursday.Advised I will send message to Dr.Harding for advice.

## 2021-03-09 NOTE — Telephone Encounter (Signed)
I suspect he probably has some do the steroid injection.  If he has blood pressures at that level, but I would probably do is double up the dose of lisinopril for the day.  Recheck in about an hour and it should be better.   Bryan Lemma, MD

## 2021-03-09 NOTE — Telephone Encounter (Signed)
Responded in message responding to triage call

## 2021-03-10 NOTE — Telephone Encounter (Signed)
Can someone please contact the pt and advise them of Dr. Jennye Boroughs recommendation?

## 2021-03-10 NOTE — Telephone Encounter (Signed)
Advised patient of recommendations, verbalized understanding  

## 2021-04-01 ENCOUNTER — Other Ambulatory Visit: Payer: Self-pay | Admitting: Cardiology

## 2021-04-09 ENCOUNTER — Other Ambulatory Visit: Payer: Self-pay | Admitting: Cardiology

## 2021-04-16 ENCOUNTER — Other Ambulatory Visit: Payer: Self-pay | Admitting: Cardiology

## 2021-04-20 ENCOUNTER — Encounter: Payer: Self-pay | Admitting: Orthopaedic Surgery

## 2021-04-20 ENCOUNTER — Ambulatory Visit: Payer: Self-pay

## 2021-04-20 ENCOUNTER — Other Ambulatory Visit: Payer: Self-pay

## 2021-04-20 ENCOUNTER — Ambulatory Visit: Payer: Medicare Other | Admitting: Orthopaedic Surgery

## 2021-04-20 DIAGNOSIS — M5441 Lumbago with sciatica, right side: Secondary | ICD-10-CM

## 2021-04-20 DIAGNOSIS — M25551 Pain in right hip: Secondary | ICD-10-CM | POA: Diagnosis not present

## 2021-04-20 DIAGNOSIS — G8929 Other chronic pain: Secondary | ICD-10-CM

## 2021-04-20 DIAGNOSIS — M5442 Lumbago with sciatica, left side: Secondary | ICD-10-CM | POA: Diagnosis not present

## 2021-04-20 DIAGNOSIS — M25552 Pain in left hip: Secondary | ICD-10-CM

## 2021-04-20 NOTE — Progress Notes (Signed)
The patient is a 87 year old gentleman who comes in ambulate with a cane and reports bilateral hip pain.  However he points to the sciatic area and SI joint area of both his hips but more the low back is a source of his pain.  He has remote history over 10 years ago of lumbar spine surgery at L4-L5 by Dr. Otelia Sergeant.  He is on Plavix but can come off of Plavix.  He has had his right knee replaced by Dr. Valentina Gu.  His left knee does have arthritis in the left knee.  He did have a recent steroid ejection left knee and is not having significant tachycardia after that injection and ended up in the ER.  He said he can find out what was put in his knee.  He has had a remote history of injections in his spine by Dr. Alvester Morin in the past.  He has had a recent fall as well.  He does feel like he is getting unsteady sometimes in his gait.  He has had no other acute changes in medical status.  Both hips move smoothly and fluidly with no pain in the groin at all.  There is no blocks or rotation of either hip.  His pain seems to be bilaterally at the ischium and SI joint area of both sides of the pelvis.  He has no radicular symptoms going down his legs at all.  An AP pelvis and lateral both hips shows normal-appearing hip joint spaces.  An AP and lateral lumbar spine shows degenerative changes at several levels.  I gave him reassurance that his hips look good.  However he is having enough issues that he would like to see Dr. Alvester Morin as a consultation and I believe this is reasonable as well because of not sure where to have Dr. Alvester Morin inject in terms of his this SI joint injections versus facet injections versus ESI's.  We can see if Dr. Alvester Morin will see him as a consultation and then go from there.  All question concerns were answered and addressed.

## 2021-04-27 ENCOUNTER — Ambulatory Visit: Payer: Medicare Other | Admitting: Cardiology

## 2021-04-27 ENCOUNTER — Other Ambulatory Visit: Payer: Self-pay

## 2021-04-27 ENCOUNTER — Encounter: Payer: Self-pay | Admitting: Cardiology

## 2021-04-27 VITALS — BP 160/80 | HR 69 | Ht 75.0 in | Wt 197.0 lb

## 2021-04-27 DIAGNOSIS — I25118 Atherosclerotic heart disease of native coronary artery with other forms of angina pectoris: Secondary | ICD-10-CM

## 2021-04-27 DIAGNOSIS — I10 Essential (primary) hypertension: Secondary | ICD-10-CM

## 2021-04-27 DIAGNOSIS — T466X5A Adverse effect of antihyperlipidemic and antiarteriosclerotic drugs, initial encounter: Secondary | ICD-10-CM

## 2021-04-27 DIAGNOSIS — E785 Hyperlipidemia, unspecified: Secondary | ICD-10-CM

## 2021-04-27 DIAGNOSIS — M791 Myalgia, unspecified site: Secondary | ICD-10-CM

## 2021-04-27 DIAGNOSIS — I951 Orthostatic hypotension: Secondary | ICD-10-CM | POA: Diagnosis not present

## 2021-04-27 DIAGNOSIS — R0609 Other forms of dyspnea: Secondary | ICD-10-CM

## 2021-04-27 DIAGNOSIS — R002 Palpitations: Secondary | ICD-10-CM

## 2021-04-27 MED ORDER — LISINOPRIL 5 MG PO TABS: ORAL_TABLET | ORAL | 3 refills | Status: DC

## 2021-04-27 NOTE — Progress Notes (Signed)
Primary Care Provider: Burnard Bunting, MD Cardiologist: Glenetta Hew, MD Electrophysiologist: None  Clinic Note: Chief Complaint  Patient presents with   Follow-up    6 months   Hospitalization Follow-up    Recent ER visit for severe hypertension    ===================================  ASSESSMENT/PLAN   Problem List Items Addressed This Visit       Cardiology Problems   LM-CAD - atretic LIMA-LAD, patent SVG-D2-> fills LAD & SVG-OM, BMS PCI rPDA - Primary (Chronic)    Longstanding history of CAD-CABG with atretic LIMA, patent vein graft to diagonal and OM.  The SVG diagonal fills the LAD.  Also has a stent to the PDA. First was done on him, Daniel [name] really has not had demonstrated any significant angina.  Last heart cath was in 2017. Daniel [name] is still pretty active and denies any anginal symptoms.  Daniel [name] is on longstanding Plavix for CAD but also for Daniel [name] retinal artery occlusion. Because of issues with hypotension in the past, we have split Daniel [name] standard once daily medications into twice daily  Plan: Continue Toprol 50 mg twice daily and lisinopril twice daily but increase morning dose to 10 mg and keep p.m. dose of 5 mg. PRN nitroglycerin which Daniel [name] is not using. Not on statin.  Not anxious and trying to take medications.  Daniel [name] understands risks, but does not want take additional meds. Lifelong Plavix: Okay to hold 5-7 days preop for surgeries or procedures.      Relevant Medications   lisinopril (ZESTRIL) 5 MG tablet   Other Relevant Orders   EKG 12-Lead (Completed)   Essential hypertension (Chronic)    Blood pressure high today.  We have backed off Daniel [name] blood pressure medicine quite a bit recently, but now is pressures running a little high.  I want him to be careful, but I think we probably okay increasing Daniel [name] morning dose of lisinopril to 10 mg and continue 5 mg at night.  We will also continue Daniel [name] twice daily Toprol.  Monitor for signs of hypotension and hold the extra dose if  necessary.      Relevant Medications   lisinopril (ZESTRIL) 5 MG tablet   Other Relevant Orders   EKG 12-Lead (Completed)   Dyslipidemia, goal LDL below 70 (Chronic)    As needed.  Daniel [name] has been intolerant of statins in the past, not interested in taking another medication.  Does not want to do injections.  Not really interested in trying Nexletol.      Relevant Medications   lisinopril (ZESTRIL) 5 MG tablet   Orthostatic hypotension (Chronic)    Thankfully, Daniel [name] has not had any further issues of orthostasis, but we have backed off on Daniel [name] medications.  Now Daniel [name] started become more hypertensive.      Relevant Medications   lisinopril (ZESTRIL) 5 MG tablet     Other   Heart palpitations (Chronic)    Nothing was ever diagnosed, but this is very well controlled with the Toprol 50 mg twice daily.      DOE (dyspnea on exertion) (Chronic)    Mostly now because of deconditioning.  No angina.      Myalgia due to statin (Chronic)    Pretty much refuses to take any statins now.  Refuses to take other lipid-lowering medications for fear of side effects and not wanting to "test them out ".       ===================================  HPI:    Daniel [name] is a 87 y.o. male with a longstanding CAD  history reviewed below who presents today for 34-month follow-up.  CAD History: Previously followed by Dr. Chase Picket. 1997-exertional angina: Cardiac cath showed MV/LM CAD => CABG June 2008 (acute CHF symptoms): PTCA of RPDA Most Recent Cath-March 2017: Atretic LIMA-LAD.  LAD fills via SVG-D2.  SVG-OM and stent in RPDA both patent. CRFs: HTN-but troubled by orthostatic hypotension; HLD Right Retinal Vein Occlusion -> on long-term Plavix  Daniel Chesebro [name] was last seen on 10/08/2020 for preop evaluation for ENT surgery and follow-up from Daniel [name] knee surgery (right total knee arthroplasty in November 2021). ->  Daniel [name] was definitely feeling better since Daniel [name] knee surgery.  Just not yet fully healed.  Still  has some pain.  Also had back spasms.  Working with physical therapy.  Some deconditioning related exertional dyspnea but no chest pain or pressure.  No tachycardia. => Had ENT/sinus surgery scheduled and was considering left knee surgery. -> Still has reduced energy level but gradually getting stronger.  Taking longer than expected to recover.  Gets tired after 2 hours.  Recent Hospitalizations:  Sinus surgery done:November 18, 2020 02/03/2021-[facility], ER: Fall with forehead laceration.  Walking in the yard, stepped awkwardly and lost Daniel [name] balance.  Fell to the ground and hit Daniel [name] forehead.  Abrasions to forearms and forehead.  No LOC. (Normal CT head)=> Wound debrided ,5 sutures placed. 03/08/2021 - [facility]: Presented with accelerated hypertension with blood pressure in the 200s/80s (in setting of sinus infection).  Previously noted elevated blood pressures and-BP med.  Had recent sinus infection as well as steroid injection in left knee.  Also had a seafood dinner, more than usual.  Reviewed  CV studies:    The following studies were reviewed today: (if available, images/films reviewed: From Epic Chart or Care Everywhere) None:  Interval History:   [place] here today for 27-month follow-up overall doing pretty well.Daniel [name] was little bit concerned about the fact that Daniel [name] blood pressure was so high when Daniel [name] went to the emergency room, but has not really had anything unusual besides the fact that Daniel [name] sinus infection and knee pain has been acting up.  Daniel [name] did comment about the fall that Daniel [name] had a couple months ago, but that was simply losing Daniel [name] balance.  Daniel [name] denies any headache or blurred vision.  No lightheadedness or dizziness.  Daniel [name] has not had any cardiac symptoms of angina or CHF or palpitations/arrhythmias.  Actually has been gradually picking up Daniel [name] level exercise.  Daniel [name] certainly is not having a lot of pain in Daniel [name] hips and knees that limit Daniel [name] walking.  Daniel [name] is not sure if  Daniel [name] combination of that plus low back pain which is limiting Daniel [name] activity.  CV Review of Symptoms (Summary) Cardiovascular ROS: positive for - -A little bit of exercise intolerance mostly because of deconditioning, but mostly limited by hip and back pain as well as left knee pain.  Occasional positional dizziness. negative for - chest pain, edema, irregular heartbeat, orthopnea, palpitations, paroxysmal nocturnal dyspnea, rapid heart rate, shortness of breath, or Syncope/near syncope or TIA/amaurosis fugax, claudication.  REVIEWED OF SYSTEMS   Review of Systems  Constitutional:  Positive for malaise/fatigue (Energy level almost back to normal.). Negative for fever and weight loss.  HENT:  Positive for congestion and sinus pain. Negative for nosebleeds.        Recent sinus infection.  Been treated with antibiotics (clindamycin)  Respiratory: Negative.  Negative for cough and shortness of breath.   Cardiovascular:  Positive for leg swelling (Still has some left leg swelling associated with knee pain.).  Gastrointestinal:  Negative for blood in stool and melena.  Genitourinary:  Negative for hematuria.  Musculoskeletal:  Positive for back pain (Low back pain radiating to hips), falls (Daniel [name] had a fall about 2 months ago = the patient lost Daniel [name] footing on uneven ground.) and joint pain (Daniel [name] hips on both sides hurt, also left knee.  Right knee is actually doing better.).       Mild unsteady gait.  Still favors hips and knees.  Left knee is now worse than right.  Neurological:  Positive for dizziness (Mild orthostatic dizziness,  worse in the morning.). Negative for focal weakness and weakness.  Psychiatric/Behavioral:  Positive for memory loss. Negative for depression. The patient is not nervous/anxious and does not have insomnia.    I have reviewed and (if needed) personally updated the patient's problem list, medications, allergies, past medical and surgical history, social and family history.   PAST  MEDICAL HISTORY   Past Medical History:  Diagnosis Date   Arthritis    CAD of autologous arterial graft 2010   Atretic LIMA; Myoview Jan 2015: Possible mild basal anteroseptal defect thought to be artifact (CRO early LAD ischemia due to inadeuate retrograde perfusion via SVG-Diag   CAD S/P percutaneous coronary angioplasty 11/2006   a. 6/'08: PCI nongrafted distal RCA-PDA: Mini vision BMS 2.25 mm x 28 mm; b. Relook Cath 08/2015: Stable CAD. No culprit lesion. Widely patent PDA stent. Patent SVG-OM 2, SVG-D2. Essentially atretic/small LIMA-LAD, but the LAD is perfused via SVG-D2   Complication of anesthesia    Coronary artery disease involving left main coronary artery [id number]   Last cath 2010: 70-80 % ostial left main, 80-90% LAD, subtotal CTO LCx-OM2, RCA patent w/ patent BMS stent (dRCA-PDA); SVG-diagonal backfills LAD, atretic LIMA-LAD -- medical therapy; stable by relook cath in March 2017   Difficult intubation    VERY NARROW AIRWAY PER ANESTHESIOLOGIST (1997 CABG AT [facility])   Dyslipidemia, goal LDL below 70    Dysrhythmia    Heart palpitations    Never fully diagnosed.   History of kidney stones    Hypertension    Insomnia    Mass in chest    JUST WATCHING , WILL CHECK OUT AFTER SURGERY   Pneumonia    S/P CABG x 3 1997   LIMA-LAD, SVG-D2, SVG-OM --> LIMA known to be atretic, but SVG-D2 fills diagonal and LAD   Stenosis of cervical spine with myelopathy ([place])    Tingling    in fingers   Cath 08/2015: Stable from 2010 -> essentially atretic LIMA.  LAD fills via SVG-diagonal       There is no immunization history on file for this patient.   MEDICATIONS/ALLERGIES     PAST SURGICAL HISTORY   Past Surgical History:  Procedure Laterality Date   48-HOUR MONITOR  03/2017   Relatively normal.  Very infrequent ectopy.  Short PAT run with occasional PACs/PVCs and bigeminy.  No symptoms noted on monitor.  Longest PAT runs were less than 10 beats.   BACK SURGERY     decompression   lower back    CARDIAC CATHETERIZATION N/A 08/08/2015   Procedure: Left Heart Cath and Cors/Grafts Angiography;  Surgeon: Leonie Man, MD;  Location: [facility] INVASIVE CV LAB;; -->  LM 80%-70%. Ost LAD 80% then prox-mid 100% (after small D1). Small LCx & OM2 patent with 100% OM2. Prox & distal RCA 20%, patent BMS  in distal RCA-rPDA.  Patent/atretic LIMA-dLAD, SVG-D2 (backdills entire dLAD) & SVG-OM2.    COLONOSCOPY     CORONARY ANGIOPLASTY WITH STENT PLACEMENT  [place] [facility]   BMS PCI of distal RCA-RPDA: PCI with a 2.25 mm x 28 mm Mini Vision bare-metal stent.   CORONARY ARTERY BYPASS GRAFT  [id number]   LIMA - LAD, SVG- diagonal, SVG-OM   EYE SURGERY     BIL CATARACT   KIDNEY STONE SURGERY     KNEE ARTHROSCOPY     RT  KNEE   LEFT HEART CATH AND CORS/GRAFTS ANGIOGRAPHY  11/2006   High-grade distal RCA and PDA stenosis --> BMS PCI.    LEFT HEART CATH AND CORS/GRAFTS ANGIOGRAPHY  2010   Native coronaries at 70% to 80% ostial left main. LAD had diffuse 80% to 90% stenosis   NM MYOVIEW LTD  02/25/2004; Jan 2015   a) EF 57%; b)  Normal Nuclear Stress Test - No evidence of Ischemia or Infarction. Normal LV Function & wall motion.   NM MYOVIEW LTD  03/17/2017   No significant reversible ischemia. Increased TID ratio 1.34. LVEF 54% with normal wall motion. This is a low risk study.   POSTERIOR CERVICAL FUSION/FORAMINOTOMY N/A 07/14/2017   Procedure: LAMINECTOMY CERVICAL THREE- CERVICAL FOUR WITH LATERAL MASS FUSION;  Surgeon: Lisbeth RenshawNundkumar, Neelesh, MD;  Location: [facility] OR;  Service: Neurosurgery;  Laterality: N/A;  LAMINECTOMY CERVICAL 3- CERVICAL 4 WITH LATERAL MASS FUSION   TOTAL KNEE ARTHROPLASTY Right 04/07/2020   Procedure: TOTAL KNEE ARTHROPLASTY;  Surgeon: Dannielle HuhLucey, Steve, MD;  Location: [facility] ORS;  Service: Orthopedics;  Laterality: Right;     There is no immunization history on file for this patient.  MEDICATIONS/ALLERGIES   Current Meds  Medication Sig   acetaminophen (TYLENOL) 500 MG tablet Take 1,000 mg  by mouth every 8 (eight) hours as needed for mild pain.   azelastine (ASTELIN) 0.1 % nasal spray Place 1-2 sprays into both nostrils 2 (two) times daily.   budesonide (RHINOCORT AQUA) 32 MCG/ACT nasal spray Place 1 spray into both nostrils daily.    cholecalciferol (VITAMIN D) 1000 units tablet Take 1,000 Units by mouth daily.   clindamycin (CLEOCIN) 300 MG capsule Take 600 mg by mouth 2 (two) times daily.   clopidogrel (PLAVIX) 75 MG tablet TAKE 1 TABLET(75 MG) BY MOUTH DAILY   docusate sodium (COLACE) 100 MG capsule Take 200-300 mg by mouth daily as needed for mild constipation or moderate constipation.   metoprolol succinate (TOPROL-XL) 50 MG 24 hr tablet Take 1 tablet (50 mg total) by mouth in the morning and at bedtime.   montelukast (SINGULAIR) 10 MG tablet TAKE 1 TABLET(10 MG) BY MOUTH AT BEDTIME   Multiple Vitamin (MULTIVITAMIN WITH MINERALS) TABS tablet Take 1 tablet by mouth daily.   nitroGLYCERIN (NITROSTAT) 0.4 MG SL tablet PLACE 1 TABLET UNDER THE TONGUE EVERY 5 MINUTES AS NEEDED FOR CHEST PAIN FOR 3 DOSES. IF NOT RELIEF AFTER FIRST DOSE, CALL PRESCRIBER OR 911.   tamsulosin (FLOMAX) 0.4 MG CAPS capsule Take 0.4 mg by mouth at bedtime.    traMADol (ULTRAM) 50 MG tablet Take 50-100 mg by mouth 3 (three) times daily as needed.   triamcinolone (NASACORT) 55 MCG/ACT AERO nasal inhaler Place 1 spray into the nose daily.   vitamin B-12 (CYANOCOBALAMIN) 1000 MCG tablet Take 1,000 mcg by mouth 2 (two) times daily.    vitamin C (ASCORBIC ACID) 250 MG tablet Take 250 mg by mouth daily.   zinc gluconate 50 MG tablet  Take 50 mg by mouth in the morning and at bedtime.   zolpidem (AMBIEN) 10 MG tablet Take 10 mg by mouth at bedtime as needed for sleep.   [DISCONTINUED] lisinopril (ZESTRIL) 5 MG tablet Take 1 tablet (5 mg total) by mouth in the morning and at bedtime.    Allergies  Allergen Reactions   Penicillins Swelling and Other (See Comments)    Swelling and redness in joints  Has  patient had a PCN reaction causing immediate rash, facial/tongue/throat swelling, SOB or lightheadedness with hypotension: No Has patient had a PCN reaction causing severe rash involving mucus membranes or skin necrosis: No Has patient had a PCN reaction that required hospitalization: No Has patient had a PCN reaction occurring within the last 10 years: No If all of the above answers are "NO", then may proceed with Cephalosporin use.    Sulfa Antibiotics Other (See Comments)    Pain in left side underneath ribcage-pancreas   Statins Other (See Comments)    UNSPECIFIED REACTION  "INTOLERANCE AT HIGH DOSES"    SOCIAL HISTORY/FAMILY HISTORY   Reviewed in Epic:  Pertinent findings:  Social History   Tobacco Use   Smoking status: Never   Smokeless tobacco: Never  Vaping Use   Vaping Use: Never used  Substance Use Topics   Alcohol use: No   Drug use: No   Social History   Social History Narrative   Daniel [name] is a widowed father of 2, grandfather 5. 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    Daniel [name] wife Cordelia Pen died in 07/07/2019  Daniel [name] now lives alone in a one story home.   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  Daniel [name] is usually very active, but is limited as far as standing exercise by Daniel [name] knee osteoarthritis.   Daniel [name] does not drink and does not smoke. Never smoked.   Education: college.     OBJCTIVE -PE, EKG, labs   Wt Readings from Last 3 Encounters:  04/27/21 197 lb (89.4 kg)  02/03/21 188 lb (85.3 kg)  10/08/20 194 lb 9.6 oz (88.3 kg)    Physical Exam: BP (!) 160/80 (BP Location: Left Arm)   Pulse 69   Ht 6\' 3"  (1.905 m)   Wt 197 lb (89.4 kg)   SpO2 96%   BMI 24.62 kg/m  Physical Exam Vitals reviewed.  Constitutional:      General: Daniel [name] is not in acute distress.    Appearance: Normal appearance. Daniel [name] is normal weight. Daniel [name] is not ill-appearing or toxic-appearing.     Comments: Remarkably healthy  appearing.  Well-nourished and well-groomed.  Very tall, but not excessively thin.  Mild antalgic gait.  HENT:     Head: Normocephalic and atraumatic.  Neck:     Vascular: No carotid bruit or JVD.  Cardiovascular:     Rate and Rhythm: Normal rate and regular rhythm. No extrasystoles are present.    Chest Wall: PMI is not displaced.     Pulses: Intact distal pulses.     Heart sounds: S1 normal and S2 normal. No murmur heard.   No friction rub. No gallop.  Pulmonary:     Effort: Pulmonary effort is normal. No respiratory distress.     Breath sounds: Normal breath sounds.  Chest:     Chest wall: No tenderness.  Musculoskeletal:        General: Swelling (Trivial left leg/ankle) present.  Cervical back: Normal range of motion and neck supple.  Skin:    General: Skin is warm and dry.  Neurological:     General: No focal deficit present.     Mental Status: Daniel [name] is alert and oriented to person, place, and time.     Gait: Gait abnormal.  Psychiatric:        Mood and Affect: Mood normal.        Thought Content: Thought content normal.        Judgment: Judgment normal.     Adult ECG Report  Rate: 69;  Rhythm: normal sinus rhythm and normal axis, intervals durations. ;   Narrative Interpretation: Stable/normal  Recent Labs: Reviewed 01/26/2021: TC 178, TG 191, HDL 33, LDL 107**  Lab Results  Component Value Date   CREATININE 0.88 03/08/2021   BUN 29 (H) 03/08/2021   NA 140 03/08/2021   K 3.9 03/08/2021   CL 104 03/08/2021   CO2 29 03/08/2021   CBC Latest Ref Rng & Units 03/08/2021 02/03/2021 04/03/2020  WBC 4.0 - 10.5 K/uL 11.3(H) 7.2 7.8  Hemoglobin 13.0 - 17.0 g/dL 13.3 13.4 14.3  Hematocrit 39.0 - 52.0 % 40.4 41.2 43.1  Platelets 150 - 400 K/uL 241 210 254    Lab Results  Component Value Date   TSH 0.20 (L) 05/02/2017    ==================================================  COVID-19 Education: The signs and symptoms of COVID-19 were discussed with the patient and how  to seek care for testing (follow up with PCP or arrange E-visit).    I spent a total of 70minutes with the patient spent in direct patient consultation.  Additional time spent with chart review  / charting (studies, outside notes, etc): 17 min Total Time: 42 min  Current medicines are reviewed at length with the patient today.  (+/- concerns) Recommended taking lisinopril agent by splitting twice a day 10 AM and 5 PM  This visit occurred during the SARS-CoV-2 public health emergency.  Safety protocols were in place, including screening questions prior to the visit, additional usage of staff PPE, and extensive cleaning of exam room while observing appropriate contact time as indicated for disinfecting solutions.  Notice: This dictation was prepared with Dragon dictation along with smart phrase technology. Any transcriptional errors that result from this process are unintentional and may not be corrected upon review.  Studies Ordered:   Orders Placed This Encounter  Procedures   EKG 12-Lead    Patient Instructions / Medication Changes & Studies & Tests Ordered   Patient Instructions  Medication Instructions:    INCREASE MORNING DOSE OF LISINOPRIL TO 10 MG ( 2 TABLETS OF 5 MG)   AND TAKE 5 MG IN THE EVENING  IF   SUSTAINED SYSTOLIC BLOOD PRESSURE GREATER 180/90  TAKE AN EXTRA 5 MG LISINOPRIL  +  ALONG WITH TAKING  NTG  SUBLINGUAL TABLET    *If you need a refill on your cardiac medications before your next appointment, please call your pharmacy*   Lab Work:  NOT NEEDED    Testing/Procedures: NOT NEEDED  Follow-Up: At [facility], you and your health needs are our priority.  As part of our continuing mission to provide you with exceptional heart care, we have created designated Provider Care Teams.  These Care Teams include your primary Cardiologist (physician) and Advanced Practice Providers (APPs -  Physician Assistants and Nurse Practitioners) who all work together to provide  you with the care you need, when you need it.  Your next appointment:   6 month(s)  The format for your next appointment:   In Person  Provider:   Glenetta Hew, MD    Other Instructions      Glenetta Hew, M.D., M.S. Interventional Cardiologist   Pager # [phone] Phone # [phone] [address] [place], [place] [address]   Thank you for choosing Heartcare at [facility]!!

## 2021-04-27 NOTE — Patient Instructions (Addendum)
Medication Instructions:    INCREASE MORNING DOSE OF LISINOPRIL TO 10 MG ( 2 TABLETS OF 5 MG)   AND TAKE 5 MG IN THE EVENING  IF   SUSTAINED SYSTOLIC BLOOD PRESSURE GREATER 180/90  TAKE AN EXTRA 5 MG LISINOPRIL  +  ALONG WITH TAKING  NTG  SUBLINGUAL TABLET    *If you need a refill on your cardiac medications before your next appointment, please call your pharmacy*   Lab Work:  NOT NEEDED    Testing/Procedures: NOT NEEDED  Follow-Up: At [facility], you and your health needs are our priority.  As part of our continuing mission to provide you with exceptional heart care, we have created designated Provider Care Teams.  These Care Teams include your primary Cardiologist (physician) and Advanced Practice Providers (APPs -  Physician Assistants and Nurse Practitioners) who all work together to provide you with the care you need, when you need it.     Your next appointment:   6 month(s)  The format for your next appointment:   In Person  Provider:   Bryan Lemma, MD    Other Instructions

## 2021-05-12 ENCOUNTER — Encounter: Payer: Self-pay | Admitting: Physical Medicine and Rehabilitation

## 2021-05-12 ENCOUNTER — Other Ambulatory Visit: Payer: Self-pay

## 2021-05-12 ENCOUNTER — Ambulatory Visit: Payer: Medicare Other | Admitting: Physical Medicine and Rehabilitation

## 2021-05-12 VITALS — BP 148/79 | HR 77

## 2021-05-12 DIAGNOSIS — G8929 Other chronic pain: Secondary | ICD-10-CM

## 2021-05-12 DIAGNOSIS — M47816 Spondylosis without myelopathy or radiculopathy, lumbar region: Secondary | ICD-10-CM

## 2021-05-12 DIAGNOSIS — M961 Postlaminectomy syndrome, not elsewhere classified: Secondary | ICD-10-CM

## 2021-05-12 DIAGNOSIS — M545 Low back pain, unspecified: Secondary | ICD-10-CM | POA: Diagnosis not present

## 2021-05-12 DIAGNOSIS — M48061 Spinal stenosis, lumbar region without neurogenic claudication: Secondary | ICD-10-CM

## 2021-05-12 NOTE — Progress Notes (Signed)
Daniel Dudoit [name] - 87 y.o. male MRN [id number]  Date of birth: March 21, 1933  Office Visit Note: Visit Date: 05/12/2021 PCP: Geoffry Paradise, MD Referred by: Geoffry Paradise, MD  Subjective: Chief Complaint  Patient presents with   Lower Back - Pain   HPI: Daniel [name] is a 87 y.o. male who comes in today per the request of Dr. Doneen Poisson for evaluation of bilateral lower back pain. Patient reports pain has been ongoing for several months.  Patient reports pain is exacerbated by prolonged standing and walking, describes his pain as a soreness sensation and currently rates as 8 out of 10.  Patient states some relief of pain with sitting lying down and intermittent use of tramadol.  Patient recently saw Dr. Doneen Poisson for evaluation of bilateral hip pain.  Dr. Magnus Ivan obtain bilateral hip x-rays that exhibits normal-appearing hips and states in his notes that he believes his lower back is the source of his pain.  Patient's recent lumbar x-rays show multilevel degenerative changes.  Patient did have a myelogram of his lumbar spine in 2010 which shows multifactorial final canal stenosis and 2 mm anterolisthesis at L4-L5.  He does not have any recent MRI imaging of his lumbar spine.  Patient states he is a very active person and does perform home exercises when able, he also reports that he does frequently perform yard work and enjoys gardening. Patient does have a history of central decompressive laminectomy L4-L5 in 2010. Patient states he had multiple lumbar epidural injections performed at [facility] prior to surgery and states that these injections did help to alleviate his pain, however pain relief from short lived. Patent also has a history of chronic bilateral knee pain, he did have a right total knee replacement performed by Dr. Georgena Spurling at [facility] in 2021.  Patient states he had plan to have left total knee replacement however he was  unable to proceed due to caring for his chronically ill wife who later passed.  Patient is currently using a cane to assist with ambulation and to prevent falls.  Patient denies focal weakness, numbness and tingling.  Patient does voice a recent fall in August that occurred while he was outside performing yard work, patient suffered laceration to forehead and was evaluated in the emergency department where no life-threatening injuries were reported.  Patient does have a significant cardiac history that includes coronary artery disease, unstable angina, hypertension, history of coronary artery bypass graft x3 and long-term anticoagulation therapy with Plavix. Patient is currently being managed at [facility] by Dr. Bryan Lemma.   Review of Systems  Musculoskeletal:  Positive for back pain.  Neurological:  Negative for tingling, sensory change, focal weakness and weakness.  All other systems reviewed and are negative. Otherwise per HPI.  Assessment & Plan: Visit Diagnoses:    ICD-10-CM   1. Chronic bilateral low back pain without sciatica  M54.50 MR LUMBAR SPINE WO CONTRAST   G89.29     2. Facet hypertrophy of lumbar region  M47.816     3. Spondylosis without myelopathy or radiculopathy, lumbar region  M47.816 MR LUMBAR SPINE WO CONTRAST    4. Spinal stenosis of lumbar region without neurogenic claudication  M48.061     5. Post laminectomy syndrome  M96.1        Plan: Findings:  Chronic, worsening and severe bilateral lower back pain.  Patient continues to have pain despite good conservative therapy such as home exercise program,  rest and use of medications.  Patient does take tramadol intermittently for severe pain.  Patient's clinical presentation and exam are consistent with facet mediated pain.  He currently does not have recent MRI imaging of his lumbar spine, his lumbar myelogram from 2010 does show multifactorial spinal canal stenosis at L4-L5.  We believe the next step is to  obtain lumbar MRI imaging.  Patient voices concern about anxiety related to procedure and is requesting to have an open MRI performed.  Patient is requesting to go to [facility] as he has had previous MRIs at this facility.  We will follow-up with patient after lumbar MRI imaging is obtained to discuss further treatment options.  We did discuss possibility of performing a lumbar epidural steroid injection/facet joint injections depending on the results of his MRI.  Patient encouraged to remain active and continue home exercise regimen as tolerated.  Patient instructed to continue using cane to assist with ambulation and prevent falls.  No red flag symptoms noted upon exam today.   Meds & Orders: No orders of the defined types were placed in this encounter.   Orders Placed This Encounter  Procedures   MR LUMBAR SPINE WO CONTRAST    Follow-up: Return for re-evaluation after lumbar MRI imaging is obtained.   Procedures: No procedures performed      Clinical History: Electrodiagnostic study of both upper limbs dated 05/13/2010 showed normal electrodiagnostic study   He reports that he has never smoked. He has never used smokeless tobacco. No results for input(s): HGBA1C, LABURIC in the last 8760 hours.  Objective:  VS:  HT:    WT:   BMI:     BP:(!) 148/79  HR:77bpm  TEMP: ( )  RESP:  Physical Exam Vitals and nursing note reviewed.  HENT:     Head: Normocephalic and atraumatic.     Right Ear: External ear normal.     Left Ear: External ear normal.     Nose: Nose normal.     Mouth/Throat:     Mouth: Mucous membranes are moist.  Eyes:     Extraocular Movements: Extraocular movements intact.  Cardiovascular:     Rate and Rhythm: Normal rate.     Pulses: Normal pulses.  Pulmonary:     Effort: Pulmonary effort is normal.  Abdominal:     General: Abdomen is flat. There is no distension.  Musculoskeletal:        General: Swelling present.     Cervical back: Normal  range of motion.     Comments: Pt is slow to rise from seated position to standing. Concordant low back pain with facet loading, lumbar spine extension and rotation. Strong distal strength without clonus, no pain upon palpation of greater trochanters. Sensation intact bilaterally. Ambulates with walker, gait is unsteady and slow.    Skin:    General: Skin is warm and dry.     Capillary Refill: Capillary refill takes less than 2 seconds.  Neurological:     Mental Status: He is alert and oriented to person, place, and time.     Gait: Gait abnormal.  Psychiatric:        Mood and Affect: Mood normal.    Ortho Exam  Imaging: No results found.  Past Medical/Family/Surgical/Social History: Medications & Allergies reviewed per EMR, new medications updated. Patient Active Problem List   Diagnosis Date Noted   Stenosis of cervical spine with myelopathy ([place]) 07/14/2017   DOE (dyspnea on exertion) 03/10/2017   Acute  maxillary sinusitis 02/15/2017   Allergic rhinitis 02/15/2017   Preoperative cardiovascular examination 08/16/2016   Chronic pain of both knees 07/13/2016   Fatigue due to treatment 09/11/2015   Unstable angina (HCC) 08/06/2015   Low TSH level 08/13/2014   Orthostatic hypotension 12/17/2013   Pseudoptosis 05/22/2013   Dermatochalasis 02/21/2013   Epiphora 02/21/2013   S/P CABG x 3    Essential hypertension    Dyslipidemia, goal LDL below 70    Heart palpitations    Nuclear cataract 01/26/2012   PCO (posterior capsular opacification) 01/26/2012   Pseudophakia 01/26/2012   LM-CAD - atretic LIMA-LAD, patent SVG-D2-> fills LAD & SVG-OM, BMS PCI rPDA 07/27/1995   Past Medical History:  Diagnosis Date   Arthritis    CAD of autologous arterial graft 2010   Atretic LIMA; Myoview Jan 2015: Possible mild basal anteroseptal defect thought to be artifact (CRO early LAD ischemia due to inadeuate retrograde perfusion via SVG-Diag   CAD S/P percutaneous coronary angioplasty 11/2006    a. 6/'08: PCI nongrafted distal RCA-PDA: Mini vision BMS 2.25 mm x 28 mm; b. Relook Cath 08/2015: Stable CAD. No culprit lesion. Widely patent PDA stent. Patent SVG-OM 2, SVG-D2. Essentially atretic/small LIMA-LAD, but the LAD is perfused via SVG-D2   Complication of anesthesia    Coronary artery disease involving left main coronary artery [id number]   Last cath 2010: 70-80 % ostial left main, 80-90% LAD, subtotal CTO LCx-OM2, RCA patent w/ patent BMS stent (dRCA-PDA); SVG-diagonal backfills LAD, atretic LIMA-LAD -- medical therapy; stable by relook cath in March 2017   Difficult intubation    VERY NARROW AIRWAY PER ANESTHESIOLOGIST (1997 CABG AT [facility])   Dyslipidemia, goal LDL below 70    Dysrhythmia    Heart palpitations    Never fully diagnosed.   History of kidney stones    Hypertension    Insomnia    Mass in chest    JUST WATCHING , WILL CHECK OUT AFTER SURGERY   Pneumonia    S/P CABG x 3 1997   LIMA-LAD, SVG-D2, SVG-OM --> LIMA known to be atretic, but SVG-D2 fills diagonal and LAD   Stenosis of cervical spine with myelopathy (HCC)    Tingling    in fingers   Family History  Problem Relation Age of Onset   Stroke Mother 66   Heart disease Mother    Arthritis Mother    Heart attack Father 21   Other Sister    Heart disease Brother    Allergic rhinitis Neg Hx    Atopy Neg Hx    Angioedema Neg Hx    Asthma Neg Hx    Eczema Neg Hx    Immunodeficiency Neg Hx    Urticaria Neg Hx    Past Surgical History:  Procedure Laterality Date   48-HOUR MONITOR  03/2017   Relatively normal.  Very infrequent ectopy.  Short PAT run with occasional PACs/PVCs and bigeminy.  No symptoms noted on monitor.  Longest PAT runs were less than 10 beats.   BACK SURGERY     decompression  lower back    CARDIAC CATHETERIZATION N/A 08/08/2015   Procedure: Left Heart Cath and Cors/Grafts Angiography;  Surgeon: Marykay Lex, MD;  Location: [facility] INVASIVE CV LAB;; -->  LM 80%-70%. Ost LAD 80% then prox-mid  100% (after small D1). Small LCx & OM2 patent with 100% OM2. Prox & distal RCA 20%, patent BMS in distal RCA-rPDA.  Patent/atretic LIMA-dLAD, SVG-D2 (backdills entire dLAD) & SVG-OM2.  COLONOSCOPY     CORONARY ANGIOPLASTY WITH STENT PLACEMENT  Opal Sidles 2008   BMS PCI of distal RCA-RPDA: PCI with a 2.25 mm x 28 mm Mini Vision bare-metal stent.   CORONARY ARTERY BYPASS GRAFT  [id number]   LIMA - LAD, SVG- diagonal, SVG-OM   EYE SURGERY     BIL CATARACT   KIDNEY STONE SURGERY     KNEE ARTHROSCOPY     RT  KNEE   LEFT HEART CATH AND CORS/GRAFTS ANGIOGRAPHY  11/2006   High-grade distal RCA and PDA stenosis --> BMS PCI.    LEFT HEART CATH AND CORS/GRAFTS ANGIOGRAPHY  2010   Native coronaries at 70% to 80% ostial left main. LAD had diffuse 80% to 90% stenosis   NM MYOVIEW LTD  02/25/2004; Jan 2015   a) EF 57%; b)  Normal Nuclear Stress Test - No evidence of Ischemia or Infarction. Normal LV Function & wall motion.   NM MYOVIEW LTD  03/17/2017   No significant reversible ischemia. Increased TID ratio 1.34. LVEF 54% with normal wall motion. This is a low risk study.   POSTERIOR CERVICAL FUSION/FORAMINOTOMY N/A 07/14/2017   Procedure: LAMINECTOMY CERVICAL THREE- CERVICAL FOUR WITH LATERAL MASS FUSION;  Surgeon: Consuella Lose, MD;  Location: [place];  Service: Neurosurgery;  Laterality: N/A;  LAMINECTOMY CERVICAL 3- CERVICAL 4 WITH LATERAL MASS FUSION   TOTAL KNEE ARTHROPLASTY Right 04/07/2020   Procedure: TOTAL KNEE ARTHROPLASTY;  Surgeon: Vickey Huger, MD;  Location: [facility];  Service: Orthopedics;  Laterality: Right;   Social History   Occupational History   Occupation: retired Programme researcher, broadcasting/film/video  Tobacco Use   Smoking status: Never   Smokeless tobacco: Never  Vaping Use   Vaping Use: Never used  Substance and Sexual Activity   Alcohol use: No   Drug use: No   Sexual activity: Not on file

## 2021-05-12 NOTE — Progress Notes (Signed)
States pain is in the center of lower back. States feels like is radiates into his hips. Has neuropathy. Feels like it is affecting the way he walks History of surgery by Dr. Otelia Sergeant and shots by Dr. Alvester Morin. [facility] that caused his BP to spike  Numeric Pain Rating Scale and Functional Assessment Average Pain 6 Pain Right Now 1 My pain is constant Pain is worse with: walking and standing Pain improves with: rest   In the last MONTH (on 0-10 scale) has pain interfered with the following?  1. General activity like being  able to carry out your everyday physical activities such as walking, climbing stairs, carrying groceries, or moving a chair?  Rating(7)  2. Relation with others like being able to carry out your usual social activities and roles such as  activities at home, at work and in your community. Rating(7)  3. Enjoyment of life such that you have  been bothered by emotional problems such as feeling anxious, depressed or irritable?  Rating(7)

## 2021-05-22 ENCOUNTER — Encounter: Payer: Self-pay | Admitting: Cardiology

## 2021-05-22 DIAGNOSIS — M791 Myalgia, unspecified site: Secondary | ICD-10-CM | POA: Insufficient documentation

## 2021-05-22 DIAGNOSIS — T466X5A Adverse effect of antihyperlipidemic and antiarteriosclerotic drugs, initial encounter: Secondary | ICD-10-CM | POA: Insufficient documentation

## 2021-05-22 NOTE — Assessment & Plan Note (Signed)
Pretty much refuses to take any statins now.  Refuses to take other lipid-lowering medications for fear of side effects and not wanting to "test them out ".

## 2021-05-22 NOTE — Assessment & Plan Note (Signed)
Longstanding history of CAD-CABG with atretic LIMA, patent vein graft to diagonal and OM.  The SVG diagonal fills the LAD.  Also has a stent to the PDA. First was done on him, he really has not had demonstrated any significant angina.  Last heart cath was in 2017. He is still pretty active and denies any anginal symptoms.  He is on longstanding Plavix for CAD but also for his retinal artery occlusion. Because of issues with hypotension in the past, we have split his standard once daily medications into twice daily  Plan:  Continue Toprol 50 mg twice daily and lisinopril twice daily but increase morning dose to 10 mg and keep p.m. dose of 5 mg.  PRN nitroglycerin which he is not using.  Not on statin.  Not anxious and trying to take medications.  He understands risks, but does not want take additional meds.  Lifelong Plavix: Okay to hold 5-7 days preop for surgeries or procedures.

## 2021-05-22 NOTE — Assessment & Plan Note (Addendum)
Thankfully, he has not had any further issues of orthostasis, but we have backed off on his medications.  Now he started become more hypertensive.

## 2021-05-22 NOTE — Assessment & Plan Note (Signed)
As needed.  He has been intolerant of statins in the past, not interested in taking another medication.  Does not want to do injections.  Not really interested in trying Nexletol.

## 2021-05-22 NOTE — Assessment & Plan Note (Signed)
Mostly now because of deconditioning.  No angina.

## 2021-05-22 NOTE — Assessment & Plan Note (Addendum)
Blood pressure high today.  We have backed off his blood pressure medicine quite a bit recently, but now is pressures running a little high.  I want him to be careful, but I think we probably okay increasing his morning dose of lisinopril to 10 mg and continue 5 mg at night.  We will also continue his twice daily Toprol.  Monitor for signs of hypotension and hold the extra dose if necessary.

## 2021-05-22 NOTE — Assessment & Plan Note (Signed)
Nothing was ever diagnosed, but this is very well controlled with the Toprol 50 mg twice daily.

## 2021-06-11 ENCOUNTER — Telehealth: Payer: Self-pay | Admitting: Physical Medicine and Rehabilitation

## 2021-06-12 NOTE — Telephone Encounter (Signed)
See comments  

## 2021-06-15 ENCOUNTER — Telehealth: Payer: Self-pay | Admitting: Cardiology

## 2021-06-15 NOTE — Telephone Encounter (Signed)
Attempted to reach the patient. Was unable to leave a message 

## 2021-06-15 NOTE — Telephone Encounter (Signed)
Pt c/o medication issue:  1. Name of Medication: lisinopril (ZESTRIL) 5 MG tablet  2. How are you currently taking this medication (dosage and times per day)? 2 tablets in morning and 1 at night  3. Are you having a reaction (difficulty breathing--STAT)? no  4. What is your medication issue? Patient states the pharmacy won't fill the prescription. He states they will fill it for a 10 mg tablet if a prescription is sent to them.

## 2021-06-17 ENCOUNTER — Other Ambulatory Visit: Payer: Self-pay | Admitting: Cardiology

## 2021-06-22 NOTE — Telephone Encounter (Signed)
Unable to reach pt or leave a message  

## 2021-07-01 ENCOUNTER — Ambulatory Visit: Payer: Self-pay

## 2021-07-01 ENCOUNTER — Other Ambulatory Visit: Payer: Self-pay

## 2021-07-01 ENCOUNTER — Ambulatory Visit: Payer: Medicare Other | Admitting: Physical Medicine and Rehabilitation

## 2021-07-01 ENCOUNTER — Encounter: Payer: Self-pay | Admitting: Physical Medicine and Rehabilitation

## 2021-07-01 VITALS — BP 138/85 | HR 80

## 2021-07-01 DIAGNOSIS — M47816 Spondylosis without myelopathy or radiculopathy, lumbar region: Secondary | ICD-10-CM

## 2021-07-01 MED ORDER — BUPIVACAINE HCL 0.5 % IJ SOLN
3.0000 mL | Freq: Once | INTRAMUSCULAR | Status: AC
  Administered 2021-07-01: 3 mL

## 2021-07-01 NOTE — Progress Notes (Signed)
Pt state lower back pain. Pt state walking and standing makes the pain worse. Pt state he takes pain meds to help ease his pain.  Numeric Pain Rating Scale and Functional Assessment Average Pain 3   In the last MONTH (on 0-10 scale) has pain interfered with the following?  1. General activity like being  able to carry out your everyday physical activities such as walking, climbing stairs, carrying groceries, or moving a chair?  Rating(8)   +Driver, +BT, -Dye Allergies.

## 2021-07-01 NOTE — Patient Instructions (Signed)

## 2021-07-03 NOTE — Telephone Encounter (Signed)
Attempted to reach the patient. Was unable to leave a message 

## 2021-07-09 ENCOUNTER — Telehealth: Payer: Self-pay | Admitting: Physical Medicine and Rehabilitation

## 2021-07-09 NOTE — Telephone Encounter (Signed)
Pt called and states he had an injection on 07/01/2021 for bilateral L4-5 and L5-S1 facet medial branch blocks. He said he turned the diary report in and was suppose to be waiting for a call to repeat injections. No one has called pt. Please advise.   CB [phone]

## 2021-07-14 NOTE — Progress Notes (Signed)
Daniel [name] - 88 y.o. male MRN [id number]  Date of birth: 1933-01-08  Office Visit Note: Visit Date: 07/01/2021 PCP: Burnard Bunting, MD Referred by: Burnard Bunting, MD  Subjective: Chief Complaint  Patient presents with   Lower Back - Pain   HPI:  Daniel [name] is a 88 y.o. male who comes in today at the request of Barnet Pall, FNP for planned Bilateral  L4-5 and L5-S1 Lumbar facet/medial branch block with fluoroscopic guidance.  The patient has failed conservative care including home exercise, medications, time and activity modification.  This injection will be diagnostic and hopefully therapeutic.  Please see requesting physician notes for further details and justification.  Exam has shown concordant pain with facet joint loading. MRI reviewed with images and spine model.  MRI reviewed in the note below.   ROS Otherwise per HPI.  Assessment & Plan: Visit Diagnoses:    ICD-10-CM   1. Facet hypertrophy of lumbar region  M47.816 XR C-ARM NO REPORT    Facet Injection    bupivacaine (MARCAINE) 0.5 % (with pres) injection 3 mL    2. Spondylosis without myelopathy or radiculopathy, lumbar region  M47.816 XR C-ARM NO REPORT    Facet Injection    bupivacaine (MARCAINE) 0.5 % (with pres) injection 3 mL      Plan: No additional findings.   Meds & Orders:  Meds ordered this encounter  Medications   bupivacaine (MARCAINE) 0.5 % (with pres) injection 3 mL    Orders Placed This Encounter  Procedures   Facet Injection   XR C-ARM NO REPORT    Follow-up: Return for Review Pain Diary.   Procedures: No procedures performed  Lumbar Diagnostic Facet Joint Nerve Block with Fluoroscopic Guidance   Patient: Daniel [name]      Date of Birth: Oct 28, 1932 MRN: [id number] PCP: Burnard Bunting, MD      Visit Date: 07/01/2021   Universal Protocol:    Date/Time: 02/07/238:16 AM  Consent Given By: the patient  Position: PRONE  Additional Comments: Vital signs were monitored  before and after the procedure. Patient was prepped and draped in the usual sterile fashion. The correct patient, procedure, and site was verified.   Injection Procedure Details:   Procedure diagnoses:  1. Facet hypertrophy of lumbar region   2. Spondylosis without myelopathy or radiculopathy, lumbar region      Meds Administered:  Meds ordered this encounter  Medications   bupivacaine (MARCAINE) 0.5 % (with pres) injection 3 mL     Laterality: Bilateral  Location/Site: L4-L5, L3 and L4 medial branches and L5-S1, L4 medial branch and L5 dorsal ramus  Needle: 5.0 in., 25 ga.  Short bevel or Quincke spinal needle  Needle Placement: Oblique pedical  Findings:   -Comments: There was excellent flow of contrast along the articular pillars without intravascular flow.  Procedure Details: The fluoroscope beam is vertically oriented in AP and then obliqued 15 to 20 degrees to the ipsilateral side of the desired nerve to achieve the Scotty dog appearance.  The skin over the target area of the junction of the superior articulating process and the transverse process (sacral ala if blocking the L5 dorsal rami) was locally anesthetized with a 1 ml volume of 1% Lidocaine without Epinephrine.  The spinal needle was inserted and advanced in a trajectory view down to the target.   After contact with periosteum and negative aspirate for blood and CSF, correct placement without intravascular or epidural spread was confirmed by injecting 0.5  ml. of Isovue-250.  A spot radiograph was obtained of this image.    Next, a 0.5 ml. volume of the injectate described above was injected. The needle was then redirected to the other facet joint nerves mentioned above if needed.  Prior to the procedure, the patient was given a Pain Diary which was completed for baseline measurements.  After the procedure, the patient rated their pain every 30 minutes and will continue rating at this frequency for a total of 5  hours.  The patient has been asked to complete the Diary and return to [place] by mail, fax or hand delivered as soon as possible.   Additional Comments:  The patient tolerated the procedure well Dressing: 2 x 2 sterile gauze and Band-Aid    Post-procedure details: Patient was observed during the procedure. Post-procedure instructions were reviewed.  Patient left the clinic in stable condition.    Clinical History: MRI lumbar spine:   TECHNIQUE: Sagittal and axial T1 and T2-weighted sequences were performed. Additional sagittal STIR images were performed.   INDICATION: Back pain   COMPARISON: None   FINDINGS:  #  5 mm grade 1 anterolisthesis L4-5 with minimal grade 1 anterolisthesis is L5-S1.  #  Vertebral body heights are well maintained.  #  Trace type stress I Modic endplate change [id number] and type II at L4-5  #  Conus terminates at T12-L1 without evidence of tethering.  #  Nerve roots appear normal.  #  Incidental findings: None.    #  L1-2: Mild degenerative disc disease. Canal is well-maintained. Neuroforamina patent.  #    #  L2-3: Mild degenerative disc disease. Central canal is well-maintained. Neural foramina are patent.  #    #  L3-4: Mild degenerative disc disease. There is facet and disc bulge causing mild central canal stenosis with mild bilateral foraminal narrowing.  #    #  L4-5: Severe degenerative disc disease. Grade 1 anterolisthesis and laminectomy decompression with facet arthropathy and disc uncovering causing mild central canal and lateral recess stenosis and moderate bilateral foraminal narrowing  #    #  L5-S1: Grade 1 anterolisthesis. Facet arthropathy and disc bulge causes mild central canal stenosis and mild bilateral foraminal narrowing.    IMPRESSION:   1.  L4-5 severe degenerative disc disease with grade 1 anterolisthesis and laminectomy decompression. There is mild residual central canal lateral recess stenosis.   2.  L5-S1 minimal grade 1  anterolisthesis contributes to mild central canal stenosis   Electronically Signed by: Ritta Slot on 06/10/2021 1:58 PM ----  Electrodiagnostic study of both upper limbs dated 05/13/2010 showed normal electrodiagnostic study     Objective:  VS:  HT:     WT:    BMI:      BP:138/85   HR:80bpm   TEMP: ( )   RESP:  Physical Exam Vitals and nursing note reviewed.  Constitutional:      General: He is not in acute distress.    Appearance: Normal appearance. He is not ill-appearing.  HENT:     Head: Normocephalic and atraumatic.     Right Ear: External ear normal.     Left Ear: External ear normal.     Nose: No congestion.  Eyes:     Extraocular Movements: Extraocular movements intact.  Cardiovascular:     Rate and Rhythm: Normal rate.     Pulses: Normal pulses.  Pulmonary:     Effort: Pulmonary effort is normal. No respiratory distress.  Abdominal:  General: There is no distension.     Palpations: Abdomen is soft.  Musculoskeletal:        General: No tenderness or signs of injury.     Cervical back: Neck supple.     Right lower leg: No edema.     Left lower leg: No edema.     Comments: Patient has good distal strength without clonus. Patient somewhat slow to rise from a seated position to full extension.  There is concordant low back pain with facet loading and lumbar spine extension rotation.  There are no definitive trigger points but the patient is somewhat tender across the lower back and PSIS.  There is no pain with hip rotation.   Skin:    Findings: No erythema or rash.  Neurological:     General: No focal deficit present.     Mental Status: He is alert and oriented to person, place, and time.     Sensory: No sensory deficit.     Motor: No weakness or abnormal muscle tone.     Coordination: Coordination normal.  Psychiatric:        Mood and Affect: Mood normal.        Behavior: Behavior normal.     Imaging: No results found.

## 2021-07-14 NOTE — Procedures (Signed)
Lumbar Diagnostic Facet Joint Nerve Block with Fluoroscopic Guidance   Patient: Daniel [name]      Date of Birth: 1933/03/11 MRN: [id number] PCP: Geoffry Paradise, MD      Visit Date: 07/01/2021   Universal Protocol:    Date/Time: 02/07/238:16 AM  Consent Given By: the patient  Position: PRONE  Additional Comments: Vital signs were monitored before and after the procedure. Patient was prepped and draped in the usual sterile fashion. The correct patient, procedure, and site was verified.   Injection Procedure Details:   Procedure diagnoses:  1. Facet hypertrophy of lumbar region   2. Spondylosis without myelopathy or radiculopathy, lumbar region      Meds Administered:  Meds ordered this encounter  Medications   bupivacaine (MARCAINE) 0.5 % (with pres) injection 3 mL     Laterality: Bilateral  Location/Site: L4-L5, L3 and L4 medial branches and L5-S1, L4 medial branch and L5 dorsal ramus  Needle: 5.0 in., 25 ga.  Short bevel or Quincke spinal needle  Needle Placement: Oblique pedical  Findings:   -Comments: There was excellent flow of contrast along the articular pillars without intravascular flow.  Procedure Details: The fluoroscope beam is vertically oriented in AP and then obliqued 15 to 20 degrees to the ipsilateral side of the desired nerve to achieve the Scotty dog appearance.  The skin over the target area of the junction of the superior articulating process and the transverse process (sacral ala if blocking the L5 dorsal rami) was locally anesthetized with a 1 ml volume of 1% Lidocaine without Epinephrine.  The spinal needle was inserted and advanced in a trajectory view down to the target.   After contact with periosteum and negative aspirate for blood and CSF, correct placement without intravascular or epidural spread was confirmed by injecting 0.5 ml. of Isovue-250.  A spot radiograph was obtained of this image.    Next, a 0.5 ml. volume of the injectate  described above was injected. The needle was then redirected to the other facet joint nerves mentioned above if needed.  Prior to the procedure, the patient was given a Pain Diary which was completed for baseline measurements.  After the procedure, the patient rated their pain every 30 minutes and will continue rating at this frequency for a total of 5 hours.  The patient has been asked to complete the Diary and return to [place] by mail, fax or hand delivered as soon as possible.   Additional Comments:  The patient tolerated the procedure well Dressing: 2 x 2 sterile gauze and Band-Aid    Post-procedure details: Patient was observed during the procedure. Post-procedure instructions were reviewed.  Patient left the clinic in stable condition.

## 2021-07-17 ENCOUNTER — Telehealth: Payer: Self-pay | Admitting: *Deleted

## 2021-07-17 MED ORDER — LISINOPRIL 10 MG PO TABS: ORAL_TABLET | ORAL | 3 refills | Status: DC

## 2021-07-17 NOTE — Telephone Encounter (Signed)
Received  message from patient in regards to  5 mg dose of lisinopril  will not be filled by insurance. ( Total of 3 tablets of 5 mg  lisinopril)   Per Dr Ellyn Hack   changed prescription to 10 mg tablet     Take 10 mg  in the morning and  1/2 tablet ( 5 mg)  in the evening.  New prescription  sent to pharmacy  Attempted to contact patient by phone - no way to leave message on voicemail or answer machine.   Sent message by [place] patient new prescription was sent to pharmacy

## 2021-07-27 ENCOUNTER — Ambulatory Visit (INDEPENDENT_AMBULATORY_CARE_PROVIDER_SITE_OTHER): Payer: Medicare Other | Admitting: Physical Medicine and Rehabilitation

## 2021-07-27 ENCOUNTER — Encounter: Payer: Self-pay | Admitting: Physical Medicine and Rehabilitation

## 2021-07-27 ENCOUNTER — Other Ambulatory Visit: Payer: Self-pay

## 2021-07-27 ENCOUNTER — Ambulatory Visit: Payer: Self-pay

## 2021-07-27 VITALS — BP 148/78 | HR 80

## 2021-07-27 DIAGNOSIS — M47816 Spondylosis without myelopathy or radiculopathy, lumbar region: Secondary | ICD-10-CM

## 2021-07-27 MED ORDER — BUPIVACAINE HCL 0.5 % IJ SOLN
3.0000 mL | Freq: Once | INTRAMUSCULAR | Status: AC
  Administered 2021-07-27: 3 mL

## 2021-07-27 NOTE — Progress Notes (Signed)
Pt state lower back pain. Pt state walking and standing makes the pain worse. Pt state he takes pain meds to help ease his pain.  Numeric Pain Rating Scale and Functional Assessment Average Pain 5   In the last MONTH (on 0-10 scale) has pain interfered with the following?  1. General activity like being  able to carry out your everyday physical activities such as walking, climbing stairs, carrying groceries, or moving a chair?  Rating(9)   +Driver, +BT, -Dye Allergies.

## 2021-07-27 NOTE — Patient Instructions (Signed)

## 2021-07-28 ENCOUNTER — Other Ambulatory Visit: Payer: Self-pay | Admitting: Physical Medicine and Rehabilitation

## 2021-07-28 DIAGNOSIS — M47816 Spondylosis without myelopathy or radiculopathy, lumbar region: Secondary | ICD-10-CM

## 2021-07-28 NOTE — Progress Notes (Signed)
Patient faxed in pain diary, reports 100% pain relief with facet joint blocks, we will proceed with scheduling bilateral L4-L5 and L5-S1 radiofrequency ablation.

## 2021-07-29 NOTE — Progress Notes (Signed)
Daniel [name] [place] - 88 y.o. male MRN [id number]  Date of birth: 03-02-1933  Office Visit Note: Visit Date: 07/27/2021 PCP: Burnard Bunting, MD Referred by: Burnard Bunting, MD  Subjective: Chief Complaint  Patient presents with   Lower Back - Pain   HPI:  Daniel [name] is a 88 y.o. male who comes in today for planned repeat Bilateral L4-5 and L5-S1 Lumbar facet/medial branch block with fluoroscopic guidance.  The patient has failed conservative care including home exercise, medications, time and activity modification.  This injection will be diagnostic and hopefully therapeutic.  Please see requesting physician notes for further details and justification.  Exam shows concordant low back pain with facet joint loading and extension. Patient received more than 80% pain relief from prior injection. This would be the second block in a diagnostic double block paradigm.     Referring:Dr. Jean Rosenthal   ROS Otherwise per HPI.  Assessment & Plan: Visit Diagnoses:    ICD-10-CM   1. Facet hypertrophy of lumbar region  M47.816 XR C-ARM NO REPORT    Facet Injection    bupivacaine (MARCAINE) 0.5 % (with pres) injection 3 mL    2. Spondylosis without myelopathy or radiculopathy, lumbar region  M47.816 XR C-ARM NO REPORT    Facet Injection    bupivacaine (MARCAINE) 0.5 % (with pres) injection 3 mL      Plan: No additional findings.   Meds & Orders:  Meds ordered this encounter  Medications   bupivacaine (MARCAINE) 0.5 % (with pres) injection 3 mL    Orders Placed This Encounter  Procedures   Facet Injection   XR C-ARM NO REPORT    Follow-up: Return for Review Pain Diary.   Procedures: No procedures performed  Lumbar Diagnostic Facet Joint Nerve Block with Fluoroscopic Guidance   Patient: Daniel [name]      Date of Birth: December 05, 1932 MRN: [id number] PCP: Burnard Bunting, MD      Visit Date: 07/27/2021   Universal Protocol:    Date/Time: 02/22/235:50 AM  Consent Given  By: the patient  Position: PRONE  Additional Comments: Vital signs were monitored before and after the procedure. Patient was prepped and draped in the usual sterile fashion. The correct patient, procedure, and site was verified.   Injection Procedure Details:   Procedure diagnoses:  1. Facet hypertrophy of lumbar region   2. Spondylosis without myelopathy or radiculopathy, lumbar region      Meds Administered:  Meds ordered this encounter  Medications   bupivacaine (MARCAINE) 0.5 % (with pres) injection 3 mL     Laterality: Bilateral  Location/Site: L4-L5, L3 and L4 medial branches and L5-S1, L4 medial branch and L5 dorsal ramus  Needle: 5.0 in., 25 ga.  Short bevel or Quincke spinal needle  Needle Placement: Oblique pedical  Findings:   -Comments: There was excellent flow of contrast along the articular pillars without intravascular flow.  Procedure Details: The fluoroscope beam is vertically oriented in AP and then obliqued 15 to 20 degrees to the ipsilateral side of the desired nerve to achieve the Scotty dog appearance.  The skin over the target area of the junction of the superior articulating process and the transverse process (sacral ala if blocking the L5 dorsal rami) was locally anesthetized with a 1 ml volume of 1% Lidocaine without Epinephrine.  The spinal needle was inserted and advanced in a trajectory view down to the target.   After contact with periosteum and negative aspirate for blood and CSF, correct  placement without intravascular or epidural spread was confirmed by injecting 0.5 ml. of Isovue-250.  A spot radiograph was obtained of this image.    Next, a 0.5 ml. volume of the injectate described above was injected. The needle was then redirected to the other facet joint nerves mentioned above if needed.  Prior to the procedure, the patient was given a Pain Diary which was completed for baseline measurements.  After the procedure, the patient rated  their pain every 30 minutes and will continue rating at this frequency for a total of 5 hours.  The patient has been asked to complete the Diary and return to [place] by mail, fax or hand delivered as soon as possible.   Additional Comments:  The patient tolerated the procedure well Dressing: 2 x 2 sterile gauze and Band-Aid    Post-procedure details: Patient was observed during the procedure. Post-procedure instructions were reviewed.  Patient left the clinic in stable condition.    Clinical History: MRI lumbar spine:   TECHNIQUE: Sagittal and axial T1 and T2-weighted sequences were performed. Additional sagittal STIR images were performed.   INDICATION: Back pain   COMPARISON: None   FINDINGS:  #  5 mm grade 1 anterolisthesis L4-5 with minimal grade 1 anterolisthesis is L5-S1.  #  Vertebral body heights are well maintained.  #  Trace type stress I Modic endplate change [id number] and type II at L4-5  #  Conus terminates at T12-L1 without evidence of tethering.  #  Nerve roots appear normal.  #  Incidental findings: None.    #  L1-2: Mild degenerative disc disease. Canal is well-maintained. Neuroforamina patent.  #    #  L2-3: Mild degenerative disc disease. Central canal is well-maintained. Neural foramina are patent.  #    #  L3-4: Mild degenerative disc disease. There is facet and disc bulge causing mild central canal stenosis with mild bilateral foraminal narrowing.  #    #  L4-5: Severe degenerative disc disease. Grade 1 anterolisthesis and laminectomy decompression with facet arthropathy and disc uncovering causing mild central canal and lateral recess stenosis and moderate bilateral foraminal narrowing  #    #  L5-S1: Grade 1 anterolisthesis. Facet arthropathy and disc bulge causes mild central canal stenosis and mild bilateral foraminal narrowing.    IMPRESSION:   1.  L4-5 severe degenerative disc disease with grade 1 anterolisthesis and laminectomy decompression. There is  mild residual central canal lateral recess stenosis.   2.  L5-S1 minimal grade 1 anterolisthesis contributes to mild central canal stenosis   Electronically Signed by: Ritta Slot on 06/10/2021 1:58 PM ----  Electrodiagnostic study of both upper limbs dated 05/13/2010 showed normal electrodiagnostic study     Objective:  VS:  HT:     WT:    BMI:      BP:(!) 148/78   HR:80bpm   TEMP: ( )   RESP:  Physical Exam Vitals and nursing note reviewed.  Constitutional:      General: He is not in acute distress.    Appearance: Normal appearance. He is not ill-appearing.  HENT:     Head: Normocephalic and atraumatic.     Right Ear: External ear normal.     Left Ear: External ear normal.     Nose: No congestion.  Eyes:     Extraocular Movements: Extraocular movements intact.  Cardiovascular:     Rate and Rhythm: Normal rate.     Pulses: Normal pulses.  Pulmonary:  Effort: Pulmonary effort is normal. No respiratory distress.  Abdominal:     General: There is no distension.     Palpations: Abdomen is soft.  Musculoskeletal:        General: No tenderness or signs of injury.     Cervical back: Neck supple.     Right lower leg: No edema.     Left lower leg: No edema.     Comments: Patient has good distal strength without clonus. Patient somewhat slow to rise from a seated position to full extension.  There is concordant low back pain with facet loading and lumbar spine extension rotation.  There are no definitive trigger points but the patient is somewhat tender across the lower back and PSIS.  There is no pain with hip rotation.   Skin:    Findings: No erythema or rash.  Neurological:     General: No focal deficit present.     Mental Status: He is alert and oriented to person, place, and time.     Sensory: No sensory deficit.     Motor: No weakness or abnormal muscle tone.     Coordination: Coordination normal.  Psychiatric:        Mood and Affect: Mood normal.        Behavior:  Behavior normal.     Imaging: No results found.

## 2021-07-29 NOTE — Procedures (Signed)
Lumbar Diagnostic Facet Joint Nerve Block with Fluoroscopic Guidance   Patient: Daniel [name]      Date of Birth: 31-Oct-1932 MRN: [id number] PCP: Burnard Bunting, MD      Visit Date: 07/27/2021   Universal Protocol:    Date/Time: 02/22/235:50 AM  Consent Given By: the patient  Position: PRONE  Additional Comments: Vital signs were monitored before and after the procedure. Patient was prepped and draped in the usual sterile fashion. The correct patient, procedure, and site was verified.   Injection Procedure Details:   Procedure diagnoses:  1. Facet hypertrophy of lumbar region   2. Spondylosis without myelopathy or radiculopathy, lumbar region      Meds Administered:  Meds ordered this encounter  Medications   bupivacaine (MARCAINE) 0.5 % (with pres) injection 3 mL     Laterality: Bilateral  Location/Site: L4-L5, L3 and L4 medial branches and L5-S1, L4 medial branch and L5 dorsal ramus  Needle: 5.0 in., 25 ga.  Short bevel or Quincke spinal needle  Needle Placement: Oblique pedical  Findings:   -Comments: There was excellent flow of contrast along the articular pillars without intravascular flow.  Procedure Details: The fluoroscope beam is vertically oriented in AP and then obliqued 15 to 20 degrees to the ipsilateral side of the desired nerve to achieve the Scotty dog appearance.  The skin over the target area of the junction of the superior articulating process and the transverse process (sacral ala if blocking the L5 dorsal rami) was locally anesthetized with a 1 ml volume of 1% Lidocaine without Epinephrine.  The spinal needle was inserted and advanced in a trajectory view down to the target.   After contact with periosteum and negative aspirate for blood and CSF, correct placement without intravascular or epidural spread was confirmed by injecting 0.5 ml. of Isovue-250.  A spot radiograph was obtained of this image.    Next, a 0.5 ml. volume of the injectate  described above was injected. The needle was then redirected to the other facet joint nerves mentioned above if needed.  Prior to the procedure, the patient was given a Pain Diary which was completed for baseline measurements.  After the procedure, the patient rated their pain every 30 minutes and will continue rating at this frequency for a total of 5 hours.  The patient has been asked to complete the Diary and return to [place] by mail, fax or hand delivered as soon as possible.   Additional Comments:  The patient tolerated the procedure well Dressing: 2 x 2 sterile gauze and Band-Aid    Post-procedure details: Patient was observed during the procedure. Post-procedure instructions were reviewed.  Patient left the clinic in stable condition.

## 2021-08-02 ENCOUNTER — Encounter (HOSPITAL_BASED_OUTPATIENT_CLINIC_OR_DEPARTMENT_OTHER): Payer: Self-pay | Admitting: Emergency Medicine

## 2021-08-02 ENCOUNTER — Telehealth: Payer: Self-pay | Admitting: Cardiology

## 2021-08-02 ENCOUNTER — Emergency Department (HOSPITAL_BASED_OUTPATIENT_CLINIC_OR_DEPARTMENT_OTHER)
Admission: EM | Admit: 2021-08-02 | Discharge: 2021-08-03 | Disposition: A | Discharge status: Home / Self Care | Payer: Medicare Other | Attending: Emergency Medicine | Admitting: Emergency Medicine

## 2021-08-02 ENCOUNTER — Emergency Department (HOSPITAL_BASED_OUTPATIENT_CLINIC_OR_DEPARTMENT_OTHER): Payer: Medicare Other

## 2021-08-02 ENCOUNTER — Other Ambulatory Visit: Payer: Self-pay

## 2021-08-02 DIAGNOSIS — R42 Dizziness and giddiness: Secondary | ICD-10-CM | POA: Diagnosis not present

## 2021-08-02 DIAGNOSIS — R519 Headache, unspecified: Secondary | ICD-10-CM

## 2021-08-02 DIAGNOSIS — Z79899 Other long term (current) drug therapy: Secondary | ICD-10-CM | POA: Diagnosis not present

## 2021-08-02 DIAGNOSIS — I10 Essential (primary) hypertension: Secondary | ICD-10-CM | POA: Insufficient documentation

## 2021-08-02 DIAGNOSIS — R5383 Other fatigue: Secondary | ICD-10-CM

## 2021-08-02 DIAGNOSIS — R030 Elevated blood-pressure reading, without diagnosis of hypertension: Secondary | ICD-10-CM

## 2021-08-02 LAB — BASIC METABOLIC PANEL
Anion gap: 7 (ref 5–15)
BUN: 23 mg/dL (ref 8–23)
CO2: 29 mmol/L (ref 22–32)
Calcium: 10 mg/dL (ref 8.9–10.3)
Chloride: 102 mmol/L (ref 98–111)
Creatinine, Ser: 0.9 mg/dL (ref 0.61–1.24)
GFR, Estimated: >60 mL/min (ref >60)
Glucose, Bld: 100 mg/dL — ABNORMAL HIGH (ref 70–99)
Potassium: 4.5 mmol/L (ref 3.5–5.1)
Sodium: 138 mmol/L (ref 135–145)

## 2021-08-02 LAB — URINALYSIS, ROUTINE W REFLEX MICROSCOPIC
Bilirubin Urine: NEGATIVE
Glucose, UA: NEGATIVE mg/dL
Hgb urine dipstick: NEGATIVE
Ketones, ur: NEGATIVE mg/dL
Leukocytes,Ua: NEGATIVE
Nitrite: NEGATIVE
Protein, ur: NEGATIVE mg/dL
Specific Gravity, Urine: 1.01 (ref 1.005–1.030)
pH: 7.5 (ref 5.0–8.0)

## 2021-08-02 LAB — CBC
HCT: 42.2 % (ref 39.0–52.0)
Hemoglobin: 13.8 g/dL (ref 13.0–17.0)
MCH: 29.2 pg (ref 26.0–34.0)
MCHC: 32.7 g/dL (ref 30.0–36.0)
MCV: 89.2 fL (ref 80.0–100.0)
Platelets: 246 10*3/uL (ref 150–400)
RBC: 4.73 MIL/uL (ref 4.22–5.81)
RDW: 13.2 % (ref 11.5–15.5)
WBC: 7.4 10*3/uL (ref 4.0–10.5)
nRBC: 0 % (ref 0.0–0.2)

## 2021-08-02 MED ORDER — LABETALOL HCL 5 MG/ML IV SOLN
10.0000 mg | Freq: Once | INTRAVENOUS | Status: AC
  Administered 2021-08-02: 10 mg via INTRAVENOUS
  Filled 2021-08-02: qty 4

## 2021-08-02 MED ORDER — LISINOPRIL 10 MG PO TABS
10.0000 mg | ORAL_TABLET | Freq: Once | ORAL | Status: AC
  Administered 2021-08-02: 10 mg via ORAL
  Filled 2021-08-02: qty 1

## 2021-08-02 MED ORDER — NITROGLYCERIN 0.4 MG SL SUBL
0.4000 mg | SUBLINGUAL_TABLET | Freq: Once | SUBLINGUAL | Status: AC
  Administered 2021-08-02: 0.4 mg via SUBLINGUAL
  Filled 2021-08-02: qty 1

## 2021-08-02 MED ORDER — METOPROLOL SUCCINATE ER 25 MG PO TB24
50.0000 mg | ORAL_TABLET | Freq: Every day | ORAL | Status: DC
  Administered 2021-08-02: 50 mg via ORAL
  Filled 2021-08-02: qty 2

## 2021-08-02 NOTE — ED Notes (Addendum)
Pt started to have a headache yesterday, took a BP and noted it was over 200/100. Called MD, told to take extra of his hypertension medication, worked for a little while but continued into today. Called his MD again, told to take a nitro for the BP and again it only worked for a little while. This time he decided to be evaluated at the hospital.

## 2021-08-02 NOTE — ED Triage Notes (Signed)
Hypertension x 1 day , HX of it . Denies chest pain nor shortness of breath . Headache . No further symptoms . Feeling lethargic . Ambulatory to triage ,

## 2021-08-02 NOTE — Discharge Instructions (Signed)
Your work-up today was overall reassuring.  The CT head showed no evidence of intracranial bleeding or acute abnormality aside from the known sinus opacification from infection.  Please continue your home medications as we do not feel you have failed antibiotics yet still early in the course of treatment.  Your blood pressure responded to the IV blood pressure medicine and then your home blood pressure medicine was added back.  Your other labs were reassuring and we did not find any other concerning findings that would require admission at this time.  Please call your cardiology and PCP team tomorrow to discuss blood pressure medication titration in the setting of your sinus infection.  If any symptoms are to change, worsen, or intensify, please return to the nearest emergency department.

## 2021-08-02 NOTE — Telephone Encounter (Signed)
I agree with the plan.  I think that just going back to 10 mg twice daily lisinopril should help. (There have been issues with his insurance company and this does parameter, I think if necessary we can do 20 mg tablets where he takes 1/2 tablet twice daily, but for now continue with current prescriptions.  Bryan Lemma, MD

## 2021-08-02 NOTE — ED Notes (Signed)
Patient transported to CT 

## 2021-08-02 NOTE — Telephone Encounter (Signed)
Patient called in reporting his BP has been running high for the past several days. Systolic readings in the 190-200 range at times. Has had some headaches as well. He has been instructed to increase his lisinopril from 10mg  am/5mg  pm to 10mg  BID if readings are elevated in the past. He took additional lisinopril last evening. This morning took lisinopril 10mg  and BP 168/104. Had not taken his metoprolol 50mg  BID yet. I instructed to continue his metoprolol 50mg  BID and start taking the lisinopril 10mg  BID. Advised he could also take an extra 0.5 tab of metoprolol if readings remain elevated. He will make these adjustments and keep a log. I will route to PCP cardiologist/RN to please follow up with patient regarding readings. He voiced understanding and thanked me for follow up.

## 2021-08-02 NOTE — ED Provider Notes (Signed)
[facility] Provider Note   CSN: [id number] Arrival date & time: 08/02/21  1750     History  Chief Complaint  Patient presents with   Hypertension   Dizziness    Daniel [name] is a 88 y.o. male.  The history is provided by the patient, a relative and medical records. No language interpreter was used.  Hypertension This is a chronic problem. The problem occurs constantly. The problem has not changed since onset.Associated symptoms include headaches. Pertinent negatives include no chest pain, no abdominal pain and no shortness of breath. Nothing aggravates the symptoms. Nothing relieves the symptoms. He has tried nothing for the symptoms. The treatment provided no relief.  Dizziness Associated symptoms: headaches   Associated symptoms: no chest pain, no diarrhea, no nausea, no palpitations, no shortness of breath, no vomiting and no weakness       Home Medications Prior to Admission medications   Medication Sig Start Date End Date Taking? Authorizing Provider  acetaminophen (TYLENOL) 500 MG tablet Take 1,000 mg by mouth every 8 (eight) hours as needed for mild pain.    [provider]  azelastine (ASTELIN) 0.1 % nasal spray Place 1-2 sprays into both nostrils 2 (two) times daily. 09/09/20   Kozlow, Donnamarie Poag, MD  cholecalciferol (VITAMIN D) 1000 units tablet Take 1,000 Units by mouth daily.    [provider]  clindamycin (CLEOCIN) 300 MG capsule Take 600 mg by mouth 2 (two) times daily. 03/04/21   [provider]  clopidogrel (PLAVIX) 75 MG tablet TAKE 1 TABLET(75 MG) BY MOUTH DAILY 04/09/21   Leonie Man, MD  docusate sodium (COLACE) 100 MG capsule Take 200-300 mg by mouth daily as needed for mild constipation or moderate constipation.    [provider]  lisinopril (ZESTRIL) 10 MG tablet Take 10 mg in the morning  and 5 mg( 0.5 tablet) in the evening , may take an additional 0.5 tablet if blood pressure is greater  than 180/90 07/17/21   Leonie Man, MD  metoprolol succinate (TOPROL-XL) 50 MG 24 hr tablet Take 1 tablet (50 mg total) by mouth in the morning and at bedtime. 10/08/20   Leonie Man, MD  montelukast (SINGULAIR) 10 MG tablet TAKE 1 TABLET(10 MG) BY MOUTH AT BEDTIME 03/04/21   Kozlow, Donnamarie Poag, MD  Multiple Vitamin (MULTIVITAMIN WITH MINERALS) TABS tablet Take 1 tablet by mouth daily.    [provider]  nitroGLYCERIN (NITROSTAT) 0.4 MG SL tablet PLACE 1 TABLET UNDER THE TONGUE EVERY 5 MINUTES AS NEEDED FOR CHEST PAIN FOR 3 DOSES. IF NOT RELIEF AFTER FIRST DOSE, CALL PRESCRIBER OR 911. 04/16/21   Leonie Man, MD  tamsulosin (FLOMAX) 0.4 MG CAPS capsule Take 0.4 mg by mouth at bedtime.  06/08/14   [provider]  traMADol (ULTRAM) 50 MG tablet Take 50-100 mg by mouth 3 (three) times daily as needed. 03/16/21   [provider]  triamcinolone (NASACORT) 55 MCG/ACT AERO nasal inhaler Place 1 spray into the nose daily. 09/09/20   Kozlow, Donnamarie Poag, MD  vitamin B-12 (CYANOCOBALAMIN) 1000 MCG tablet Take 1,000 mcg by mouth 2 (two) times daily.     [provider]  vitamin C (ASCORBIC ACID) 250 MG tablet Take 250 mg by mouth daily.    [provider]  zinc gluconate 50 MG tablet Take 50 mg by mouth in the morning and at bedtime.    [provider]  zolpidem (AMBIEN) 10 MG tablet Take  10 mg by mouth at bedtime as needed for sleep. 03/24/20   [provider]      Allergies    Penicillins, Sulfa antibiotics, and Statins    Review of Systems   Review of Systems  Constitutional:  Positive for fatigue. Negative for chills, diaphoresis and fever.  HENT:  Positive for sinus pain. Negative for congestion.   Eyes:  Negative for photophobia, pain and visual disturbance.  Respiratory:  Negative for cough, chest tightness, shortness of breath and wheezing.   Cardiovascular:  Negative for chest pain, palpitations and leg swelling.  Gastrointestinal:   Negative for abdominal pain, constipation, diarrhea, nausea and vomiting.  Musculoskeletal:  Negative for back pain, neck pain and neck stiffness.  Skin:  Negative for rash and wound.  Neurological:  Positive for light-headedness and headaches. Negative for dizziness, seizures, facial asymmetry, speech difficulty, weakness and numbness.  Psychiatric/Behavioral:  Negative for agitation and confusion.   All other systems reviewed and are negative.  Physical Exam Updated Vital Signs BP (!) 195/93 (BP Location: Right Arm)    Pulse 82    Temp 98.5 F (36.9 C)    Resp 18    Wt 88 kg    SpO2 100%    BMI 24.25 kg/m  Physical Exam Vitals and nursing note reviewed.  Constitutional:      General: He is not in acute distress.    Appearance: He is well-developed. He is not ill-appearing, toxic-appearing or diaphoretic.  HENT:     Head: Normocephalic and atraumatic.     Nose: Nose normal. No congestion or rhinorrhea.     Mouth/Throat:     Mouth: Mucous membranes are moist.     Pharynx: No oropharyngeal exudate or posterior oropharyngeal erythema.  Eyes:     Conjunctiva/sclera: Conjunctivae normal.  Cardiovascular:     Rate and Rhythm: Normal rate and regular rhythm.     Heart sounds: No murmur heard. Pulmonary:     Effort: Pulmonary effort is normal. No respiratory distress.     Breath sounds: Normal breath sounds. No wheezing, rhonchi or rales.  Chest:     Chest wall: No tenderness.  Abdominal:     General: Abdomen is flat.     Palpations: Abdomen is soft.     Tenderness: There is no abdominal tenderness. There is no right CVA tenderness, left CVA tenderness, guarding or rebound.  Musculoskeletal:        General: No swelling or tenderness.     Cervical back: Neck supple. No tenderness.     Right lower leg: No edema.     Left lower leg: No edema.  Skin:    General: Skin is warm and dry.     Capillary Refill: Capillary refill takes less than 2 seconds.     Findings: No erythema or rash.   Neurological:     General: No focal deficit present.     Mental Status: He is alert.     Sensory: No sensory deficit.     Motor: No weakness.  Psychiatric:        Mood and Affect: Mood normal.    ED Results / Procedures / Treatments   Labs (all labs ordered are listed, but only abnormal results are displayed) Labs Reviewed  BASIC METABOLIC PANEL - Abnormal; Notable for the following components:      Result Value   Glucose, Bld 100 (*)    All other components within normal limits  CBC  URINALYSIS, ROUTINE W REFLEX MICROSCOPIC  TSH    EKG EKG Interpretation  Date/Time:  Sunday August 02 2021 18:15:46 EST Ventricular Rate:  85 PR Interval:  178 QRS Duration: 78 QT Interval:  368 QTC Calculation: 437 R Axis:   42 Text Interpretation: Normal sinus rhythm Nonspecific ST abnormality Abnormal ECG When compared with ECG of 08-Mar-2021 10:36, PREVIOUS ECG IS PRESENT when compared to prior, similar appearance. No STEMI Confirmed by Antony Blackbird [phone]) on 08/02/2021 7:28:36 PM  Radiology CT Head Wo Contrast  Result Date: 08/02/2021 CLINICAL DATA:  Headache. EXAM: CT HEAD WITHOUT CONTRAST TECHNIQUE: Contiguous axial images were obtained from the base of the skull through the vertex without intravenous contrast. RADIATION DOSE REDUCTION: This exam was performed according to the departmental dose-optimization program which includes automated exposure control, adjustment of the mA and/or kV according to patient size and/or use of iterative reconstruction technique. COMPARISON:  Head CT dated 03/08/2021. FINDINGS: Brain: Mild age-related atrophy and chronic microvascular ischemic changes. There is no acute intracranial hemorrhage. No mass effect or midline shift no extra-axial fluid collection. Vascular: No hyperdense vessel or unexpected calcification. Skull: Normal. Negative for fracture or focal lesion. Sinuses/Orbits: There is complete opacification of the left maxillary sinus. The  remainder of the visualized paranasal sinuses and mastoid air cells are clear. Other: None IMPRESSION: 1. No acute intracranial pathology. 2. Mild age-related atrophy and chronic microvascular ischemic changes. Electronically Signed   By: Anner Crete M.D.   On: 08/02/2021 20:35    Procedures Procedures    Medications Ordered in ED Medications  metoprolol succinate (TOPROL-XL) 24 hr tablet 50 mg (50 mg Oral Given 08/02/21 2309)  labetalol (NORMODYNE) injection 10 mg (10 mg Intravenous Given 08/02/21 2051)  lisinopril (ZESTRIL) tablet 10 mg (10 mg Oral Given 08/02/21 2310)  nitroGLYCERIN (NITROSTAT) SL tablet 0.4 mg (0.4 mg Sublingual Given 08/02/21 2309)    ED Course/ Medical Decision Making/ A&P                           Medical Decision Making Amount and/or Complexity of Data Reviewed Labs: ordered. Radiology: ordered.  Risk Prescription drug management.    TORAN WIMPY is a 88 y.o. 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  According to patient, for the last 2 days blood pressures have been increasing and today was elevated between 190 and 200 for most of the day.  He also started having more headaches that are described as mild to moderate.  He denies any nausea, vomiting, vision changes.  Denies any speech difficulties.  Denies any chest pain or shortness of breath but reports chronic palpitations.  Denies any diarrhea, urinary changes, he does report chronic constipation.  He reports no focal weakness but does report some tingling in his extremities.  Denies it currently.  He called his cardiology team who made some blood pressure change recommendations but despite this his blood pressure is still been about 200  prompting him to come in with the headaches.  On exam, lungs clear and chest nontender.  Abdomen nontender.  No carotid bruit appreciated.  Back nontender.  Neck nontender.  No focal neurologic deficits with normal sensation and strength in extremities.  Good pulses.  Pupils symmetric and reactive normal extraocular movements.  EKG shows no STEMI.  Heart rate is in the 80s and blood pressure is nearly 200 systolic.  We will give a dose of labetalol try to get blood pressure down as this may also help his headache.  We will get a CT head to look for any abnormalities.  We will get screening blood work.  I am somewhat concerned given his report that his ophthalmologist says he needs to keep his blood pressure down do not exacerbate his chronic problems in his eyes.  Patient is also concerned about this.  Will monitor his blood pressure and get basic labs.  If work-up is reassuring, will discuss close follow-up with PCP and outpatient blood pressure medication titration.  Anticipate reassessment after work-up  Work-up returned overall reassuring.  CT head showed no evidence of intracranial unready or bleeding but did show his full sinus related to his likely sinus infection he is currently on antibiotics for.  He is only had 2 days of antibiotics we do not feel he has failed antibiotics yet.  Otherwise labs reassuring with CBC and metabolic panel reassuring.  TSH in process and that will not come back tonight but PCP can follow-up on that.  EKG reassuring.  After labetalol, blood pressure improved.  It then gradually increased again but he did not have further symptoms.  Patient had not yet taken his home medicine this evening so we will give him that.  He also requested a dose of sublingual nitroglycerin which has helped with his blood pressures in the past.  We will give a dose of that.  Clinically I suspect that his sinus infection is causing his blood pressures to be more labile and the daily blood  pressure led to some headache.  As his symptoms have improved and blood pressure has shown to respond to medications, I do feel he is going to be safe for discharge home tonight.  Do not feel he needs admitted for hypertensive urgency or emergency given his lack of further symptoms and improvement with other reassuring work-up.  Patient will call his PCP and cardiology team tomorrow to discuss further titration of blood pressure medication or even addition of other meds.  As his blood pressure had improved into the 140s tonight here, I do not want to start a new blood pressure medicine at this time for home use.  Patient and family agree with this plan.  Blood pressure came down to the 150s and he still feels well.  No further symptoms.  He agrees with discharge home  Patient discharged after home medicines started to improve blood pressures and symptoms improved.  Patient discharged in good condition.        Final Clinical Impression(s) / ED Diagnoses Final diagnoses:  Nonintractable headache, unspecified chronicity pattern, unspecified headache type  Elevated blood pressure reading  Fatigue, unspecified type    Rx / DC Orders ED Discharge Orders     None       Clinical Impression: 1. Nonintractable headache, unspecified chronicity pattern, unspecified headache type   2. Elevated blood pressure reading   3. Fatigue, unspecified type     Disposition: Discharge  Condition: Good  I have discussed the results, Dx and Tx plan with the pt(& family if present). He/she/they expressed understanding and agree(s) with the plan. Discharge instructions discussed at great length. Strict return precautions discussed and pt &/or family have verbalized understanding of the instructions. No further questions at time of discharge.    New Prescriptions   No medications on file  Follow Up: Burnard Bunting, MD [place] [address] [phone]     [facility] [place] [phone]    your cardiologist         Rondalyn Belford, Gwenyth Allegra, MD 08/02/21 [phone]

## 2021-08-03 LAB — TSH: TSH: 0.081 u[IU]/mL — ABNORMAL LOW (ref 0.350–4.500)

## 2021-08-03 NOTE — Telephone Encounter (Signed)
Patient is following up, requesting advisement ASAP.

## 2021-08-09 ENCOUNTER — Other Ambulatory Visit: Payer: Self-pay | Admitting: Allergy and Immunology

## 2021-08-10 ENCOUNTER — Other Ambulatory Visit: Payer: Self-pay | Admitting: Allergy and Immunology

## 2021-09-21 ENCOUNTER — Ambulatory Visit: Payer: Medicare Other | Admitting: Physical Medicine and Rehabilitation

## 2021-09-21 ENCOUNTER — Encounter: Payer: Self-pay | Admitting: Physical Medicine and Rehabilitation

## 2021-09-21 ENCOUNTER — Ambulatory Visit: Payer: Self-pay

## 2021-09-21 DIAGNOSIS — M47816 Spondylosis without myelopathy or radiculopathy, lumbar region: Secondary | ICD-10-CM | POA: Diagnosis not present

## 2021-09-21 MED ORDER — METHYLPREDNISOLONE ACETATE 80 MG/ML IJ SUSP
80.0000 mg | Freq: Once | INTRAMUSCULAR | Status: AC
  Administered 2021-09-21: 80 mg

## 2021-09-21 NOTE — Progress Notes (Signed)
Pt state lower back pain. Pt state walking and standing makes the pain worse. Pt state he takes pain meds to help ease his pain. ?

## 2021-09-21 NOTE — Patient Instructions (Signed)

## 2021-09-27 ENCOUNTER — Other Ambulatory Visit: Payer: Self-pay | Admitting: Cardiology

## 2021-09-28 ENCOUNTER — Ambulatory Visit: Payer: Self-pay

## 2021-09-28 ENCOUNTER — Encounter: Payer: Self-pay | Admitting: Physical Medicine and Rehabilitation

## 2021-09-28 ENCOUNTER — Ambulatory Visit: Payer: Medicare Other | Admitting: Physical Medicine and Rehabilitation

## 2021-09-28 VITALS — BP 109/64 | HR 98

## 2021-09-28 DIAGNOSIS — M47816 Spondylosis without myelopathy or radiculopathy, lumbar region: Secondary | ICD-10-CM

## 2021-09-28 MED ORDER — METHYLPREDNISOLONE ACETATE 80 MG/ML IJ SUSP
80.0000 mg | Freq: Once | INTRAMUSCULAR | Status: AC
  Administered 2021-09-28: 80 mg

## 2021-09-28 NOTE — Progress Notes (Signed)
Pt state lower back pain. Pt state walking and standing makes the pain worse. Pt state he takes pain meds to help ease his pain. ? ?Numeric Pain Rating Scale and Functional Assessment ?Average Pain 2 ? ? ?In the last MONTH (on 0-10 scale) has pain interfered with the following? ? ?1. General activity like being  able to carry out your everyday physical activities such as walking, climbing stairs, carrying groceries, or moving a chair?  ?Rating(10) ? ? ?+Driver, -BT, -Dye Allergies. ? ?

## 2021-09-28 NOTE — Patient Instructions (Signed)

## 2021-09-30 NOTE — Procedures (Signed)
Lumbar Facet Joint Nerve Denervation ? ?Patient: Daniel [name]      ?Date of Birth: 16-Dec-1932 ?MRN: [id number] ?PCP: Geoffry Paradise, MD      ?Visit Date: 09/28/2021 ?  ?Universal Protocol:    ?Date/Time: 04/26/235:34 AM ? ?Consent Given By: the patient ? ?Position: PRONE ? ?Additional Comments: ?Vital signs were monitored before and after the procedure. ?Patient was prepped and draped in the usual sterile fashion. ?The correct patient, procedure, and site was verified. ? ? ?Injection Procedure Details:  ? ?Procedure diagnoses:  ?1. Spondylosis without myelopathy or radiculopathy, lumbar region   ?  ? ?Meds Administered:  ?Meds ordered this encounter  ?Medications  ? methylPREDNISolone acetate (DEPO-MEDROL) injection 80 mg  ?  ? ?Laterality: Left ? ?Location/Site:  L4-L5, L3 and L4 medial branches and L5-S1, L4 medial branch and L5 dorsal ramus ? ?Needle: 18 ga.,  24mm active tip, RF Cannula ? ?Needle Placement: Along juncture of superior articular process and transverse pocess ? ?Findings: ? -Comments: ? ?Procedure Details: ?For each desired target nerve, the corresponding transverse process (sacral ala for the L5 dorsal rami) was identified and the fluoroscope was positioned to square off the endplates of the corresponding vertebral body to achieve a true AP midline view.  The beam was then obliqued 15 to 20 degrees and caudally tilted 15 to 20 degrees to line up a trajectory along the target nerves. The skin over the target of the junction of superior articulating process and transverse process (sacral ala for the L5 dorsal rami) was infiltrated with 24ml of 1% Lidocaine without Epinephrine.  The 18 gauge 62mm active tip outer cannula was advanced in trajectory view to the target. ? ?This procedure was repeated for each target nerve.  Then, for all levels, the outer cannula placement was fine-tuned and the position was then confirmed with bi-planar imaging.   ? ?Test stimulation was done both at sensory and  motor levels to ensure there was no radicular stimulation. The target tissues were then infiltrated with 1 ml of 1% Lidocaine without Epinephrine. Subsequently, a percutaneous neurotomy was carried out for 90 seconds at 80 degrees Celsius.  After the completion of the lesion, 1 ml of injectate was delivered. It was then repeated for each facet joint nerve mentioned above. Appropriate radiographs were obtained to verify the probe placement during the neurotomy. ? ? ?Additional Comments:  ?The patient tolerated the procedure well ?Dressing: 2 x 2 sterile gauze and Band-Aid ?  ? ?Post-procedure details: ?Patient was observed during the procedure. ?Post-procedure instructions were reviewed. ? ?Patient left the clinic in stable condition. ? ? ? ?

## 2021-09-30 NOTE — Progress Notes (Signed)
? ?KORDAI SKEMP - 88 y.o. male MRN [id number]  Date of birth: [birth year] ? ?Office Visit Note: ?Visit Date: 09/28/2021 ?PCP: Burnard Bunting, MD ?Referred by: Burnard Bunting, MD ? ?Subjective: ?Chief Complaint  ?Patient presents with  ? Lower Back - Pain  ? ?HPI:  Daniel [name] is a 88 y.o. male who comes in todayfor planned radiofrequency ablation of the Left L4-5 and L5-S1 Lumbar facet joints. This would be ablation of the corresponding medial branches and/or dorsal rami.  Patient has had double diagnostic blocks with more than 50% relief.  These are documented on pain diary.  They have had chronic back pain for quite some time, more than 3 months, which has been an ongoing situation with recalcitrant axial back pain.  They have no radicular pain.  Their axial pain is worse with standing and ambulating and on exam today with facet loading.  They have had physical therapy as well as home exercise program.  The imaging noted in the chart below indicated facet pathology. Accordingly they meet all the criteria and qualification for for radiofrequency ablation and we are going to complete this today hopefully for more longer term relief as part of comprehensive management program. ? ?ROS Otherwise per HPI. ? ?Assessment & Plan: ?Visit Diagnoses:  ?  ICD-10-CM   ?1. Spondylosis without myelopathy or radiculopathy, lumbar region  M47.816 XR C-ARM NO REPORT  ?  Radiofrequency,Lumbar  ?  methylPREDNISolone acetate (DEPO-MEDROL) injection 80 mg  ?  ?  ?Plan: No additional findings.  ? ?Meds & Orders:  ?Meds ordered this encounter  ?Medications  ? methylPREDNISolone acetate (DEPO-MEDROL) injection 80 mg  ?  ?Orders Placed This Encounter  ?Procedures  ? Radiofrequency,Lumbar  ? XR C-ARM NO REPORT  ?  ?Follow-up: Return if symptoms worsen or fail to improve.  ? ?Procedures: ?No procedures performed  ?Lumbar Facet Joint Nerve Denervation ? ?Patient: Daniel [name]      ?Date of Birth: [birth year] ?MRN: [id number] ?PCP: Burnard Bunting, MD      ?Visit Date: 09/28/2021 ?  ?Universal Protocol:    ?Date/Time: 04/26/235:34 AM ? ?Consent Given By: the patient ? ?Position: PRONE ? ?Additional Comments: ?Vital signs were monitored before and after the procedure. ?Patient was prepped and draped in the usual sterile fashion. ?The correct patient, procedure, and site was verified. ? ? ?Injection Procedure Details:  ? ?Procedure diagnoses:  ?1. Spondylosis without myelopathy or radiculopathy, lumbar region   ?  ? ?Meds Administered:  ?Meds ordered this encounter  ?Medications  ? methylPREDNISolone acetate (DEPO-MEDROL) injection 80 mg  ?  ? ?Laterality: Left ? ?Location/Site:  L4-L5, L3 and L4 medial branches and L5-S1, L4 medial branch and L5 dorsal ramus ? ?Needle: 18 ga.,  71mm active tip, 173mm RF Cannula ? ?Needle Placement: Along juncture of superior articular process and transverse pocess ? ?Findings: ? -Comments: ? ?Procedure Details: ?For each desired target nerve, the corresponding transverse process (sacral ala for the L5 dorsal rami) was identified and the fluoroscope was positioned to square off the endplates of the corresponding vertebral body to achieve a true AP midline view.  The beam was then obliqued 15 to 20 degrees and caudally tilted 15 to 20 degrees to line up a trajectory along the target nerves. The skin over the target of the junction of superior articulating process and transverse process (sacral ala for the L5 dorsal rami) was infiltrated with 44ml of 1% Lidocaine without Epinephrine.  The 18 gauge 47mm active tip outer  cannula was advanced in trajectory view to the target. ? ?This procedure was repeated for each target nerve.  Then, for all levels, the outer cannula placement was fine-tuned and the position was then confirmed with bi-planar imaging.   ? ?Test stimulation was done both at sensory and motor levels to ensure there was no radicular stimulation. The target tissues were then infiltrated with 1 ml of 1% Lidocaine  without Epinephrine. Subsequently, a percutaneous neurotomy was carried out for 90 seconds at 80 degrees Celsius.  After the completion of the lesion, 1 ml of injectate was delivered. It was then repeated for each facet joint nerve mentioned above. Appropriate radiographs were obtained to verify the probe placement during the neurotomy. ? ? ?Additional Comments:  ?The patient tolerated the procedure well ?Dressing: 2 x 2 sterile gauze and Band-Aid ?  ? ?Post-procedure details: ?Patient was observed during the procedure. ?Post-procedure instructions were reviewed. ? ?Patient left the clinic in stable condition. ? ? ?  ? ?Clinical History: ?No specialty comments available.  ? ? ? ?Objective:  VS:  HT:    WT:   BMI:     BP:109/64  HR:98bpm  TEMP: ( )  RESP:  ?Physical Exam ?Vitals and nursing note reviewed.  ?Constitutional:   ?   General: He is not in acute distress. ?   Appearance: Normal appearance. He is not ill-appearing.  ?HENT:  ?   Head: Normocephalic and atraumatic.  ?   Right Ear: External ear normal.  ?   Left Ear: External ear normal.  ?   Nose: No congestion.  ?Eyes:  ?   Extraocular Movements: Extraocular movements intact.  ?Cardiovascular:  ?   Rate and Rhythm: Normal rate.  ?   Pulses: Normal pulses.  ?Pulmonary:  ?   Effort: Pulmonary effort is normal. No respiratory distress.  ?Abdominal:  ?   General: There is no distension.  ?   Palpations: Abdomen is soft.  ?Musculoskeletal:     ?   General: No tenderness or signs of injury.  ?   Cervical back: Neck supple.  ?   Right lower leg: No edema.  ?   Left lower leg: No edema.  ?   Comments: Patient has good distal strength without clonus. Patient somewhat slow to rise from a seated position to full extension.  There is concordant low back pain with facet loading and lumbar spine extension rotation.  There are no definitive trigger points but the patient is somewhat tender across the lower back and PSIS.  There is no pain with hip rotation.    ?Skin: ?   Findings: No erythema or rash.  ?Neurological:  ?   General: No focal deficit present.  ?   Mental Status: He is alert and oriented to person, place, and time.  ?   Sensory: No sensory deficit.  ?   Motor: No weakness or abnormal muscle tone.  ?   Coordination: Coordination normal.  ?Psychiatric:     ?   Mood and Affect: Mood normal.     ?   Behavior: Behavior normal.  ?  ? ?Imaging: ?XR C-ARM NO REPORT ? ?Result Date: 09/29/2021 ?Please see Notes tab for imaging impression.  ? ?

## 2021-09-30 NOTE — Progress Notes (Signed)
? ?Daniel [name] - 88 y.o. male MRN [id number]  Date of birth: 04/28/1933 ? ?Office Visit Note: ?Visit Date: 09/21/2021 ?PCP: Geoffry Paradise, MD ?Referred by: Geoffry Paradise, MD ? ?Subjective: ?Chief Complaint  ?Patient presents with  ? Lower Back - Pain  ? ?HPI:  Daniel [name] is a 88 y.o. male who comes in todayfor planned radiofrequency ablation of the Right L4-5 and L5-S1 Lumbar facet joints. This would be ablation of the corresponding medial branches and/or dorsal rami.  Patient has had double diagnostic blocks with more than 50% relief.  These are documented on pain diary.  They have had chronic back pain for quite some time, more than 3 months, which has been an ongoing situation with recalcitrant axial back pain.  They have no radicular pain.  Their axial pain is worse with standing and ambulating and on exam today with facet loading.  They have had physical therapy as well as home exercise program.  The imaging noted in the chart below indicated facet pathology. Accordingly they meet all the criteria and qualification for for radiofrequency ablation and we are going to complete this today hopefully for more longer term relief as part of comprehensive management program. ? ?ROS Otherwise per HPI. ? ?Assessment & Plan: ?Visit Diagnoses:  ?  ICD-10-CM   ?1. Spondylosis without myelopathy or radiculopathy, lumbar region  M47.816 XR C-ARM NO REPORT  ?  Radiofrequency,Lumbar  ?  methylPREDNISolone acetate (DEPO-MEDROL) injection 80 mg  ?  ?  ?Plan: No additional findings.  ? ?Meds & Orders:  ?Meds ordered this encounter  ?Medications  ? methylPREDNISolone acetate (DEPO-MEDROL) injection 80 mg  ?  ?Orders Placed This Encounter  ?Procedures  ? Radiofrequency,Lumbar  ? XR C-ARM NO REPORT  ?  ?Follow-up: Return if symptoms worsen or fail to improve.  ? ?Procedures: ?No procedures performed  ?Lumbar Facet Joint Nerve Denervation ? ?Patient: Daniel [name]      ?Date of Birth: [birth year] ?MRN: [id number] ?PCP:  Geoffry Paradise, MD      ?Visit Date: 09/21/2021 ?  ?Universal Protocol:    ?Date/Time: 04/26/235:32 AM ? ?Consent Given By: the patient ? ?Position: PRONE ? ?Additional Comments: ?Vital signs were monitored before and after the procedure. ?Patient was prepped and draped in the usual sterile fashion. ?The correct patient, procedure, and site was verified. ? ? ?Injection Procedure Details:  ? ?Procedure diagnoses:  ?1. Spondylosis without myelopathy or radiculopathy, lumbar region   ?  ? ?Meds Administered:  ?Meds ordered this encounter  ?Medications  ? methylPREDNISolone acetate (DEPO-MEDROL) injection 80 mg  ?  ? ?Laterality: Right ? ?Location/Site:  L4-L5, L3 and L4 medial branches and L5-S1, L4 medial branch and L5 dorsal ramus ? ?Needle: 18 ga.,  8mm active tip, RF Cannula ? ?Needle Placement: Along juncture of superior articular process and transverse pocess ? ?Findings: ? -Comments: ? ?Procedure Details: ?For each desired target nerve, the corresponding transverse process (sacral ala for the L5 dorsal rami) was identified and the fluoroscope was positioned to square off the endplates of the corresponding vertebral body to achieve a true AP midline view.  The beam was then obliqued 15 to 20 degrees and caudally tilted 15 to 20 degrees to line up a trajectory along the target nerves. The skin over the target of the junction of superior articulating process and transverse process (sacral ala for the L5 dorsal rami) was infiltrated with 65ml of 1% Lidocaine without Epinephrine.  The 18 gauge 61mm active tip outer  cannula was advanced in trajectory view to the target. ? ?This procedure was repeated for each target nerve.  Then, for all levels, the outer cannula placement was fine-tuned and the position was then confirmed with bi-planar imaging.   ? ?Test stimulation was done both at sensory and motor levels to ensure there was no radicular stimulation. The target tissues were then infiltrated with 1 ml of 1%  Lidocaine without Epinephrine. Subsequently, a percutaneous neurotomy was carried out for 90 seconds at 80 degrees Celsius.  After the completion of the lesion, 1 ml of injectate was delivered. It was then repeated for each facet joint nerve mentioned above. Appropriate radiographs were obtained to verify the probe placement during the neurotomy. ? ? ?Additional Comments:  ?The patient tolerated the procedure well ?Dressing: 2 x 2 sterile gauze and Band-Aid ?  ? ?Post-procedure details: ?Patient was observed during the procedure. ?Post-procedure instructions were reviewed. ? ?Patient left the clinic in stable condition. ? ? ?  ? ?Clinical History: ?No specialty comments available.  ? ? ? ?Objective:  VS:  HT:    WT:   BMI:     BP:   HR: bpm  TEMP: ( )  RESP:  ?Physical Exam ?Vitals and nursing note reviewed.  ?Constitutional:   ?   General: He is not in acute distress. ?   Appearance: Normal appearance. He is not ill-appearing.  ?HENT:  ?   Head: Normocephalic and atraumatic.  ?   Right Ear: External ear normal.  ?   Left Ear: External ear normal.  ?   Nose: No congestion.  ?Eyes:  ?   Extraocular Movements: Extraocular movements intact.  ?Cardiovascular:  ?   Rate and Rhythm: Normal rate.  ?   Pulses: Normal pulses.  ?Pulmonary:  ?   Effort: Pulmonary effort is normal. No respiratory distress.  ?Abdominal:  ?   General: There is no distension.  ?   Palpations: Abdomen is soft.  ?Musculoskeletal:     ?   General: No tenderness or signs of injury.  ?   Cervical back: Neck supple.  ?   Right lower leg: No edema.  ?   Left lower leg: No edema.  ?   Comments: Patient has good distal strength without clonus. Patient somewhat slow to rise from a seated position to full extension.  There is concordant low back pain with facet loading and lumbar spine extension rotation.  There are no definitive trigger points but the patient is somewhat tender across the lower back and PSIS.  There is no pain with hip rotation.    ?Skin: ?   Findings: No erythema or rash.  ?Neurological:  ?   General: No focal deficit present.  ?   Mental Status: He is alert and oriented to person, place, and time.  ?   Sensory: No sensory deficit.  ?   Motor: No weakness or abnormal muscle tone.  ?   Coordination: Coordination normal.  ?   Gait: Gait abnormal.  ?Psychiatric:     ?   Mood and Affect: Mood normal.     ?   Behavior: Behavior normal.  ?  ? ?Imaging: ?XR C-ARM NO REPORT ? ?Result Date: 09/29/2021 ?Please see Notes tab for imaging impression.  ? ?

## 2021-09-30 NOTE — Procedures (Signed)
Lumbar Facet Joint Nerve Denervation ? ?Patient: Daniel [name]      ?Date of Birth: [birth year] ?MRN: [id number] ?PCP: Geoffry Paradise, MD      ?Visit Date: 09/21/2021 ?  ?Universal Protocol:    ?Date/Time: 04/26/235:32 AM ? ?Consent Given By: the patient ? ?Position: PRONE ? ?Additional Comments: ?Vital signs were monitored before and after the procedure. ?Patient was prepped and draped in the usual sterile fashion. ?The correct patient, procedure, and site was verified. ? ? ?Injection Procedure Details:  ? ?Procedure diagnoses:  ?1. Spondylosis without myelopathy or radiculopathy, lumbar region   ?  ? ?Meds Administered:  ?Meds ordered this encounter  ?Medications  ? methylPREDNISolone acetate (DEPO-MEDROL) injection 80 mg  ?  ? ?Laterality: Right ? ?Location/Site:  L4-L5, L3 and L4 medial branches and L5-S1, L4 medial branch and L5 dorsal ramus ? ?Needle: 18 ga.,  48mm active tip, RF Cannula ? ?Needle Placement: Along juncture of superior articular process and transverse pocess ? ?Findings: ? -Comments: ? ?Procedure Details: ?For each desired target nerve, the corresponding transverse process (sacral ala for the L5 dorsal rami) was identified and the fluoroscope was positioned to square off the endplates of the corresponding vertebral body to achieve a true AP midline view.  The beam was then obliqued 15 to 20 degrees and caudally tilted 15 to 20 degrees to line up a trajectory along the target nerves. The skin over the target of the junction of superior articulating process and transverse process (sacral ala for the L5 dorsal rami) was infiltrated with 48ml of 1% Lidocaine without Epinephrine.  The 18 gauge 76mm active tip outer cannula was advanced in trajectory view to the target. ? ?This procedure was repeated for each target nerve.  Then, for all levels, the outer cannula placement was fine-tuned and the position was then confirmed with bi-planar imaging.   ? ?Test stimulation was done both at sensory and  motor levels to ensure there was no radicular stimulation. The target tissues were then infiltrated with 1 ml of 1% Lidocaine without Epinephrine. Subsequently, a percutaneous neurotomy was carried out for 90 seconds at 80 degrees Celsius.  After the completion of the lesion, 1 ml of injectate was delivered. It was then repeated for each facet joint nerve mentioned above. Appropriate radiographs were obtained to verify the probe placement during the neurotomy. ? ? ?Additional Comments:  ?The patient tolerated the procedure well ?Dressing: 2 x 2 sterile gauze and Band-Aid ?  ? ?Post-procedure details: ?Patient was observed during the procedure. ?Post-procedure instructions were reviewed. ? ?Patient left the clinic in stable condition. ? ? ? ?

## 2021-10-06 ENCOUNTER — Telehealth: Payer: Self-pay | Admitting: Physical Medicine and Rehabilitation

## 2021-10-06 NOTE — Telephone Encounter (Signed)
Patient called advised he had an  radiofrequency ablation 09/21/2021 and 09/28/2021. Patient said his left knee down to his left foot is numb and is not getting any better. ?The number to contact patient is [phone] ?

## 2021-10-07 ENCOUNTER — Ambulatory Visit (INDEPENDENT_AMBULATORY_CARE_PROVIDER_SITE_OTHER): Payer: Medicare Other | Admitting: Physical Medicine and Rehabilitation

## 2021-10-07 ENCOUNTER — Encounter: Payer: Self-pay | Admitting: Physical Medicine and Rehabilitation

## 2021-10-07 VITALS — BP 97/63 | HR 84

## 2021-10-07 DIAGNOSIS — R202 Paresthesia of skin: Secondary | ICD-10-CM

## 2021-10-07 DIAGNOSIS — M47816 Spondylosis without myelopathy or radiculopathy, lumbar region: Secondary | ICD-10-CM

## 2021-10-07 DIAGNOSIS — R200 Anesthesia of skin: Secondary | ICD-10-CM

## 2021-10-07 DIAGNOSIS — G5732 Lesion of lateral popliteal nerve, left lower limb: Secondary | ICD-10-CM

## 2021-10-07 DIAGNOSIS — M961 Postlaminectomy syndrome, not elsewhere classified: Secondary | ICD-10-CM

## 2021-10-07 NOTE — Progress Notes (Signed)
Pt state numbness in his left knee to his ankle. ?

## 2021-10-07 NOTE — Progress Notes (Signed)
? ?[name] - 88 y.o. male MRN [id number]  Date of birth: 09/24/1932 ? ?Office Visit Note: ?Visit Date: 10/07/2021 ?PCP: Burnard Bunting, MD ?Referred by: Burnard Bunting, MD ? ?Subjective: ?Chief Complaint  ?Patient presents with  ? Left Knee - Numbness  ? Left Ankle - Numbness  ? ?HPI: Daniel [name] is a 88 y.o. male who comes in today for evaluation of acute onset of numbness/tingling to left knee, down leg to foot. Patient reports onset yesterday (Tuesday) after long day of walking, he reports pain is constant and does not increase with any sort of activity. He describes his pain as feeling of left lower leg being "dead" or "asleep." Patient recently had bilateral L4-L5 and L5-S1 radiofrequency ablation performed in our office, left side was completed on 09/28/2021. Patient reports numbness/tingling to entire left leg immediately after procedure, however this resolved in a day or so. Patient states he does have history of polyneuropathy. He reports numbness/tingling is not keeping him from walking and performing activities, however these symptoms are pretty much constant and aggravating. Patients lumbar MRI from January of this year exhibits previous laminectomy decompression at L4-L5, there is also grade 1 anterolisthesis, facet arthropathy, mild central canal stenosis and lateral recess/foraminal narrowing noted at this level. There is also facet arthropathy and mild spinal canal stenosis noted at L5-S1. Patient states he is approximately 3 weeks out from radiofrequency ablation procedure and reports significant pain relief to bilateral lower back, however he continues to experience paraesthesias to left lower leg. Patient denies focal weakness. Patient denies recent trauma or falls.  ? ?Review of Systems  ?Musculoskeletal:  Positive for back pain.  ?Neurological:  Positive for tingling and sensory change. Negative for focal weakness and weakness.  Otherwise per HPI. ? ?Assessment & Plan: ?Visit  Diagnoses:  ?  ICD-10-CM   ?1. Left peroneal nerve palsy  G57.32 Ambulatory referral to Physical Medicine Rehab  ?  ?2. Paresthesia of skin  R20.2 Ambulatory referral to Physical Medicine Rehab  ?  ?3. Left leg numbness  R20.0 Ambulatory referral to Physical Medicine Rehab  ?  ?4. Spondylosis without myelopathy or radiculopathy, lumbar region  M47.816   ?  ?5. Facet hypertrophy of lumbar region  M47.816   ?  ?6. Post laminectomy syndrome  M96.1   ?  ?   ?Plan: Findings:  ?Acute onset of numbness/tingling from eft knee radiating down leg to foot. Pain began yesterday after frequent walking and is not related to any aggravating factors.  Timing of the numbness paresthesia does not really correlate with the radiofrequency ablation procedure.  He did serendipitously have paresthesia down the leg immediately post procedure from the Marcaine medication is delivered during the procedure.  This resolved on the same day and he had really no real paresthesias or tingling up until yesterday.  Decreased sensation is noted to left lower leg. Patients clinical presentation and exam do seem to fit with possible peroneal nerve palsy.  He has unfortunately reported increased weight loss recently.  We believe the next step is to perform an NCV with EMG of left lower extremity. We feel that we can get patient in quickly for procedure, we instructed patient to monitor for signs/symptoms of weakness such as foot drop and to let us know immediately if this occurs. If nerve study is negative and symptoms persist we would consider obtaining new lumbar MRI imaging or possible referral to neurology for further evaluation. No red flag symptoms noted upon exam today.   ? ?  Meds & Orders: No orders of the defined types were placed in this encounter. ?  ?Orders Placed This Encounter  ?Procedures  ? Ambulatory referral to Physical Medicine Rehab  ?  ?Follow-up: Return for NCV wtih EMG of left lower extermity.  ? ?Procedures: ?No procedures  performed  ?   ? ?Clinical History: ?MRI lumbar spine:  ? ?TECHNIQUE: Sagittal and axial T1 and T2-weighted sequences were performed. Additional sagittal STIR images were performed.  ? ?INDICATION: Back pain  ? ?COMPARISON: None  ? ?FINDINGS:  ?#  5 mm grade 1 anterolisthesis L4-5 with minimal grade 1 anterolisthesis is L5-S1.  ?#  Vertebral body heights are well maintained.  ?#  Trace type stress I Modic endplate change [id number] and type II at L4-5  ?#  Conus terminates at T12-L1 without evidence of tethering.  ?#  Nerve roots appear normal.  ?#  Incidental findings: None.  ? ? ?#  L1-2: Mild degenerative disc disease. Canal is well-maintained. Neuroforamina patent.  ?#    ?#  L2-3: Mild degenerative disc disease. Central canal is well-maintained. Neural foramina are patent.  ?#    ?#  L3-4: Mild degenerative disc disease. There is facet and disc bulge causing mild central canal stenosis with mild bilateral foraminal narrowing.  ?#    ?#  L4-5: Severe degenerative disc disease. Grade 1 anterolisthesis and laminectomy decompression with facet arthropathy and disc uncovering causing mild central canal and lateral recess stenosis and moderate bilateral foraminal narrowing  ?#    ?#  L5-S1: Grade 1 anterolisthesis. Facet arthropathy and disc bulge causes mild central canal stenosis and mild bilateral foraminal narrowing.  ? ? ?IMPRESSION:  ? ?1.  L4-5 severe degenerative disc disease with grade 1 anterolisthesis and laminectomy decompression. There is mild residual central canal lateral recess stenosis.  ? ?2.  L5-S1 minimal grade 1 anterolisthesis contributes to mild central canal stenosis  ? ?Electronically Signed by: Ritta Slot on 06/10/2021 1:58 PM  ? ?He reports that he has never smoked. He has never used smokeless tobacco. No results for input(s): HGBA1C, LABURIC in the last 8760 hours. ? ?Objective:  VS:  HT:    WT:   BMI:     BP:97/63  HR:84bpm  TEMP: ( )  RESP:  ?Physical Exam ?Vitals and nursing note  reviewed.  ?Constitutional:   ?   Appearance: Normal appearance.  ?HENT:  ?   Head: Normocephalic and atraumatic.  ?   Right Ear: External ear normal.  ?   Left Ear: External ear normal.  ?   Nose: Nose normal.  ?   Mouth/Throat:  ?   Mouth: Mucous membranes are moist.  ?Eyes:  ?   Extraocular Movements: Extraocular movements intact.  ?Cardiovascular:  ?   Rate and Rhythm: Normal rate.  ?   Pulses: Normal pulses.  ?Pulmonary:  ?   Effort: Pulmonary effort is normal.  ?Abdominal:  ?   General: Abdomen is flat. There is no distension.  ?Musculoskeletal:     ?   General: Tenderness present.  ?   Cervical back: Normal range of motion.  ?   Comments: Pt is slow to rise from seated position to standing. Pain noted with facet loading. Strong distal strength without clonus, no pain upon palpation of greater trochanters. Walks independently, gait steady.   ?Skin: ?   General: Skin is warm and dry.  ?   Capillary Refill: Capillary refill takes less than 2 seconds.  ?Neurological:  ?   Mental  Status: He is alert and oriented to person, place, and time.  ?   Sensory: No sensory deficit.  ?   Motor: No weakness.  ?   Comments: Good distal strength noted, no foot drop, decreased sensation to left lower leg.  ?Psychiatric:     ?   Mood and Affect: Mood normal.     ?   Behavior: Behavior normal.  ?  ?Ortho Exam ? ?Imaging: ?No results found. ? ?Past Medical/Family/Surgical/Social History: ?Medications & Allergies reviewed per EMR, new medications updated. ?Patient Active Problem List  ? Diagnosis Date Noted  ? Myalgia due to statin 05/22/2021  ? Stenosis of cervical spine with myelopathy ([facility]) 07/14/2017  ? DOE (dyspnea on exertion) 03/10/2017  ? Acute maxillary sinusitis 02/15/2017  ? Allergic rhinitis 02/15/2017  ? Preoperative cardiovascular examination 08/16/2016  ? Chronic pain of both knees 07/13/2016  ? Fatigue due to treatment 09/11/2015  ? Unstable angina ([facility]) 08/06/2015  ? Low TSH level 08/13/2014  ? Orthostatic  hypotension 12/17/2013  ? Pseudoptosis 05/22/2013  ? Dermatochalasis 02/21/2013  ? Epiphora 02/21/2013  ? S/P CABG x 3   ? Essential hypertension   ? Dyslipidemia, goal LDL below 70   ? Heart palpitations   ? Nuclear cataract 08/21/201

## 2021-10-09 ENCOUNTER — Telehealth: Payer: Self-pay | Admitting: Physical Medicine and Rehabilitation

## 2021-10-09 NOTE — Telephone Encounter (Signed)
Patient returned call asked for a call back.   Ph# [phone]  ?

## 2021-10-20 ENCOUNTER — Telehealth: Payer: Self-pay | Admitting: Physical Medicine and Rehabilitation

## 2021-10-20 NOTE — Telephone Encounter (Signed)
IC advised patient verbalized understanding.  ?

## 2021-10-20 NOTE — Telephone Encounter (Signed)
Daniel [name] had bilateral ablasions performed and the left side is improving but the right side is still as painful as before the procedure. Please advise ?

## 2021-10-27 ENCOUNTER — Encounter: Payer: Self-pay | Admitting: Cardiology

## 2021-10-27 ENCOUNTER — Ambulatory Visit: Payer: Medicare Other | Admitting: Cardiology

## 2021-10-27 VITALS — BP 130/68 | HR 60 | Ht 75.0 in | Wt 195.4 lb

## 2021-10-27 DIAGNOSIS — R5383 Other fatigue: Secondary | ICD-10-CM

## 2021-10-27 DIAGNOSIS — E785 Hyperlipidemia, unspecified: Secondary | ICD-10-CM

## 2021-10-27 DIAGNOSIS — I10 Essential (primary) hypertension: Secondary | ICD-10-CM | POA: Diagnosis not present

## 2021-10-27 DIAGNOSIS — Z951 Presence of aortocoronary bypass graft: Secondary | ICD-10-CM

## 2021-10-27 DIAGNOSIS — R002 Palpitations: Secondary | ICD-10-CM

## 2021-10-27 DIAGNOSIS — I25118 Atherosclerotic heart disease of native coronary artery with other forms of angina pectoris: Secondary | ICD-10-CM

## 2021-10-27 DIAGNOSIS — T466X5D Adverse effect of antihyperlipidemic and antiarteriosclerotic drugs, subsequent encounter: Secondary | ICD-10-CM

## 2021-10-27 DIAGNOSIS — M791 Myalgia, unspecified site: Secondary | ICD-10-CM

## 2021-10-27 DIAGNOSIS — I951 Orthostatic hypotension: Secondary | ICD-10-CM | POA: Diagnosis not present

## 2021-10-27 DIAGNOSIS — R0609 Other forms of dyspnea: Secondary | ICD-10-CM

## 2021-10-27 DIAGNOSIS — T466X5A Adverse effect of antihyperlipidemic and antiarteriosclerotic drugs, initial encounter: Secondary | ICD-10-CM

## 2021-10-27 NOTE — Patient Instructions (Signed)
Medication Instructions:    Try to  decrease dose back  Lisinopril 10 mg in morning and 5 mg in the evening  If blood pressure  is  greater than systolic ( top number 200 /? Then take additional Amlodipine and a Nitroglycerin sublingual tablet.  *If you need a refill on your cardiac medications before your next appointment, please call your pharmacy*   Lab Work:  Not needed   Testing/Procedures: Not needed   Follow-Up: At [facility], you and your health needs are our priority.  As part of our continuing mission to provide you with exceptional heart care, we have created designated Provider Care Teams.  These Care Teams include your primary Cardiologist (physician) and Advanced Practice Providers (APPs -  Physician Assistants and Nurse Practitioners) who all work together to provide you with the care you need, when you need it.     Your next appointment:   6 month(s)  The format for your next appointment:   In Person  Provider:   Bryan Lemma, MD    Other Instructions

## 2021-10-27 NOTE — Progress Notes (Signed)
Primary Care Provider: Burnard Bunting, MD Cardiologist: Glenetta Hew, MD Electrophysiologist: None  Clinic Note: Chief Complaint  Patient presents with   Follow-up    Doing relatively well now, but has had blood pressure issues.   Coronary Artery Disease    No angina or CHF symptoms.   Hypertension    Labile up-and-down pressures.  Also having some lightheadedness.    ===================================  ASSESSMENT/PLAN   Problem List Items Addressed This Visit       Cardiology Problems   LM-CAD - atretic LIMA-LAD, patent SVG-D2-> fills LAD & SVG-OM, BMS PCI rPDA (Chronic)    Long history of CAD having CABG.  Unfortunately his LIMA became atretic with his SVG-diagonal filling the LAD via retrograde flow.  He has a patent SVG-OM and patent stent in the PDA by cardiac catheterization in 2017.[name]  He has done well with no active anginal symptoms.  Per our discussion, we will hold off on screening/surveillance stress test unless he has symptoms.  He remains on longstanding Plavix for combination of CAD as well as retinal artery occlusion. Intolerant of lipid-lowering agents and not willing to try injection medication such as PCSK9 inhibitors or even inclisiran.  Labile blood pressure being managed as mentioned.       Labile essential hypertension - Primary (Chronic)    Intermittent episodes of blood pressures in the 200 range with other times having orthostatic dizziness.  Difficult to manage.  We have split his medications into twice daily dosing to help with this.  He has not really had a spell few months ago but his blood pressure is better now.  He still has some amlodipine at home which could also be used as a PRN medication for high pressures.   Target blood pressure range for him is anywhere from 123456 systolic to 123XX123 systolic.  If he were to stop rest and recheck his blood pressure 30 minutes to an hour after he checks his pressure initially, if back in his zone, would  probably not treat.  For now, okay to continue his current dose of Toprol and try to reduce it back to 10 mg a.m. and 5 mg p.m. dosing of lisinopril.  If his pressures creep up he will go back to 10 mg twice daily.  For breakthrough hypertension spells (standing blood pressures > 180-200 mmHg) he will take a couple of nitroglycerin and 5 mg amlodipine       Dyslipidemia, goal LDL below 70 (Chronic)   Orthostatic hypotension (Chronic)   Relevant Orders   EKG 12-Lead (Completed)     Other   S/P CABG x 3 (Chronic)    Would be due for repeat ischemic evaluation this year as his last Myoview was in 2018.  Per discussion, we will hold off on further testing unless he has symptoms concerning for angina.       Relevant Orders   EKG 12-Lead (Completed)   Fatigue due to treatment (Chronic)    Difficult to really figure out what his fatigue is related to I think is probably multifactorial and mostly related to his age and lack of mobility now with his back and knee pain as well as hip pain.  We did reduce his Toprol dose in the past, but I am reluctant to do so now that he has had these blood pressure spikes.  Please encourage him to continue to try to walk and maintain activity level.       Heart palpitations (Chronic)  Symptoms well controlled with Toprol twice daily 50 mg       Relevant Orders   EKG 12-Lead (Completed)   DOE (dyspnea on exertion) (Chronic)    More noting fatigue and deconditioning and dyspnea now.       Myalgia due to statin (Chronic)    Has refused to take any statins and does not want to "test any other types of cholesterol lowering medications " Very fearful of side effects.  He understands the risk but also states that he is 88 years old.        ===================================  HPI:    Daniel [name] is a 88 y.o. male with a longstanding history of CAD, HTN (labile), HLD, and Right Retinal Vein Occlusion who presents today for 22-month follow-up  at the request of Burnard Bunting, MD.   CAD History: Previously followed by Dr. Chase Picket. 1997-exertional angina: Cardiac cath showed MV/LM CAD => CABG June 2008 (acute CHF symptoms): PTCA of RPDA Most Recent Cath-March 2017: Atretic LIMA-LAD.  LAD fills via SVG-D2.  SVG-OM and stent in RPDA both patent. CRFs: HTN-but troubled by orthostatic hypotension; HLD ->> has been intolerant of all statin, and unwilling to take other medications related to lipids. Right Retinal Vein Occlusion -> on long-term Plavix  Daniel [name] was last seen on April 27, 2021 as a 80-month follow-up.  He had been doing pretty well except for the time when he had a significantly elevated blood pressure to take him to the emergency room at [facility]-BP 200s/80s (in setting of sinus infection-as well as a steroid injection in the knee, and a large seafood dinner).  He had also suffered a fall with loss of balance a few months before that.  At that visit he denied any lightheadedness dizziness or wooziness.  No cardiac symptoms of angina or CHF and no arrhythmia symptoms.  Gradually picking up his exercise level but still bothered by his hips and knee pain to limit walking.  He had one knee done but was not sure about the other knee. => Hip back and left knee pain.  Positional dizziness. Recommended increasing morning dose of lisinopril to 10 mg with 5 mg in the evening, take additional 5 mg for systolic blood pressure greater than 180 over 90 mm along with a SL NTG .  Recent Hospitalizations:  Lumbar facet joint nerve blocks 07/01/2021 [facility] 2/26-27/2023: Headache with hypertension/dizziness noted accelerated hypertension along with sinus pain => blood pressure was 195/93; noted that blood pressure was elevated for about 2 days ranging between 190s to 200s for most of the day.  Started having headache but got worse.  No other neurologic symptoms. => He took an extra lisinopril 5 mg along  with nitroglycerin with no real benefit. Was actually on antibiotics for sinus headache. Was given labetalol 10 mg along with Toprol 50 mg and lisinopril 10 mg in place of his home medications along with SL NTG=> BPs were in the 140s on discharge.  He was seen by Dr. Reynaldo Minium on Oct 12, 2021.  BP was 130/64.  Noted chronic pansinusitis treated with clindamycin.  At that time he was actually on amlodipine 5 mg, and lisinopril 10 mg twice daily and Toprol 50 mg twice daily   Reviewed  CV studies:    The following studies were reviewed today: (if available, images/films reviewed: From Epic Chart or Care Everywhere) PCP ordered 24-hour ambulatory blood pressure monitor.:   Interval History:   Daniel  M Quirino.  Here today for follow-up indicating that that episode about 3 months ago when he had elevated blood pressure over 200s he tried to contact the office to get information.  We have told him to increase lisinopril back to 10 mg twice daily berry.  (Recently reduced it to 10 mg a.m. and 5 mg PRN dosing).  He went to the emergency room and was treated, he then apparently contacted Dr. Jacky Kindle and a 24-hour blood pressure monitor was ordered.  It sounds like it was a result of this he was placed on amlodipine which was then subsequently discontinued.  Over the last month or 2, his blood pressure is actually doing a lot better.  [name] means he still has an occasional lightheaded episodes.  He states that he wears out whole lot quicker than he used to.  A lot of this has to do with his that his balance is off and he has a hard time walking longer distance.  If he stands up for a while he will get tired and worn out.  He is otherwise doing okay from a cardiac standpoint no more labile blood pressure ranges at this point.  He is no longer taking amlodipine, and I cannot see any documentation as when it was started or discontinued. No chest pain/pressure or dyspnea with rest or exertion.No PND or  orthopnea with trivial lower extremity swelling. He has not had any more headache or blurred vision symptoms.  No syncope/near syncope or TIA/amaurosis fugax.  No claudication. \No arrhythmias.  Just easy fatigue.  REVIEWED OF SYSTEMS   Review of Systems  Constitutional:  Positive for malaise/fatigue (Easy fatigability.  Partly because of poor balance.).  HENT:  Positive for congestion and sinus pain. Negative for nosebleeds.        Chronic pansinusitis.  Off and on antibiotics.  Respiratory:  Positive for cough (Associated with congestion). Negative for shortness of breath and wheezing.   Cardiovascular:  Positive for leg swelling (Mild end of day).  Gastrointestinal:  Negative for blood in stool and melena.  Genitourinary:  Negative for dysuria, flank pain and frequency.  Musculoskeletal:  Positive for back pain and joint pain (Left knee, hips and back). Negative for falls (None recently).  Neurological:  Positive for dizziness (Still has some orthostatic dizziness in the morning.) and weakness (Legs feel weak after being on his feet for long time). Negative for focal weakness.       Poor balance.  Probably because his legs are weak but also vestibular.  Psychiatric/Behavioral:  Positive for memory loss (Trivial). Negative for depression. The patient is not nervous/anxious and does not have insomnia.    I have reviewed and (if needed) personally updated the patient's problem list, medications, allergies, past medical and surgical history, social and family history.   PAST MEDICAL HISTORY   Past Medical History:  Diagnosis Date   Arthritis    CAD of autologous arterial graft 2010   Atretic LIMA; Myoview Jan 2015: Possible mild basal anteroseptal defect thought to be artifact (CRO early LAD ischemia due to inadeuate retrograde perfusion via SVG-Diag   CAD S/P percutaneous coronary angioplasty 11/2006   a. 6/'08: PCI nongrafted distal RCA-PDA: Mini vision BMS 2.25 mm x 28 mm; b. Relook  Cath 08/2015: Stable CAD. No culprit lesion. Widely patent PDA stent. Patent SVG-OM 2, SVG-D2. Essentially atretic/small LIMA-LAD, but the LAD is perfused via SVG-D2   Complication of anesthesia    Coronary artery disease involving left main coronary artery  [id number]   Last cath 2010: 70-80 % ostial left main, 80-90% LAD, subtotal CTO LCx-OM2, RCA patent w/ patent BMS stent (dRCA-PDA); SVG-diagonal backfills LAD, atretic LIMA-LAD -- medical therapy; stable by relook cath in March 2017   Difficult intubation    VERY NARROW AIRWAY PER ANESTHESIOLOGIST (1997 CABG AT [facility])   Dyslipidemia, goal LDL below 70    Dysrhythmia    Heart palpitations    Never fully diagnosed.   History of kidney stones    Hypertension    Insomnia    Mass in chest    JUST WATCHING , WILL CHECK OUT AFTER SURGERY   Pneumonia    S/P CABG x 3 1997   LIMA-LAD, SVG-D2, SVG-OM --> LIMA known to be atretic, but SVG-D2 fills diagonal and LAD   Stenosis of cervical spine with myelopathy (HCC)    Tingling    in fingers    PAST SURGICAL HISTORY   Past Surgical History:  Procedure Laterality Date   48-HOUR MONITOR  03/2017   Relatively normal.  Very infrequent ectopy.  Short PAT run with occasional PACs/PVCs and bigeminy.  No symptoms noted on monitor.  Longest PAT runs were less than 10 beats.   BACK SURGERY     decompression  lower back    CARDIAC CATHETERIZATION N/A 08/08/2015   Procedure: Left Heart Cath and Cors/Grafts Angiography;  Surgeon: Leonie Man, MD;  Location: [facility] INVASIVE CV LAB;; -->  LM 80%-70%. Ost LAD 80% then prox-mid 100% (after small D1). Small LCx & OM2 patent with 100% OM2. Prox & distal RCA 20%, patent BMS in distal RCA-rPDA.  Patent/atretic LIMA-dLAD, SVG-D2 (backdills entire dLAD) & SVG-OM2.    COLONOSCOPY     CORONARY ANGIOPLASTY WITH STENT PLACEMENT  [facility] [place] 2008   BMS PCI of distal RCA-RPDA: PCI with a 2.25 mm x 28 mm Mini Vision bare-metal stent.   CORONARY ARTERY BYPASS GRAFT  [id number]   LIMA -  LAD, SVG- diagonal, SVG-OM   EYE SURGERY     BIL CATARACT   KIDNEY STONE SURGERY     KNEE ARTHROSCOPY     RT  KNEE   LEFT HEART CATH AND CORS/GRAFTS ANGIOGRAPHY  11/2006   High-grade distal RCA and PDA stenosis --> BMS PCI.    LEFT HEART CATH AND CORS/GRAFTS ANGIOGRAPHY  2010   Native coronaries at 70% to 80% ostial left main. LAD had diffuse 80% to 90% stenosis   NM MYOVIEW LTD  02/25/2004; Jan 2015   a) EF 57%; b)  Normal Nuclear Stress Test - No evidence of Ischemia or Infarction. Normal LV Function & wall motion.   NM MYOVIEW LTD  03/17/2017   No significant reversible ischemia. Increased TID ratio 1.34. LVEF 54% with normal wall motion. This is a low risk study.   POSTERIOR CERVICAL FUSION/FORAMINOTOMY N/A 07/14/2017   Procedure: LAMINECTOMY CERVICAL THREE- CERVICAL FOUR WITH LATERAL MASS FUSION;  Surgeon: Consuella Lose, MD;  Location: [place];  Service: Neurosurgery;  Laterality: N/A;  LAMINECTOMY CERVICAL 3- CERVICAL 4 WITH LATERAL MASS FUSION   TOTAL KNEE ARTHROPLASTY Right 04/07/2020   Procedure: TOTAL KNEE ARTHROPLASTY;  Surgeon: Vickey Huger, MD;  Location: [facility];  Service: Orthopedics;  Laterality: Right;  Cath 08/2015: Stable from 2010 -> essentially atretic LIMA.  LAD fills via SVG-diagonal      There is no immunization history on file for this patient.  MEDICATIONS/ALLERGIES   Current Meds  Medication Sig   acetaminophen (TYLENOL) 500 MG tablet Take 1,000 mg by  mouth every 8 (eight) hours as needed for mild pain.   azelastine (ASTELIN) 0.1 % nasal spray Place 1-2 sprays into both nostrils 2 (two) times daily.   cholecalciferol (VITAMIN D) 1000 units tablet Take 1,000 Units by mouth daily.   clindamycin (CLEOCIN) 300 MG capsule Take 600 mg by mouth 2 (two) times daily.   clopidogrel (PLAVIX) 75 MG tablet TAKE 1 TABLET(75 MG) BY MOUTH DAILY   lisinopril (ZESTRIL) 10 MG tablet Take 10 mg in the morning  and 5 mg( 0.5 tablet) in the evening , may take an additional 0.5  tablet if blood pressure is greater than 180/90 => currently taking 10 mg twice daily   metoprolol succinate (TOPROL-XL) 50 MG 24 hr tablet TAKE 1 TABLET(50 MG) BY MOUTH IN THE MORNING AND AT BEDTIME   montelukast (SINGULAIR) 10 MG tablet TAKE 1 TABLET(10 MG) BY MOUTH AT BEDTIME   Multiple Vitamin (MULTIVITAMIN WITH MINERALS) TABS tablet Take 1 tablet by mouth daily.   nitroGLYCERIN (NITROSTAT) 0.4 MG SL tablet PLACE 1 TABLET UNDER THE TONGUE EVERY 5 MINUTES AS NEEDED FOR CHEST PAIN FOR 3 DOSES. IF NOT RELIEF AFTER FIRST DOSE, CALL PRESCRIBER OR 911.   Polyethylene Glycol 3350 (MIRALAX PO) 1 scoop bid Oral as needed   tamsulosin (FLOMAX) 0.4 MG CAPS capsule Take 0.4 mg by mouth at bedtime.    traMADol (ULTRAM) 50 MG tablet Take 50-100 mg by mouth 3 (three) times daily as needed.   triamcinolone (NASACORT) 55 MCG/ACT AERO nasal inhaler Place 1 spray into the nose daily.   vitamin B-12 (CYANOCOBALAMIN) 1000 MCG tablet Take 1,000 mcg by mouth 2 (two) times daily.    vitamin C (ASCORBIC ACID) 250 MG tablet Take 250 mg by mouth daily.   zolpidem (AMBIEN) 10 MG tablet Take 10 mg by mouth at bedtime as needed for sleep.  No longer taking amlodipine  Allergies  Allergen Reactions   Penicillins Swelling and Other (See Comments)    Swelling and redness in joints  Has patient had a PCN reaction causing immediate rash, facial/tongue/throat swelling, SOB or lightheadedness with hypotension: No Has patient had a PCN reaction causing severe rash involving mucus membranes or skin necrosis: No Has patient had a PCN reaction that required hospitalization: No Has patient had a PCN reaction occurring within the last 10 years: No If all of the above answers are "NO", then may proceed with Cephalosporin use.    Sulfa Antibiotics Other (See Comments)    Pain in left side underneath ribcage-pancreas   Statins Other (See Comments)    UNSPECIFIED REACTION  "INTOLERANCE AT HIGH DOSES"    SOCIAL HISTORY/FAMILY  HISTORY   Reviewed in Epic:  Pertinent findings:  Social History   Tobacco Use   Smoking status: Never   Smokeless tobacco: Never  Vaping Use   Vaping Use: Never used  Substance Use Topics   Alcohol use: No   Drug use: No   Social History   Social History Narrative   He is a widowed father of 2, grandfather 33. He is retired from [employer], Programme researcher, broadcasting/film/video.    His wife Meryl Crutch died in 07/05/19   He now lives alone in a one story home.   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  He is usually very active, but is limited as far as standing exercise by his knee osteoarthritis.   He does not drink and does not smoke. Never  smoked.   Education: college.     OBJCTIVE -PE, EKG, labs   Wt Readings from Last 3 Encounters:  10/27/21 195 lb 6.4 oz (88.6 kg)  08/02/21 194 lb (88 kg)  04/27/21 197 lb (89.4 kg)    Physical Exam: BP 130/68   Pulse 60   Ht 6\' 3"  (1.905 m)   Wt 195 lb 6.4 oz (88.6 kg)   SpO2 99%   BMI 24.42 kg/m  Physical Exam Vitals reviewed.  Constitutional:      General: He is not in acute distress.    Appearance: Normal appearance. He is normal weight. He is not ill-appearing or toxic-appearing.     Comments: Well-nourished and well-groomed.  Tall and thin but healthy-appearing.  HENT:     Head: Normocephalic and atraumatic.  Neck:     Vascular: No carotid bruit.  Cardiovascular:     Rate and Rhythm: Normal rate and regular rhythm.     Pulses: Normal pulses.     Heart sounds: Normal heart sounds. No murmur heard.   No friction rub. No gallop.  Pulmonary:     Effort: Pulmonary effort is normal. No respiratory distress.     Breath sounds: Normal breath sounds. No wheezing, rhonchi or rales.  Chest:     Chest wall: No tenderness.  Musculoskeletal:        General: Swelling (Trivial) present. Normal range of motion.     Cervical back: Normal range of motion and neck supple.   Skin:    General: Skin is warm and dry.  Neurological:     General: No focal deficit present.     Mental Status: He is alert and oriented to person, place, and time.     Gait: Gait abnormal (Walks with a cane, favoring left knee).  Psychiatric:        Mood and Affect: Mood normal.        Behavior: Behavior normal.        Thought Content: Thought content normal.        Judgment: Judgment normal.    Adult ECG Report  Rate: 60;  Rhythm: normal sinus rhythm and normal axis, intervals and durations. ;   Narrative Interpretation: Stable  Recent Labs:   01/26/2021: TC 178, TG 191, HDL 33, LDL 107. 10/12/2021: A1c 6.2.  Lab Results  Component Value Date   CREATININE 0.90 08/02/2021   BUN 23 08/02/2021   NA 138 08/02/2021   K 4.5 08/02/2021   CL 102 08/02/2021   CO2 29 08/02/2021      Latest Ref Rng & Units 08/02/2021    7:19 PM 03/08/2021   11:16 AM 02/03/2021    1:45 PM  CBC  WBC 4.0 - 10.5 K/uL 7.4   11.3   7.2    Hemoglobin 13.0 - 17.0 g/dL 13.8   13.3   13.4    Hematocrit 39.0 - 52.0 % 42.2   40.4   41.2    Platelets 150 - 400 K/uL 246   241   210       Lab Results  Component Value Date   TSH 0.081 (L) 08/02/2021    ==================================================  COVID-19 Education: The signs and symptoms of COVID-19 were discussed with the patient and how to seek care for testing (follow up with PCP or arrange E-visit).    I spent a total of 32 minutes with the patient spent in direct patient consultation.  Additional time spent with chart review  / charting (studies, outside  notes, etc): 30 min Total Time: 62 min  Current medicines are reviewed at length with the patient today.  (+/- concerns) N/A  This visit occurred during the SARS-CoV-2 public health emergency.  Safety protocols were in place, including screening questions prior to the visit, additional usage of staff PPE, and extensive cleaning of exam room while observing appropriate contact time as  indicated for disinfecting solutions.  Notice: This dictation was prepared with Dragon dictation along with smart phrase technology. Any transcriptional errors that result from this process are unintentional and may not be corrected upon review.  Studies Ordered:   Orders Placed This Encounter  Procedures   EKG 12-Lead   No orders of the defined types were placed in this encounter.   Patient Instructions / Medication Changes & Studies & Tests Ordered   Patient Instructions  Medication Instructions:    Try to  decrease dose back  Lisinopril 10 mg in morning and 5 mg in the evening  If blood pressure  is  greater than systolic ( top number [id number] /? Then take additional Amlodipine and a Nitroglycerin sublingual tablet.  *If you need a refill on your cardiac medications before your next appointment, please call your pharmacy*   Lab Work:  Not needed   Testing/Procedures: Not needed   Follow-Up: At [facility], you and your health needs are our priority.  As part of our continuing mission to provide you with exceptional heart care, we have created designated Provider Care Teams.  These Care Teams include your primary Cardiologist (physician) and Advanced Practice Providers (APPs -  Physician Assistants and Nurse Practitioners) who all work together to provide you with the care you need, when you need it.     Your next appointment:   6 month(s)  The format for your next appointment:   In Person  Provider:   Glenetta Hew, MD    Other Instructions      Glenetta Hew, M.D., M.S. Interventional Cardiologist   Pager # [phone] Phone # [phone] [address] [place], [place] [address]   Thank you for choosing Heartcare at [facility]!!

## 2021-10-30 ENCOUNTER — Encounter: Payer: Self-pay | Admitting: Cardiology

## 2021-10-30 NOTE — Assessment & Plan Note (Signed)
More noting fatigue and deconditioning and dyspnea now.

## 2021-10-30 NOTE — Assessment & Plan Note (Signed)
Long history of CAD having CABG.  Unfortunately his LIMA became atretic with his SVG-diagonal filling the LAD via retrograde flow.  He has a patent SVG-OM and patent stent in the PDA by cardiac catheterization in 2017.[name]  He has done well with no active anginal symptoms.  Per our discussion, we will hold off on screening/surveillance stress test unless he has symptoms.  He remains on longstanding Plavix for combination of CAD as well as retinal artery occlusion. Intolerant of lipid-lowering agents and not willing to try injection medication such as PCSK9 inhibitors or even inclisiran.  Labile blood pressure being managed as mentioned.

## 2021-10-30 NOTE — Assessment & Plan Note (Signed)
Symptoms well controlled with Toprol twice daily 50 mg

## 2021-10-30 NOTE — Assessment & Plan Note (Addendum)
Has refused to take any statins and does not want to "test any other types of cholesterol lowering medications " Very fearful of side effects.  He understands the risk but also states that he is 88 years old.

## 2021-10-30 NOTE — Assessment & Plan Note (Signed)
Difficult to really figure out what his fatigue is related to I think is probably multifactorial and mostly related to his age and lack of mobility now with his back and knee pain as well as hip pain.  We did reduce his Toprol dose in the past, but I am reluctant to do so now that he has had these blood pressure spikes.  Please encourage him to continue to try to walk and maintain activity level.

## 2021-10-30 NOTE — Assessment & Plan Note (Signed)
Would be due for repeat ischemic evaluation this year as his last Myoview was in 2018.  Per discussion, we will hold off on further testing unless he has symptoms concerning for angina.

## 2021-10-30 NOTE — Assessment & Plan Note (Signed)
Intermittent episodes of blood pressures in the 200 range with other times having orthostatic dizziness.  Difficult to manage.  We have split his medications into twice daily dosing to help with this.  He has not really had a spell few months ago but his blood pressure is better now.  He still has some amlodipine at home which could also be used as a PRN medication for high pressures.   Target blood pressure range for him is anywhere from 120 systolic to 170 systolic.  If he were to stop rest and recheck his blood pressure 30 minutes to an hour after he checks his pressure initially, if back in his zone, would probably not treat.  For now, okay to continue his current dose of Toprol and try to reduce it back to 10 mg a.m. and 5 mg p.m. dosing of lisinopril.  If his pressures creep up he will go back to 10 mg twice daily.  For breakthrough hypertension spells (standing blood pressures > 180-200 mmHg) he will take a couple of nitroglycerin and 5 mg amlodipine

## 2021-11-01 ENCOUNTER — Other Ambulatory Visit: Payer: Self-pay | Admitting: Allergy and Immunology

## 2021-11-05 ENCOUNTER — Other Ambulatory Visit: Payer: Self-pay | Admitting: Allergy and Immunology

## 2021-11-06 ENCOUNTER — Encounter: Payer: Self-pay | Admitting: Physical Medicine and Rehabilitation

## 2021-11-06 ENCOUNTER — Ambulatory Visit: Payer: Medicare Other | Admitting: Physical Medicine and Rehabilitation

## 2021-11-06 DIAGNOSIS — R531 Weakness: Secondary | ICD-10-CM

## 2021-11-06 DIAGNOSIS — R202 Paresthesia of skin: Secondary | ICD-10-CM | POA: Diagnosis not present

## 2021-11-06 NOTE — Progress Notes (Signed)
Pt state left knee and foot numbness. Pt state he takes over the counter pain meds to help ease his pain.  Numeric Pain Rating Scale and Functional Assessment Average Pain 4   In the last MONTH (on 0-10 scale) has pain interfered with the following?  1. General activity like being  able to carry out your everyday physical activities such as walking, climbing stairs, carrying groceries, or moving a chair?  Rating(7)   -BT, -Dye Allergies.

## 2021-11-18 ENCOUNTER — Telehealth: Payer: Self-pay | Admitting: Physical Medicine and Rehabilitation

## 2021-11-18 NOTE — Telephone Encounter (Signed)
Received vm from [facility] requesting 06/09/21 L-Spine MRI report. I faxed [phone]

## 2021-12-17 ENCOUNTER — Telehealth: Payer: Self-pay | Admitting: Physical Medicine and Rehabilitation

## 2021-12-17 NOTE — Telephone Encounter (Signed)
Pt called to set an appt for follow up after physical therapy sessions being finished. Pt phone number is [phone].

## 2021-12-24 ENCOUNTER — Ambulatory Visit: Payer: Medicare Other | Admitting: Physical Medicine and Rehabilitation

## 2021-12-24 ENCOUNTER — Encounter: Payer: Self-pay | Admitting: Physical Medicine and Rehabilitation

## 2021-12-24 VITALS — BP 145/66 | HR 74

## 2021-12-24 DIAGNOSIS — M4726 Other spondylosis with radiculopathy, lumbar region: Secondary | ICD-10-CM

## 2021-12-24 DIAGNOSIS — M961 Postlaminectomy syndrome, not elsewhere classified: Secondary | ICD-10-CM

## 2021-12-24 DIAGNOSIS — M5116 Intervertebral disc disorders with radiculopathy, lumbar region: Secondary | ICD-10-CM

## 2021-12-24 DIAGNOSIS — R269 Unspecified abnormalities of gait and mobility: Secondary | ICD-10-CM

## 2021-12-24 DIAGNOSIS — M5416 Radiculopathy, lumbar region: Secondary | ICD-10-CM | POA: Diagnosis not present

## 2021-12-24 DIAGNOSIS — M47816 Spondylosis without myelopathy or radiculopathy, lumbar region: Secondary | ICD-10-CM

## 2021-12-24 NOTE — Progress Notes (Signed)
Daniel [name] - 88 y.o. male MRN [id number]  Date of birth: [birth year]  Office Visit Note: Visit Date: 12/24/2021 PCP: Geoffry Paradise, MD Referred by: Geoffry Paradise, MD  Subjective: Chief Complaint  Patient presents with   Lower Back - Pain   Right Knee - Pain   Left Knee - Pain   HPI: Daniel [name] is a 88 y.o. male who comes in today for evaluation of chronic, worsening and severe bilateral lower back pain radiating to hips, right greater than left. Patient reports pain has been ongoing for several years and is exacerbated by movement and activity, he describes pain as a sore and aching sensation, currently rates as 7 out of 10. He reports some relief of pain with home exercise regimen, rest and use of medications. Patient recently finished formal physical therapy at [facility] where he underwent dry needling treatments, reports minimal short term relief with these treatments. Patients lumbar MRI from January of this year exhibits previous laminectomy decompression at L4-L5, there is also grade 1 anterolisthesis, facet arthropathy, mild central canal stenosis and lateral recess/foraminal narrowing noted at this level. There is also facet arthropathy and mild spinal canal stenosis noted at L5-S1. Patient does have a history of central decompressive laminectomy L4-L5 in 2010 by Dr. Vira Browns. Patient states he had multiple lumbar epidural injections performed at [facility] prior to surgery and states that these injections did help to alleviate his pain, however pain relief from short lived.   Patient did have bilateral L4-L5 and L5-S1 radiofrequency ablation performed in our office in April of this year with some relief of axial back pain, however relief was short lived. Patient did have issues with paraesthesias to entire left leg immediately after procedure, however this resolved in a day or so. Patient did have NCV with EMG in 2018 by Dr. Allena Katz and more recently in our  office that was indicative of polyneuropathy. Patient currently denies any issues with numbness/tingling. Patient states he is a very active person, works outside frequently, mows yard and works in garden. Patient continues to ambulate with cane as needed. Patient denies focal weakness. Patient denies recent trauma or falls.   Review of Systems  Musculoskeletal:  Positive for back pain.  Neurological:  Negative for tingling, sensory change, focal weakness and weakness.  All other systems reviewed and are negative.  Otherwise per HPI.  Assessment & Plan: Visit Diagnoses:    ICD-10-CM   1. Lumbar radiculopathy  M54.16 Ambulatory referral to Physical Medicine Rehab    2. Intervertebral disc disorders with radiculopathy, lumbar region  M51.16 Ambulatory referral to Physical Medicine Rehab    3. Other spondylosis with radiculopathy, lumbar region  M47.26 Ambulatory referral to Physical Medicine Rehab    4. Facet hypertrophy of lumbar region  M47.816 Ambulatory referral to Physical Medicine Rehab    5. Post laminectomy syndrome  M96.1 Ambulatory referral to Physical Medicine Rehab    6. Gait abnormality  R26.9 Ambulatory referral to Physical Medicine Rehab       Plan: Findings:  1. Chronic, worsening and severe bilateral lower back pain radiating to hips, right greater than left. Patient continues to have severe pain despite good conservative therapies such as formal physical therapy, dry needling, home exercise regimen, rest and use of medications. Patients clinical presentation and exam are somewhat consistent with L4 nerve pattern.  Plan is to perform bilateral L4 transforaminal epidural steroid injection under fluoroscopic guidance. I did explain lumbar epidural steroid injection  procedure with patient in detail today, he has no questions at this time. No red flag symptoms noted upon exam today.  Despite good diagnostic medial branch blocks that were successful in relieving his pain  temporarily the radiofrequency ablation is only relieved his back pain a small percentage.  2. Paraesthesias to left leg 1 week after radiofrequency ablation in April that did resolve within a within a few weeks. No complaints of paraesthesias at this time.  Patient had prior electrodiagnostic study by Dr. Narda Amber in 2018 that showed mostly axonal peripheral polyneuropathy but also underlying S1 radiculopathy.  More recent electrodiagnostic study by Dr. Ernestina Patches did show peripheral polyneuropathy similar without any real evidence of radiculopathy or fibular nerve entrapment.    Meds & Orders: No orders of the defined types were placed in this encounter.   Orders Placed This Encounter  Procedures   Ambulatory referral to Physical Medicine Rehab    Follow-up: Return for Bilateral L4 transforaminal epidural steroid injection.   Procedures: No procedures performed      Clinical History: MRI lumbar spine:   TECHNIQUE: Sagittal and axial T1 and T2-weighted sequences were performed. Additional sagittal STIR images were performed.   INDICATION: Back pain   COMPARISON: None   FINDINGS:  #  5 mm grade 1 anterolisthesis L4-5 with minimal grade 1 anterolisthesis is L5-S1.  #  Vertebral body heights are well maintained.  #  Trace type stress I Modic endplate change [id number] and type II at L4-5  #  Conus terminates at T12-L1 without evidence of tethering.  #  Nerve roots appear normal.  #  Incidental findings: None.    #  L1-2: Mild degenerative disc disease. Canal is well-maintained. Neuroforamina patent.  #    #  L2-3: Mild degenerative disc disease. Central canal is well-maintained. Neural foramina are patent.  #    #  L3-4: Mild degenerative disc disease. There is facet and disc bulge causing mild central canal stenosis with mild bilateral foraminal narrowing.  #    #  L4-5: Severe degenerative disc disease. Grade 1 anterolisthesis and laminectomy decompression with facet arthropathy  and disc uncovering causing mild central canal and lateral recess stenosis and moderate bilateral foraminal narrowing  #    #  L5-S1: Grade 1 anterolisthesis. Facet arthropathy and disc bulge causes mild central canal stenosis and mild bilateral foraminal narrowing.    IMPRESSION:   1.  L4-5 severe degenerative disc disease with grade 1 anterolisthesis and laminectomy decompression. There is mild residual central canal lateral recess stenosis.   2.  L5-S1 minimal grade 1 anterolisthesis contributes to mild central canal stenosis   Electronically Signed by: Ritta Slot on 06/10/2021 1:58 PM   He reports that he has never smoked. He has never used smokeless tobacco. No results for input(s): "HGBA1C", "LABURIC" in the last 8760 hours.  Objective:  VS:  HT:    WT:   BMI:     BP:(!) 145/66  HR:74bpm  TEMP: ( )  RESP:  Physical Exam Vitals and nursing note reviewed.  HENT:     Head: Normocephalic and atraumatic.     Right Ear: External ear normal.     Left Ear: External ear normal.     Nose: Nose normal.     Mouth/Throat:     Mouth: Mucous membranes are moist.  Eyes:     Extraocular Movements: Extraocular movements intact.  Cardiovascular:     Rate and Rhythm: Normal rate.     Pulses: Normal  pulses.  Pulmonary:     Effort: Pulmonary effort is normal.  Abdominal:     General: Abdomen is flat. There is no distension.  Musculoskeletal:        General: Tenderness present.     Cervical back: Normal range of motion.     Comments: Pt is slow to rise from seated position to standing. Good lumbar range of motion. Strong distal strength without clonus, no pain upon palpation of greater trochanters. Dysesthesias noted to bilateral L4 dermatomes. Sensation intact bilaterally. Ambulates with cane, gait slow.   Skin:    General: Skin is warm and dry.     Capillary Refill: Capillary refill takes less than 2 seconds.  Neurological:     Mental Status: He is alert and oriented to person,  place, and time.     Gait: Gait abnormal.  Psychiatric:        Mood and Affect: Mood normal.        Behavior: Behavior normal.     Ortho Exam  Imaging: No results found.  Past Medical/Family/Surgical/Social History: Medications & Allergies reviewed per EMR, new medications updated. Patient Active Problem List   Diagnosis Date Noted   Myalgia due to statin 05/22/2021   Branch retinal vein occlusion of left eye with macular edema 09/16/2020   Chronic pain of right knee 03/19/2020   Hypertensive retinopathy of both eyes 11/13/2017   Stenosis of cervical spine with myelopathy ([place]) 07/14/2017   Pseudophakia of both eyes 05/24/2017   Stable branch retinal vein occlusion of right eye 05/24/2017   DOE (dyspnea on exertion) 03/10/2017   Acute maxillary sinusitis 02/15/2017   Allergic rhinitis 02/15/2017   Preoperative cardiovascular examination 08/16/2016   Chronic pain of both knees 07/13/2016   Fatigue due to treatment 09/11/2015   Unstable angina ([place]) 08/06/2015   Low TSH level 08/13/2014   Orthostatic hypotension 12/17/2013   Pseudoptosis 05/22/2013   Dermatochalasis 02/21/2013   Epiphora 02/21/2013   S/P CABG x 3    Labile essential hypertension    Dyslipidemia, goal LDL below 70    Heart palpitations    Nuclear cataract 01/26/2012   PCO (posterior capsular opacification) 01/26/2012   Pseudophakia 01/26/2012   LM-CAD - atretic LIMA-LAD, patent SVG-D2-> fills LAD & SVG-OM, BMS PCI rPDA 07/27/1995   Past Medical History:  Diagnosis Date   Arthritis    CAD of autologous arterial graft 2010   Atretic LIMA; Myoview Jan 2015: Possible mild basal anteroseptal defect thought to be artifact (CRO early LAD ischemia due to inadeuate retrograde perfusion via SVG-Diag   CAD S/P percutaneous coronary angioplasty 11/2006   a. 6/'08: PCI nongrafted distal RCA-PDA: Mini vision BMS 2.25 mm x 28 mm; b. Relook Cath 08/2015: Stable CAD. No culprit lesion. Widely patent PDA stent. Patent  SVG-OM 2, SVG-D2. Essentially atretic/small LIMA-LAD, but the LAD is perfused via SVG-D2   Complication of anesthesia    Coronary artery disease involving left main coronary artery [id number]   Last cath 2010: 70-80 % ostial left main, 80-90% LAD, subtotal CTO LCx-OM2, RCA patent w/ patent BMS stent (dRCA-PDA); SVG-diagonal backfills LAD, atretic LIMA-LAD -- medical therapy; stable by relook cath in March 2017   Difficult intubation    VERY NARROW AIRWAY PER ANESTHESIOLOGIST (1997 CABG AT [facility])   Dyslipidemia, goal LDL below 70    Dysrhythmia    Heart palpitations    Never fully diagnosed.   History of kidney stones    Hypertension    Insomnia  Mass in chest    JUST WATCHING , WILL CHECK OUT AFTER SURGERY   Pneumonia    S/P CABG x 3 1997   LIMA-LAD, SVG-D2, SVG-OM --> LIMA known to be atretic, but SVG-D2 fills diagonal and LAD   Stenosis of cervical spine with myelopathy ([facility])    Tingling    in fingers   Family History  Problem Relation Age of Onset   Stroke Mother 13   Heart disease Mother    Arthritis Mother    Heart attack Father 58   Other Sister    Heart disease Brother    Allergic rhinitis Neg Hx    Atopy Neg Hx    Angioedema Neg Hx    Asthma Neg Hx    Eczema Neg Hx    Immunodeficiency Neg Hx    Urticaria Neg Hx    Past Surgical History:  Procedure Laterality Date   48-HOUR MONITOR  03/2017   Relatively normal.  Very infrequent ectopy.  Short PAT run with occasional PACs/PVCs and bigeminy.  No symptoms noted on monitor.  Longest PAT runs were less than 10 beats.   BACK SURGERY     decompression  lower back    CARDIAC CATHETERIZATION N/A 08/08/2015   Procedure: Left Heart Cath and Cors/Grafts Angiography;  Surgeon: Marykay Lex, MD;  Location: [facility] INVASIVE CV LAB;; -->  LM 80%-70%. Ost LAD 80% then prox-mid 100% (after small D1). Small LCx & OM2 patent with 100% OM2. Prox & distal RCA 20%, patent BMS in distal RCA-rPDA.  Patent/atretic LIMA-dLAD, SVG-D2 (backdills  entire dLAD) & SVG-OM2.    COLONOSCOPY     CORONARY ANGIOPLASTY WITH STENT PLACEMENT  [facility]b 2008   BMS PCI of distal RCA-RPDA: PCI with a 2.25 mm x 28 mm Mini Vision bare-metal stent.   CORONARY ARTERY BYPASS GRAFT  [id number]   LIMA - LAD, SVG- diagonal, SVG-OM   EYE SURGERY     BIL CATARACT   KIDNEY STONE SURGERY     KNEE ARTHROSCOPY     RT  KNEE   LEFT HEART CATH AND CORS/GRAFTS ANGIOGRAPHY  11/2006   High-grade distal RCA and PDA stenosis --> BMS PCI.    LEFT HEART CATH AND CORS/GRAFTS ANGIOGRAPHY  2010   Native coronaries at 70% to 80% ostial left main. LAD had diffuse 80% to 90% stenosis   NM MYOVIEW LTD  02/25/2004; Jan 2015   a) EF 57%; b)  Normal Nuclear Stress Test - No evidence of Ischemia or Infarction. Normal LV Function & wall motion.   NM MYOVIEW LTD  03/17/2017   No significant reversible ischemia. Increased TID ratio 1.34. LVEF 54% with normal wall motion. This is a low risk study.   POSTERIOR CERVICAL FUSION/FORAMINOTOMY N/A 07/14/2017   Procedure: LAMINECTOMY CERVICAL THREE- CERVICAL FOUR WITH LATERAL MASS FUSION;  Surgeon: Lisbeth Renshaw, MD;  Location: [facility] OR;  Service: Neurosurgery;  Laterality: N/A;  LAMINECTOMY CERVICAL 3- CERVICAL 4 WITH LATERAL MASS FUSION   TOTAL KNEE ARTHROPLASTY Right 04/07/2020   Procedure: TOTAL KNEE ARTHROPLASTY;  Surgeon: Dannielle Huh, MD;  Location: [facility] ORS;  Service: Orthopedics;  Laterality: Right;   Social History   Occupational History   Occupation: retired Psychologist, educational  Tobacco Use   Smoking status: Never   Smokeless tobacco: Never  Vaping Use   Vaping Use: Never used  Substance and Sexual Activity   Alcohol use: No   Drug use: No   Sexual activity: Not on file

## 2021-12-24 NOTE — Progress Notes (Signed)
Pt state lower back pain. Pt state he does have some pain in both knees. Pt state walking and standing for a long period of time makes the pain worse. Pt state he takes pain meds to help ease his pain.  Numeric Pain Rating Scale and Functional Assessment Average Pain 9 Pain Right Now 2 My pain is constant, dull, and aching Pain is worse with: walking, standing, and some activites Pain improves with: therapy/exercise, medication, and injections   In the last MONTH (on 0-10 scale) has pain interfered with the following?  1. General activity like being  able to carry out your everyday physical activities such as walking, climbing stairs, carrying groceries, or moving a chair?  Rating(5)  2. Relation with others like being able to carry out your usual social activities and roles such as  activities at home, at work and in your community. Rating(6)  3. Enjoyment of life such that you have  been bothered by emotional problems such as feeling anxious, depressed or irritable?  Rating(7)

## 2021-12-28 ENCOUNTER — Ambulatory Visit: Payer: Self-pay

## 2021-12-28 ENCOUNTER — Encounter: Payer: Self-pay | Admitting: Physical Medicine and Rehabilitation

## 2021-12-28 ENCOUNTER — Ambulatory Visit: Payer: Medicare Other | Admitting: Physical Medicine and Rehabilitation

## 2021-12-28 VITALS — BP 134/74 | HR 59

## 2021-12-28 DIAGNOSIS — M5416 Radiculopathy, lumbar region: Secondary | ICD-10-CM

## 2021-12-28 MED ORDER — METHYLPREDNISOLONE ACETATE 80 MG/ML IJ SUSP
80.0000 mg | Freq: Once | INTRAMUSCULAR | Status: AC
  Administered 2021-12-28: 80 mg

## 2021-12-28 NOTE — Patient Instructions (Signed)

## 2021-12-28 NOTE — Progress Notes (Signed)
Pt state lower back pain. Pt state he does have some pain in both knees. Pt state walking and standing for a long period of time makes the pain worse. Pt state he takes pain meds to help ease his pain.  Numeric Pain Rating Scale and Functional Assessment Average Pain 2   In the last MONTH (on 0-10 scale) has pain interfered with the following?  1. General activity like being  able to carry out your everyday physical activities such as walking, climbing stairs, carrying groceries, or moving a chair?  Rating(8)   +Driver, +BT, -Dye Allergies.

## 2022-01-04 NOTE — Procedures (Signed)
Lumbosacral Transforaminal Epidural Steroid Injection - Sub-Pedicular Approach with Fluoroscopic Guidance  Patient: Daniel [name]      Date of Birth: 11/12/1932 MRN: [id number] PCP: Geoffry Paradise, MD      Visit Date: 12/28/2021   Universal Protocol:    Date/Time: 12/28/2021  Consent Given By: the patient  Position: PRONE  Additional Comments: Vital signs were monitored before and after the procedure. Patient was prepped and draped in the usual sterile fashion. The correct patient, procedure, and site was verified.   Injection Procedure Details:   Procedure diagnoses: Lumbar radiculopathy [M54.16]    Meds Administered:  Meds ordered this encounter  Medications   methylPREDNISolone acetate (DEPO-MEDROL) injection 80 mg    Laterality: Bilateral  Location/Site: L4  Needle:5.0 in., 22 ga.  Short bevel or Quincke spinal needle  Needle Placement: Transforaminal  Findings:    -Comments: Excellent flow of contrast along the nerve, nerve root and into the epidural space.  Procedure Details: After squaring off the end-plates to get a true AP view, the C-arm was positioned so that an oblique view of the foramen as noted above was visualized. The target area is just inferior to the "nose of the scotty dog" or sub pedicular. The soft tissues overlying this structure were infiltrated with 2-3 ml. of 1% Lidocaine without Epinephrine.  The spinal needle was inserted toward the target using a "trajectory" view along the fluoroscope beam.  Under AP and lateral visualization, the needle was advanced so it did not puncture dura and was located close the 6 O'Clock position of the pedical in AP tracterory. Biplanar projections were used to confirm position. Aspiration was confirmed to be negative for CSF and/or blood. A 1-2 ml. volume of Isovue-250 was injected and flow of contrast was noted at each level. Radiographs were obtained for documentation purposes.   After attaining the desired  flow of contrast documented above, a 0.5 to 1.0 ml test dose of 0.25% Marcaine was injected into each respective transforaminal space.  The patient was observed for 90 seconds post injection.  After no sensory deficits were reported, and normal lower extremity motor function was noted,   the above injectate was administered so that equal amounts of the injectate were placed at each foramen (level) into the transforaminal epidural space.   Additional Comments:  The patient tolerated the procedure well Dressing: 2 x 2 sterile gauze and Band-Aid    Post-procedure details: Patient was observed during the procedure. Post-procedure instructions were reviewed.  Patient left the clinic in stable condition.

## 2022-01-04 NOTE — Progress Notes (Signed)
Daniel [name] - 88 y.o. male MRN [id number]  Date of birth: Nov 04, 1932  Office Visit Note: Visit Date: 12/28/2021 PCP: Burnard Bunting, MD Referred by: Burnard Bunting, MD  Subjective: Chief Complaint  Patient presents with   Lower Back - Pain   HPI:  Daniel [name] is a 88 y.o. male who comes in today at the request of Barnet Pall, FNP for planned Bilateral L4-5 Lumbar Transforaminal epidural steroid injection with fluoroscopic guidance.  The patient has failed conservative care including home exercise, medications, time and activity modification.  This injection will be diagnostic and hopefully therapeutic.  Please see requesting physician notes for further details and justification.   ROS Otherwise per HPI.  Assessment & Plan: Visit Diagnoses:    ICD-10-CM   1. Lumbar radiculopathy  M54.16 XR C-ARM NO REPORT    Epidural Steroid injection    methylPREDNISolone acetate (DEPO-MEDROL) injection 80 mg      Plan: No additional findings.   Meds & Orders:  Meds ordered this encounter  Medications   methylPREDNISolone acetate (DEPO-MEDROL) injection 80 mg    Orders Placed This Encounter  Procedures   XR C-ARM NO REPORT   Epidural Steroid injection    Follow-up: Return for visit to requesting provider as needed.   Procedures: No procedures performed  Lumbosacral Transforaminal Epidural Steroid Injection - Sub-Pedicular Approach with Fluoroscopic Guidance  Patient: Daniel [name]      Date of Birth: Oct 24, 1932 MRN: [id number] PCP: Burnard Bunting, MD      Visit Date: 12/28/2021   Universal Protocol:    Date/Time: 12/28/2021  Consent Given By: the patient  Position: PRONE  Additional Comments: Vital signs were monitored before and after the procedure. Patient was prepped and draped in the usual sterile fashion. The correct patient, procedure, and site was verified.   Injection Procedure Details:   Procedure diagnoses: Lumbar radiculopathy [M54.16]    Meds  Administered:  Meds ordered this encounter  Medications   methylPREDNISolone acetate (DEPO-MEDROL) injection 80 mg    Laterality: Bilateral  Location/Site: L4  Needle:5.0 in., 22 ga.  Short bevel or Quincke spinal needle  Needle Placement: Transforaminal  Findings:    -Comments: Excellent flow of contrast along the nerve, nerve root and into the epidural space.  Procedure Details: After squaring off the end-plates to get a true AP view, the C-arm was positioned so that an oblique view of the foramen as noted above was visualized. The target area is just inferior to the "nose of the scotty dog" or sub pedicular. The soft tissues overlying this structure were infiltrated with 2-3 ml. of 1% Lidocaine without Epinephrine.  The spinal needle was inserted toward the target using a "trajectory" view along the fluoroscope beam.  Under AP and lateral visualization, the needle was advanced so it did not puncture dura and was located close the 6 O'Clock position of the pedical in AP tracterory. Biplanar projections were used to confirm position. Aspiration was confirmed to be negative for CSF and/or blood. A 1-2 ml. volume of Isovue-250 was injected and flow of contrast was noted at each level. Radiographs were obtained for documentation purposes.   After attaining the desired flow of contrast documented above, a 0.5 to 1.0 ml test dose of 0.25% Marcaine was injected into each respective transforaminal space.  The patient was observed for 90 seconds post injection.  After no sensory deficits were reported, and normal lower extremity motor function was noted,   the above injectate was administered so  that equal amounts of the injectate were placed at each foramen (level) into the transforaminal epidural space.   Additional Comments:  The patient tolerated the procedure well Dressing: 2 x 2 sterile gauze and Band-Aid    Post-procedure details: Patient was observed during the  procedure. Post-procedure instructions were reviewed.  Patient left the clinic in stable condition.    Clinical History: MRI lumbar spine:   TECHNIQUE: Sagittal and axial T1 and T2-weighted sequences were performed. Additional sagittal STIR images were performed.   INDICATION: Back pain   COMPARISON: None   FINDINGS:  #  5 mm grade 1 anterolisthesis L4-5 with minimal grade 1 anterolisthesis is L5-S1.  #  Vertebral body heights are well maintained.  #  Trace type stress I Modic endplate change L3-4 and type II at L4-5  #  Conus terminates at T12-L1 without evidence of tethering.  #  Nerve roots appear normal.  #  Incidental findings: None.    #  L1-2: Mild degenerative disc disease. Canal is well-maintained. Neuroforamina patent.  #    #  L2-3: Mild degenerative disc disease. Central canal is well-maintained. Neural foramina are patent.  #    #  L3-4: Mild degenerative disc disease. There is facet and disc bulge causing mild central canal stenosis with mild bilateral foraminal narrowing.  #    #  L4-5: Severe degenerative disc disease. Grade 1 anterolisthesis and laminectomy decompression with facet arthropathy and disc uncovering causing mild central canal and lateral recess stenosis and moderate bilateral foraminal narrowing  #    #  L5-S1: Grade 1 anterolisthesis. Facet arthropathy and disc bulge causes mild central canal stenosis and mild bilateral foraminal narrowing.    IMPRESSION:   1.  L4-5 severe degenerative disc disease with grade 1 anterolisthesis and laminectomy decompression. There is mild residual central canal lateral recess stenosis.   2.  L5-S1 minimal grade 1 anterolisthesis contributes to mild central canal stenosis   Electronically Signed by: Abner Greenspan on 06/10/2021 1:58 PM     Objective:  VS:  HT:    WT:   BMI:     BP:134/74  HR:(!) 59bpm  TEMP: ( )  RESP:  Physical Exam Vitals and nursing note reviewed.  Constitutional:      General: He is  not in acute distress.    Appearance: Normal appearance. He is not ill-appearing.  HENT:     Head: Normocephalic and atraumatic.     Right Ear: External ear normal.     Left Ear: External ear normal.     Nose: No congestion.  Eyes:     Extraocular Movements: Extraocular movements intact.  Cardiovascular:     Rate and Rhythm: Normal rate.     Pulses: Normal pulses.  Pulmonary:     Effort: Pulmonary effort is normal. No respiratory distress.  Abdominal:     General: There is no distension.     Palpations: Abdomen is soft.  Musculoskeletal:        General: No tenderness or signs of injury.     Cervical back: Neck supple.     Right lower leg: No edema.     Left lower leg: No edema.     Comments: Patient has good distal strength without clonus.  Skin:    Findings: No erythema or rash.  Neurological:     General: No focal deficit present.     Mental Status: He is alert and oriented to person, place, and time.     Sensory:  No sensory deficit.     Motor: No weakness or abnormal muscle tone.     Coordination: Coordination normal.     Gait: Gait abnormal.  Psychiatric:        Mood and Affect: Mood normal.        Behavior: Behavior normal.      Imaging: No results found.

## 2022-01-17 NOTE — Progress Notes (Signed)
Daniel [name] - 88 y.o. male MRN [id number]  Date of birth: [birth year]  Office Visit Note: Visit Date: 11/06/2021 PCP: Geoffry Paradise, MD Referred by: Juanda Chance, NP  Subjective: Chief Complaint  Patient presents with   Left Knee - Numbness   Left Foot - Numbness   HPI:  Daniel [name] is a 88 y.o. male who comes in today at the request of Ellin Goodie, FNP for electrodiagnostic study of the Left lower extremity.  Please see our office notes for further details and justification.  Patient had prior electrodiagnostic study for similar problem in 2018 although now he reports that this has been increased about a week or so after radiofrequency ablation procedure.  Symptoms again from the knee down.  Not really from the hip or leg down.  Does not feel much in the way of weakness or pain just paresthesia.  Electrodiagnostic study in 2018 was by Dr. Nita Sickle and is reviewed below but basically shows a sensorimotor polyneuropathy without focal nerve entrapment and perhaps superimposed S1 radiculopathy.   ROS Otherwise per HPI.  Assessment & Plan: Visit Diagnoses:    ICD-10-CM   1. Paresthesia of skin  R20.2     2. Weakness  R53.1       Plan: Findings:  Interestingly and clinically he has had similar symptoms in the past and prior electrodiagnostic study but seemingly a week after radiofrequency ablation he began having more symptoms from the knee down.  Symptoms seem to be in a distribution potentially of the peroneal or fibular nerve but no real loss of velocity across the fibular head.  The electrodiagnostic study is somewhat difficult Daniel [name] to muscle atrophy and technical artifact.  There does not seem to be any fibular focal neuropathy but a generalized polyneuropathy.  There is no muscle denervation on needle EMG and he really does not have weakness.  We will continue monitoring him and we will follow-up with him in general for his back in the future.  Radiofrequency ablation  does not seem to help his back pain that much.    Meds & Orders: No orders of the defined types were placed in this encounter.  No orders of the defined types were placed in this encounter.   Follow-up: Return in about 2 weeks (around 11/20/2021), or if symptoms worsen or fail to improve.   Procedures: EMG & NCV Findings: Evaluation of the left Dp Br Fibular motor nerve showed prolonged distal onset latency (Poplit, 6.5 ms) and decreased conduction velocity (Poplit-Fib Head, 35 m/s).  The left fibular motor nerve showed prolonged distal onset latency (10.4 ms), reduced amplitude (0.2 mV), decreased conduction velocity (B Fib-Ankle, 27 m/s), and decreased conduction velocity (Poplt-B Fib, 26 m/s).  The left tibial motor nerve showed prolonged distal onset latency (6.4 ms), reduced amplitude (1.0 mV), and decreased conduction velocity (Knee-Ankle, 34 m/s).  The left superficial fibular sensory nerve showed prolonged distal peak latency (7.1 ms), reduced amplitude (3.9 V), and decreased conduction velocity (14 cm-Ant Lat Mall, 20 m/s).  The left sural sensory nerve showed prolonged distal peak latency (5.0 ms) and decreased conduction velocity (Calf-Lat Mall, 28 m/s).  All remaining nerves (as indicated in the following tables) were within normal limits.    All examined muscles (as indicated in the following table) showed no evidence of electrical instability.    Impression: The above electrodiagnostic study  is ABNORMAL reveals evidence of a peripheral polyneuropathy of the left lower extremity.   There is  no significant electrodiagnostic evidence of any other focal nerve entrapment, lumbosacral plexopathy or lumbar radiculopathy in the left lower limb.    ___________________________ Daniel [name] Board Certified, American Board of Physical Medicine and Rehabilitation    Nerve Conduction Studies Anti Sensory Summary Table   Stim Site NR Peak (ms) Norm Peak (ms) P-T Amp (V) Norm P-T  Amp Site1 Site2 Delta-P (ms) Dist (cm) Vel (m/s) Norm Vel (m/s)  Left Saphenous Anti Sensory (Ant Med Mall)  28.1C  14cm    4.4 <4.4 10.8 >2 14cm Ant Med Mall 4.4 0.0  >32  Left Sup Fibular Anti Sensory (Ant Lat Mall)  28.2C  14 cm    *7.1 <4.4 *3.9 >5.0 14 cm Ant Lat Mall 7.1 14.0 *20 >32  Left Sural Anti Sensory (Lat Mall)  28C  Calf    *5.0 <4.0 6.6 >5.0 Calf Lat Mall 5.0 14.0 *28 >35   Motor Summary Table   Stim Site NR Onset (ms) Norm Onset (ms) O-P Amp (mV) Norm O-P Amp Site1 Site2 Delta-0 (ms) Dist (cm) Vel (m/s) Norm Vel (m/s)  Left Dp Br Fibular Motor (AntTibialis)  28.1C  Fib Head    3.4 <4.2 1.3  Poplit Fib Head 3.1 11.0 *35 >40.5  Poplit    *6.5 <6.0 0.9         Left Fibular Motor (Ext Dig Brev)  28.1C  Ankle    *10.4 <6.1 *0.2 >2.5 B Fib Ankle 13.7 37.0 *27 >38  B Fib    24.1  0.2  Poplt B Fib 4.2 11.0 *26 >40  Poplt    28.3  0.2         Left Tibial Motor (Abd Hall Brev)  28.2C  Ankle    *6.4 <6.1 *1.0 >3.0 Knee Ankle 13.1 45.0 *34 >35  Knee    19.5  0.5          EMG   Side Muscle Nerve Root Ins Act Fibs Psw Amp Dur Poly Recrt Int Dennie Bible Comment  Left AntTibialis Dp Br Peron L4-5 Nml Nml Nml Nml Nml 0 Nml Nml   Left Fibularis Longus  Sup Br Peron L5-S1 Nml Nml Nml Nml Nml 0 Nml Nml   Left MedGastroc Tibial S1-2 Nml Nml Nml Nml Nml 0 Nml Nml   Left VastusMed Femoral L2-4 Nml Nml Nml Nml Nml 0 Nml Nml   Left BicepsFemS Sciatic L5-S1 Nml Nml Nml Nml Nml 0 Nml Nml     Nerve Conduction Studies Anti Sensory Left/Right Comparison   Stim Site L Lat (ms) R Lat (ms) L-R Lat (ms) L Amp (V) R Amp (V) L-R Amp (%) Site1 Site2 L Vel (m/s) R Vel (m/s) L-R Vel (m/s)  Saphenous Anti Sensory (Ant Med Mall)  28.1C  14cm 4.4   10.8   14cm Ant Med Mall     Sup Fibular Anti Sensory (Ant Lat Mall)  28.2C  14 cm *7.1   *3.9   14 cm Ant Lat Mall *20    Sural Anti Sensory (Lat Mall)  28C  Calf *5.0   6.6   Calf Lat Mall *28     Motor Left/Right Comparison   Stim Site L Lat (ms)  R Lat (ms) L-R Lat (ms) L Amp (mV) R Amp (mV) L-R Amp (%) Site1 Site2 L Vel (m/s) R Vel (m/s) L-R Vel (m/s)  Dp Br Fibular Motor (AntTibialis)  28.1C  Fib Head 3.4   1.3   Poplit Fib Head *35  Poplit *6.5   0.9         Fibular Motor (Ext Dig Brev)  28.1C  Ankle *10.4   *0.2   B Fib Ankle *27    B Fib 24.1   0.2   Poplt B Fib *26    Poplt 28.3   0.2         Tibial Motor (Abd Hall Brev)  28.2C  Ankle *6.4   *1.0   Knee Ankle *34    Knee 19.5   0.5            Waveforms:                Clinical History: 05/19/2017 NCV & EMG Findings: Extensive electrodiagnostic testing of the left upper and lower extremity shows:  1. Right median, radial, and ulnar sensory responses are within normal limits. 2. Right median and ulnar motor responses are within normal limits. 3. Right sural and superficial peroneal sensory responses are absent. 4. Right tibial motor response shows reduced amplitude and slowed conduction velocity. The peroneal motor response is absent at the extensor digitorum brevis, and normal at the tibialis anterior. 5. Right tibial H reflex study shows prolonged latency. 6. There is no evidence of active or chronic motor axon loss changes affecting any of the tested muscles in the left upper extremity. 7. Chronic motor axon loss changes are seen affecting the left gastrocnemius and biceps femoris short head muscles, without accompanied active denervation.   Impression: 1. 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  A superimposed S1 radiculopathy affecting the left lower extremity is also likely. 2. There is no evidence of a sensorimotor neuropathy or cervical radiculopathy affecting the left upper extremity.     ___________________________ Narda Amber, DO -------------------------------- MRI lumbar spine:   TECHNIQUE: Sagittal and axial T1 and T2-weighted sequences were  performed. Additional sagittal STIR images were performed.   INDICATION: Back pain   COMPARISON: None   FINDINGS:  #  5 mm grade 1 anterolisthesis L4-5 with minimal grade 1 anterolisthesis is L5-S1.  #  Vertebral body heights are well maintained.  #  Trace type stress I Modic endplate change [id number] and type II at L4-5  #  Conus terminates at T12-L1 without evidence of tethering.  #  Nerve roots appear normal.  #  Incidental findings: None.    #  L1-2: Mild degenerative disc disease. Canal is well-maintained. Neuroforamina patent.  #    #  L2-3: Mild degenerative disc disease. Central canal is well-maintained. Neural foramina are patent.  #    #  L3-4: Mild degenerative disc disease. There is facet and disc bulge causing mild central canal stenosis with mild bilateral foraminal narrowing.  #    #  L4-5: Severe degenerative disc disease. Grade 1 anterolisthesis and laminectomy decompression with facet arthropathy and disc uncovering causing mild central canal and lateral recess stenosis and moderate bilateral foraminal narrowing  #    #  L5-S1: Grade 1 anterolisthesis. Facet arthropathy and disc bulge causes mild central canal stenosis and mild bilateral foraminal narrowing.    IMPRESSION:   1.  L4-5 severe degenerative disc disease with grade 1 anterolisthesis and laminectomy decompression. There is mild residual central canal lateral recess stenosis.   2.  L5-S1 minimal grade 1 anterolisthesis contributes to mild central canal stenosis   Electronically Signed by: Ritta Slot on 06/10/2021 1:58 PM     Objective:  VS:  HT:    WT:   BMI:     BP:   HR: bpm  TEMP: ( )  RESP:  Physical Exam Vitals and nursing note reviewed.  Constitutional:      General: He is not in acute distress.    Appearance: Normal appearance. He is not ill-appearing.  HENT:     Head: Normocephalic and atraumatic.     Right Ear: External ear normal.     Left Ear: External ear normal.     Nose: No  congestion.  Eyes:     Extraocular Movements: Extraocular movements intact.  Cardiovascular:     Rate and Rhythm: Normal rate.     Pulses: Normal pulses.  Pulmonary:     Effort: Pulmonary effort is normal. No respiratory distress.  Abdominal:     General: There is no distension.     Palpations: Abdomen is soft.  Musculoskeletal:        General: No tenderness or signs of injury.     Cervical back: Neck supple.     Right lower leg: Edema present.     Left lower leg: Edema present.     Comments: Patient has good distal strength without clonus.  He has a negative Tinel's over the fibular head bilaterally.  He seems to have fairly normal bulk and muscle tone in both calves and anterior compartment of both legs.  He is intrinsic foot muscular atrophy with mild edema.  He has impaired dysesthesias in the left lower extremity in a somewhat nondermatomal fashion.  Skin:    Findings: No erythema or rash.  Neurological:     General: No focal deficit present.     Mental Status: He is alert and oriented to person, place, and time.     Sensory: Sensory deficit present.     Motor: No weakness or abnormal muscle tone.     Coordination: Coordination normal.     Gait: Gait abnormal.  Psychiatric:        Mood and Affect: Mood normal.        Behavior: Behavior normal.      Imaging: No results found.

## 2022-04-06 ENCOUNTER — Other Ambulatory Visit: Payer: Self-pay | Admitting: Cardiology

## 2022-04-24 ENCOUNTER — Inpatient Hospital Stay (HOSPITAL_COMMUNITY)
Admission: EM | Admit: 2022-04-24 | Discharge: 2022-04-27 | DRG: 322 | Disposition: A | Discharge status: Home / Self Care | Payer: Medicare Other | Attending: Cardiology | Admitting: Cardiology

## 2022-04-24 ENCOUNTER — Emergency Department (HOSPITAL_COMMUNITY): Payer: Medicare Other

## 2022-04-24 ENCOUNTER — Other Ambulatory Visit: Payer: Self-pay

## 2022-04-24 DIAGNOSIS — I10 Essential (primary) hypertension: Secondary | ICD-10-CM | POA: Diagnosis present

## 2022-04-24 DIAGNOSIS — Z955 Presence of coronary angioplasty implant and graft: Secondary | ICD-10-CM

## 2022-04-24 DIAGNOSIS — Z981 Arthrodesis status: Secondary | ICD-10-CM

## 2022-04-24 DIAGNOSIS — I200 Unstable angina: Secondary | ICD-10-CM | POA: Diagnosis present

## 2022-04-24 DIAGNOSIS — Z951 Presence of aortocoronary bypass graft: Secondary | ICD-10-CM

## 2022-04-24 DIAGNOSIS — Z888 Allergy status to other drugs, medicaments and biological substances status: Secondary | ICD-10-CM

## 2022-04-24 DIAGNOSIS — E785 Hyperlipidemia, unspecified: Secondary | ICD-10-CM | POA: Diagnosis present

## 2022-04-24 DIAGNOSIS — Z881 Allergy status to other antibiotic agents status: Secondary | ICD-10-CM

## 2022-04-24 DIAGNOSIS — Z79899 Other long term (current) drug therapy: Secondary | ICD-10-CM

## 2022-04-24 DIAGNOSIS — I25110 Atherosclerotic heart disease of native coronary artery with unstable angina pectoris: Principal | ICD-10-CM | POA: Diagnosis present

## 2022-04-24 DIAGNOSIS — Z96651 Presence of right artificial knee joint: Secondary | ICD-10-CM | POA: Diagnosis present

## 2022-04-24 DIAGNOSIS — Z880 Allergy status to penicillin: Secondary | ICD-10-CM

## 2022-04-24 DIAGNOSIS — I2089 Other forms of angina pectoris: Principal | ICD-10-CM

## 2022-04-24 DIAGNOSIS — Z7951 Long term (current) use of inhaled steroids: Secondary | ICD-10-CM

## 2022-04-24 DIAGNOSIS — Z7902 Long term (current) use of antithrombotics/antiplatelets: Secondary | ICD-10-CM

## 2022-04-24 DIAGNOSIS — M179 Osteoarthritis of knee, unspecified: Secondary | ICD-10-CM | POA: Diagnosis present

## 2022-04-24 DIAGNOSIS — Z882 Allergy status to sulfonamides status: Secondary | ICD-10-CM

## 2022-04-24 DIAGNOSIS — I25700 Atherosclerosis of coronary artery bypass graft(s), unspecified, with unstable angina pectoris: Secondary | ICD-10-CM | POA: Diagnosis present

## 2022-04-24 DIAGNOSIS — Z8249 Family history of ischemic heart disease and other diseases of the circulatory system: Secondary | ICD-10-CM

## 2022-04-24 LAB — BASIC METABOLIC PANEL
Anion gap: 9 (ref 5–15)
BUN: 33 mg/dL — ABNORMAL HIGH (ref 8–23)
CO2: 26 mmol/L (ref 22–32)
Calcium: 9.4 mg/dL (ref 8.9–10.3)
Chloride: 104 mmol/L (ref 98–111)
Creatinine, Ser: 1.17 mg/dL (ref 0.61–1.24)
GFR, Estimated: >60 mL/min (ref >60)
Glucose, Bld: 141 mg/dL — ABNORMAL HIGH (ref 70–99)
Potassium: 4.1 mmol/L (ref 3.5–5.1)
Sodium: 139 mmol/L (ref 135–145)

## 2022-04-24 LAB — CBC
HCT: 38.7 % — ABNORMAL LOW (ref 39.0–52.0)
Hemoglobin: 12.7 g/dL — ABNORMAL LOW (ref 13.0–17.0)
MCH: 30.4 pg (ref 26.0–34.0)
MCHC: 32.8 g/dL (ref 30.0–36.0)
MCV: 92.6 fL (ref 80.0–100.0)
Platelets: 235 10*3/uL (ref 150–400)
RBC: 4.18 MIL/uL — ABNORMAL LOW (ref 4.22–5.81)
RDW: 13.2 % (ref 11.5–15.5)
WBC: 5.3 10*3/uL (ref 4.0–10.5)
nRBC: 0 % (ref 0.0–0.2)

## 2022-04-24 LAB — TROPONIN I (HIGH SENSITIVITY): Troponin I (High Sensitivity): 5 ng/L (ref <18)

## 2022-04-24 NOTE — ED Provider Triage Note (Signed)
Emergency Medicine Provider Triage Evaluation Note  Daniel [name] , a 88 y.o. male  was evaluated in triage.  Pt complains of chest pain. States symptoms have been actually going on for about 2 weeks. Having CP on exertion that is relieved with rest. States tonight he was walking to the mailbox when he had left sided chest pain. States he went inside and sat down and symptoms went away. States after dinner he had onset of chest tightness again that was severe. States he took 1 ntg which improved his symptoms and felt he needed to be evaluated. Hx of CABG x3, stent x1. Sees Dr. Herbie Baltimore with [facility].  Review of Systems  Positive: See above Negative:   Physical Exam  BP (!) 177/79 (BP Location: Right Arm)   Pulse 70   Temp 98.3 F (36.8 C)   Resp 16   Ht 6\' 3"  (1.905 m)   Wt 86.2 kg   SpO2 100%   BMI 23.75 kg/m  Gen:   Awake, no distress   Resp:  Normal effort  MSK:   Moves extremities without difficulty  Other:  S1/s2 without murmur, no LE swelling   Medical Decision Making  Medically screening exam initiated at 9:51 PM.  Appropriate orders placed.  Marlene Pfluger Jaskot was informed that the remainder of the evaluation will be completed by another provider, this initial triage assessment does not replace that evaluation, and the importance of remaining in the ED until their evaluation is complete.     Genene Churn, PA-C 04/24/22 2153

## 2022-04-24 NOTE — ED Triage Notes (Signed)
Pt reporting about 2 weeks he has had a pain in his chest on a off. Tonight, he had a tightness in his chest that went away, but then returned as a constant pain. He currently denies cp, sob.

## 2022-04-24 NOTE — ED Triage Notes (Signed)
Pt arrives via [facility] from home. Around 1700, he had some exertional chest pain. Once  he sat down for dinner, it went away. Around 1900 the pain returned, initially a 9/10. He took a prescribed nitro which decreased the pain to a 4/10. He was given 1 nitro from EMS, which resolution of his pain. Hx of CABG. 162/92, hr 80, 98% on ra. IV in the left AC. Given ASA en route. On plavix.

## 2022-04-25 ENCOUNTER — Encounter (HOSPITAL_COMMUNITY): Payer: Self-pay

## 2022-04-25 DIAGNOSIS — Z7902 Long term (current) use of antithrombotics/antiplatelets: Secondary | ICD-10-CM | POA: Diagnosis not present

## 2022-04-25 DIAGNOSIS — M179 Osteoarthritis of knee, unspecified: Secondary | ICD-10-CM | POA: Diagnosis present

## 2022-04-25 DIAGNOSIS — Z882 Allergy status to sulfonamides status: Secondary | ICD-10-CM | POA: Diagnosis not present

## 2022-04-25 DIAGNOSIS — Z955 Presence of coronary angioplasty implant and graft: Secondary | ICD-10-CM | POA: Diagnosis not present

## 2022-04-25 DIAGNOSIS — R079 Chest pain, unspecified: Secondary | ICD-10-CM | POA: Diagnosis not present

## 2022-04-25 DIAGNOSIS — E7800 Pure hypercholesterolemia, unspecified: Secondary | ICD-10-CM | POA: Diagnosis not present

## 2022-04-25 DIAGNOSIS — Z8249 Family history of ischemic heart disease and other diseases of the circulatory system: Secondary | ICD-10-CM | POA: Diagnosis not present

## 2022-04-25 DIAGNOSIS — I2510 Atherosclerotic heart disease of native coronary artery without angina pectoris: Secondary | ICD-10-CM

## 2022-04-25 DIAGNOSIS — Z7951 Long term (current) use of inhaled steroids: Secondary | ICD-10-CM | POA: Diagnosis not present

## 2022-04-25 DIAGNOSIS — I10 Essential (primary) hypertension: Secondary | ICD-10-CM | POA: Diagnosis present

## 2022-04-25 DIAGNOSIS — E785 Hyperlipidemia, unspecified: Secondary | ICD-10-CM | POA: Diagnosis present

## 2022-04-25 DIAGNOSIS — I2583 Coronary atherosclerosis due to lipid rich plaque: Secondary | ICD-10-CM

## 2022-04-25 DIAGNOSIS — Z79899 Other long term (current) drug therapy: Secondary | ICD-10-CM | POA: Diagnosis not present

## 2022-04-25 DIAGNOSIS — Z888 Allergy status to other drugs, medicaments and biological substances status: Secondary | ICD-10-CM | POA: Diagnosis not present

## 2022-04-25 DIAGNOSIS — I25710 Atherosclerosis of autologous vein coronary artery bypass graft(s) with unstable angina pectoris: Secondary | ICD-10-CM | POA: Diagnosis not present

## 2022-04-25 DIAGNOSIS — Z951 Presence of aortocoronary bypass graft: Secondary | ICD-10-CM | POA: Diagnosis not present

## 2022-04-25 DIAGNOSIS — Z881 Allergy status to other antibiotic agents status: Secondary | ICD-10-CM | POA: Diagnosis not present

## 2022-04-25 DIAGNOSIS — I200 Unstable angina: Secondary | ICD-10-CM | POA: Diagnosis present

## 2022-04-25 DIAGNOSIS — Z96651 Presence of right artificial knee joint: Secondary | ICD-10-CM | POA: Diagnosis present

## 2022-04-25 DIAGNOSIS — Z981 Arthrodesis status: Secondary | ICD-10-CM | POA: Diagnosis not present

## 2022-04-25 DIAGNOSIS — I25110 Atherosclerotic heart disease of native coronary artery with unstable angina pectoris: Secondary | ICD-10-CM | POA: Diagnosis present

## 2022-04-25 DIAGNOSIS — Z880 Allergy status to penicillin: Secondary | ICD-10-CM | POA: Diagnosis not present

## 2022-04-25 DIAGNOSIS — I25700 Atherosclerosis of coronary artery bypass graft(s), unspecified, with unstable angina pectoris: Secondary | ICD-10-CM | POA: Diagnosis present

## 2022-04-25 LAB — TROPONIN I (HIGH SENSITIVITY): Troponin I (High Sensitivity): 5 ng/L (ref <18)

## 2022-04-25 LAB — HEPARIN LEVEL (UNFRACTIONATED): Heparin Unfractionated: 0.55 IU/mL (ref 0.30–0.70)

## 2022-04-25 LAB — PROTIME-INR
INR: 1 (ref 0.8–1.2)
Prothrombin Time: 13.3 seconds (ref 11.4–15.2)

## 2022-04-25 MED ORDER — MONTELUKAST SODIUM 10 MG PO TABS
10.0000 mg | ORAL_TABLET | Freq: Every day | ORAL | Status: DC
  Administered 2022-04-25 – 2022-04-26 (×2): 10 mg via ORAL
  Filled 2022-04-25 (×2): qty 1

## 2022-04-25 MED ORDER — HEPARIN BOLUS VIA INFUSION
4000.0000 [IU] | Freq: Once | INTRAVENOUS | Status: AC
  Administered 2022-04-25: 4000 [IU] via INTRAVENOUS
  Filled 2022-04-25: qty 4000

## 2022-04-25 MED ORDER — TRIAMCINOLONE ACETONIDE 55 MCG/ACT NA AERO: 1.0000 | INHALATION_SPRAY | Freq: Every day | NASAL | Status: DC

## 2022-04-25 MED ORDER — SODIUM CHLORIDE 0.9 % IV SOLN: 250.0000 mL | INTRAVENOUS | Status: DC | PRN

## 2022-04-25 MED ORDER — CLOPIDOGREL BISULFATE 75 MG PO TABS
75.0000 mg | ORAL_TABLET | Freq: Every day | ORAL | Status: DC
  Administered 2022-04-25 – 2022-04-27 (×3): 75 mg via ORAL
  Filled 2022-04-25 (×3): qty 1

## 2022-04-25 MED ORDER — TRAMADOL HCL 50 MG PO TABS
50.0000 mg | ORAL_TABLET | Freq: Three times a day (TID) | ORAL | Status: DC | PRN
  Administered 2022-04-25 (×2): 50 mg via ORAL
  Administered 2022-04-26 – 2022-04-27 (×3): 100 mg via ORAL
  Filled 2022-04-25: qty 1
  Filled 2022-04-25: qty 2
  Filled 2022-04-25 (×2): qty 1
  Filled 2022-04-25: qty 2
  Filled 2022-04-25: qty 1

## 2022-04-25 MED ORDER — ASPIRIN 81 MG PO TBEC
81.0000 mg | DELAYED_RELEASE_TABLET | Freq: Every day | ORAL | Status: DC
  Administered 2022-04-25 – 2022-04-27 (×2): 81 mg via ORAL
  Filled 2022-04-25 (×3): qty 1

## 2022-04-25 MED ORDER — ACETAMINOPHEN 325 MG PO TABS
650.0000 mg | ORAL_TABLET | ORAL | Status: DC | PRN
  Administered 2022-04-26 (×2): 650 mg via ORAL
  Filled 2022-04-25 (×2): qty 2

## 2022-04-25 MED ORDER — AZELASTINE HCL 0.1 % NA SOLN
1.0000 | Freq: Two times a day (BID) | NASAL | Status: DC
  Filled 2022-04-25: qty 30

## 2022-04-25 MED ORDER — SODIUM CHLORIDE 0.9 % WEIGHT BASED INFUSION
3.0000 mL/kg/h | INTRAVENOUS | Status: DC
  Administered 2022-04-26: 3 mL/kg/h via INTRAVENOUS

## 2022-04-25 MED ORDER — ONDANSETRON HCL 4 MG/2ML IJ SOLN: 4.0000 mg | Freq: Four times a day (QID) | INTRAMUSCULAR | Status: DC | PRN

## 2022-04-25 MED ORDER — NITROGLYCERIN 0.4 MG SL SUBL: 0.4000 mg | SUBLINGUAL_TABLET | SUBLINGUAL | Status: DC | PRN

## 2022-04-25 MED ORDER — NITROGLYCERIN 0.4 MG SL SUBL
0.4000 mg | SUBLINGUAL_TABLET | SUBLINGUAL | Status: DC | PRN
  Administered 2022-04-25: 0.4 mg via SUBLINGUAL
  Filled 2022-04-25: qty 1

## 2022-04-25 MED ORDER — ASPIRIN 81 MG PO TBEC: 81.0000 mg | DELAYED_RELEASE_TABLET | Freq: Every day | ORAL | Status: DC

## 2022-04-25 MED ORDER — NITROGLYCERIN IN D5W 200-5 MCG/ML-% IV SOLN
2.0000 ug/min | INTRAVENOUS | Status: DC
  Administered 2022-04-25: 5 ug/min via INTRAVENOUS
  Administered 2022-04-26: 40 ug/min via INTRAVENOUS
  Filled 2022-04-25 (×2): qty 250

## 2022-04-25 MED ORDER — ZOLPIDEM TARTRATE 5 MG PO TABS
10.0000 mg | ORAL_TABLET | Freq: Every evening | ORAL | Status: DC | PRN
  Administered 2022-04-26 (×2): 10 mg via ORAL
  Filled 2022-04-25 (×2): qty 2

## 2022-04-25 MED ORDER — HEPARIN (PORCINE) 25000 UT/250ML-% IV SOLN
1200.0000 [IU]/h | INTRAVENOUS | Status: DC
  Administered 2022-04-25: 1200 [IU]/h via INTRAVENOUS
  Filled 2022-04-25 (×2): qty 250

## 2022-04-25 MED ORDER — BUDESONIDE 0.25 MG/2ML IN SUSP
0.2500 mg | Freq: Two times a day (BID) | RESPIRATORY_TRACT | Status: DC
  Filled 2022-04-25 (×4): qty 2

## 2022-04-25 MED ORDER — ASPIRIN 81 MG PO CHEW
81.0000 mg | CHEWABLE_TABLET | ORAL | Status: AC
  Administered 2022-04-26: 81 mg via ORAL
  Filled 2022-04-25: qty 1

## 2022-04-25 MED ORDER — SODIUM CHLORIDE 0.9 % WEIGHT BASED INFUSION
1.0000 mL/kg/h | INTRAVENOUS | Status: DC
  Administered 2022-04-26: 1 mL/kg/h via INTRAVENOUS

## 2022-04-25 MED ORDER — METOPROLOL SUCCINATE ER 50 MG PO TB24
50.0000 mg | ORAL_TABLET | Freq: Two times a day (BID) | ORAL | Status: DC
  Administered 2022-04-25 – 2022-04-26 (×3): 50 mg via ORAL
  Filled 2022-04-25: qty 1
  Filled 2022-04-25 (×3): qty 2

## 2022-04-25 MED ORDER — IRBESARTAN 150 MG PO TABS
150.0000 mg | ORAL_TABLET | Freq: Every day | ORAL | Status: DC
  Administered 2022-04-25 – 2022-04-26 (×2): 150 mg via ORAL
  Filled 2022-04-25 (×2): qty 1

## 2022-04-25 MED ORDER — SODIUM CHLORIDE 0.9% FLUSH
3.0000 mL | Freq: Two times a day (BID) | INTRAVENOUS | Status: DC
  Administered 2022-04-25 – 2022-04-27 (×3): 3 mL via INTRAVENOUS

## 2022-04-25 MED ORDER — SODIUM CHLORIDE 0.9% FLUSH: 3.0000 mL | INTRAVENOUS | Status: DC | PRN

## 2022-04-25 MED ORDER — TAMSULOSIN HCL 0.4 MG PO CAPS
0.4000 mg | ORAL_CAPSULE | Freq: Every day | ORAL | Status: DC
  Administered 2022-04-25 – 2022-04-26 (×2): 0.4 mg via ORAL
  Filled 2022-04-25 (×2): qty 1

## 2022-04-25 NOTE — Progress Notes (Signed)
ANTICOAGULATION CONSULT NOTE  Pharmacy Consult for Heparin Indication: chest pain/ACS Brief A/P: Heparin level within goal range Continue Heparin at current rate   Allergies  Allergen Reactions   Penicillins Swelling and Other (See Comments)    Swelling and redness in joints  Has patient had a PCN reaction causing immediate rash, facial/tongue/throat swelling, SOB or lightheadedness with hypotension: No Has patient had a PCN reaction causing severe rash involving mucus membranes or skin necrosis: No Has patient had a PCN reaction that required hospitalization: No Has patient had a PCN reaction occurring within the last 10 years: No If all of the above answers are "NO", then may proceed with Cephalosporin use.    Sulfa Antibiotics Other (See Comments)    Pain in left side underneath ribcage-pancreas   Statins Other (See Comments)    UNSPECIFIED REACTION  "INTOLERANCE AT HIGH DOSES"    Patient Measurements: Height: 6\' 3"  (190.5 cm) Weight: 86.2 kg (190 lb) IBW/kg (Calculated) : 84.5 Heparin Dosing Weight: 86.2 kg   Vital Signs: Temp: 98.8 F (37.1 C) (11/19 2005) Temp Source: Oral (11/19 1458) BP: 149/87 (11/19 2215) Pulse Rate: 92 (11/19 2215)  Labs: Recent Labs    04/24/22 2146 04/24/22 2349 04/25/22 1257 04/25/22 2217  HGB 12.7*  --   --   --   HCT 38.7*  --   --   --   PLT 235  --   --   --   LABPROT  --   --  13.3  --   INR  --   --  1.0  --   HEPARINUNFRC  --   --   --  0.55  CREATININE 1.17  --   --   --   TROPONINIHS 5 5  --   --      Estimated Creatinine Clearance: 53.2 mL/min (by C-G formula based on SCr of 1.17 mg/dL).  Assessment: 88 y.o. [name] with chest pain for heparin   Goal of Therapy:  Heparin level 0.3-0.7 units/ml Monitor platelets by anticoagulation protocol: Yes   Plan:  Continue Heparin at current rate   97, PharmD, BCPS

## 2022-04-25 NOTE — Progress Notes (Signed)
ANTICOAGULATION CONSULT NOTE - Initial Consult  Pharmacy Consult for Heparin Indication: chest pain/ACS  Allergies  Allergen Reactions   Penicillins Swelling and Other (See Comments)    Swelling and redness in joints  Has patient had a PCN reaction causing immediate rash, facial/tongue/throat swelling, SOB or lightheadedness with hypotension: No Has patient had a PCN reaction causing severe rash involving mucus membranes or skin necrosis: No Has patient had a PCN reaction that required hospitalization: No Has patient had a PCN reaction occurring within the last 10 years: No If all of the above answers are "NO", then may proceed with Cephalosporin use.    Sulfa Antibiotics Other (See Comments)    Pain in left side underneath ribcage-pancreas   Statins Other (See Comments)    UNSPECIFIED REACTION  "INTOLERANCE AT HIGH DOSES"    Patient Measurements: Height: 6\' 3"  (190.5 cm) Weight: 86.2 kg (190 lb) IBW/kg (Calculated) : 84.5 Heparin Dosing Weight: 86.2 kg   Vital Signs: Temp: 98.4 F (36.9 C) (11/19 1108) Temp Source: Oral (11/19 1108) BP: 199/85 (11/19 1115) Pulse Rate: 73 (11/19 1115)  Labs: Recent Labs    04/24/22 2146 04/24/22 2349  HGB 12.7*  --   HCT 38.7*  --   PLT 235  --   CREATININE 1.17  --   TROPONINIHS 5 5    Estimated Creatinine Clearance: 53.2 mL/min (by C-G formula based on SCr of 1.17 mg/dL).   Medical History: Past Medical History:  Diagnosis Date   Arthritis    CAD of autologous arterial graft 2010   Atretic LIMA; Myoview Jan 2015: Possible mild basal anteroseptal defect thought to be artifact (CRO early LAD ischemia due to inadeuate retrograde perfusion via SVG-Diag   CAD S/P percutaneous coronary angioplasty 11/2006   a. 6/'08: PCI nongrafted distal RCA-PDA: Mini vision BMS 2.25 mm x 28 mm; b. Relook Cath 08/2015: Stable CAD. No culprit lesion. Widely patent PDA stent. Patent SVG-OM 2, SVG-D2. Essentially atretic/small LIMA-LAD, but the LAD  is perfused via SVG-D2   Complication of anesthesia    Coronary artery disease involving left main coronary artery 09/2015   Last cath 2010: 70-80 % ostial left main, 80-90% LAD, subtotal CTO LCx-OM2, RCA patent w/ patent BMS stent (dRCA-PDA); SVG-diagonal backfills LAD, atretic LIMA-LAD -- medical therapy; stable by relook cath in March 2017   Difficult intubation    VERY NARROW AIRWAY PER ANESTHESIOLOGIST (1997 CABG AT [facility])   Dyslipidemia, goal LDL below 70    Dysrhythmia    Heart palpitations    Never fully diagnosed.   History of kidney stones    Hypertension    Insomnia    Mass in chest    JUST WATCHING , WILL CHECK OUT AFTER SURGERY   Pneumonia    S/P CABG x 3 1997   LIMA-LAD, SVG-D2, SVG-OM --> LIMA known to be atretic, but SVG-D2 fills diagonal and LAD   Stenosis of cervical spine with myelopathy (HCC)    Tingling    in fingers    Medications:  (Not in a hospital admission)  Scheduled:  Infusions:  PRN: nitroGLYCERIN  Assessment: 89 yom with a history of CAD s/p CABG and stenting, HTN, heart palpitations. Patient is presenting with chest pain. Heparin per pharmacy consult placed for chest pain/ACS.  Patient is not on anticoagulation prior to arrival.  Hgb 12.7; plt 235  Goal of Therapy:  Heparin level 0.3-0.7 units/ml Monitor platelets by anticoagulation protocol: Yes   Plan:  Give IV heparin 4000 units bolus  x 1 Start heparin infusion at 1200 units/hr Check anti-Xa level in 8 hours and daily while on heparin Continue to monitor H&H and platelets  Delmar Landau, PharmD, BCPS 04/25/2022 12:53 PM ED Clinical Pharmacist -  [phone]

## 2022-04-25 NOTE — H&P (Signed)
Cardiology Admission History and Physical   Patient ID: Daniel [name] MRN: [id number]; DOB: 1932-11-23   Admission date: 04/24/2022  PCP:  Geoffry Paradise, MD   [facility] Providers Cardiologist:  Bryan Lemma, MD       Chief Complaint:  Chest Pain  Patient Profile:   Daniel [name] is a 88 y.o. male with past medical history of CAD (s/p CABG in 1997, cath in 2017 showing atretic LIMA with SVG-D2 filling the LAD with retrograde flow with patent SVG-OM and patent stent in PDA), labile HTN, HLD (intolerant to statins and refused alternative options), retinal vein occlusion and palpitations who is being seen 04/25/2022 for the evaluation of chest pain concerning for accelerating angina at the request of Dr. Anitra Lauth.  History of Present Illness:   [name] was examined by Dr. Herbie Baltimore in 10/2021 and reported BP has been labile at home and was recommend to increase Lisinopril to 10 mg in the a.m. with 5 mg in the PM and he was instructed take an additional 5 mg in the evening if needed.  Recently he has been having episodes of chest pain over the past few weeks and shortness of breath.  He tells me that he will be exerting himself and will all of a sudden feel profoundly weak and then developed shortness of breath and has to sit down and then starts getting discomfort in his chest.  Initially his chest pain was just occasional but now it become more frequent and more severe.  Yesterday he was walking to his mailbox and by the time he came back he started having chest pain that felt like a band type feeling across his chest and he had to sit down and rest.  At that time he took a sublingual nitroglycerin but symptoms persisted and he called 911.  He was given another sublingual nitroglycerin with improvement in his pain which eventually resolved.  ER he then got up to go to the bathroom and again became weak and shortness of breath with chest discomfort and had to lay back down.   Cardiology is now asked to consult.  Says his symptoms are similar to what he had prior to his bypass and his PCI.  He has been actually quite stable except for a trending upwards of his blood pressure over the past couple of months.  Currently he is pain-free.  High-sensitivity troponin was normal and flat at 6>> 5>> 5.  Serum creatinine 1.17, potassium 4.1, hemoglobin 12.7 and platelet count 235.  Cardiology is now asked to admit for further work-up of chest pain.  Past Medical History:  Diagnosis Date   Arthritis    CAD of autologous arterial graft 2010   Atretic LIMA; Myoview Jan 2015: Possible mild basal anteroseptal defect thought to be artifact (CRO early LAD ischemia due to inadeuate retrograde perfusion via SVG-Diag   CAD S/P percutaneous coronary angioplasty 11/2006   a. 6/'08: PCI nongrafted distal RCA-PDA: Mini vision BMS 2.25 mm x 28 mm; b. Relook Cath 08/2015: Stable CAD. No culprit lesion. Widely patent PDA stent. Patent SVG-OM 2, SVG-D2. Essentially atretic/small LIMA-LAD, but the LAD is perfused via SVG-D2   Complication of anesthesia    Coronary artery disease involving left main coronary artery [id number]   Last cath 2010: 70-80 % ostial left main, 80-90% LAD, subtotal CTO LCx-OM2, RCA patent w/ patent BMS stent (dRCA-PDA); SVG-diagonal backfills LAD, atretic LIMA-LAD -- medical therapy; stable by relook cath in March 2017   Difficult  intubation    VERY NARROW AIRWAY PER ANESTHESIOLOGIST (1997 CABG AT [facility])   Dyslipidemia, goal LDL below 70    Dysrhythmia    Heart palpitations    Never fully diagnosed.   History of kidney stones    Hypertension    Insomnia    Mass in chest    JUST WATCHING , WILL CHECK OUT AFTER SURGERY   Pneumonia    S/P CABG x 3 1997   LIMA-LAD, SVG-D2, SVG-OM --> LIMA known to be atretic, but SVG-D2 fills diagonal and LAD   Stenosis of cervical spine with myelopathy (HCC)    Tingling    in fingers    Past Surgical History:  Procedure Laterality  Date   48-HOUR MONITOR  03/2017   Relatively normal.  Very infrequent ectopy.  Short PAT run with occasional PACs/PVCs and bigeminy.  No symptoms noted on monitor.  Longest PAT runs were less than 10 beats.   BACK SURGERY     decompression  lower back    CARDIAC CATHETERIZATION N/A 08/08/2015   Procedure: Left Heart Cath and Cors/Grafts Angiography;  Surgeon: Marykay Lex, MD;  Location: [facility] INVASIVE CV LAB;; -->  LM 80%-70%. Ost LAD 80% then prox-mid 100% (after small D1). Small LCx & OM2 patent with 100% OM2. Prox & distal RCA 20%, patent BMS in distal RCA-rPDA.  Patent/atretic LIMA-dLAD, SVG-D2 (backdills entire dLAD) & SVG-OM2.    COLONOSCOPY     CORONARY ANGIOPLASTY WITH STENT PLACEMENT  Ers[facility]ine Squibb 2008   BMS PCI of distal RCA-RPDA: PCI with a 2.25 mm x 28 mm Mini Vision bare-metal stent.   CORONARY ARTERY BYPASS GRAFT  2/[id number]   LIMA - LAD, SVG- diagonal, SVG-OM   EYE SURGERY     BIL CATARACT   KIDNEY STONE SURGERY     KNEE ARTHROSCOPY     RT  KNEE   LEFT HEART CATH AND CORS/GRAFTS ANGIOGRAPHY  11/2006   High-grade distal RCA and PDA stenosis --> BMS PCI.    LEFT HEART CATH AND CORS/GRAFTS ANGIOGRAPHY  2010   Native coronaries at 70% to 80% ostial left main. LAD had diffuse 80% to 90% stenosis   NM MYOVIEW LTD  02/25/2004; Jan 2015   a) EF 57%; b)  Normal Nuclear Stress Test - No evidence of Ischemia or Infarction. Normal LV Function & wall motion.   NM MYOVIEW LTD  03/17/2017   No significant reversible ischemia. Increased TID ratio 1.34. LVEF 54% with normal wall motion. This is a low risk study.   POSTERIOR CERVICAL FUSION/FORAMINOTOMY N/A 07/14/2017   Procedure: LAMINECTOMY CERVICAL THREE- CERVICAL FOUR WITH LATERAL MASS FUSION;  Surgeon: Lisbeth Renshaw, MD;  Location: [facility] OR;  Service: Neurosurgery;  Laterality: N/A;  LAMINECTOMY CERVICAL 3- CERVICAL 4 WITH LATERAL MASS FUSION   TOTAL KNEE ARTHROPLASTY Right 04/07/2020   Procedure: TOTAL KNEE ARTHROPLASTY;  Surgeon: Dannielle Huh, MD;   Location: [facility] ORS;  Service: Orthopedics;  Laterality: Right;     Medications Prior to Admission: Prior to Admission medications   Medication Sig Start Date End Date Taking? Authorizing Provider  acetaminophen (TYLENOL) 500 MG tablet Take 1,000 mg by mouth every 8 (eight) hours as needed for mild pain.    [provider]  azelastine (ASTELIN) 0.1 % nasal spray Place 1-2 sprays into both nostrils 2 (two) times daily. 09/09/20   Kozlow, Alvira Philips, MD  cholecalciferol (VITAMIN D) 1000 units tablet Take 1,000 Units by mouth daily.    [provider]  clindamycin (CLEOCIN)  300 MG capsule Take 600 mg by mouth 2 (two) times daily. 03/04/21   [provider]  clopidogrel (PLAVIX) 75 MG tablet TAKE 1 TABLET(75 MG) BY MOUTH DAILY 04/06/22   Marykay LexHarding, David W, MD  lisinopril (ZESTRIL) 10 MG tablet Take 10 mg in the morning  and 5 mg( 0.5 tablet) in the evening , may take an additional 0.5 tablet if blood pressure is greater than 180/90 07/17/21   Marykay LexHarding, David W, MD  metoprolol succinate (TOPROL-XL) 50 MG 24 hr tablet TAKE 1 TABLET(50 MG) BY MOUTH IN THE MORNING AND AT BEDTIME 09/28/21   Marykay LexHarding, David W, MD  montelukast (SINGULAIR) 10 MG tablet TAKE 1 TABLET(10 MG) BY MOUTH AT BEDTIME 08/10/21   Kozlow, Alvira PhilipsEric J, MD  Multiple Vitamin (MULTIVITAMIN WITH MINERALS) TABS tablet Take 1 tablet by mouth daily.    [provider]  nitroGLYCERIN (NITROSTAT) 0.4 MG SL tablet PLACE 1 TABLET UNDER THE TONGUE EVERY 5 MINUTES AS NEEDED FOR CHEST PAIN FOR 3 DOSES. IF NOT RELIEF AFTER FIRST DOSE, CALL PRESCRIBER OR 911. 04/16/21   Marykay LexHarding, David W, MD  Polyethylene Glycol 3350 (MIRALAX PO) 1 scoop bid Oral as needed 07/20/17   [provider]  tamsulosin (FLOMAX) 0.4 MG CAPS capsule Take 0.4 mg by mouth at bedtime.  06/08/14   [provider]  traMADol (ULTRAM) 50 MG tablet Take 50-100 mg by mouth 3 (three) times daily as needed. 03/16/21   [provider]  triamcinolone  (NASACORT) 55 MCG/ACT AERO nasal inhaler Place 1 spray into the nose daily. 09/09/20   Kozlow, Alvira PhilipsEric J, MD  vitamin B-12 (CYANOCOBALAMIN) 1000 MCG tablet Take 1,000 mcg by mouth 2 (two) times daily.     [provider]  vitamin C (ASCORBIC ACID) 250 MG tablet Take 250 mg by mouth daily.    [provider]  zolpidem (AMBIEN) 10 MG tablet Take 10 mg by mouth at bedtime as needed for sleep. 03/24/20   [provider]     Allergies:    Allergies  Allergen Reactions   Penicillins Swelling and Other (See Comments)    Swelling and redness in joints  Has patient had a PCN reaction causing immediate rash, facial/tongue/throat swelling, SOB or lightheadedness with hypotension: No Has patient had a PCN reaction causing severe rash involving mucus membranes or skin necrosis: No Has patient had a PCN reaction that required hospitalization: No Has patient had a PCN reaction occurring within the last 10 years: No If all of the above answers are "NO", then may proceed with Cephalosporin use.    Sulfa Antibiotics Other (See Comments)    Pain in left side underneath ribcage-pancreas   Statins Other (See Comments)    UNSPECIFIED REACTION  "INTOLERANCE AT HIGH DOSES"    Social History:   Social History   Socioeconomic History   Marital status: Widowed    Spouse name: Cordelia Penthel   Number of children: 2   Years of education: 16   Highest education level: Not on file  Occupational History   Occupation: retired Psychologist, educationalexecutive  Tobacco Use   Smoking status: Never   Smokeless tobacco: Never  Vaping Use   Vaping Use: Never used  Substance and Sexual Activity   Alcohol use: No   Drug use: No   Sexual activity: Not on file  Other Topics Concern   Not on file  Social History Narrative   He is a widowed father of 2, grandfather 5. He is retired from [employer], Psychologist, educationalexecutive.  His wife Cordelia Pen died in January 2021   He now lives alone in a one story home.   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  He is usually very active, but is limited as far as standing exercise by his knee osteoarthritis.   He does not drink and does not smoke. Never smoked.   Education: college.    Social Determinants of Health   Financial Resource Strain: Not on file  Food Insecurity: Not on file  Transportation Needs: Not on file  Physical Activity: Not on file  Stress: Not on file  Social Connections: Not on file  Intimate Partner Violence: Not on file    Family History:   The patient's family history includes Arthritis in his mother; Heart attack (age of onset: 28) in his father; Heart disease in his brother and mother; Other in his sister; Stroke (age of onset: 24) in his mother. There is no history of Allergic rhinitis, Atopy, Angioedema, Asthma, Eczema, Immunodeficiency, or Urticaria.    ROS:  Please see the history of present illness.  All other ROS reviewed and negative.     Physical Exam/Data:   Vitals:   04/25/22 1100 04/25/22 1108 04/25/22 1115 04/25/22 1245  BP: (!) 171/72  (!) 199/85 (!) 192/80  Pulse: 61  73 77  Resp: Temp:  98.4 F (36.9 C)    TempSrc:  Oral    SpO2: 97%  100% 99%  Weight:      Height:       No intake or output data in the 24 hours ending 04/25/22 1319    04/24/2022    9:35 PM 10/27/2021    3:07 PM 08/02/2021    6:11 PM  Last 3 Weights  Weight (lbs) 190 lb 195 lb 6.4 oz 194 lb  Weight (kg) 86.183 kg 88.633 kg 87.998 kg     Body mass index is 23.75 kg/m.  General:  Well nourished, well developed, in no acute distress HEENT: normal Neck: no JVD Vascular: No carotid bruits; Distal pulses 2+ bilaterally   Cardiac:  normal S1, S2; RRR; no murmur  Lungs:  clear to auscultation bilaterally, no wheezing, rhonchi or rales  Abd: soft, nontender, no hepatomegaly  Ext: no edema Musculoskeletal:  No deformities, BUE and BLE strength normal and equal Skin:  warm and dry  Neuro:  CNs 2-12 intact, no focal abnormalities noted Psych:  Normal affect    EKG:  The ECG that was done today in the ER was personally reviewed and demonstrates sinus rhythm with no ST changes  Relevant CV Studies:  Cardiac Catheterization: 08/2015 Ost LM to LM lesion, 80% stenosed. LM-Ostial LAD lesion, 70-80% stenosed. Prox LAD to Mid LAD lesion, 100% stenosed after small D1. Ost 2nd Mrg lesion, 100% stenosed. Very small caliber AV Groove Circumflex remains after OM1. Prox RCA lesion, 20% stenosed. Dist RCA lesion, 20% stenosed. Widely patent PDA stent SVG-OM2 was injected is large, and is anatomically normal. SVG-D2 was injected is large, and is anatomically normal. Antegrade flow fills a moderate caliber diagonal vessel with minimal disease. Retrograde flow fills the native LAD with TIMI 3 flow. LIMA-LAD was injected is small and diffusely Atretic. It almost does not reach the LAD, and is noted to have significant competetive flow from the Retrograde SVG-Diag flow. The left ventricular systolic function is normal. Systemic Hypertension with Mildly elevated LVEDP  Mr. creasy has stable coronary disease from his last catheterization in 2010. No culprit lesion is found. I suspect that his symptoms could be related to microvascular disease versus diastolic dysfunction. He'll need continued blood pressure management and consider low-dose diuretic in clinic follow-up.   Plan: Standard post radial cath care with TR band removal. He'll be discharged following TR band removal and bed rest. He should've follow-up scheduled with either me or Corine Shelter, PA-C. For now will continue current medications. We'll need to reassess his blood pressure control and volume status as an outpatient.    Laboratory Data:  High Sensitivity Troponin:   Recent Labs  Lab 04/24/22 2146 04/24/22 2349  TROPONINIHS 5 5      Chemistry Recent Labs  Lab 04/24/22 2146  NA 139  K 4.1  CL  104  CO2 26  GLUCOSE 141*  BUN 33*  CREATININE 1.17  CALCIUM 9.4  GFRNONAA >60  ANIONGAP 9    No results for input(s): "PROT", "ALBUMIN", "AST", "ALT", "ALKPHOS", "BILITOT" in the last 168 hours. Lipids No results for input(s): "CHOL", "TRIG", "HDL", "LABVLDL", "LDLCALC", "CHOLHDL" in the last 168 hours. Hematology Recent Labs  Lab 04/24/22 2146  WBC 5.3  RBC 4.18*  HGB 12.7*  HCT 38.7*  MCV 92.6  MCH 30.4  MCHC 32.8  RDW 13.2  PLT 235   Thyroid No results for input(s): "TSH", "FREET4" in the last 168 hours. BNPNo results for input(s): "BNP", "PROBNP" in the last 168 hours.  DDimer No results for input(s): "DDIMER" in the last 168 hours.   Radiology/Studies:  DG Chest 2 View  Result Date: 04/24/2022 CLINICAL DATA:  Intermittent left chest pain for 2 weeks. Worse at night. Prior triple bypass. Prior coronary stenting. EXAM: CHEST - 2 VIEW COMPARISON:  Chest 03/08/2021 FINDINGS: Old CABG. Heart size and vascular pattern are normal. There is calcification of the transverse aorta with normal mediastinal configuration. The lungs are clear with chronic moderate elevation right hemidiaphragm. The sulci are sharp. Osteopenia and mild thoracic kyphosis. Multilevel thoracic spine bridging enthesopathy. No acute spinal compression fracture. IMPRESSION: No active cardiopulmonary disease. Chronic moderate elevation of the right hemidiaphragm. Old CABG. Aortic atherosclerosis. Electronically Signed   By: Almira Bar M.D.   On: 04/24/2022 22:40     Assessment and Plan:   Accelerated/unstable angina -He has been having exertional chest discomfort for the past several weeks and now becoming more frequent and more severe with a quite severe episode yesterday when going to the mailbox.  Symptoms start with shortness of breath with ambulation and profound weakness to the point he has to sit down and then develops chest pain relieved with nitroglycerin. -High-sensitivity troponin negative  x3. -EKG is nonischemic -Suspect he has had progression of his CAD setting of hyperlipidemia using statin therapy or PCSK9 inhibitor although his blood pressure has also been poorly controlled as well so he could be having demand ischemia from poorly controlled blood pressure with transient subendocardial ischemia -He needs aggressive control of hypertension -start IV heparin drip well as IV nitroglycerin drip and titrate for blood pressure control -Continue Plavix 75 mg daily and add aspirin 81 mg daily -Continue Toprol-XL 50 mg twice daily -2D echo to reassess LV function -Commend proceeding with left heart cath and possible PCI in the a.m. -Shared Decision Making/Informed Consent The risks [stroke (1 in 1000), death (1 in 1000), kidney failure [usually temporary] (1 in 500), bleeding (1 in 200), allergic reaction [possibly serious] (1 in 200)], benefits (diagnostic  support and management of coronary artery disease) and alternatives of a cardiac catheterization were discussed in detail with Mr. Kiel and he is willing to proceed.  Hypertension labile hypertension -He has had labile hypertension for some time and currently poorly controlled.  Has had episodes where his systolic blood pressures 200 200 and and other times where he has orthostatic dizziness been told to use amlodipine as needed -This possibly could be playing a role in his exertional chest tightness and shortness of breath -Continue Toprol-XL 50 mg twice daily -Stop lisinopril and change to irbesartan 150 mg daily and titrate as needed for blood pressure control -Titrate nitro drip to get control of blood pressure -Consider addition of amlodipine if needed  Hyperlipidemia -LDL goal less than 70 -I do not have any recent lipids -He is statin intolerant and has refused injections with PCSK9 inhibitor -FLP and ALT in a.m.  ASCAD -He has a history of CABG but unfortunately LIMA became atretic with his SVG to diagonal filling  the LAD via retrograde flow.  His last cath in 2017 showed patent SVG to OM and patent stent in the PDA -Per last note by Dr. Herbie Baltimore in May he had not been having any anginal symptoms -Does not tolerate statin therapy and refused PCSK9 inhibitor o -Continue Plavix 75 mg daily  Risk Assessment/Risk Scores:    TIMI Risk Score for Unstable Angina or Non-ST Elevation MI:   The patient's TIMI risk score is  , which indicates a  % risk of all cause mortality, new or recurrent myocardial infarction or need for urgent revascularization in the next 14 days.       Severity of Illness: The appropriate patient status for this patient is OBSERVATION. Observation status is judged to be reasonable and necessary in order to provide the required intensity of service to ensure the patient's safety. The patient's presenting symptoms, physical exam findings, and initial radiographic and laboratory data in the context of their medical condition is felt to place them at decreased risk for further clinical deterioration. Furthermore, it is anticipated that the patient will be medically stable for discharge from the hospital within 2 midnights of admission.    For questions or updates, please contact [facility] Please consult [email or web address] for contact info under     Signed, Armanda Magic, MD  04/25/2022 1:19 PM

## 2022-04-25 NOTE — ED Provider Notes (Signed)
[facility] EMERGENCY DEPARTMENT Provider Note   CSN: [id number] Arrival date & time: 04/24/22  2124     History  Chief Complaint  Patient presents with   Chest Pain    Daniel [name] is a 88 y.o. male.  Patient is an 88 year old male with a history of CAD status post CABG and then stenting and 2008, hypertension, heart palpitations who is presenting today with EMS due to chest pain.  Patient reports over the last 2 weeks he started noticing chest tightness with activity.  Initially it was occasional and now it is becoming more frequent.  Yesterday he walked out to the mailbox to get his mail and by the time he was coming out back he started having pain which she describes as a band type feeling around his chest which he sat down and tried to rest but it was getting worse so he took 1 nitroglycerin which seemed to improve his symptoms and then called 911.  When they arrived his pain was better but he received another nitroglycerin and aspirin and reports his pain resolved.  He feels slightly winded but more tired when he gets the pain.  Prior to yesterday it had always resolved at rest and he has not taken nitroglycerin in over 3 or 4 years.  He has not had recent cough or congestion.  It does not seem to be affected by eating.  He has had no nausea or vomiting.  Denies any leg swelling or abdominal pain.  He has not had any recent change in his medications.  The history is provided by the patient.  Chest Pain      Home Medications Prior to Admission medications   Medication Sig Start Date End Date Taking? Authorizing Provider  acetaminophen (TYLENOL) 500 MG tablet Take 1,000 mg by mouth every 8 (eight) hours as needed for mild pain.    [provider]  azelastine (ASTELIN) 0.1 % nasal spray Place 1-2 sprays into both nostrils 2 (two) times daily. 09/09/20   Kozlow, Alvira Philips, MD  cholecalciferol (VITAMIN D) 1000 units tablet Take 1,000 Units by mouth daily.     [provider]  clindamycin (CLEOCIN) 300 MG capsule Take 600 mg by mouth 2 (two) times daily. 03/04/21   [provider]  clopidogrel (PLAVIX) 75 MG tablet TAKE 1 TABLET(75 MG) BY MOUTH DAILY 04/06/22   Marykay Lex, MD  lisinopril (ZESTRIL) 10 MG tablet Take 10 mg in the morning  and 5 mg( 0.5 tablet) in the evening , may take an additional 0.5 tablet if blood pressure is greater than 180/90 07/17/21   Marykay Lex, MD  metoprolol succinate (TOPROL-XL) 50 MG 24 hr tablet TAKE 1 TABLET(50 MG) BY MOUTH IN THE MORNING AND AT BEDTIME 09/28/21   Marykay Lex, MD  montelukast (SINGULAIR) 10 MG tablet TAKE 1 TABLET(10 MG) BY MOUTH AT BEDTIME 08/10/21   Kozlow, Alvira Philips, MD  Multiple Vitamin (MULTIVITAMIN WITH MINERALS) TABS tablet Take 1 tablet by mouth daily.    [provider]  nitroGLYCERIN (NITROSTAT) 0.4 MG SL tablet PLACE 1 TABLET UNDER THE TONGUE EVERY 5 MINUTES AS NEEDED FOR CHEST PAIN FOR 3 DOSES. IF NOT RELIEF AFTER FIRST DOSE, CALL PRESCRIBER OR 911. 04/16/21   Marykay Lex, MD  Polyethylene Glycol 3350 (MIRALAX PO) 1 scoop bid Oral as needed 07/20/17   [provider]  tamsulosin (FLOMAX) 0.4 MG CAPS capsule Take 0.4 mg by mouth at bedtime.  06/08/14  [provider]  traMADol (ULTRAM) 50 MG tablet Take 50-100 mg by mouth 3 (three) times daily as needed. 03/16/21   [provider]  triamcinolone (NASACORT) 55 MCG/ACT AERO nasal inhaler Place 1 spray into the nose daily. 09/09/20   Kozlow, Alvira Philips, MD  vitamin B-12 (CYANOCOBALAMIN) 1000 MCG tablet Take 1,000 mcg by mouth 2 (two) times daily.     [provider]  vitamin C (ASCORBIC ACID) 250 MG tablet Take 250 mg by mouth daily.    [provider]  zolpidem (AMBIEN) 10 MG tablet Take 10 mg by mouth at bedtime as needed for sleep. 03/24/20   [provider]      Allergies    Penicillins, Sulfa antibiotics, and Statins    Review of Systems   Review of  Systems  Cardiovascular:  Positive for chest pain.    Physical Exam Updated Vital Signs BP (!) 185/84   Pulse 65   Temp 97.8 F (36.6 C)   Resp 12   Ht 6\' 3"  (1.905 m)   Wt 86.2 kg   SpO2 100%   BMI 23.75 kg/m  Physical Exam Vitals and nursing note reviewed.  Constitutional:      General: He is not in acute distress.    Appearance: He is well-developed.  HENT:     Head: Normocephalic and atraumatic.  Eyes:     Conjunctiva/sclera: Conjunctivae normal.     Pupils: Pupils are equal, round, and reactive to light.  Cardiovascular:     Rate and Rhythm: Normal rate and regular rhythm.     Heart sounds: No murmur heard. Pulmonary:     Effort: Pulmonary effort is normal. No respiratory distress.     Breath sounds: Normal breath sounds. No wheezing or rales.     Comments: Well-healed sternotomy scar Abdominal:     General: There is no distension.     Palpations: Abdomen is soft.     Tenderness: There is no abdominal tenderness. There is no guarding or rebound.  Musculoskeletal:        General: No tenderness. Normal range of motion.     Cervical back: Normal range of motion and neck supple.     Right lower leg: No edema.     Left lower leg: No edema.  Skin:    General: Skin is warm and dry.     Findings: No erythema or rash.  Neurological:     Mental Status: He is alert and oriented to person, place, and time. Mental status is at baseline.  Psychiatric:        Mood and Affect: Mood normal.        Behavior: Behavior normal.     ED Results / Procedures / Treatments   Labs (all labs ordered are listed, but only abnormal results are displayed) Labs Reviewed  BASIC METABOLIC PANEL - Abnormal; Notable for the following components:      Result Value   Glucose, Bld 141 (*)    BUN 33 (*)    All other components within normal limits  CBC - Abnormal; Notable for the following components:   RBC 4.18 (*)    Hemoglobin 12.7 (*)    HCT 38.7 (*)    All other components within  normal limits  TROPONIN I (HIGH SENSITIVITY)  TROPONIN I (HIGH SENSITIVITY)    EKG EKG Interpretation  Date/Time:  Saturday April 24 2022 21:44:03 EST Ventricular Rate:  65 PR Interval:  182 QRS Duration: 78 QT Interval:  380 QTC Calculation: 395 R Axis:   47 Text Interpretation: Normal sinus rhythm Nonspecific ST abnormality No significant change since last tracing When compared with ECG of 02-Aug-2021 18:15, PREVIOUS ECG IS PRESENT Confirmed by Gwyneth Sprout ([id number]) on 04/25/2022 9:15:46 AM  Radiology DG Chest 2 View  Result Date: 04/24/2022 CLINICAL DATA:  Intermittent left chest pain for 2 weeks. Worse at night. Prior triple bypass. Prior coronary stenting. EXAM: CHEST - 2 VIEW COMPARISON:  Chest 03/08/2021 FINDINGS: Old CABG. Heart size and vascular pattern are normal. There is calcification of the transverse aorta with normal mediastinal configuration. The lungs are clear with chronic moderate elevation right hemidiaphragm. The sulci are sharp. Osteopenia and mild thoracic kyphosis. Multilevel thoracic spine bridging enthesopathy. No acute spinal compression fracture. IMPRESSION: No active cardiopulmonary disease. Chronic moderate elevation of the right hemidiaphragm. Old CABG. Aortic atherosclerosis. Electronically Signed   By: Almira Bar M.D.   On: 04/24/2022 22:40    Procedures Procedures    Medications Ordered in ED Medications - No data to display  ED Course/ Medical Decision Making/ A&P                           Medical Decision Making Amount and/or Complexity of Data Reviewed Independent Historian: EMS External Data Reviewed: notes.    Details: cardiology Labs: ordered. Decision-making details documented in ED Course. Radiology: ordered and independent interpretation performed. Decision-making details documented in ED Course. ECG/medicine tests: ordered and independent interpretation performed. Decision-making details documented in ED  Course.  Risk Decision regarding hospitalization.   Pt with multiple medical problems and comorbidities and presenting today with a complaint that caries a high risk for morbidity and mortality. Pt with symptoms concerning for stable angina with HEART score 6. Associated symptoms include fagitue and SOB.  Low risk wells and low suspicion for PE, dissection, GERD or pneumonia. ASA and NTG given by EMS.  Patient is currently pain-free but does report when he had to walk from the lobby to the bed he started getting pain again but it resolved within 5 minutes.  I independently interpreted patient's EKG and labs today. EKG while pain-free was unchanged, CBC, BMP, trop x2 are all wnl and unchanged from prior. I have independently visualized and interpreted pt's images today.  Given patient's symptoms strong suspicion for stable angina.  We will consult with the cardiology team for further evaluation and care.  Pt seen by cards.  Started heparin and admitted.  CRITICAL CARE Performed by: Rayce Brahmbhatt Total critical care time: 30 minutes Critical care time was exclusive of separately billable procedures and treating other patients. Critical care was necessary to treat or prevent imminent or life-threatening deterioration. Critical care was time spent personally by me on the following activities: development of treatment plan with patient and/or surrogate as well as nursing, discussions with consultants, evaluation of patient's response to treatment, examination of patient, obtaining history from patient or surrogate, ordering and performing treatments and interventions, ordering and review of laboratory studies, ordering and review of radiographic studies, pulse oximetry and re-evaluation of patient's condition.         Final Clinical Impression(s) / ED Diagnoses Final diagnoses:  Stable angina    Rx / DC Orders ED Discharge Orders     None         Gwyneth Sprout, MD 04/25/22  1520

## 2022-04-26 ENCOUNTER — Inpatient Hospital Stay (HOSPITAL_COMMUNITY): Payer: Medicare Other

## 2022-04-26 ENCOUNTER — Encounter (HOSPITAL_COMMUNITY)
Admission: EM | Disposition: A | Discharge status: Home / Self Care | Payer: Self-pay | Source: Home / Self Care | Attending: Cardiology

## 2022-04-26 ENCOUNTER — Telehealth: Payer: Self-pay | Admitting: Cardiology

## 2022-04-26 ENCOUNTER — Ambulatory Visit: Payer: Medicare Other | Admitting: Cardiology

## 2022-04-26 DIAGNOSIS — R079 Chest pain, unspecified: Secondary | ICD-10-CM

## 2022-04-26 DIAGNOSIS — I25710 Atherosclerosis of autologous vein coronary artery bypass graft(s) with unstable angina pectoris: Secondary | ICD-10-CM

## 2022-04-26 DIAGNOSIS — E785 Hyperlipidemia, unspecified: Secondary | ICD-10-CM

## 2022-04-26 DIAGNOSIS — Z951 Presence of aortocoronary bypass graft: Secondary | ICD-10-CM

## 2022-04-26 HISTORY — PX: LEFT HEART CATH AND CORS/GRAFTS ANGIOGRAPHY: CATH118250

## 2022-04-26 HISTORY — PX: CORONARY STENT INTERVENTION: CATH118234

## 2022-04-26 HISTORY — PX: TRANSTHORACIC ECHOCARDIOGRAM: SHX275

## 2022-04-26 LAB — BASIC METABOLIC PANEL
Anion gap: 14 (ref 5–15)
BUN: 30 mg/dL — ABNORMAL HIGH (ref 8–23)
CO2: 22 mmol/L (ref 22–32)
Calcium: 9.2 mg/dL (ref 8.9–10.3)
Chloride: 105 mmol/L (ref 98–111)
Creatinine, Ser: 1.16 mg/dL (ref 0.61–1.24)
GFR, Estimated: >60 mL/min (ref >60)
Glucose, Bld: 135 mg/dL — ABNORMAL HIGH (ref 70–99)
Potassium: 4 mmol/L (ref 3.5–5.1)
Sodium: 141 mmol/L (ref 135–145)

## 2022-04-26 LAB — ECHOCARDIOGRAM COMPLETE
Area-P 1/2: 2.92 cm2
Height: 75 in
S' Lateral: 2.5 cm
Weight: 3040 oz

## 2022-04-26 LAB — LIPID PANEL
Cholesterol: 156 mg/dL (ref 0–200)
HDL: 34 mg/dL — ABNORMAL LOW (ref >40)
LDL Cholesterol: 98 mg/dL (ref 0–99)
Total CHOL/HDL Ratio: 4.6 RATIO
Triglycerides: 119 mg/dL (ref <150)
VLDL: 24 mg/dL (ref 0–40)

## 2022-04-26 LAB — CBC
HCT: 33.5 % — ABNORMAL LOW (ref 39.0–52.0)
Hemoglobin: 11.1 g/dL — ABNORMAL LOW (ref 13.0–17.0)
MCH: 30.7 pg (ref 26.0–34.0)
MCHC: 33.1 g/dL (ref 30.0–36.0)
MCV: 92.5 fL (ref 80.0–100.0)
Platelets: 199 10*3/uL (ref 150–400)
RBC: 3.62 MIL/uL — ABNORMAL LOW (ref 4.22–5.81)
RDW: 13.3 % (ref 11.5–15.5)
WBC: 8.6 10*3/uL (ref 4.0–10.5)
nRBC: 0 % (ref 0.0–0.2)

## 2022-04-26 LAB — POCT ACTIVATED CLOTTING TIME: Activated Clotting Time: 365 seconds

## 2022-04-26 LAB — HEPARIN LEVEL (UNFRACTIONATED): Heparin Unfractionated: 0.6 IU/mL (ref 0.30–0.70)

## 2022-04-26 SURGERY — LEFT HEART CATH AND CORS/GRAFTS ANGIOGRAPHY
Anesthesia: LOCAL

## 2022-04-26 MED ORDER — FENTANYL CITRATE (PF) 100 MCG/2ML IJ SOLN
INTRAMUSCULAR | Status: DC | PRN
  Administered 2022-04-26 (×5): 25 ug via INTRAVENOUS

## 2022-04-26 MED ORDER — MORPHINE SULFATE (PF) 2 MG/ML IV SOLN
1.0000 mg | INTRAVENOUS | Status: DC | PRN
  Administered 2022-04-26: 1 mg via INTRAVENOUS
  Filled 2022-04-26: qty 1

## 2022-04-26 MED ORDER — HEPARIN (PORCINE) IN NACL 1000-0.9 UT/500ML-% IV SOLN
INTRAVENOUS | Status: AC
  Filled 2022-04-26: qty 1000

## 2022-04-26 MED ORDER — SODIUM CHLORIDE 0.9% FLUSH: 3.0000 mL | INTRAVENOUS | Status: DC | PRN

## 2022-04-26 MED ORDER — VERAPAMIL HCL 2.5 MG/ML IV SOLN
INTRAVENOUS | Status: DC | PRN
  Administered 2022-04-26: 10 mL via INTRA_ARTERIAL

## 2022-04-26 MED ORDER — ENOXAPARIN SODIUM 40 MG/0.4ML IJ SOSY
40.0000 mg | PREFILLED_SYRINGE | INTRAMUSCULAR | Status: DC
  Administered 2022-04-27: 40 mg via SUBCUTANEOUS
  Filled 2022-04-26: qty 0.4

## 2022-04-26 MED ORDER — HEPARIN (PORCINE) IN NACL 1000-0.9 UT/500ML-% IV SOLN
INTRAVENOUS | Status: DC | PRN
  Administered 2022-04-26 (×2): 500 mL

## 2022-04-26 MED ORDER — HEPARIN SODIUM (PORCINE) 1000 UNIT/ML IJ SOLN
INTRAMUSCULAR | Status: AC
  Filled 2022-04-26: qty 10

## 2022-04-26 MED ORDER — NITROGLYCERIN 1 MG/10 ML FOR IR/CATH LAB
INTRA_ARTERIAL | Status: AC
  Filled 2022-04-26: qty 10

## 2022-04-26 MED ORDER — FENTANYL CITRATE (PF) 100 MCG/2ML IJ SOLN
INTRAMUSCULAR | Status: AC
  Filled 2022-04-26: qty 2

## 2022-04-26 MED ORDER — IOHEXOL 350 MG/ML SOLN
INTRAVENOUS | Status: DC | PRN
  Administered 2022-04-26: 152 mL

## 2022-04-26 MED ORDER — HYDRALAZINE HCL 20 MG/ML IJ SOLN
10.0000 mg | INTRAMUSCULAR | Status: AC | PRN
  Administered 2022-04-26 (×2): 10 mg via INTRAVENOUS
  Filled 2022-04-26: qty 1

## 2022-04-26 MED ORDER — VERAPAMIL HCL 2.5 MG/ML IV SOLN
INTRAVENOUS | Status: AC
  Filled 2022-04-26: qty 2

## 2022-04-26 MED ORDER — SODIUM CHLORIDE 0.9 % IV SOLN: INTRAVENOUS | Status: AC

## 2022-04-26 MED ORDER — NITROGLYCERIN 1 MG/10 ML FOR IR/CATH LAB
INTRA_ARTERIAL | Status: DC | PRN
  Administered 2022-04-26 (×2): 200 ug via INTRACORONARY

## 2022-04-26 MED ORDER — VERAPAMIL HCL 2.5 MG/ML IV SOLN
INTRAVENOUS | Status: DC | PRN
  Administered 2022-04-26: 2 mL via INTRA_ARTERIAL

## 2022-04-26 MED ORDER — MIDAZOLAM HCL 2 MG/2ML IJ SOLN
INTRAMUSCULAR | Status: AC
  Filled 2022-04-26: qty 2

## 2022-04-26 MED ORDER — LIDOCAINE HCL (PF) 1 % IJ SOLN
INTRAMUSCULAR | Status: AC
  Filled 2022-04-26: qty 30

## 2022-04-26 MED ORDER — SODIUM CHLORIDE 0.9 % IV SOLN: 250.0000 mL | INTRAVENOUS | Status: DC | PRN

## 2022-04-26 MED ORDER — VERAPAMIL HCL 2.5 MG/ML IV SOLN
INTRAVENOUS | Status: DC | PRN
  Administered 2022-04-26 (×2): 2 mL via INTRA_ARTERIAL

## 2022-04-26 MED ORDER — HYDRALAZINE HCL 20 MG/ML IJ SOLN
INTRAMUSCULAR | Status: AC
  Filled 2022-04-26: qty 1

## 2022-04-26 MED ORDER — LIDOCAINE HCL (PF) 1 % IJ SOLN
INTRAMUSCULAR | Status: DC | PRN
  Administered 2022-04-26: 2 mL

## 2022-04-26 MED ORDER — SODIUM CHLORIDE 0.9% FLUSH: 3.0000 mL | Freq: Two times a day (BID) | INTRAVENOUS | Status: DC

## 2022-04-26 MED ORDER — HEPARIN SODIUM (PORCINE) 1000 UNIT/ML IJ SOLN
INTRAMUSCULAR | Status: DC | PRN
  Administered 2022-04-26 (×2): 4000 [IU] via INTRAVENOUS

## 2022-04-26 MED ORDER — VERAPAMIL HCL 2.5 MG/ML IV SOLN
INTRAVENOUS | Status: DC | PRN
  Administered 2022-04-26: 5 mg via INTRACORONARY

## 2022-04-26 MED ORDER — MIDAZOLAM HCL 2 MG/2ML IJ SOLN
INTRAMUSCULAR | Status: DC | PRN
  Administered 2022-04-26 (×2): 1 mg via INTRAVENOUS

## 2022-04-26 SURGICAL SUPPLY — 22 items
BALLN EMERGE MR 2.0X12 (BALLOONS) ×1
BALLOON EMERGE MR 2.0X12 (BALLOONS) IMPLANT
CATH INFINITI 5 FR IM (CATHETERS) IMPLANT
CATH INFINITI 5FR MULTPACK ANG (CATHETERS) IMPLANT
CATH LAUNCHER 6FR JR4 (CATHETERS) IMPLANT
DEVICE RAD COMP TR BAND LRG (VASCULAR PRODUCTS) IMPLANT
DEVICE SPIDERFX EMB PROT 4MM (WIRE) IMPLANT
GLIDESHEATH SLEND SS 6F .021 (SHEATH) IMPLANT
GUIDEWIRE INQWIRE 1.5J.035X260 (WIRE) IMPLANT
INQWIRE 1.5J .035X260CM (WIRE) ×2
KIT ENCORE 26 ADVANTAGE (KITS) IMPLANT
KIT HEART LEFT (KITS) ×1 IMPLANT
PACK CARDIAC CATHETERIZATION (CUSTOM PROCEDURE TRAY) ×1 IMPLANT
SHEATH PROBE COVER 6X72 (BAG) IMPLANT
STENT SYNERGY XD 4.0X16 (Permanent Stent) IMPLANT
SYNERGY XD 4.0X16 (Permanent Stent) ×1 IMPLANT
SYR CONTROL 10ML ANGIOGRAPHIC (SYRINGE) IMPLANT
TRANSDUCER W/STOPCOCK (MISCELLANEOUS) ×1 IMPLANT
TUBING CIL FLEX 10 FLL-RA (TUBING) ×1 IMPLANT
WIRE ASAHI PROWATER 180CM (WIRE) IMPLANT
WIRE EMERALD ST .035X150CM (WIRE) IMPLANT
WIRE HI TORQ VERSACORE-J 145CM (WIRE) IMPLANT

## 2022-04-26 NOTE — H&P (View-Only) (Signed)
Rounding Note    Patient Name: Daniel [name] Date of Encounter: 04/26/2022  [facility] Cardiologist: Bryan Lemma, MD   Subjective   Sitting up on the side of the bed. No chest pain while at rest. Breathing is ok. Planned for cardiac cath today.   Inpatient Medications    Scheduled Meds:  aspirin EC  81 mg Oral Daily   azelastine  1-2 spray Each Nare BID   budesonide (PULMICORT) nebulizer solution  0.25 mg Nebulization BID   clopidogrel  75 mg Oral Daily   irbesartan  150 mg Oral Daily   metoprolol succinate  50 mg Oral BID   montelukast  10 mg Oral QHS   sodium chloride flush  3 mL Intravenous Q12H   tamsulosin  0.4 mg Oral QHS   Continuous Infusions:  sodium chloride     sodium chloride 1 mL/kg/hr (04/26/22 0459)   heparin 1,200 Units/hr (04/25/22 2200)   nitroGLYCERIN 40 mcg/min (04/26/22 0358)   PRN Meds: sodium chloride, acetaminophen, nitroGLYCERIN, ondansetron (ZOFRAN) IV, sodium chloride flush, traMADol, zolpidem   Vital Signs    Vitals:   04/26/22 0545 04/26/22 0600 04/26/22 0630 04/26/22 0818  BP: 128/73 122/73 111/63 122/75  Pulse: 79 75 82 (!) 104  Resp: 18 16 16    Temp:      TempSrc:      SpO2: 96% 97% 95%   Weight:      Height:        Intake/Output Summary (Last 24 hours) at 04/26/2022 0835 Last data filed at 04/25/2022 2200 Gross per 24 hour  Intake 140.4 ml  Output --  Net 140.4 ml      04/24/2022    9:35 PM 10/27/2021    3:07 PM 08/02/2021    6:11 PM  Last 3 Weights  Weight (lbs) 190 lb 195 lb 6.4 oz 194 lb  Weight (kg) 86.183 kg 88.633 kg 87.998 kg      Telemetry    Sinus Tach, rates low 100s - Personally Reviewed  ECG    No new tracing this morning  Physical Exam   GEN: No acute distress.   Neck: No JVD Cardiac: RRR, no murmurs, rubs, or gallops.  Respiratory: Clear to auscultation bilaterally. GI: Soft, nontender, non-distended  MS: No edema; No deformity. Neuro:  Nonfocal  Psych: Normal affect    Labs    High Sensitivity Troponin:   Recent Labs  Lab 04/24/22 2146 04/24/22 2349  TROPONINIHS 5 5     Chemistry Recent Labs  Lab 04/24/22 2146 04/26/22 0400  NA 139 141  K 4.1 4.0  CL 104 105  CO2 26 22  GLUCOSE 141* 135*  BUN 33* 30*  CREATININE 1.17 1.16  CALCIUM 9.4 9.2  GFRNONAA >60 >60  ANIONGAP 9 14    Lipids  Recent Labs  Lab 04/26/22 0400  CHOL 156  TRIG 119  HDL 34*  LDLCALC 98  CHOLHDL 4.6    Hematology Recent Labs  Lab 04/24/22 2146 04/26/22 0400  WBC 5.3 8.6  RBC 4.18* 3.62*  HGB 12.7* 11.1*  HCT 38.7* 33.5*  MCV 92.6 92.5  MCH 30.4 30.7  MCHC 32.8 33.1  RDW 13.2 13.3  PLT 235 199   Thyroid No results for input(s): "TSH", "FREET4" in the last 168 hours.  BNPNo results for input(s): "BNP", "PROBNP" in the last 168 hours.  DDimer No results for input(s): "DDIMER" in the last 168 hours.   Radiology    DG Chest 2  View  Result Date: 04/24/2022 CLINICAL DATA:  Intermittent left chest pain for 2 weeks. Worse at night. Prior triple bypass. Prior coronary stenting. EXAM: CHEST - 2 VIEW COMPARISON:  Chest 03/08/2021 FINDINGS: Old CABG. Heart size and vascular pattern are normal. There is calcification of the transverse aorta with normal mediastinal configuration. The lungs are clear with chronic moderate elevation right hemidiaphragm. The sulci are sharp. Osteopenia and mild thoracic kyphosis. Multilevel thoracic spine bridging enthesopathy. No acute spinal compression fracture. IMPRESSION: No active cardiopulmonary disease. Chronic moderate elevation of the right hemidiaphragm. Old CABG. Aortic atherosclerosis. Electronically Signed   By: Almira Bar M.D.   On: 04/24/2022 22:40    Cardiac Studies   N/a   Patient Profile     88 y.o. male with past medical history of CAD (s/p CABG in 1997, cath in 2017 showing atretic LIMA with SVG-D2 filling the LAD with retrograde flow with patent SVG-OM and patent stent in PDA), labile HTN, HLD  (intolerant to statins and refused alternative options), retinal vein occlusion and palpitations who is being seen 04/25/2022 for the evaluation of chest pain concerning for accelerating angina at the request of Dr. Anitra Lauth.   Assessment & Plan    Accelerated/unstable angina -- He has been having exertional chest discomfort for the past several weeks and now becoming more frequent and more severe with a quite severe episode day prior to admission when going to the mailbox. Symptoms were similar to what he has experienced with prior angina. -- High-sensitivity troponin negative x3, EKG without ischemia -- given worrisome symptoms, planned for cardiac cath today -- continue IV heparin drip well as IV nitroglycerin drip and titrate for blood pressure control -- Continue Plavix 75 mg daily, aspirin 81 mg daily, Toprol-XL 50 mg twice daily -- echo pending    Hypertension labile hypertension -- hx of poorly controlled hypertension, but also issues with orthostasis. Initially quite elevated on admission, but much improved -- remains on IV nitro, Toprol-XL 50 mg twice daily -- he was switched to irbesartan 150 mg daily for better BP control   Hyperlipidemia -- LDL goal less than 70, hx of statin intolerant and has refused injections with PCSK9 inhibitor -- LDL 98, HDL 34   ASCAD -- history of CABG but unfortunately LIMA became atretic with his SVG to diagonal filling the LAD via retrograde flow.  His last cath in 2017 showed patent SVG to OM and patent stent in the PDA -- Does not tolerate statin therapy and refused PCSK9 inhibitor  -- Continue ASA, Plavix 75 mg daily  For questions or updates, please contact [facility] Please consult [email or web address] for contact info under        Signed, Laverda Page, NP  04/26/2022, 8:35 AM

## 2022-04-26 NOTE — Progress Notes (Signed)
Paged MD Annie Paras at 1750 d/t pt complaining of mild chest pain. Orders to give morphine. Patient resting in bed.

## 2022-04-26 NOTE — Progress Notes (Signed)
Echocardiogram 2D Echocardiogram has been performed.  Warren Lacy Berneice Zettlemoyer RDCS 04/26/2022, 9:50 AM

## 2022-04-26 NOTE — Interval H&P Note (Signed)
History and Physical Interval Note:  04/26/2022 2:01 PM  Daniel [name]  has presented today for surgery, with the diagnosis of unstable angina.  The various methods of treatment have been discussed with the patient and family. After consideration of risks, benefits and other options for treatment, the patient has consented to  Procedure(s): LEFT HEART CATH AND CORS/GRAFTS ANGIOGRAPHY (N/A) as a surgical intervention.  The patient's history has been reviewed, patient examined, no change in status, stable for surgery.  I have reviewed the patient's chart and labs.  Questions were answered to the patient's satisfaction.   Cath Lab Visit (complete for each Cath Lab visit)  Clinical Evaluation Leading to the Procedure:   ACS: Yes.    Non-ACS:    Anginal Classification: CCS III  Anti-ischemic medical therapy: Maximal Therapy (2 or more classes of medications)  Non-Invasive Test Results: No non-invasive testing performed  Prior CABG: Previous CABG        Theron Arista [facility] 04/26/2022 2:01 PM

## 2022-04-26 NOTE — Progress Notes (Signed)
ANTICOAGULATION CONSULT NOTE  Pharmacy Consult for Heparin Indication: chest pain/ACS  Allergies  Allergen Reactions   Penicillins Swelling and Other (See Comments)    Swelling and redness in joints  Has patient had a PCN reaction causing immediate rash, facial/tongue/throat swelling, SOB or lightheadedness with hypotension: No Has patient had a PCN reaction causing severe rash involving mucus membranes or skin necrosis: No Has patient had a PCN reaction that required hospitalization: No Has patient had a PCN reaction occurring within the last 10 years: No If all of the above answers are "NO", then may proceed with Cephalosporin use.    Sulfa Antibiotics Other (See Comments)    Pain in left side underneath ribcage-pancreas   Statins Other (See Comments)    UNSPECIFIED REACTION  "INTOLERANCE AT HIGH DOSES"    Patient Measurements: Height: 6\' 3"  (190.5 cm) Weight: 86.2 kg (190 lb) IBW/kg (Calculated) : 84.5 Heparin Dosing Weight: 86.2 kg   Vital Signs: Temp: 98.5 F (36.9 C) (11/20 0425) Temp Source: Oral (11/20 0425) BP: 111/63 (11/20 0630) Pulse Rate: 82 (11/20 0630)  Labs: Recent Labs    04/24/22 2146 04/24/22 2349 04/25/22 1257 04/25/22 2217 04/26/22 0400  HGB 12.7*  --   --   --  11.1*  HCT 38.7*  --   --   --  33.5*  PLT 235  --   --   --  199  LABPROT  --   --  13.3  --   --   INR  --   --  1.0  --   --   HEPARINUNFRC  --   --   --  0.55 0.60  CREATININE 1.17  --   --   --  1.16  TROPONINIHS 5 5  --   --   --      Estimated Creatinine Clearance: 53.6 mL/min (by C-G formula based on SCr of 1.16 mg/dL).   Medical History: Past Medical History:  Diagnosis Date   Arthritis    CAD of autologous arterial graft 2010   Atretic LIMA; Myoview Jan 2015: Possible mild basal anteroseptal defect thought to be artifact (CRO early LAD ischemia due to inadeuate retrograde perfusion via SVG-Diag   CAD S/P percutaneous coronary angioplasty 11/2006   a. 6/'08: PCI  nongrafted distal RCA-PDA: Mini vision BMS 2.25 mm x 28 mm; b. Relook Cath 08/2015: Stable CAD. No culprit lesion. Widely patent PDA stent. Patent SVG-OM 2, SVG-D2. Essentially atretic/small LIMA-LAD, but the LAD is perfused via SVG-D2   Complication of anesthesia    Coronary artery disease involving left main coronary artery [id number]   Last cath 2010: 70-80 % ostial left main, 80-90% LAD, subtotal CTO LCx-OM2, RCA patent w/ patent BMS stent (dRCA-PDA); SVG-diagonal backfills LAD, atretic LIMA-LAD -- medical therapy; stable by relook cath in March 2017   Difficult intubation    VERY NARROW AIRWAY PER ANESTHESIOLOGIST (1997 CABG AT [facility])   Dyslipidemia, goal LDL below 70    Dysrhythmia    Heart palpitations    Never fully diagnosed.   History of kidney stones    Hypertension    Insomnia    Mass in chest    JUST WATCHING , WILL CHECK OUT AFTER SURGERY   Pneumonia    S/P CABG x 3 1997   LIMA-LAD, SVG-D2, SVG-OM --> LIMA known to be atretic, but SVG-D2 fills diagonal and LAD   Stenosis of cervical spine with myelopathy (HCC)    Tingling    in fingers  Medications:  (Not in a hospital admission)  Scheduled:   aspirin EC  81 mg Oral Daily   azelastine  1-2 spray Each Nare BID   budesonide (PULMICORT) nebulizer solution  0.25 mg Nebulization BID   clopidogrel  75 mg Oral Daily   irbesartan  150 mg Oral Daily   metoprolol succinate  50 mg Oral BID   montelukast  10 mg Oral QHS   sodium chloride flush  3 mL Intravenous Q12H   tamsulosin  0.4 mg Oral QHS   Infusions:   sodium chloride     sodium chloride 1 mL/kg/hr (04/26/22 0459)   heparin 1,200 Units/hr (04/25/22 2200)   nitroGLYCERIN 40 mcg/min (04/26/22 0358)   PRN: sodium chloride, acetaminophen, nitroGLYCERIN, ondansetron (ZOFRAN) IV, sodium chloride flush, traMADol, zolpidem  Assessment: 89 yom with a history of CAD s/p CABG and stenting, HTN, heart palpitations. Patient is presenting with chest pain. Heparin per pharmacy  consult placed for chest pain/ACS.  Heparin level remains therapeutic on 1200 units/hr  Goal of Therapy:  Heparin level 0.3-0.7 units/ml Monitor platelets by anticoagulation protocol: Yes   Plan:  Continue heparin gtt at 1200 units/hr Daily HL, CBC, s/s bleeding F/u cath  Daylene Posey, PharmD Clinical Pharmacist ED Pharmacist Phone # [phone] 04/26/2022 7:15 AM

## 2022-04-26 NOTE — Progress Notes (Signed)
   Pt with some mild chest pain.  Discussed with Dr. [place] no aggrastat or imdur for now.  Will monitor added 1 mg morphine due to age.    Nada Stroebel, FNP-C [facility]  Pgr:[phone] or after 5pm and on weekends call [phone] 04/26/2022.now

## 2022-04-26 NOTE — Telephone Encounter (Signed)
Returned call to patient left message on personal voice mail I will make Dr.Harding aware you are currently admitted to [facility] hospital.

## 2022-04-26 NOTE — Telephone Encounter (Signed)
Patient calling to inform he is currently in the hospital.

## 2022-04-26 NOTE — Progress Notes (Signed)
Rounding Note    Patient Name: Daniel [name] Date of Encounter: 04/26/2022  [facility] Cardiologist: Bryan Lemma, MD   Subjective   Sitting up on the side of the bed. No chest pain while at rest. Breathing is ok. Planned for cardiac cath today.   Inpatient Medications    Scheduled Meds:  aspirin EC  81 mg Oral Daily   azelastine  1-2 spray Each Nare BID   budesonide (PULMICORT) nebulizer solution  0.25 mg Nebulization BID   clopidogrel  75 mg Oral Daily   irbesartan  150 mg Oral Daily   metoprolol succinate  50 mg Oral BID   montelukast  10 mg Oral QHS   sodium chloride flush  3 mL Intravenous Q12H   tamsulosin  0.4 mg Oral QHS   Continuous Infusions:  sodium chloride     sodium chloride 1 mL/kg/hr (04/26/22 0459)   heparin 1,200 Units/hr (04/25/22 2200)   nitroGLYCERIN 40 mcg/min (04/26/22 0358)   PRN Meds: sodium chloride, acetaminophen, nitroGLYCERIN, ondansetron (ZOFRAN) IV, sodium chloride flush, traMADol, zolpidem   Vital Signs    Vitals:   04/26/22 0545 04/26/22 0600 04/26/22 0630 04/26/22 0818  BP: 128/73 122/73 111/63 122/75  Pulse: 79 75 82 (!) 104  Resp: 18 16 16    Temp:      TempSrc:      SpO2: 96% 97% 95%   Weight:      Height:        Intake/Output Summary (Last 24 hours) at 04/26/2022 0835 Last data filed at 04/25/2022 2200 Gross per 24 hour  Intake 140.4 ml  Output --  Net 140.4 ml      04/24/2022    9:35 PM 10/27/2021    3:07 PM 08/02/2021    6:11 PM  Last 3 Weights  Weight (lbs) 190 lb 195 lb 6.4 oz 194 lb  Weight (kg) 86.183 kg 88.633 kg 87.998 kg      Telemetry    Sinus Tach, rates low 100s - Personally Reviewed  ECG    No new tracing this morning  Physical Exam   GEN: No acute distress.   Neck: No JVD Cardiac: RRR, no murmurs, rubs, or gallops.  Respiratory: Clear to auscultation bilaterally. GI: Soft, nontender, non-distended  MS: No edema; No deformity. Neuro:  Nonfocal  Psych: Normal affect    Labs    High Sensitivity Troponin:   Recent Labs  Lab 04/24/22 2146 04/24/22 2349  TROPONINIHS 5 5     Chemistry Recent Labs  Lab 04/24/22 2146 04/26/22 0400  NA 139 141  K 4.1 4.0  CL 104 105  CO2 26 22  GLUCOSE 141* 135*  BUN 33* 30*  CREATININE 1.17 1.16  CALCIUM 9.4 9.2  GFRNONAA >60 >60  ANIONGAP 9 14    Lipids  Recent Labs  Lab 04/26/22 0400  CHOL 156  TRIG 119  HDL 34*  LDLCALC 98  CHOLHDL 4.6    Hematology Recent Labs  Lab 04/24/22 2146 04/26/22 0400  WBC 5.3 8.6  RBC 4.18* 3.62*  HGB 12.7* 11.1*  HCT 38.7* 33.5*  MCV 92.6 92.5  MCH 30.4 30.7  MCHC 32.8 33.1  RDW 13.2 13.3  PLT 235 199   Thyroid No results for input(s): "TSH", "FREET4" in the last 168 hours.  BNPNo results for input(s): "BNP", "PROBNP" in the last 168 hours.  DDimer No results for input(s): "DDIMER" in the last 168 hours.   Radiology    DG Chest 2  View  Result Date: 04/24/2022 CLINICAL DATA:  Intermittent left chest pain for 2 weeks. Worse at night. Prior triple bypass. Prior coronary stenting. EXAM: CHEST - 2 VIEW COMPARISON:  Chest 03/08/2021 FINDINGS: Old CABG. Heart size and vascular pattern are normal. There is calcification of the transverse aorta with normal mediastinal configuration. The lungs are clear with chronic moderate elevation right hemidiaphragm. The sulci are sharp. Osteopenia and mild thoracic kyphosis. Multilevel thoracic spine bridging enthesopathy. No acute spinal compression fracture. IMPRESSION: No active cardiopulmonary disease. Chronic moderate elevation of the right hemidiaphragm. Old CABG. Aortic atherosclerosis. Electronically Signed   By: Almira Bar M.D.   On: 04/24/2022 22:40    Cardiac Studies   N/a   Patient Profile     88 y.o. male with past medical history of CAD (s/p CABG in 1997, cath in 2017 showing atretic LIMA with SVG-D2 filling the LAD with retrograde flow with patent SVG-OM and patent stent in PDA), labile HTN, HLD  (intolerant to statins and refused alternative options), retinal vein occlusion and palpitations who is being seen 04/25/2022 for the evaluation of chest pain concerning for accelerating angina at the request of Dr. Anitra Lauth.   Assessment & Plan    Accelerated/unstable angina -- He has been having exertional chest discomfort for the past several weeks and now becoming more frequent and more severe with a quite severe episode day prior to admission when going to the mailbox. Symptoms were similar to what he has experienced with prior angina. -- High-sensitivity troponin negative x3, EKG without ischemia -- given worrisome symptoms, planned for cardiac cath today -- continue IV heparin drip well as IV nitroglycerin drip and titrate for blood pressure control -- Continue Plavix 75 mg daily, aspirin 81 mg daily, Toprol-XL 50 mg twice daily -- echo pending    Hypertension labile hypertension -- hx of poorly controlled hypertension, but also issues with orthostasis. Initially quite elevated on admission, but much improved -- remains on IV nitro, Toprol-XL 50 mg twice daily -- he was switched to irbesartan 150 mg daily for better BP control   Hyperlipidemia -- LDL goal less than 70, hx of statin intolerant and has refused injections with PCSK9 inhibitor -- LDL 98, HDL 34   ASCAD -- history of CABG but unfortunately LIMA became atretic with his SVG to diagonal filling the LAD via retrograde flow.  His last cath in 2017 showed patent SVG to OM and patent stent in the PDA -- Does not tolerate statin therapy and refused PCSK9 inhibitor  -- Continue ASA, Plavix 75 mg daily  For questions or updates, please contact [facility] Please consult [email or web address] for contact info under        Signed, Laverda Page, NP  04/26/2022, 8:35 AM

## 2022-04-27 ENCOUNTER — Encounter (HOSPITAL_COMMUNITY): Payer: Self-pay | Admitting: Cardiology

## 2022-04-27 LAB — CBC
HCT: 34.8 % — ABNORMAL LOW (ref 39.0–52.0)
Hemoglobin: 12 g/dL — ABNORMAL LOW (ref 13.0–17.0)
MCH: 31 pg (ref 26.0–34.0)
MCHC: 34.5 g/dL (ref 30.0–36.0)
MCV: 89.9 fL (ref 80.0–100.0)
Platelets: 212 10*3/uL (ref 150–400)
RBC: 3.87 MIL/uL — ABNORMAL LOW (ref 4.22–5.81)
RDW: 13.6 % (ref 11.5–15.5)
WBC: 8.2 10*3/uL (ref 4.0–10.5)
nRBC: 0 % (ref 0.0–0.2)

## 2022-04-27 LAB — BASIC METABOLIC PANEL
Anion gap: 9 (ref 5–15)
BUN: 20 mg/dL (ref 8–23)
CO2: 22 mmol/L (ref 22–32)
Calcium: 8.9 mg/dL (ref 8.9–10.3)
Chloride: 108 mmol/L (ref 98–111)
Creatinine, Ser: 1.01 mg/dL (ref 0.61–1.24)
GFR, Estimated: >60 mL/min (ref >60)
Glucose, Bld: 121 mg/dL — ABNORMAL HIGH (ref 70–99)
Potassium: 3.6 mmol/L (ref 3.5–5.1)
Sodium: 139 mmol/L (ref 135–145)

## 2022-04-27 LAB — HEMOGLOBIN A1C
Hgb A1c MFr Bld: 5.6 % (ref 4.8–5.6)
Mean Plasma Glucose: 114.02 mg/dL

## 2022-04-27 LAB — LIPOPROTEIN A (LPA): Lipoprotein (a): 191.2 nmol/L — ABNORMAL HIGH (ref <75.0)

## 2022-04-27 MED ORDER — IRBESARTAN 150 MG PO TABS
300.0000 mg | ORAL_TABLET | Freq: Every day | ORAL | Status: DC
  Administered 2022-04-27: 300 mg via ORAL
  Filled 2022-04-27: qty 2

## 2022-04-27 MED ORDER — METOPROLOL SUCCINATE ER 25 MG PO TB24: 75.0000 mg | ORAL_TABLET | Freq: Two times a day (BID) | ORAL | 2 refills | Status: DC

## 2022-04-27 MED ORDER — METOPROLOL SUCCINATE ER 50 MG PO TB24
75.0000 mg | ORAL_TABLET | Freq: Two times a day (BID) | ORAL | Status: DC
  Administered 2022-04-27: 75 mg via ORAL
  Filled 2022-04-27: qty 1

## 2022-04-27 MED ORDER — ASPIRIN 81 MG PO TBEC: 81.0000 mg | DELAYED_RELEASE_TABLET | Freq: Every day | ORAL | 2 refills | Status: DC

## 2022-04-27 MED ORDER — IRBESARTAN 300 MG PO TABS: 300.0000 mg | ORAL_TABLET | Freq: Every day | ORAL | 1 refills | Status: DC

## 2022-04-27 NOTE — TOC Transition Note (Signed)
Transition of Care [facility]) - CM/SW Discharge Note   Patient Details  Name: Daniel [name] MRN: [id number] Date of Birth: 04-13-1933  [facility] CM/SW Contact:  Leone Haven, RN Phone Number: 04/27/2022, 9:50 AM   Clinical Narrative:    Patient is for dc, his son is in the room and will transport him home today.  He has no needs.          Patient Goals and CMS Choice        Discharge Placement                       Discharge Plan and Services                                     Social Determinants of Health (SDOH) Interventions     Readmission Risk Interventions     No data to display

## 2022-04-27 NOTE — Discharge Summary (Signed)
Discharge Summary    Patient ID: Daniel [name] MRN: [id number]; DOB: 02-Jan-1933  Admit date: 04/24/2022 Discharge date: 04/27/2022  PCP:  Burnard Bunting, MD   [facility] Cardiologist:  Glenetta Hew, MD      Discharge Diagnoses    Principal Problem:   Unstable angina [facility]) Active Problems:   S/P CABG x 3   Labile essential hypertension   Dyslipidemia, goal LDL below 70    Diagnostic Studies/Procedures    Cath: 04/26/22    Ost LM to Mid LM lesion is 60% stenosed.   Ost LAD to Prox LAD lesion is 70% stenosed.   Prox LAD to Mid LAD lesion is 100% stenosed.   Dist RCA lesion is 40% stenosed.   Prox Cx to Mid Cx lesion is 100% stenosed.   Mid Graft lesion is 95% stenosed.   Previously placed RPDA stent of unknown type is  widely patent.   A drug-eluting stent was successfully placed using a SYNERGY XD 4.0X16.   Post intervention, there is a 0% residual stenosis.   SVG graft was visualized by angiography and is normal in caliber.   SVG graft was visualized by angiography and is normal in caliber.   LIMA graft was visualized by angiography and is normal in caliber.   The graft exhibits no disease.   The graft exhibits no disease.   LV end diastolic pressure is mildly elevated.   2 vessel occlusive CAD.  Continued patency of stent in the PDA Patent LIMA to the LAD (previously described as atretic) Patent SVG to second diagonal Patent SVG to OM2 but with critical stenosis in the mid SVG Mildly elevated LVEDP 22 mm Hg Successful PCI of SVG to OM2 with DES. Transient no reflow that resolved with IC verapamil.   Plan: DAPT for at least one year.  Diagnostic Dominance: Right  Intervention   Echo: 04/26/22  IMPRESSIONS     1. Left ventricular ejection fraction, by estimation, is 60 to 65%. The  left ventricle has normal function. The left ventricle has no regional  wall motion abnormalities. There is mild concentric left ventricular   hypertrophy. Left ventricular diastolic  parameters are consistent with Grade I diastolic dysfunction (impaired  relaxation).   2. Right ventricular systolic function is normal. The right ventricular  size is normal.   3. Left atrial size was mildly dilated.   4. The mitral valve is normal in structure. Trivial mitral valve  regurgitation. No evidence of mitral stenosis.   5. The aortic valve is tricuspid. Aortic valve regurgitation is not  visualized. No aortic stenosis is present.   6. The inferior vena cava is normal in size with greater than 50%  respiratory variability, suggesting right atrial pressure of 3 mmHg.   FINDINGS   Left Ventricle: Left ventricular ejection fraction, by estimation, is 60  to 65%. The left ventricle has normal function. The left ventricle has no  regional wall motion abnormalities. The left ventricular internal cavity  size was normal in size. There is   mild concentric left ventricular hypertrophy. Left ventricular diastolic  parameters are consistent with Grade I diastolic dysfunction (impaired  relaxation). Normal left ventricular filling pressure.   Right Ventricle: The right ventricular size is normal. No increase in  right ventricular wall thickness. Right ventricular systolic function is  normal.   Left Atrium: Left atrial size was mildly dilated.   Right Atrium: Right atrial size was normal in size.   Pericardium: There is no  evidence of pericardial effusion.   Mitral Valve: The mitral valve is normal in structure. Mild mitral annular  calcification. Trivial mitral valve regurgitation. No evidence of mitral  valve stenosis.   Tricuspid Valve: The tricuspid valve is normal in structure. Tricuspid  valve regurgitation is not demonstrated. No evidence of tricuspid  stenosis.   Aortic Valve: The aortic valve is tricuspid. Aortic valve regurgitation is  not visualized. No aortic stenosis is present.   Pulmonic Valve: The pulmonic valve  was normal in structure. Pulmonic valve  regurgitation is trivial. No evidence of pulmonic stenosis.   Aorta: The aortic root is normal in size and structure.   Venous: The inferior vena cava is normal in size with greater than 50%  respiratory variability, suggesting right atrial pressure of 3 mmHg.   IAS/Shunts: No atrial level shunt detected by color flow Doppler.   _____________   History of Present Illness     Daniel [name] is a 88 y.o. male with past medical history of CAD (s/p CABG in 1997, cath in 2017 showing atretic LIMA with SVG-D2 filling the LAD with retrograde flow with patent SVG-OM and patent stent in PDA), labile HTN, HLD (intolerant to statins and refused alternative options), retinal vein occlusion and palpitations who was seen 04/25/2022 for the evaluation of chest pain concerning for accelerating angina at the request of Dr. Maryan Rued.   Mr. Thornsberry was examined by Dr. Ellyn Hack in 10/2021 and reported BP has been labile at home and was recommend to increase Lisinopril to 10 mg in the a.m. with 5 mg in the PM and he was instructed take an additional 5 mg in the evening if needed.   Recently he had been having episodes of chest pain over the past few weeks and shortness of breath.  He reports that he will be exerting himself and will all of a sudden feel profoundly weak and then developed shortness of breath and has to sit down and then starts getting discomfort in his chest.  Initially his chest pain was just occasional but then become more frequent and more severe.  Day prior to admission he was walking to his mailbox and by the time he came back he started having chest pain that felt like a band type feeling across his chest and he had to sit down and rest.  At that time he took a sublingual nitroglycerin but symptoms persisted and he called 911.  He was given another sublingual nitroglycerin with improvement in his pain which eventually resolved.  ER he then got up to go to the  bathroom and again became weak and shortness of breath with chest discomfort and had to lay back down.  Cardiology is now asked to consult.   Reports his symptoms are similar to what he had prior to his bypass and his PCI.  He has been actually quite stable except for a trending upwards of his blood pressure over the past couple of months.  Currently he is pain-free.  High-sensitivity troponin was normal and flat at 6>> 5>> 5.  Serum creatinine 1.17, potassium 4.1, hemoglobin 12.7 and platelet count 235.      Hospital Course     Unstable angina -- Had been experiencing exertional chest discomfort for several weeks prior to admission.  High-sensitivity troponin were negative x3, EKG without ischemia.  Given his worsening symptoms he underwent cardiac catheterization noted above two-vessel occlusive CAD with patent LIMA to LAD (recently described as atretic), patent SVG to second diagonal, patent  SVG to OM 2 with critical stenosis in the mid graft treated with PCI/DES x1.  Had transient no reflow which resolved with intracoronary verapamil.  Recommendations for DAPT with aspirin/Plavix for at least 1 year.  Seen by cardiac rehab.  No recurrent chest pain.  Echocardiogram showed LVEF of 60 to 65% no regional wall motion abnormality, grade 1 diastolic dysfunction, normal RV size and function. -- Continue aspirin, Plavix, metoprolol XL, irbesartan  Labile hypertension -- History of poorly controlled hypertension but also issues with orthostasis -- Transitioned from lisinopril to irbesartan as well as Toprol XL increased to 75 mg twice daily  Hyperlipidemia -- History of multiple statin intolerances and has refused injections with PCSK9 inhibitor in the past -- LDL 98, HDL 34 -- Agreeable for referral back to the lipid clinic to consider inclisiran  Patient was seen by Dr. Martinique and deemed stable for discharge home.  Follow-up in the office arranged.  Medication sent to patient pharmacy.  Educated by  [employer].D. prior to discharge.  Did the patient have an acute coronary syndrome (MI, NSTEMI, STEMI, etc) this admission?:  Yes                               AHA/ACC Clinical Performance & Quality Measures: Aspirin prescribed? - Yes ADP Receptor Inhibitor (Plavix/Clopidogrel, Brilinta/Ticagrelor or Effient/Prasugrel) prescribed (includes medically managed patients)? - Yes Beta Blocker prescribed? - Yes High Intensity Statin (Lipitor 40-35m or Crestor 20-478m prescribed? - No - statin intolerant EF assessed during THIS hospitalization? - Yes For EF <40%, was ACEI/ARB prescribed? - Not Applicable (EF >/= 4034%For EF <40%, Aldosterone Antagonist (Spironolactone or Eplerenone) prescribed? - Not Applicable (EF >/= 4091%Cardiac Rehab Phase II ordered (including medically managed patients)? - Yes       The patient will be scheduled for a TOC follow up appointment in 10-14 days.  A [facility] the [facility] at the office where the patient should be seen for follow up.  _____________  Discharge Vitals Blood pressure 133/64, pulse 98, temperature 98.3 F (36.8 C), temperature source Oral, resp. rate 18, height _0  (1.905 m), weight 86.2 kg, SpO2 94 %.  Filed Weights   04/24/22 2135  Weight: 86.2 kg    Labs & Radiologic Studies    CBC Recent Labs    04/26/22 0400 04/27/22 0014  WBC 8.6 8.2  HGB 11.1* 12.0*  HCT 33.5* 34.8*  MCV 92.5 89.9  PLT 199 21791 Basic Metabolic Panel Recent Labs    04/26/22 0400 04/27/22 0014  NA 141 139  K 4.0 3.6  CL 105 108  CO2 22 22  GLUCOSE 135* 121*  BUN 30* 20  CREATININE 1.16 1.01  CALCIUM 9.2 8.9   Liver Function Tests No results for input(s): "AST", "ALT", "ALKPHOS", "BILITOT", "PROT", "ALBUMIN" in the last 72 hours. No results for input(s): "LIPASE", "AMYLASE" in the last 72 hours. High Sensitivity Troponin:   Recent Labs  Lab 04/24/22 2146 04/24/22 2349  TROPONINIHS 5 5    BNP Invalid input(s):  "POCBNP" D-Dimer No results for input(s): "DDIMER" in the last 72 hours. Hemoglobin A1C Recent Labs    04/27/22 0014  HGBA1C 5.6   Fasting Lipid Panel Recent Labs    04/26/22 0400  CHOL 156  HDL 34*  LDLCALC 98  TRIG 119  CHOLHDL 4.6   Thyroid Function Tests No results for input(s): "TSH", "T4TOTAL", "  T3FREE", "THYROIDAB" in the last 72 hours.  Invalid input(s): "FREET3" _____________  CARDIAC CATHETERIZATION  Result Date: 04/26/2022   Ost LM to Mid LM lesion is 60% stenosed.   Ost LAD to Prox LAD lesion is 70% stenosed.   Prox LAD to Mid LAD lesion is 100% stenosed.   Dist RCA lesion is 40% stenosed.   Prox Cx to Mid Cx lesion is 100% stenosed.   Mid Graft lesion is 95% stenosed.   Previously placed RPDA stent of unknown type is  widely patent.   A drug-eluting stent was successfully placed using a SYNERGY XD 4.0X16.   Post intervention, there is a 0% residual stenosis.   SVG graft was visualized by angiography and is normal in caliber.   SVG graft was visualized by angiography and is normal in caliber.   LIMA graft was visualized by angiography and is normal in caliber.   The graft exhibits no disease.   The graft exhibits no disease.   LV end diastolic pressure is mildly elevated. 2 vessel occlusive CAD. Continued patency of stent in the PDA Patent LIMA to the LAD (previously described as atretic) Patent SVG to second diagonal Patent SVG to OM2 but with critical stenosis in the mid SVG Mildly elevated LVEDP 22 mm Hg Successful PCI of SVG to OM2 with DES. Transient no reflow that resolved with IC verapamil. Plan: DAPT for at least one year.   ECHOCARDIOGRAM COMPLETE  Result Date: 04/26/2022    ECHOCARDIOGRAM REPORT   Patient Name:   Daniel [name] Date of Exam: 04/26/2022 Medical Rec #:  [id number]      Height:       75.0 in Accession #:    [id number]     Weight:       190.0 lb Date of Birth:  [birth year]       BSA:          2.147 m Patient Age:    68 years       BP:           104/67  mmHg Patient Gender: M              HR:           85 bpm. Exam Location:  Inpatient Procedure: 2D Echo, Color Doppler, Cardiac Doppler and 3D Echo Indications:    R07.9* Chest pain, unspecified  History:        Patient has no prior history of Echocardiogram examinations.                 Prior CABG; Risk Factors:Hypertension and Dyslipidemia.  Sonographer:    Raquel Sarna Senior RDCS Referring Phys: [place]  1. Left ventricular ejection fraction, by estimation, is 60 to 65%. The left ventricle has normal function. The left ventricle has no regional wall motion abnormalities. There is mild concentric left ventricular hypertrophy. Left ventricular diastolic parameters are consistent with Grade I diastolic dysfunction (impaired relaxation).  2. Right ventricular systolic function is normal. The right ventricular size is normal.  3. Left atrial size was mildly dilated.  4. The mitral valve is normal in structure. Trivial mitral valve regurgitation. No evidence of mitral stenosis.  5. The aortic valve is tricuspid. Aortic valve regurgitation is not visualized. No aortic stenosis is present.  6. The inferior vena cava is normal in size with greater than 50% respiratory variability, suggesting right atrial pressure of 3 mmHg. FINDINGS  Left Ventricle: Left ventricular ejection fraction, by estimation,  is 60 to 65%. The left ventricle has normal function. The left ventricle has no regional wall motion abnormalities. The left ventricular internal cavity size was normal in size. There is  mild concentric left ventricular hypertrophy. Left ventricular diastolic parameters are consistent with Grade I diastolic dysfunction (impaired relaxation). Normal left ventricular filling pressure. Right Ventricle: The right ventricular size is normal. No increase in right ventricular wall thickness. Right ventricular systolic function is normal. Left Atrium: Left atrial size was mildly dilated. Right Atrium: Right atrial  size was normal in size. Pericardium: There is no evidence of pericardial effusion. Mitral Valve: The mitral valve is normal in structure. Mild mitral annular calcification. Trivial mitral valve regurgitation. No evidence of mitral valve stenosis. Tricuspid Valve: The tricuspid valve is normal in structure. Tricuspid valve regurgitation is not demonstrated. No evidence of tricuspid stenosis. Aortic Valve: The aortic valve is tricuspid. Aortic valve regurgitation is not visualized. No aortic stenosis is present. Pulmonic Valve: The pulmonic valve was normal in structure. Pulmonic valve regurgitation is trivial. No evidence of pulmonic stenosis. Aorta: The aortic root is normal in size and structure. Venous: The inferior vena cava is normal in size with greater than 50% respiratory variability, suggesting right atrial pressure of 3 mmHg. IAS/Shunts: No atrial level shunt detected by color flow Doppler.  LEFT VENTRICLE PLAX 2D LVIDd:         3.50 cm   Diastology LVIDs:         2.50 cm   LV e' medial:    6.96 cm/s LV PW:         1.20 cm   LV E/e' medial:  7.3 LV IVS:        1.10 cm   LV e' lateral:   6.85 cm/s LVOT diam:     2.10 cm   LV E/e' lateral: 7.4 LV SV:         62 LV SV Index:   29 LVOT Area:     3.46 cm  RIGHT VENTRICLE RV S prime:     11.20 cm/s TAPSE (M-mode): 1.1 cm LEFT ATRIUM             Index        RIGHT ATRIUM           Index LA diam:        4.20 cm 1.96 cm/m   RA Area:     12.70 cm LA Vol (A2C):   77.6 ml 36.12 ml/m  RA Volume:   25.10 ml  11.69 ml/m LA Vol (A4C):   47.6 ml 22.15 ml/m LA Biplane Vol: 70.1 ml 32.65 ml/m  AORTIC VALVE LVOT Vmax:   93.30 cm/s LVOT Vmean:  73.000 cm/s LVOT VTI:    0.179 m  AORTA Ao Root diam: 3.40 cm Ao Asc diam:  3.40 cm MITRAL VALVE MV Area (PHT): 2.92 cm    SHUNTS MV Decel Time: 260 msec    Systemic VTI:  0.18 m MV E velocity: 50.70 cm/s  Systemic Diam: 2.10 cm MV A velocity: 68.10 cm/s MV E/A ratio:  0.74 Skeet Latch MD Electronically signed by Skeet Latch MD Signature Date/Time: 04/26/2022/10:07:39 AM    Final    DG Chest 2 View  Result Date: 04/24/2022 CLINICAL DATA:  Intermittent left chest pain for 2 weeks. Worse at night. Prior triple bypass. Prior coronary stenting. EXAM: CHEST - 2 VIEW COMPARISON:  Chest 03/08/2021 FINDINGS: Old CABG. Heart size and vascular pattern are normal. There is calcification of  the transverse aorta with normal mediastinal configuration. The lungs are clear with chronic moderate elevation right hemidiaphragm. The sulci are sharp. Osteopenia and mild thoracic kyphosis. Multilevel thoracic spine bridging enthesopathy. No acute spinal compression fracture. IMPRESSION: No active cardiopulmonary disease. Chronic moderate elevation of the right hemidiaphragm. Old CABG. Aortic atherosclerosis. Electronically Signed   By: Telford Nab M.D.   On: 04/24/2022 22:40   Disposition   Pt is being discharged home today in good condition.  Follow-up Plans & Appointments     Follow-up Information     Lenna Sciara, NP Follow up on 05/06/2022.   Specialties: Nurse Practitioner, Family Medicine Why: at 8:25am for your follow up appt with Dr. Darcus Pester' NP Maris Berger information: [address] [place] [address] [phone]                Discharge Instructions     AMB Referral to Advanced Lipid Disorders Clinic   Complete by: As directed    Internal Lipid Clinic Referral Scheduling  Internal lipid clinic referrals are providers within [facility], who wish to refer established patients for routine management (help in starting PCSK9 inhibitor therapy) or advanced therapies.  Internal MD referral criteria:              1. All patients with LDL>190 mg/dL  2. All patients with Triglycerides >500 mg/dL  3. Patients with suspected or confirmed heterozygous familial hyperlipidemia (HeFH) or homozygous familial hyperlipidemia (HoFH)  4. Patients with family history of suspicious for genetic  dyslipidemia desiring genetic testing  5. Patients refractory to standard guideline based therapy  6. Patients with statin intolerance (failed 2 statins, one of which must be a high potency statin)  7. Patients who the provider desires to be seen by MD   Internal PharmD referral criteria:   1. Follow-up patients for medication management  2. Follow-up for compliance monitoring  3. Patients for drug education  4. Patients with statin intolerance  5. PCSK9 inhibitor education and prior authorization approvals  6. Patients with triglycerides <500 mg/dL  External Lipid Clinic Referral  External lipid clinic referrals are for providers outside of [facility], considered new clinic patients - automatically routed to MD schedule   Amb Referral to Cardiac Rehabilitation   Complete by: As directed    Diagnosis: Coronary Stents   After initial evaluation and assessments completed: Virtual Based Care may be provided alone or in conjunction with Phase 2 Cardiac Rehab based on patient barriers.: Yes   Intensive Cardiac Rehabilitation (ICR) [place] location only OR Traditional Cardiac Rehabilitation (TCR) *If criteria for ICR are not met will enroll in TCR [facility] only): Yes   Call MD for:  difficulty breathing, headache or visual disturbances   Complete by: As directed    Call MD for:  persistant dizziness or light-headedness   Complete by: As directed    Call MD for:  redness, tenderness, or signs of infection (pain, swelling, redness, odor or green/yellow discharge around incision site)   Complete by: As directed    Diet - low sodium heart healthy   Complete by: As directed    Discharge instructions   Complete by: As directed    Radial Site Care Refer to this sheet in the next few weeks. These instructions provide you with information on caring for yourself after your procedure. Your caregiver may also give you more specific instructions. Your treatment has been planned according to current  medical practices, but problems sometimes occur. Call your caregiver  if you have any problems or questions after your procedure. HOME CARE INSTRUCTIONS You may shower the day after the procedure. Remove the bandage (dressing) and gently wash the site with plain soap and water. Gently pat the site dry.  Do not apply powder or lotion to the site.  Do not submerge the affected site in water for 3 to 5 days.  Inspect the site at least twice daily.  Do not flex or bend the affected arm for 24 hours.  No lifting over 5 pounds (2.3 kg) for 5 days after your procedure.  Do not drive home if you are discharged the same day of the procedure. Have someone else drive you.  You may drive 24 hours after the procedure unless otherwise instructed by your caregiver.  What to expect: Any bruising will usually fade within 1 to 2 weeks.  Blood that collects in the tissue (hematoma) may be painful to the touch. It should usually decrease in size and tenderness within 1 to 2 weeks.  SEEK IMMEDIATE MEDICAL CARE IF: You have unusual pain at the radial site.  You have redness, warmth, swelling, or pain at the radial site.  You have drainage (other than a small amount of blood on the dressing).  You have chills.  You have a fever or persistent symptoms for more than 72 hours.  You have a fever and your symptoms suddenly get worse.  Your arm becomes pale, cool, tingly, or numb.  You have heavy bleeding from the site. Hold pressure on the site.   PLEASE DO NOT MISS ANY DOSES OF YOUR PLAVIX!!!!! Also keep a log of you blood pressures and bring back to your follow up appt. Please call the office with any questions.   Patients taking blood thinners should generally stay away from medicines like ibuprofen, Advil, Motrin, naproxen, and Aleve due to risk of stomach bleeding. You may take Tylenol as directed or talk to your primary doctor about alternatives.  PLEASE ENSURE THAT YOU DO NOT RUN OUT OF YOUR PLAVIX. This  medication is very important to remain on for at least one year. IF you have issues obtaining this medication due to cost please CALL the office 3-5 business days prior to running out in order to prevent missing doses of this medication.   Increase activity slowly   Complete by: As directed         Discharge Medications   Allergies as of 04/27/2022       Reactions   Penicillins Swelling, Other (See Comments)   Swelling and redness in joints Has patient had a PCN reaction causing immediate rash, facial/tongue/throat swelling, SOB or lightheadedness with hypotension: No Has patient had a PCN reaction causing severe rash involving mucus membranes or skin necrosis: No Has patient had a PCN reaction that required hospitalization: No Has patient had a PCN reaction occurring within the last 10 years: No If all of the above answers are "NO", then may proceed with Cephalosporin use.   Sulfa Antibiotics Other (See Comments)   Pain in left side underneath ribcage-pancreas   Statins Other (See Comments)   UNSPECIFIED REACTION  "INTOLERANCE AT HIGH DOSES"        Medication List     STOP taking these medications    amLODipine 5 MG tablet Commonly known as: NORVASC   lisinopril 10 MG tablet Commonly known as: ZESTRIL       TAKE these medications    acetaminophen 500 MG tablet Commonly known as: TYLENOL Take  1,000 mg by mouth every 8 (eight) hours as needed for mild pain.   aspirin EC 81 MG tablet Take 1 tablet (81 mg total) by mouth daily. Swallow whole.   azelastine 0.1 % nasal spray Commonly known as: ASTELIN Place 1-2 sprays into both nostrils 2 (two) times daily. What changed:  when to take this reasons to take this   cholecalciferol 1000 units tablet Commonly known as: VITAMIN D Take 1,000 Units by mouth daily.   clindamycin 300 MG capsule Commonly known as: CLEOCIN Take 600 mg by mouth 2 (two) times daily.   clopidogrel 75 MG tablet Commonly known as:  PLAVIX TAKE 1 TABLET(75 MG) BY MOUTH DAILY   cyanocobalamin 1000 MCG tablet Commonly known as: VITAMIN B12 Take 1,000 mcg by mouth 2 (two) times daily.   irbesartan 300 MG tablet Commonly known as: AVAPRO Take 1 tablet (300 mg total) by mouth daily.   metoprolol succinate 25 MG 24 hr tablet Commonly known as: TOPROL-XL Take 3 tablets (75 mg total) by mouth 2 (two) times daily. Take with or immediately following a meal. What changed:  medication strength See the new instructions.   montelukast 10 MG tablet Commonly known as: SINGULAIR TAKE 1 TABLET(10 MG) BY MOUTH AT BEDTIME What changed: See the new instructions.   multivitamin with minerals Tabs tablet Take 1 tablet by mouth daily.   nitroGLYCERIN 0.4 MG SL tablet Commonly known as: NITROSTAT PLACE 1 TABLET UNDER THE TONGUE EVERY 5 MINUTES AS NEEDED FOR CHEST PAIN FOR 3 DOSES. IF NOT RELIEF AFTER FIRST DOSE, CALL PRESCRIBER OR 911. What changed:  how much to take how to take this when to take this reasons to take this   tamsulosin 0.4 MG Caps capsule Commonly known as: FLOMAX Take 0.4 mg by mouth at bedtime.   traMADol 50 MG tablet Commonly known as: ULTRAM Take 100 mg by mouth 2 (two) times daily.   triamcinolone 55 MCG/ACT Aero nasal inhaler Commonly known as: NASACORT Place 1 spray into the nose daily. What changed:  when to take this reasons to take this   vitamin C 250 MG tablet Commonly known as: ASCORBIC ACID Take 250 mg by mouth daily.   zolpidem 10 MG tablet Commonly known as: AMBIEN Take 10 mg by mouth at bedtime as needed for sleep.           Outstanding Labs/Studies   Outpatient lipid clinic referral at [place]  Duration of Discharge Encounter   Greater than 30 minutes including physician time.  Signed, Reino Bellis, NP 04/27/2022, 11:29 AM

## 2022-04-27 NOTE — Progress Notes (Signed)
CARDIAC REHAB PHASE I   PRE:  Rate/Rhythm: 101 ST  BP:  Sitting: 144/63      SaO2: 98% RA  MODE:  Ambulation: 260 ft   POST:  Rate/Rhythm: 112 ST  BP:  Sitting: 133/64      SaO2: 98% RA  Pt ambulated 277ft with cane requiring contact guard assistance d/t unsteadiness. Pt denies CP, reports minimal fatigue, 5-6 on Borg Scale. Pt was educated on stent location, wt restrictions, asa and plavix use, s/s of infection, no baths/daily wash-ups, ex guidelines (progressive walking, light RT) s/s to stop exercising, ntg use and calling 911, risk factors (LDL), and CRPII.Pt will be referred to [facility].   Faustino Congress  8:52 AM 04/27/2022    [id number]

## 2022-05-05 NOTE — Progress Notes (Unsigned)
Office Visit    Patient Name: Daniel [name] Date of Encounter: 05/06/2022  Primary Care Provider:  Geoffry Paradise, MD Primary Cardiologist:  Bryan Lemma, MD  Chief Complaint    88 year old male with a history of CAD s/p CABG x3 in 1997 (LIMA-LAD, SVG-diagonal, SVG-OM), s/p PCI-SVG-OM 2 in 04/2022, labile hypertension, hyperlipidemia, palpitations, and retinal vein occlusion who presents for hospital follow-up related to CAD.  Past Medical History    Past Medical History:  Diagnosis Date   Arthritis    CAD of autologous arterial graft 2010   Atretic LIMA; Myoview Jan 2015: Possible mild basal anteroseptal defect thought to be artifact (CRO early LAD ischemia due to inadeuate retrograde perfusion via SVG-Diag   CAD S/P percutaneous coronary angioplasty 11/2006   a. 6/'08: PCI nongrafted distal RCA-PDA: Mini vision BMS 2.25 mm x 28 mm; b. Relook Cath 08/2015: Stable CAD. No culprit lesion. Widely patent PDA stent. Patent SVG-OM 2, SVG-D2. Essentially atretic/small LIMA-LAD, but the LAD is perfused via SVG-D2   Complication of anesthesia    Coronary artery disease involving left main coronary artery [id number]   Last cath 2010: 70-80 % ostial left main, 80-90% LAD, subtotal CTO LCx-OM2, RCA patent w/ patent BMS stent (dRCA-PDA); SVG-diagonal backfills LAD, atretic LIMA-LAD -- medical therapy; stable by relook cath in March 2017   Difficult intubation    VERY NARROW AIRWAY PER ANESTHESIOLOGIST (1997 CABG AT [facility])   Dyslipidemia, goal LDL below 70    Dysrhythmia    Heart palpitations    Never fully diagnosed.   History of kidney stones    Hypertension    Insomnia    Mass in chest    JUST WATCHING , WILL CHECK OUT AFTER SURGERY   Pneumonia    S/P CABG x 3 1997   LIMA-LAD, SVG-D2, SVG-OM --> LIMA known to be atretic, but SVG-D2 fills diagonal and LAD   Stenosis of cervical spine with myelopathy (HCC)    Tingling    in fingers   Past Surgical History:  Procedure Laterality Date    48-HOUR MONITOR  03/2017   Relatively normal.  Very infrequent ectopy.  Short PAT run with occasional PACs/PVCs and bigeminy.  No symptoms noted on monitor.  Longest PAT runs were less than 10 beats.   BACK SURGERY     decompression  lower back    CARDIAC CATHETERIZATION N/A 08/08/2015   Procedure: Left Heart Cath and Cors/Grafts Angiography;  Surgeon: Marykay Lex, MD;  Location: [facility] INVASIVE CV LAB;; -->  LM 80%-70%. Ost LAD 80% then prox-mid 100% (after small D1). Small LCx & OM2 patent with 100% OM2. Prox & distal RCA 20%, patent BMS in distal RCA-rPDA.  Patent/atretic LIMA-dLAD, SVG-D2 (backdills entire dLAD) & SVG-OM2.    COLONOSCOPY     CORONARY ANGIOPLASTY WITH STENT PLACEMENT  Ers[facility] Squibb 2008   BMS PCI of distal RCA-RPDA: PCI with a 2.25 mm x 28 mm Mini Vision bare-metal stent.   CORONARY ARTERY BYPASS GRAFT  [id number]   LIMA - LAD, SVG- diagonal, SVG-OM   CORONARY STENT INTERVENTION N/A 04/26/2022   Procedure: CORONARY STENT INTERVENTION;  Surgeon: Swaziland, Peter M, MD;  Location: [facility] INVASIVE CV LAB;  Service: Cardiovascular;  Laterality: N/A;   EYE SURGERY     BIL CATARACT   KIDNEY STONE SURGERY     KNEE ARTHROSCOPY     RT  KNEE   LEFT HEART CATH AND CORS/GRAFTS ANGIOGRAPHY  11/2006   High-grade distal RCA and PDA stenosis -->  BMS PCI.    LEFT HEART CATH AND CORS/GRAFTS ANGIOGRAPHY  2010   Native coronaries at 70% to 80% ostial left main. LAD had diffuse 80% to 90% stenosis   LEFT HEART CATH AND CORS/GRAFTS ANGIOGRAPHY N/A 04/26/2022   Procedure: LEFT HEART CATH AND CORS/GRAFTS ANGIOGRAPHY;  Surgeon: Swaziland, Peter M, MD;  Location: [facility] INVASIVE CV LAB;  Service: Cardiovascular;  Laterality: N/A;   NM MYOVIEW LTD  02/25/2004; Jan 2015   a) EF 57%; b)  Normal Nuclear Stress Test - No evidence of Ischemia or Infarction. Normal LV Function & wall motion.   NM MYOVIEW LTD  03/17/2017   No significant reversible ischemia. Increased TID ratio 1.34. LVEF 54% with normal wall motion. This is a  low risk study.   POSTERIOR CERVICAL FUSION/FORAMINOTOMY N/A 07/14/2017   Procedure: LAMINECTOMY CERVICAL THREE- CERVICAL FOUR WITH LATERAL MASS FUSION;  Surgeon: Lisbeth Renshaw, MD;  Location: [facility] OR;  Service: Neurosurgery;  Laterality: N/A;  LAMINECTOMY CERVICAL 3- CERVICAL 4 WITH LATERAL MASS FUSION   TOTAL KNEE ARTHROPLASTY Right 04/07/2020   Procedure: TOTAL KNEE ARTHROPLASTY;  Surgeon: Dannielle Huh, MD;  Location: [facility] ORS;  Service: Orthopedics;  Laterality: Right;    Allergies  Allergies  Allergen Reactions   Penicillins Swelling and Other (See Comments)    Swelling and redness in joints  Has patient had a PCN reaction causing immediate rash, facial/tongue/throat swelling, SOB or lightheadedness with hypotension: No Has patient had a PCN reaction causing severe rash involving mucus membranes or skin necrosis: No Has patient had a PCN reaction that required hospitalization: No Has patient had a PCN reaction occurring within the last 10 years: No If all of the above answers are "NO", then may proceed with Cephalosporin use.    Sulfa Antibiotics Other (See Comments)    Pain in left side underneath ribcage-pancreas   Statins Other (See Comments)    UNSPECIFIED REACTION  "INTOLERANCE AT HIGH DOSES"    History of Present Illness    88 year old male with the above past medical history including CAD s/p CABG x3 in 1997 (LIMA-LAD, SVG-diagonal, SVG-OM), s/p DES-SVG-OM 2 in 04/2022, labile hypertension, hyperlipidemia, palpitations, and retinal vein occlusion.  Cardiac catheterization in 2017 showed atretic LIMA with SVG-D2 filling the LAD with retrograde flow with patent SVG-OM and patent stent and PDA.  He was last seen in the office on 10/27/2021 and was stable from a cardiac standpoint.  He denied symptoms concerning for angina.  He did note chronic fatigue.  He has a history of statin intolerance and has been unwilling to try alternative therapies.  He presented to the ED on  04/25/2022 in the setting of progressive chest pain, shortness of breath, concerning for angina.  Cardiology was consulted.  Troponin was flat.  However, given symptoms he underwent cardiac catheterization which revealed two-vessel occlusive CAD (LAD and RCA), patent stent in PDA, patent LIMA-LAD, patent SVG-second diagonal, patent SVG-OM 2 but with critical stenosis in the mid SVG s/p DES.  He did have transient no reflow that resolved with IC verapamil.  Echocardiogram showed EF 60 to 65%, normal LV function, no RWMA, mild concentric LVH, G1 DD, normal RV systolic function, no significant valvular abnormalities.  His BP was somewhat elevated.  Metoprolol was increased to 75 mg twice daily.  Additionally, he was transitioned from lisinopril to irbesartan. He agreed to lipid clinic referral for discussion of injectable lipid-lowering therapy.  He was discharged home in stable condition 04/27/2022.  He presents today for follow-up.  Since his hospitalization he has been stable overall from a cardiac standpoint.  He does note occasional fleeting chest discomfort, generalized fatigue, though this is much improved since his discharge from the hospital.  Denies dyspnea.  He is concerned that he has noted elevated BP readings at home.   Home Medications    Current Outpatient Medications  Medication Sig Dispense Refill   acetaminophen (TYLENOL) 500 MG tablet Take 1,000 mg by mouth every 8 (eight) hours as needed for mild pain.     aspirin EC 81 MG tablet Take 1 tablet (81 mg total) by mouth daily. Swallow whole. 90 tablet 2   azelastine (ASTELIN) 0.1 % nasal spray Place 1-2 sprays into both nostrils 2 (two) times daily. (Patient taking differently: Place 1-2 sprays into both nostrils daily as needed for allergies.) 30 mL 2   cholecalciferol (VITAMIN D) 1000 units tablet Take 1,000 Units by mouth daily.     clindamycin (CLEOCIN) 300 MG capsule Take 600 mg by mouth 2 (two) times daily.     clopidogrel (PLAVIX)  75 MG tablet TAKE 1 TABLET(75 MG) BY MOUTH DAILY 90 tablet 3   irbesartan (AVAPRO) 300 MG tablet Take 1 tablet (300 mg total) by mouth daily. 90 tablet 1   isosorbide mononitrate (IMDUR) 30 MG 24 hr tablet Take 0.5 tablets (15 mg total) by mouth daily. 30 tablet 3   loratadine (CLARITIN) 10 MG tablet Take 10 mg by mouth daily.     metoprolol succinate (TOPROL-XL) 25 MG 24 hr tablet Take 3 tablets (75 mg total) by mouth 2 (two) times daily. Take with or immediately following a meal. 180 tablet 2   montelukast (SINGULAIR) 10 MG tablet TAKE 1 TABLET(10 MG) BY MOUTH AT BEDTIME (Patient taking differently: Take 10 mg by mouth at bedtime.) 90 tablet 0   Multiple Vitamin (MULTIVITAMIN WITH MINERALS) TABS tablet Take 1 tablet by mouth daily.     tamsulosin (FLOMAX) 0.4 MG CAPS capsule Take 0.4 mg by mouth at bedtime.   0   traMADol (ULTRAM) 50 MG tablet Take 100 mg by mouth 2 (two) times daily.     triamcinolone (NASACORT) 55 MCG/ACT AERO nasal inhaler Place 1 spray into the nose daily. (Patient taking differently: Place 1 spray into the nose daily as needed (allergies).) 16.5 g 5   vitamin B-12 (CYANOCOBALAMIN) 1000 MCG tablet Take 1,000 mcg by mouth 2 (two) times daily.      vitamin C (ASCORBIC ACID) 250 MG tablet Take 250 mg by mouth daily.     zolpidem (AMBIEN) 10 MG tablet Take 10 mg by mouth at bedtime as needed for sleep.     nitroGLYCERIN (NITROSTAT) 0.4 MG SL tablet PLACE 1 TABLET UNDER THE TONGUE EVERY 5 MINUTES AS NEEDED FOR CHEST PAIN FOR 3 DOSES. IF NOT RELIEF AFTER FIRST DOSE, CALL PRESCRIBER OR 911. 25 tablet 3   No current facility-administered medications for this visit.     Review of Systems    He denies palpitations, dyspnea, pnd, orthopnea, n, v, dizziness, syncope, edema, weight gain, or early satiety. All other systems reviewed and are otherwise negative except as noted above.    Cardiac Rehabilitation Eligibility Assessment  The patient is ready to start cardiac rehabilitation  from a cardiac standpoint.    Physical Exam    VS:  BP 130/72   Pulse 65   Ht 6\' 3"  (1.905 m)   Wt 197 lb (89.4 kg)   BMI 24.62 kg/m     GEN: Well  nourished, well developed, in no acute distress. HEENT: normal. Neck: Supple, no JVD, carotid bruits, or masses. Cardiac: RRR, no murmurs, rubs, or gallops. No clubbing, cyanosis, edema.  Radials/DP/PT 2+ and equal bilaterally.  Left radial cath site without bruising, bleeding, or hematoma. Respiratory:  Respirations regular and unlabored, clear to auscultation bilaterally. GI: Soft, nontender, nondistended, BS + x 4. MS: no deformity or atrophy. Skin: warm and dry, no rash. Neuro:  Strength and sensation are intact. Psych: Normal affect.  Accessory Clinical Findings    ECG personally reviewed by me today - NSR, 66 bpm  - no acute changes.   Lab Results  Component Value Date   WBC 8.2 04/27/2022   HGB 12.0 (L) 04/27/2022   HCT 34.8 (L) 04/27/2022   MCV 89.9 04/27/2022   PLT 212 04/27/2022   Lab Results  Component Value Date   CREATININE 1.01 04/27/2022   BUN 20 04/27/2022   NA 139 04/27/2022   K 3.6 04/27/2022   CL 108 04/27/2022   CO2 22 04/27/2022   Lab Results  Component Value Date   ALT 18 03/08/2021   AST 20 03/08/2021   ALKPHOS 72 03/08/2021   BILITOT 0.7 03/08/2021   Lab Results  Component Value Date   CHOL 156 04/26/2022   HDL 34 (L) 04/26/2022   LDLCALC 98 04/26/2022   TRIG 119 04/26/2022   CHOLHDL 4.6 04/26/2022    Lab Results  Component Value Date   HGBA1C 5.6 04/27/2022    Assessment & Plan   1. CAD: S/p CABG x 3 in 1997, DES-SVG-OM 2 in 04/2022.  He notes mild intermittent chest discomfort.  He was previously on Imdur.  Will restart Imdur 15 mg daily to see if this helps with his symptoms.  Continue aspirin, Plavix, irbesartan, metoprolol.  Statin intolerant. Lipid clinic referral pending as below.   2. Hypertension: BP well controlled.  He has noted elevated BP readings at home.  Recommend he  purchase new BP cuff and bring to his next follow-up visit.  Start Imdur as above.  Continue to monitor BP and report BP consistently greater than 130/80.  Otherwise, continue current antihypertensive regimen.   3. Hyperlipidemia: LDL was 98 in 04/2022.  History of statin intolerance.  He has been referred to our lipid clinic to discuss alternative lipid-lowering therapies.  4. Disposition: Follow-up in 1 month.      Joylene Grapes, NP 05/06/2022, 12:05 PM

## 2022-05-06 ENCOUNTER — Ambulatory Visit: Payer: Medicare Other | Attending: Nurse Practitioner | Admitting: Nurse Practitioner

## 2022-05-06 ENCOUNTER — Encounter: Payer: Self-pay | Admitting: Nurse Practitioner

## 2022-05-06 VITALS — BP 130/72 | HR 65 | Ht 75.0 in | Wt 197.0 lb

## 2022-05-06 DIAGNOSIS — Z951 Presence of aortocoronary bypass graft: Secondary | ICD-10-CM | POA: Diagnosis not present

## 2022-05-06 DIAGNOSIS — I10 Essential (primary) hypertension: Secondary | ICD-10-CM | POA: Diagnosis not present

## 2022-05-06 DIAGNOSIS — E785 Hyperlipidemia, unspecified: Secondary | ICD-10-CM

## 2022-05-06 DIAGNOSIS — Z789 Other specified health status: Secondary | ICD-10-CM

## 2022-05-06 DIAGNOSIS — I25118 Atherosclerotic heart disease of native coronary artery with other forms of angina pectoris: Secondary | ICD-10-CM

## 2022-05-06 MED ORDER — NITROGLYCERIN 0.4 MG SL SUBL: SUBLINGUAL_TABLET | SUBLINGUAL | 3 refills | Status: DC

## 2022-05-06 MED ORDER — ISOSORBIDE MONONITRATE ER 30 MG PO TB24: 15.0000 mg | ORAL_TABLET | Freq: Every day | ORAL | 3 refills | Status: DC

## 2022-05-06 NOTE — Patient Instructions (Addendum)
Medication Instructions:  Start Imdur 15 mg daily  *If you need a refill on your cardiac medications before your next appointment, please call your pharmacy*   Lab Work: NONE ordered at this time of appointment   If you have labs (blood work) drawn today and your tests are completely normal, you will receive your results only by: MyChart Message (if you have MyChart) OR A paper copy in the mail If you have any lab test that is abnormal or we need to change your treatment, we will call you to review the results.   Testing/Procedures: NONE ordered at this time of appointment     Follow-Up: At [facility], you and your health needs are our priority.  As part of our continuing mission to provide you with exceptional heart care, we have created designated Provider Care Teams.  These Care Teams include your primary Cardiologist (physician) and Advanced Practice Providers (APPs -  Physician Assistants and Nurse Practitioners) who all work together to provide you with the care you need, when you need it.  We recommend signing up for the patient portal called "MyChart".  Sign up information is provided on this After Visit Summary.  MyChart is used to connect with patients for Virtual Visits (Telemedicine).  Patients are able to view lab/test results, encounter notes, upcoming appointments, etc.  Non-urgent messages can be sent to your provider as well.   To learn more about what you can do with MyChart, go to [email or web address].    Your next appointment:   1 month(s)  The format for your next appointment:   In Person  Provider:   Bernadene Person, NP        Other Instructions Omron blood pressure cuff. Monitor blood pressure. Report blood pressure consistently great than 130/80. Referral sent to pharm-D  Important Information About Sugar

## 2022-05-10 NOTE — Addendum Note (Signed)
Addended by: Maryjean Ka A on: 05/10/2022 09:49 AM   Modules accepted: Orders

## 2022-05-13 ENCOUNTER — Telehealth (HOSPITAL_COMMUNITY): Payer: Self-pay

## 2022-05-13 NOTE — Telephone Encounter (Signed)
Attempted to call patient in regards to Cardiac Rehab - LM on VM 

## 2022-06-03 ENCOUNTER — Ambulatory Visit: Payer: Medicare Other | Attending: Cardiology | Admitting: Cardiology

## 2022-06-03 VITALS — BP 140/72 | HR 66 | Ht 75.0 in | Wt 202.0 lb

## 2022-06-03 DIAGNOSIS — M791 Myalgia, unspecified site: Secondary | ICD-10-CM | POA: Diagnosis not present

## 2022-06-03 DIAGNOSIS — I200 Unstable angina: Secondary | ICD-10-CM | POA: Diagnosis not present

## 2022-06-03 DIAGNOSIS — I10 Essential (primary) hypertension: Secondary | ICD-10-CM

## 2022-06-03 DIAGNOSIS — E785 Hyperlipidemia, unspecified: Secondary | ICD-10-CM | POA: Diagnosis not present

## 2022-06-03 DIAGNOSIS — I951 Orthostatic hypotension: Secondary | ICD-10-CM

## 2022-06-03 DIAGNOSIS — I25118 Atherosclerotic heart disease of native coronary artery with other forms of angina pectoris: Secondary | ICD-10-CM | POA: Diagnosis not present

## 2022-06-03 DIAGNOSIS — R5383 Other fatigue: Secondary | ICD-10-CM

## 2022-06-03 DIAGNOSIS — T466X5A Adverse effect of antihyperlipidemic and antiarteriosclerotic drugs, initial encounter: Secondary | ICD-10-CM

## 2022-06-03 MED ORDER — CHLORTHALIDONE 25 MG PO TABS: 12.5000 mg | ORAL_TABLET | Freq: Every day | ORAL | 11 refills | Status: DC

## 2022-06-03 NOTE — Patient Instructions (Signed)
Medication Instructions:   Chlorthalidone 12.5 mg  (1/2 tablet of 25 mg)   daily    *If you need a refill on your cardiac medications before your next appointment, please call your pharmacy*   Lab Work:fasting  Lipid cmp If you have labs (blood work) drawn today and your tests are completely normal, you will receive your results only by: MyChart Message (if you have MyChart) OR A paper copy in the mail If you have any lab test that is abnormal or we need to change your treatment, we will call you to review the results.   Testing/Procedures: Not needed   Follow-Up: At [facility], you and your health needs are our priority.  As part of our continuing mission to provide you with exceptional heart care, we have created designated Provider Care Teams.  These Care Teams include your primary Cardiologist (physician) and Advanced Practice Providers (APPs -  Physician Assistants and Nurse Practitioners) who all work together to provide you with the care you need, when you need it.     Your next appointment:   4 month(s)  The format for your next appointment:   In Person  Provider:   Bryan Lemma, MD    Other Instructions   Keep appointment with [facility] - Jan 3 ,2024 -- follow blood pressure,  lipids

## 2022-06-03 NOTE — Progress Notes (Signed)
Primary Care Provider: Geoffry ParadiseAronson, Richard, MD [facility] Cardiologist: Bryan Lemmaavid Esaias Cleavenger, MD Electrophysiologist: None  Clinic Note: Chief Complaint  Patient presents with   Follow-up    Concerns about labile hypertension.  Blood pressures elevated with pressure log with reviewed.   Coronary Artery Disease    No further chest pain after recent PCI.    ===================================  ASSESSMENT/PLAN   Problem List Items Addressed This Visit       Cardiology Problems   Unstable angina (HCC)    No further unstable angina following PCI.  I think can DC Imdur and continue current dose of Toprol.  If additional blood pressure room is present, would consider amlodipine.      Relevant Medications   chlorthalidone (HYGROTON) 25 MG tablet   Other Relevant Orders   EKG 12-Lead   Lipid panel   Comprehensive metabolic panel   LM-CAD:  LIMA-LAD, patent SVG-D2 & SVG-OM2 (DES PCI) , BMS PCI rPDA - Primary (Chronic)    No further angina after his most recent PCI.  I think he had a likely type IV MI with significant chest pain after no reflow post PCI.  There likely was distal embolization of thrombus.  The lesion was too tight to use distal protection device.  Now he is chest pain free we just need to control his blood pressure better.  With him no longer having chest pain, we can discontinue the Imdur.  Would preferentially use amlodipine if necessary for blood pressure and antianginal benefit. He is on high-dose Toprol 75 mg twice daily along with high-dose Avapro 300 mg daily with still inadequately controlled blood pressure. Has had issues with statin.  Referring to lipid clinic.  (Prior to his MI, he did not seem interested in PCSK9 inhibitor injections.  Now he would be willing to try.) On maintenance dose aspirin and Plavix DAPT x 1 year.       Relevant Medications   chlorthalidone (HYGROTON) 25 MG tablet   Other Relevant Orders   Lipid panel   Comprehensive  metabolic panel   Orthostatic hypotension (Chronic)    He has had orthostatic hypotension and is recently as this past summer.  Need to be really careful with being overly aggressive when treating his BP. Allow for permissive hypertension-blood pressure range that would be except would be 130s to 150s systolic.      Relevant Medications   chlorthalidone (HYGROTON) 25 MG tablet   Labile essential hypertension (Chronic)    Blood pressure is actually close to normal today, but is on the low end of his BP range at home.  He does have elevated diastolic pressures at home in the 80s and 90s to the low 100s.  As such, would like to add some afterload reduction and volume removal.  Add chlorthalidone 12.5 mg daily.  Check labs next week when he sees the lipid clinic.  Would asked that the clinical pharmacy team reassess blood pressures and potentially titrate further if renal function and potassium will tolerate      Relevant Medications   chlorthalidone (HYGROTON) 25 MG tablet   Dyslipidemia, goal LDL below 70 (Chronic)    States he was doing well and was having issues with fatigue, we had decided that we would allow him to go without therapy, but now he has had another episode of unstable angina with PCI he is interested in pursuing nonstatin therapy.  He was not interested in trying Nexletol or Nexlizet in the past.  He had not  wanted to do injections, but now is willing to give it consideration.  He has been referred to [facility] lipid clinic.  Due to be seen next week on January 3.  This will be for discussion of lipid management as well as blood pressure follow-up.      Relevant Medications   chlorthalidone (HYGROTON) 25 MG tablet   Other Relevant Orders   Lipid panel     Other   Fatigue due to treatment (Chronic)    He has had continuous issues with fatigue.  One of the reason why would like to avoid being overly aggressive with treating his hypertension.  I had reduced his Toprol in the past,  but clearly this was not noted while he was in the hospital.  I would probably prefer to use additional BP meds as opposed to titrating up his Toprol.  I anticipate backing back down to 100 mg a day at the most of Toprol.  However, until his blood pressures are more stable, we will continue with current meds.      Myalgia due to statin (Chronic)    Has tried multiple statins in the past, intolerant of just about all of them as well as Zetia.  Has significant myalgias and fatigue.      Relevant Orders   EKG 12-Lead    ===================================  HPI:    Daniel [name] is a 88 y.o. male with a PMH with longstanding history of CAD, labile HTN, HLD who presents today for 1 month follow-up for BP evaluation at the request of Geoffry Paradise, MD.  CAD s/p CABG x3 in 1997  for MV-LM CAD: LIMA-LAD, SVG-Diag, SVG-OM,  11/2006 -admitted for CHF symptoms: PTCA of rPDA Cath 2017: ~ Atretic LIMA-LAD (LAD fills via SVG-Diag, SVG-OM & rPDA Stents patent 04/2022: Unstable Angina => PCI SVG-OM1 (with ~Type IVa MI - no reflow with stent deployment - resorted TIMI 3 flow with IV Adenosine Labile hypertension, hyperlipidemia, palpitations, and  retinal vein occlusion   I last saw Daniel [name] back in May 2023: He was doing fairly well.  Had an episode about 3 months prior to that with elevated blood pressure over the 200s he was told to increase his lisinopril back to 10 mg twice a day, and it was recently reduced back down to 10 mg once a day with 5 mg additional for PRN.  24-hour BP monitoring assess.  Was placed on amlodipine that was subsequently discontinued.  BP was doing much better follow-up.  Was having issues with balance being off.  May be getting tired and worn out little earlier than usual.  Somewhat labile blood sugars but better.  No longer on amlodipine.  Relatively asymptomatic.  Recent Hospitalizations:  ED on 11/19-21/2023-presented with symptoms concerning for unstable angina.   (After 12 and half hours in the waiting area, was brought back to her room, cardiology was consulted. Troponin was flat.  But with ACS presentation, underwent cardiac catheterization  Cath: severe SVG_OM2 lesion => DES PCI, complicated by no reflow initially after stent placement requiring IC NTG and IC verapamil, restoring TIMI-3 flow.  Echocardiogram: Essentially normal His BP was somewhat elevated. Metoprolol was increased to 75 mg twice daily, & transitioned from lisinopril to irbesartan.  LDL 98.  He agreed to lipid clinic referral for discussion of injectable lipid-lowering therapy. He was discharged home in stable condition 04/27/2022.   Daniel [name] was last seen on November 30 by Bernadene Person, NP for hospital follow-up.  Doing well,  stable Makari standpoint.  Noting occasional fleeting chest discomfort but generally feeling better.  Generalized fatigue but improved since discharge.  No dyspnea.  Noted elevated blood pressure readings at home.  => Recommended purchasing a new cuff. => Plan was to reassess blood pressures at home.  Referred to lipid clinic to discuss alternative therapies for hyperlipidemia.   Reviewed  CV studies:    The following studies were reviewed today: (if available, images/films reviewed: From Epic Chart or Care Everywhere)  Echo 04/26/2022:  EF 60 to 65%, normal LV function, no RWMA, mild concentric LVH, G1 DD, normal RV systolic function, no significant valvular abnormalities.   Cardiac Cath-PCI  04/26/2022: 2 V Occlusive Native CAD: 60% LM, 70% ostial-prox LAD, 100% mid LAD, 1 100% prox-mid LCx; 40% distRCA with patent PDA stent.  Patent LIMA-LAD, SVG-D2. 95% mid SVG-OM2 => DES PCI (Synergy XD DES 4.0 mm x 16 mm) -> complicated by transient no reflow resolved with IC verapamil  Diagnostic: Dominance: Right      Intervention   Interval History:   Daniel [name] returns here today overall doing pretty well from a cardiac standpoint following his PCI.  He said  he had 2 to 3 days of chest discomfort after, but has not had any symptoms since then.  He had lots of angina during the PCI.  What he noted was leading up to his presentation that he is starting to have postprandial anginal symptoms.  He is no longer having any of that.  He just notes that he still has overall decreased energy and easy fatigability.  He really does not describe his exertional dyspnea just just fatigue.  Just not as much energy.  He is walking more frequently as best he can with his knee.  The right knee is recovering with surgery but is still stiff but the left knee is very painful along with his back and hips.  He denies any PND, with apnea or edema.  No irregular heartbeats or palpitation.  No syncope or near syncope.  No TIA or amaurosis fugax.  No claudication.  What has been noticing is that his blood pressures been getting up higher.  He was discharged on Avapro 300 mg daily along with Toprol 75 mg twice daily.  He is not on diuretic but is on low-dose Imdur.  He brings with him his blood pressure readings starting back from November 27: Pressures were anywhere from 137/79 as an outlier with averages in the 150s/ 80s, but highest in the 193/91 range.  More recently, his pressures have been in slightly lower but still higher than expected.  Heart rates have been in the 60s to low 80s.  REVIEWED OF SYSTEMS   Review of Systems  Constitutional:  Positive for malaise/fatigue. Negative for weight loss (His weight at home was only 4 pounds up from his\baseline.  No PND orthopnea.).  HENT:  Negative for congestion.   Eyes:  Negative for blurred vision.  Respiratory:  Negative for shortness of breath (Not really dyspnea).   Cardiovascular:  Negative for leg swelling.  Gastrointestinal:  Negative for blood in stool and melena.  Genitourinary:  Negative for hematuria.  Musculoskeletal:  Positive for back pain and joint pain (L>R knee pain.  Back and hip pain-still walks with a cane.[name]).  Negative for falls.  Neurological:  Positive for dizziness (Intermittent onset dizziness but not as much with higher blood pressures.), weakness (Legs do feel weaker) and headaches. Negative for focal weakness.  Endo/Heme/Allergies:  Does  not bruise/bleed easily.  Psychiatric/Behavioral:  Positive for memory loss (Trivial). Negative for depression. The patient is not nervous/anxious and does not have insomnia.   Fatigue and poor balance.  Trivial memory loss.  I have reviewed and (if needed) personally updated the patient's problem list, medications, allergies, past medical and surgical history, social and family history.   PAST MEDICAL HISTORY   Past Medical History:  Diagnosis Date   Arthritis    CAD of autologous arterial graft 2010   a) ~2010 & 2017 - LIMA-LAD was atretic (LAD filling via SVG-D2); b)  04/2022: LIMA-LAD now fully patent, 85% mSVG-OM2 =? DES PCI   CAD S/P percutaneous coronary angioplasty 11/2006   a. 6/'08: PCI nongrafted distal RCA-PDA: Mini vision BMS 2.25 mm x 28 mm; b. Relook Cath 08/2015: stable,: atretic LIMA;; 04/2022: 85% SVG-OM2  => DES PCI Synergy XD 4.0 mmx16 mm); LM 60%, pLAD 70%, p-m LAD 100% & p-m LCX 100%; 40% dRCA with patent rPDA BMS; SVG-Diag & LIMA-LAD patent   Complication of anesthesia    Coronary artery disease involving left main coronary artery [id number]   Last cath 2010: 70-80 % ostial left main, 80-90% LAD, subtotal CTO LCx-OM2, RCA patent w/ patent BMS stent (dRCA-PDA); SVG-diagonal backfills LAD, atretic LIMA-LAD -- medical therapy; stable by relook cath in March 2017   Difficult intubation    VERY NARROW AIRWAY PER ANESTHESIOLOGIST (1997 CABG AT [facility])   Dyslipidemia, goal LDL below 70    Dysrhythmia    Heart palpitations    Never fully diagnosed.   History of kidney stones    Hypertension    Insomnia    Mass in chest    JUST WATCHING , WILL CHECK OUT AFTER SURGERY   Pneumonia    S/P CABG x 3 1997   LIMA-LAD, SVG-D2, SVG-OM --> LIMA known  to be atretic, but SVG-D2 fills diagonal and LAD   Stenosis of cervical spine with myelopathy (HCC)    Tingling    in fingers    PAST SURGICAL HISTORY   Past Surgical History:  Procedure Laterality Date   48-HOUR MONITOR  03/2017   Relatively normal.  Very infrequent ectopy.  Short PAT run with occasional PACs/PVCs and bigeminy.  No symptoms noted on monitor.  Longest PAT runs were less than 10 beats.   BACK SURGERY     decompression  lower back    CARDIAC CATHETERIZATION N/A 08/08/2015   Procedure: Left Heart Cath and Cors/Grafts Angiography;  Surgeon: Marykay Lex, MD;  Location: [facility] INVASIVE CV LAB;; -->  LM 80%-70%. Ost LAD 80% then prox-mid 100% (after small D1). Small LCx & OM2 patent with 100% OM2. Prox & distal RCA 20%, patent BMS in distal RCA-rPDA.  Patent/atretic LIMA-dLAD, SVG-D2 (backdills entire dLAD) & SVG-OM2.    COLONOSCOPY     CORONARY ANGIOPLASTY WITH STENT PLACEMENT  11/2006   BMS PCI of distal RCA-RPDA: PCI with a 2.25 mm x 28 mm Mini Vision bare-metal stent.   CORONARY ARTERY BYPASS GRAFT  [id number]   LIMA - LAD, SVG- diagonal, SVG-OM   CORONARY STENT INTERVENTION N/A 04/26/2022   Procedure: CORONARY STENT INTERVENTION;  Surgeon: Swaziland, Peter M, MD;  Location: [facility] INVASIVE CV LAB;  Service: CV: 95% mid SVG-OM2 => DES PCI (Synergy XD DES 4.0 mm x 16 mm) -> complicated by transient no reflow resolved with IC verapamil   EYE SURGERY     BIL CATARACT   KIDNEY STONE SURGERY     KNEE ARTHROSCOPY  RT  KNEE   LEFT HEART CATH AND CORS/GRAFTS ANGIOGRAPHY  11/2006   High-grade distal RCA and PDA stenosis --> BMS PCI.    LEFT HEART CATH AND CORS/GRAFTS ANGIOGRAPHY  2010   Native coronaries at 70% to 80% ostial left main. LAD had diffuse 80% to 90% stenosis   LEFT HEART CATH AND CORS/GRAFTS ANGIOGRAPHY N/A 04/26/2022   Procedure: LEFT HEART CATH AND CORS/GRAFTS ANGIOGRAPHY;  Surgeon: Swaziland, Peter M, MD;  Location: [facility] INVASIVE CV LAB;  Service: CV::   2 V Occlusive  Native CAD: 60% LM, 70% ostial-prox LAD, 100% mid LAD, 1 100% prox-mid LCx; 40% distRCA with patent PDA stent.  Patent LIMA-LAD, SVG-D2. 95% mid SVG-OM2 => DES PCI   NM MYOVIEW LTD  03/17/2017   No significant reversible ischemia. Increased TID ratio 1.34. LVEF 54% with normal wall motion. This is a low risk study.   POSTERIOR CERVICAL FUSION/FORAMINOTOMY N/A 07/14/2017   Procedure: LAMINECTOMY CERVICAL THREE- CERVICAL FOUR WITH LATERAL MASS FUSION;  Surgeon: Lisbeth Renshaw, MD;  Location: [facility] OR;  Service: Neurosurgery;  Laterality: N/A;  LAMINECTOMY CERVICAL 3- CERVICAL 4 WITH LATERAL MASS FUSION   TOTAL KNEE ARTHROPLASTY Right 04/07/2020   Procedure: TOTAL KNEE ARTHROPLASTY;  Surgeon: Dannielle Huh, MD;  Location: [facility] ORS;  Service: Orthopedics;  Laterality: Right;   TRANSTHORACIC ECHOCARDIOGRAM  04/26/2022   EF 60 to 65%, normal LV function, no RWMA, mild concentric LVH, G1 DD, normal RV systolic function, no significant valvular abnormalities.    There is no immunization history on file for this patient.  MEDICATIONS/ALLERGIES   Current Meds  Medication Sig   acetaminophen (TYLENOL) 500 MG tablet Take 1,000 mg by mouth every 8 (eight) hours as needed for mild pain.   aspirin EC 81 MG tablet Take 1 tablet (81 mg total) by mouth daily. Swallow whole.   azelastine (ASTELIN) 0.1 % nasal spray Place 1-2 sprays into both nostrils 2 (two) times daily. (Patient taking differently: Place 1-2 sprays into both nostrils daily as needed for allergies.)   chlorthalidone (HYGROTON) 25 MG tablet Take 0.5 tablets (12.5 mg total) by mouth daily.   cholecalciferol (VITAMIN D) 1000 units tablet Take 1,000 Units by mouth daily.   clindamycin (CLEOCIN) 300 MG capsule Take 600 mg by mouth 2 (two) times daily.   clopidogrel (PLAVIX) 75 MG tablet TAKE 1 TABLET(75 MG) BY MOUTH DAILY   irbesartan (AVAPRO) 300 MG tablet Take 1 tablet (300 mg total) by mouth daily.   isosorbide mononitrate (IMDUR) 30 MG 24 hr  tablet Take 0.5 tablets (15 mg total) by mouth daily.   loratadine (CLARITIN) 10 MG tablet Take 10 mg by mouth daily.   metoprolol succinate (TOPROL-XL) 25 MG 24 hr tablet Take 3 tablets (75 mg total) by mouth 2 (two) times daily. Take with or immediately following a meal.   montelukast (SINGULAIR) 10 MG tablet TAKE 1 TABLET(10 MG) BY MOUTH AT BEDTIME (Patient taking differently: Take 10 mg by mouth at bedtime.)   Multiple Vitamin (MULTIVITAMIN WITH MINERALS) TABS tablet Take 1 tablet by mouth daily.   nitroGLYCERIN (NITROSTAT) 0.4 MG SL tablet PLACE 1 TABLET UNDER THE TONGUE EVERY 5 MINUTES AS NEEDED FOR CHEST PAIN FOR 3 DOSES. IF NOT RELIEF AFTER FIRST DOSE, CALL PRESCRIBER OR 911.   tamsulosin (FLOMAX) 0.4 MG CAPS capsule Take 0.4 mg by mouth at bedtime.    traMADol (ULTRAM) 50 MG tablet Take 100 mg by mouth 2 (two) times daily.   triamcinolone (NASACORT) 55 MCG/ACT AERO nasal  inhaler Place 1 spray into the nose daily. (Patient taking differently: Place 1 spray into the nose daily as needed (allergies).)   vitamin B-12 (CYANOCOBALAMIN) 1000 MCG tablet Take 1,000 mcg by mouth 2 (two) times daily.    vitamin C (ASCORBIC ACID) 250 MG tablet Take 250 mg by mouth daily.   zolpidem (AMBIEN) 10 MG tablet Take 10 mg by mouth at bedtime as needed for sleep.    Allergies  Allergen Reactions   Penicillins Swelling and Other (See Comments)    Swelling and redness in joints  Has patient had a PCN reaction causing immediate rash, facial/tongue/throat swelling, SOB or lightheadedness with hypotension: No Has patient had a PCN reaction causing severe rash involving mucus membranes or skin necrosis: No Has patient had a PCN reaction that required hospitalization: No Has patient had a PCN reaction occurring within the last 10 years: No If all of the above answers are "NO", then may proceed with Cephalosporin use.    Sulfa Antibiotics Other (See Comments)    Pain in left side underneath ribcage-pancreas    Statins Other (See Comments)    UNSPECIFIED REACTION  "INTOLERANCE AT HIGH DOSES"    SOCIAL HISTORY/FAMILY HISTORY   Reviewed in Epic:  Pertinent findings:  Social History   Tobacco Use   Smoking status: Never   Smokeless tobacco: Never  Vaping Use   Vaping Use: Never used  Substance Use Topics   Alcohol use: No   Drug use: No   Social History   Social History Narrative   He is a widowed father of 2, grandfather 5. He is retired from [employer], Psychologist, educational.    His wife Cordelia Pen died in 07-03-2019  He now lives alone in a one story home.   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  He is usually very active, but is limited as far as standing exercise by his knee osteoarthritis.   He does not drink and does not smoke. Never smoked.   Education: college.     OBJCTIVE -PE, EKG, labs   Wt Readings from Last 3 Encounters:  06/03/22 202 lb (91.6 kg)  05/06/22 197 lb (89.4 kg)  04/24/22 190 lb (86.2 kg)    Physical Exam: BP (!) 140/72   Pulse 66   Ht  (1.905 m)   Wt 202 lb (91.6 kg)   SpO2 99%   BMI 25.25 kg/m  Physical Exam Vitals reviewed.  Constitutional:      General: He is not in acute distress.    Appearance: Normal appearance. He is normal weight. He is not ill-appearing (Healthy-appearing.  Looks good for stated age.).     Comments: Slim, well-nourished and well-groomed.  HENT:     Head: Normocephalic and atraumatic.  Eyes:     Extraocular Movements: Extraocular movements intact.     Pupils: Pupils are equal, round, and reactive to light.  Cardiovascular:     Rate and Rhythm: Normal rate and regular rhythm.     Pulses: Normal pulses.     Heart sounds: Normal heart sounds. No murmur heard.    No friction rub. No gallop.  Pulmonary:     Effort: Pulmonary effort is normal.     Breath sounds: Normal breath sounds. No wheezing, rhonchi or rales.  Abdominal:     General:  Bowel sounds are normal. There is no distension.     Tenderness: There is no abdominal  tenderness. There is no guarding.  Musculoskeletal:        General: No swelling. Normal range of motion.     Cervical back: Normal range of motion and neck supple.  Skin:    General: Skin is warm and dry.  Neurological:     General: No focal deficit present.     Mental Status: He is alert and oriented to person, place, and time. Mental status is at baseline.     Cranial Nerves: No cranial nerve deficit.     Sensory: No sensory deficit.     Gait: Gait abnormal.  Psychiatric:        Mood and Affect: Mood normal.        Behavior: Behavior normal.        Thought Content: Thought content normal.        Judgment: Judgment normal.     Adult ECG Report  Rate: 66 ;  Rhythm: normal sinus rhythm and normal axis, intervals & durations ; No ST-T changes  Narrative Interpretation: stable - normal   Recent Labs: Reviewed Lab Results  Component Value Date   CHOL 156 04/26/2022   HDL 34 (L) 04/26/2022   LDLCALC 98 04/26/2022   TRIG 119 04/26/2022   CHOLHDL 4.6 04/26/2022   Lab Results  Component Value Date   CREATININE 1.01 04/27/2022   BUN 20 04/27/2022   NA 139 04/27/2022   K 3.6 04/27/2022   CL 108 04/27/2022   CO2 22 04/27/2022      Latest Ref Rng & Units 04/27/2022   12:14 AM 04/26/2022    4:00 AM 04/24/2022    9:46 PM  CBC  WBC 4.0 - 10.5 K/uL 8.2  8.6  5.3   Hemoglobin 13.0 - 17.0 g/dL 32.6  71.2  45.8   Hematocrit 39.0 - 52.0 % 34.8  33.5  38.7   Platelets 150 - 400 K/uL 212  199  235     Lab Results  Component Value Date   HGBA1C 5.6 04/27/2022   Lab Results  Component Value Date   TSH 0.081 (L) 08/02/2021    ================================================== I spent a total of 40 minutes with the patient spent in direct patient consultation.  Additional time spent with chart review  / charting (studies, outside notes, etc): 26 min Total Time: 66 min  Current medicines  are reviewed at length with the patient today.  (+/- concerns) question about new medicines.  Notice: This dictation was prepared with Dragon dictation along with smart phrase technology. Any transcriptional errors that result from this process are unintentional and may not be corrected upon review.  Studies Ordered:   Orders Placed This Encounter  Procedures   Lipid panel   Comprehensive metabolic panel   EKG 12-Lead   Meds ordered this encounter  Medications   chlorthalidone (HYGROTON) 25 MG tablet    Sig: Take 0.5 tablets (12.5 mg total) by mouth daily.    Dispense:  30 tablet    Refill:  11    Patient Instructions / Medication Changes & Studies & Tests Ordered   Patient Instructions  Medication Instructions:   Chlorthalidone 12.5 mg  (1/2 tablet of 25 mg)   daily    *If you need a refill on your cardiac medications before your next appointment, please call your pharmacy*   Lab Work:fasting  Lipid cmp If you have labs (blood work) drawn today and your tests are completely normal, you will receive your results only by: MyChart Message (if you  have MyChart) OR A paper copy in the mail If you have any lab test that is abnormal or we need to change your treatment, we will call you to review the results.   Testing/Procedures: Not needed   Follow-Up: At [facility], you and your health needs are our priority.  As part of our continuing mission to provide you with exceptional heart care, we have created designated Provider Care Teams.  These Care Teams include your primary Cardiologist (physician) and Advanced Practice Providers (APPs -  Physician Assistants and Nurse Practitioners) who all work together to provide you with the care you need, when you need it.     Your next appointment:   4 month(s)  The format for your next appointment:   In Person  Provider:   Bryan Lemma, MD    Other Instructions   Keep appointment with [facility] - Jan 3 ,2024 -- follow  blood pressure,  lipids     Marykay Lex, MD, MS Bryan Lemma, M.D., M.S. Interventional Cardiologist  [facility]  Pager # [phone] Phone # [phone] [address] [place], [place] [address]   Thank you for choosing [facility] [place]!!

## 2022-06-04 ENCOUNTER — Encounter: Payer: Self-pay | Admitting: Cardiology

## 2022-06-04 NOTE — Assessment & Plan Note (Signed)
Blood pressure is actually close to normal today, but is on the low end of his BP range at home.  He does have elevated diastolic pressures at home in the 80s and 90s to the low 100s.  As such, would like to add some afterload reduction and volume removal.  Add chlorthalidone 12.5 mg daily.  Check labs next week when he sees the lipid clinic.  Would asked that the clinical pharmacy team reassess blood pressures and potentially titrate further if renal function and potassium will tolerate

## 2022-06-04 NOTE — Assessment & Plan Note (Signed)
No further angina after his most recent PCI.  I think he had a likely type IV MI with significant chest pain after no reflow post PCI.  There likely was distal embolization of thrombus.  The lesion was too tight to use distal protection device.  Now he is chest pain free we just need to control his blood pressure better.  With him no longer having chest pain, we can discontinue the Imdur.  Would preferentially use amlodipine if necessary for blood pressure and antianginal benefit. He is on high-dose Toprol 75 mg twice daily along with high-dose Avapro 300 mg daily with still inadequately controlled blood pressure. Has had issues with statin.  Referring to lipid clinic.  (Prior to his MI, he did not seem interested in PCSK9 inhibitor injections.  Now he would be willing to try.) On maintenance dose aspirin and Plavix DAPT x 1 year.

## 2022-06-04 NOTE — Assessment & Plan Note (Signed)
He has had continuous issues with fatigue.  One of the reason why would like to avoid being overly aggressive with treating his hypertension.  I had reduced his Toprol in the past, but clearly this was not noted while he was in the hospital.  I would probably prefer to use additional BP meds as opposed to titrating up his Toprol.  I anticipate backing back down to 100 mg a day at the most of Toprol.  However, until his blood pressures are more stable, we will continue with current meds.

## 2022-06-04 NOTE — Assessment & Plan Note (Signed)
Has tried multiple statins in the past, intolerant of just about all of them as well as Zetia.  Has significant myalgias and fatigue.

## 2022-06-04 NOTE — Assessment & Plan Note (Signed)
He has had orthostatic hypotension and is recently as this past summer.  Need to be really careful with being overly aggressive when treating his BP. Allow for permissive hypertension-blood pressure range that would be except would be 130s to 150s systolic.

## 2022-06-04 NOTE — Assessment & Plan Note (Signed)
No further unstable angina following PCI.  I think can DC Imdur and continue current dose of Toprol.  If additional blood pressure room is present, would consider amlodipine.

## 2022-06-04 NOTE — Assessment & Plan Note (Signed)
States he was doing well and was having issues with fatigue, we had decided that we would allow him to go without therapy, but now he has had another episode of unstable angina with PCI he is interested in pursuing nonstatin therapy.  He was not interested in trying Nexletol or Nexlizet in the past.  He had not wanted to do injections, but now is willing to give it consideration.  He has been referred to [facility] lipid clinic.  Due to be seen next week on January 3.  This will be for discussion of lipid management as well as blood pressure follow-up.

## 2022-06-09 ENCOUNTER — Ambulatory Visit: Payer: Medicare Other | Attending: Cardiovascular Disease | Admitting: Student

## 2022-06-09 DIAGNOSIS — E785 Hyperlipidemia, unspecified: Secondary | ICD-10-CM | POA: Diagnosis not present

## 2022-06-09 LAB — COMPREHENSIVE METABOLIC PANEL
ALT: 12 IU/L (ref 0–44)
AST: 15 IU/L (ref 0–40)
Albumin/Globulin Ratio: 1.1 — ABNORMAL LOW (ref 1.2–2.2)
Albumin: 3.9 g/dL (ref 3.7–4.7)
Alkaline Phosphatase: 94 IU/L (ref 44–121)
BUN/Creatinine Ratio: 27 — ABNORMAL HIGH (ref 10–24)
BUN: 30 mg/dL — ABNORMAL HIGH (ref 8–27)
Bilirubin Total: 0.8 mg/dL (ref 0.0–1.2)
CO2: 26 mmol/L (ref 20–29)
Calcium: 9.5 mg/dL (ref 8.6–10.2)
Chloride: 104 mmol/L (ref 96–106)
Creatinine, Ser: 1.1 mg/dL (ref 0.76–1.27)
Globulin, Total: 3.4 g/dL (ref 1.5–4.5)
Glucose: 108 mg/dL — ABNORMAL HIGH (ref 70–99)
Potassium: 5 mmol/L (ref 3.5–5.2)
Sodium: 139 mmol/L (ref 134–144)
Total Protein: 7.3 g/dL (ref 6.0–8.5)
eGFR: 65 mL/min/{1.73_m2} (ref >59)

## 2022-06-09 LAB — LIPID PANEL
Chol/HDL Ratio: 5.8 ratio — ABNORMAL HIGH (ref 0.0–5.0)
Cholesterol, Total: 191 mg/dL (ref 100–199)
HDL: 33 mg/dL — ABNORMAL LOW (ref >39)
LDL Chol Calc (NIH): 113 mg/dL — ABNORMAL HIGH (ref 0–99)
Triglycerides: 261 mg/dL — ABNORMAL HIGH (ref 0–149)
VLDL Cholesterol Cal: 45 mg/dL — ABNORMAL HIGH (ref 5–40)

## 2022-06-09 MED ORDER — EZETIMIBE 10 MG PO TABS: 10.0000 mg | ORAL_TABLET | Freq: Every day | ORAL | 3 refills | Status: DC

## 2022-06-09 NOTE — Progress Notes (Signed)
Patient ID: Daniel [name]                 DOB: May 25, 1933                    MRN: [id number]      HPI: Daniel [name] is a 89 y.o. male patient referred to lipid clinic by Liberty Handy. PMH is significant for recent MI S/P PCI nov 2023, CAD,hypertension, HDL, chronic knee pain   Today patient was in to discuss next option for cholesterol. Report he had CABGX3 in 1997 and he had recent MI. He remembers try different sat ain over the years remembers few names (Zocor, Lipitor, Crestor) but does not remember the strengths. They all caused terrible muscle cramps and aches which would affect his daily activities.    Diet: chicken baked  Eat out twice a month  Exercise: walking 15 min every day   Family History: dad had angina, died of heart attack at age 75  Mother died from stroke   Social History:  Alcohol: none  Smoking : never  Labs: Lipid Panel     Component Value Date/Time   CHOL 156 04/26/2022 0400   TRIG 119 04/26/2022 0400   HDL 34 (L) 04/26/2022 0400   CHOLHDL 4.6 04/26/2022 0400   VLDL 24 04/26/2022 0400   LDLCALC 98 04/26/2022 0400    Past Medical History:  Diagnosis Date   Arthritis    CAD of autologous arterial graft 2010   a) ~2010 & 2017 - LIMA-LAD was atretic (LAD filling via SVG-D2); b)  04/2022: LIMA-LAD now fully patent, 85% mSVG-OM2 =? DES PCI   CAD S/P percutaneous coronary angioplasty 11/2006   a. 6/'08: PCI nongrafted distal RCA-PDA: Mini vision BMS 2.25 mm x 28 mm; b. Relook Cath 08/2015: stable,: atretic LIMA;; 04/2022: 85% SVG-OM2  => DES PCI Synergy XD 4.0 mmx16 mm); LM 60%, pLAD 70%, p-m LAD 100% & p-m LCX 100%; 40% dRCA with patent rPDA BMS; SVG-Diag & LIMA-LAD patent   Complication of anesthesia    Coronary artery disease involving left main coronary artery [id number]   Last cath 2010: 70-80 % ostial left main, 80-90% LAD, subtotal CTO LCx-OM2, RCA patent w/ patent BMS stent (dRCA-PDA); SVG-diagonal backfills LAD, atretic LIMA-LAD -- medical therapy;  stable by relook cath in March 2017   Difficult intubation    VERY NARROW AIRWAY PER ANESTHESIOLOGIST (1997 CABG AT [facility])   Dyslipidemia, goal LDL below 70    Dysrhythmia    Heart palpitations    Never fully diagnosed.   History of kidney stones    Hypertension    Insomnia    Mass in chest    JUST WATCHING , WILL CHECK OUT AFTER SURGERY   Pneumonia    S/P CABG x 3 1997   LIMA-LAD, SVG-D2, SVG-OM --> LIMA known to be atretic, but SVG-D2 fills diagonal and LAD   Stenosis of cervical spine with myelopathy (HCC)    Tingling    in fingers    Current Outpatient Medications on File Prior to Visit  Medication Sig Dispense Refill   acetaminophen (TYLENOL) 500 MG tablet Take 1,000 mg by mouth every 8 (eight) hours as needed for mild pain.     aspirin EC 81 MG tablet Take 1 tablet (81 mg total) by mouth daily. Swallow whole. 90 tablet 2   azelastine (ASTELIN) 0.1 % nasal spray Place 1-2 sprays into both nostrils 2 (two) times daily. (Patient taking differently: Place 1-2  sprays into both nostrils daily as needed for allergies.) 30 mL 2   chlorthalidone (HYGROTON) 25 MG tablet Take 0.5 tablets (12.5 mg total) by mouth daily. 30 tablet 11   cholecalciferol (VITAMIN D) 1000 units tablet Take 1,000 Units by mouth daily.     clindamycin (CLEOCIN) 300 MG capsule Take 600 mg by mouth 2 (two) times daily.     clopidogrel (PLAVIX) 75 MG tablet TAKE 1 TABLET(75 MG) BY MOUTH DAILY 90 tablet 3   irbesartan (AVAPRO) 300 MG tablet Take 1 tablet (300 mg total) by mouth daily. 90 tablet 1   isosorbide mononitrate (IMDUR) 30 MG 24 hr tablet Take 0.5 tablets (15 mg total) by mouth daily. 30 tablet 3   loratadine (CLARITIN) 10 MG tablet Take 10 mg by mouth daily.     metoprolol succinate (TOPROL-XL) 25 MG 24 hr tablet Take 3 tablets (75 mg total) by mouth 2 (two) times daily. Take with or immediately following a meal. 180 tablet 2   montelukast (SINGULAIR) 10 MG tablet TAKE 1 TABLET(10 MG) BY MOUTH AT BEDTIME  (Patient taking differently: Take 10 mg by mouth at bedtime.) 90 tablet 0   Multiple Vitamin (MULTIVITAMIN WITH MINERALS) TABS tablet Take 1 tablet by mouth daily.     nitroGLYCERIN (NITROSTAT) 0.4 MG SL tablet PLACE 1 TABLET UNDER THE TONGUE EVERY 5 MINUTES AS NEEDED FOR CHEST PAIN FOR 3 DOSES. IF NOT RELIEF AFTER FIRST DOSE, CALL PRESCRIBER OR 911. 25 tablet 3   tamsulosin (FLOMAX) 0.4 MG CAPS capsule Take 0.4 mg by mouth at bedtime.   0   traMADol (ULTRAM) 50 MG tablet Take 100 mg by mouth 2 (two) times daily.     triamcinolone (NASACORT) 55 MCG/ACT AERO nasal inhaler Place 1 spray into the nose daily. (Patient taking differently: Place 1 spray into the nose daily as needed (allergies).) 16.5 g 5   vitamin B-12 (CYANOCOBALAMIN) 1000 MCG tablet Take 1,000 mcg by mouth 2 (two) times daily.      vitamin C (ASCORBIC ACID) 250 MG tablet Take 250 mg by mouth daily.     zolpidem (AMBIEN) 10 MG tablet Take 10 mg by mouth at bedtime as needed for sleep.     No current facility-administered medications on file prior to visit.    Allergies  Allergen Reactions   Penicillins Swelling and Other (See Comments)    Swelling and redness in joints  Has patient had a PCN reaction causing immediate rash, facial/tongue/throat swelling, SOB or lightheadedness with hypotension: No Has patient had a PCN reaction causing severe rash involving mucus membranes or skin necrosis: No Has patient had a PCN reaction that required hospitalization: No Has patient had a PCN reaction occurring within the last 10 years: No If all of the above answers are "NO", then may proceed with Cephalosporin use.    Sulfa Antibiotics Other (See Comments)    Pain in left side underneath ribcage-pancreas   Statins Other (See Comments)    UNSPECIFIED REACTION  "INTOLERANCE AT HIGH DOSES"    Assessment/Plan:  1. Hyperlipidemia -  Problem  Dyslipidemia, Goal Ldl Below 70   Current Medications: Ezetimibe 10 mg (started on  06/09/2022) Intolerances: Zocor, Lipitor, rosuvastatin - severe muscle pain and cramps  Risk Factors: recent MI S/P PCI nov 2023, CAD,hypertension, elevated Lp(a) 191 (04/26/2022) LDL goal: < 55 mg/dl    Dyslipidemia, goal LDL below 70 Assessment:  LDL goal: < 55 mg/dl last LDLc 98 mg/dl (04/26/2022) Lp(a) 191 Intolerance to  statins (Zocor,  Lipitor, Crestor 5 mg and 10 mg) Discussed next potential options (ezetimibe, PCSK9i, bempedoic acid and inclisiran); cost, dosing efficacy, side effects  Next step- try ezetimibe however does not lowe LDLc significantly and hard to achieve goal LDLc; reasonable to add PCSK9i  Plan: Start taking ezetimibe 10 mg daily Will apply for PA for PCSK9i; will inform patient upon approval  Lipid lab due in 2-3 months after starting PCSK9i  Thank you,  Cammy Copa, Pharm.D [facility] A Division of [facility] [address], [place], [place] [address]  Phone: [phone]; Fax: [phone]

## 2022-06-09 NOTE — Assessment & Plan Note (Signed)
Assessment:  LDL goal: < 55 mg/dl last LDLc 98 mg/dl (04/26/2022) Lp(a) 191 Intolerance to  statins (Zocor, Lipitor, Crestor 5 mg and 10 mg) Discussed next potential options (ezetimibe, PCSK9i, bempedoic acid and inclisiran); cost, dosing efficacy, side effects  Next step- try ezetimibe however does not lowe LDLc significantly and hard to achieve goal LDLc; reasonable to add PCSK9i  Plan: Start taking ezetimibe 10 mg daily Will apply for PA for PCSK9i; will inform patient upon approval  Lipid lab due in 2-3 months after starting [facility]

## 2022-06-14 ENCOUNTER — Telehealth: Payer: Self-pay | Admitting: Pharmacist

## 2022-06-14 DIAGNOSIS — E785 Hyperlipidemia, unspecified: Secondary | ICD-10-CM

## 2022-06-14 NOTE — Telephone Encounter (Signed)
Initiated PA request for Repatha  (Key: [id number]) approved through 12/14/2022. Patient informed about prescription and follow up lab in 2-3 months

## 2022-06-15 ENCOUNTER — Encounter (HOSPITAL_COMMUNITY): Payer: Self-pay

## 2022-06-15 ENCOUNTER — Other Ambulatory Visit: Payer: Self-pay | Admitting: Pharmacist

## 2022-06-15 DIAGNOSIS — E785 Hyperlipidemia, unspecified: Secondary | ICD-10-CM

## 2022-06-15 MED ORDER — REPATHA SURECLICK 140 MG/ML ~~LOC~~ SOAJ: 140.0000 mg | SUBCUTANEOUS | 3 refills | Status: DC

## 2022-06-30 ENCOUNTER — Telehealth (HOSPITAL_COMMUNITY): Payer: Self-pay

## 2022-06-30 NOTE — Telephone Encounter (Signed)
No response from pt.  Closed referral  

## 2022-07-04 ENCOUNTER — Other Ambulatory Visit: Payer: Self-pay | Admitting: Cardiology

## 2022-07-23 ENCOUNTER — Other Ambulatory Visit: Payer: Self-pay | Admitting: Cardiology

## 2022-09-14 ENCOUNTER — Other Ambulatory Visit: Payer: Self-pay | Admitting: Cardiology

## 2022-10-04 ENCOUNTER — Encounter: Payer: Self-pay | Admitting: Cardiology

## 2022-10-04 ENCOUNTER — Ambulatory Visit: Payer: Medicare Other | Attending: Cardiology | Admitting: Cardiology

## 2022-10-04 VITALS — BP 115/72 | HR 65 | Ht 75.0 in | Wt 196.6 lb

## 2022-10-04 DIAGNOSIS — I25118 Atherosclerotic heart disease of native coronary artery with other forms of angina pectoris: Secondary | ICD-10-CM | POA: Diagnosis not present

## 2022-10-04 DIAGNOSIS — T466X5S Adverse effect of antihyperlipidemic and antiarteriosclerotic drugs, sequela: Secondary | ICD-10-CM

## 2022-10-04 DIAGNOSIS — I951 Orthostatic hypotension: Secondary | ICD-10-CM | POA: Diagnosis not present

## 2022-10-04 DIAGNOSIS — E785 Hyperlipidemia, unspecified: Secondary | ICD-10-CM

## 2022-10-04 DIAGNOSIS — Z951 Presence of aortocoronary bypass graft: Secondary | ICD-10-CM

## 2022-10-04 DIAGNOSIS — I10 Essential (primary) hypertension: Secondary | ICD-10-CM | POA: Diagnosis not present

## 2022-10-04 DIAGNOSIS — R5383 Other fatigue: Secondary | ICD-10-CM

## 2022-10-04 DIAGNOSIS — M791 Myalgia, unspecified site: Secondary | ICD-10-CM

## 2022-10-04 DIAGNOSIS — I200 Unstable angina: Secondary | ICD-10-CM

## 2022-10-04 MED ORDER — METOPROLOL SUCCINATE ER 50 MG PO TB24: 50.0000 mg | ORAL_TABLET | Freq: Every day | ORAL | 3 refills | Status: DC

## 2022-10-04 NOTE — Progress Notes (Signed)
Primary Care Provider: Geoffry Paradise, MD [facility] Cardiologist: Daniel Lemma, MD Electrophysiologist: None  Clinic Note: Chief Complaint  Patient presents with   Follow-up    ~6 months.  Doing well.  Just still has less energy than usual.  Has not been feeling well from a GI standpoint-6 pounds weight loss.   Coronary Artery Disease    No angina    ===================================  ASSESSMENT/PLAN   Problem List Items Addressed This Visit       Cardiology Problems   Unstable angina (HCC)   Relevant Medications   metoprolol succinate (TOPROL-XL) 50 MG 24 hr tablet   Orthostatic hypotension (Chronic)    If he is feeling lightheaded, dizzy or not drinking well.  He should not take chlorthalidone. Would also recommend taking half a dose of Avapro if having dizziness spells.  Needs to make sure that he is ensuring adequate hydration.      Relevant Medications   metoprolol succinate (TOPROL-XL) 50 MG 24 hr tablet   LM-CAD:  LIMA-LAD, patent SVG-D2 & SVG-OM2 (DES PCI) , BMS PCI rPDA (Chronic)    Doing fairly well with no further angina since his most recent cardiac catheterization and PCI back in November 2023.  A little bit concerned about his lack of energy and fatigue from the increased dose of beta-blocker.  Plan: He remains on half dose of Imdur which we may be able to fully discontinue at next visit. Will reduce Toprol dose to 50 mg daily. Continue Avapro for afterload reduction. He is currently on DAPT, but I think we can stop aspirin at 6 months and continue Plavix long-term.   Plan had been 1 year but with increased bruising-okay to stop aspirin as of June. He is on Zetia, not taking a statin because of memory issues and myalgias. Unfortunately, he has not yet started the Repatha.  He was concerned about side effects.  I recommend that he actually does injections once a month instead of twice a month to see if this is tolerable.  If he does  tolerate it once a month injections, we could change the formulation to once monthly We can reassess labs at follow-up visit.      Relevant Medications   metoprolol succinate (TOPROL-XL) 50 MG 24 hr tablet   Labile essential hypertension - Primary (Chronic)    Blood pressure is best better now on the chlorthalidone but he is having some dizziness spells and fatigue.  Plan for now to be just to reduce the Toprol dose back to 50 mg from 75.  Otherwise continue other meds.      Relevant Medications   metoprolol succinate (TOPROL-XL) 50 MG 24 hr tablet   Dyslipidemia, goal LDL below 70 (Chronic)    Goal LDL is less than 55 but in all honesty at age 6, repeat can be happy with less than 70.  Unfortunately, he is not at goal and just had PCI.  Have him take his Repatha once monthly.  Continue Zetia.  Can hold off on labs until he is seen back in follow-up.      Relevant Medications   metoprolol succinate (TOPROL-XL) 50 MG 24 hr tablet     Other   S/P CABG x 3 (Chronic)   Myalgia due to statin (Chronic)    In the past we have tried multiple different statins and has been intolerant of them from myalgias and fatigue as well as headaches and memory issues.  In the past he was unable  to tolerate Zetia but he is currently taking it and tolerating it relatively well.  We could potentially consider Nexletol if he does not tolerate PCSK9 inhibitor.      Fatigue due to treatment (Chronic)    In the past we had to reduce his dose of Toprol and he felt better.  At this point I think we can reduce from 75 to 50 mg.       ===================================  HPI:    Daniel [name] is a 89 y.o. male with a PMH below who presents today for ~5-36-month follow-up at the request of Daniel Paradise, MD.  CAD s/p CABG x3 in 1997  for MV-LM CAD: LIMA-LAD, SVG-Diag, SVG-OM,  11/2006 -admitted for CHF symptoms: PTCA of rPDA Cath 2017: ~ Atretic LIMA-LAD (LAD fills via SVG-Diag, SVG-OM & rPDA Stents  patent 04/2022: Unstable Angina => PCI SVG-OM1 (with ~Type IVa MI - no reflow with stent deployment - resorted TIMI 3 flow with IV Adenosine Labile hypertension, Hyperlipidemia Palpitations History of retinal vein occlusion  Daniel [name] was last seen on 06/03/2022 for his second follow-up after his hospitalization and PCI.  He was doing well post PCI.  He only had a couple episodes of chest pain 3 days after his PCI but none since.  He had lots of angina leading up to the PCI.  What led him to present to the hospital was postprandial angina.  All this was totally improved.  He is noted overall less energy than more increased fatigue.  He noted that he was not able to walk as much because his knee has been bothering him.  Better than it had been, still stiff from the surgery.  Favoring his knee [place] having pain in his density ankles, & his back.  No heart failure or palpitation symptoms.=> Had issues with BP;  Started chlorthalidone 12.5 mg daily Continue Avapro 300 mg daily and Toprol 75 mg daily.  He was also on Imdur 30 mg daily. We checked labs-lipids were not controlled and therefore was referred to lipid clinic.  He was seen by Carmela Hurt, RPH from the pain clinic on June 09, 2022: Started Zetia 10 mg daily and applied for PCSK9 inhibitor.  Plan was to reassess lipids after initiating PCSK9 inhibitor.  Recent Hospitalizations: None   Reviewed  CV studies:    The following studies were reviewed today: (if available, images/films reviewed: From Epic Chart or Care Everywhere) N/A:  Interval History:   Daniel [name] returns here today for basely 71-month follow-up doing quite well.  He never did start taking PCSK9 ammeter he has a prescription but he decided not to do it he was concerned about side effects. He still says he is more tired than he would like to be but pushes on.  He does yard work and simple jobs but just has to stop more during the day.  He says he sleeps on and  off throughout the day and then does not get good sleep so he sleeps during the day some.  Just does not have as much energy during the day.  Despite this he still tries to walk at least 15 minutes a day, and denies any active cardiac symptoms.  He still notes having some bruising from antiplatelet agents.  No major bleeding though.. Unlike last time I saw him when he was having high blood pressure spells, he is now noting dizziness with standing.  CV Review of Symptoms (Summary): no chest pain or dyspnea on  exertion positive for - feeling tired and fatigued which in conjunction with arthritis pains limits his walking.  Bruising but no bleeding negative for - edema, irregular heartbeat, orthopnea, palpitations, paroxysmal nocturnal dyspnea, rapid heart rate, shortness of breath, or lightheadedness or dizziness, syncope or near syncope, TIA or amaurosis fugax.  Claudication.  REVIEWED OF SYSTEMS   Pertinent review of symptoms noted above, otherwise positive symptoms noted below: Most prominent symptom recently has been GI upset with some nausea and abdominal pain.  Has lost about 6 pounds in the month because of his GI issues. Still notes less energy than baseline, but still pushes through it. Sleeps more during the day than he used to. Left greater than right knee pain and back pain.  Limits his walking. Sometimes has dizziness with standing up quickly. Borderline memory loss.  I have reviewed and (if needed) personally updated the patient's problem list, medications, allergies, past medical and surgical history, social and family history.   PAST MEDICAL HISTORY   Past Medical History:  Diagnosis Date   Arthritis    CAD of autologous arterial graft 2010   a) ~2010 & 2017 - LIMA-LAD was atretic (LAD filling via SVG-D2); b)  04/2022: LIMA-LAD now fully patent, 85% mSVG-OM2 =? DES PCI   CAD S/P percutaneous coronary angioplasty 11/2006   a. 6/'08: PCI nongrafted distal RCA-PDA: Mini vision  BMS 2.25 mm x 28 mm; b. Relook Cath 08/2015: stable,: atretic LIMA;; 04/2022: 85% SVG-OM2  => DES PCI Synergy XD 4.0 mmx16 mm); LM 60%, pLAD 70%, p-m LAD 100% & p-m LCX 100%; 40% dRCA with patent rPDA BMS; SVG-Diag & LIMA-LAD patent   Complication of anesthesia    Coronary artery disease involving left main coronary artery [id number]   Last cath 2010: 70-80 % ostial left main, 80-90% LAD, subtotal CTO LCx-OM2, RCA patent w/ patent BMS stent (dRCA-PDA); SVG-diagonal backfills LAD, atretic LIMA-LAD -- medical therapy; stable by relook cath in March 2017   Difficult intubation    VERY NARROW AIRWAY PER ANESTHESIOLOGIST (1997 CABG AT [facility])   Dyslipidemia, goal LDL below 70    Dysrhythmia    Heart palpitations    Never fully diagnosed.   History of kidney stones    Hypertension    Insomnia    Mass in chest    JUST WATCHING , WILL CHECK OUT AFTER SURGERY   Pneumonia    S/P CABG x 3 1997   LIMA-LAD, SVG-D2, SVG-OM --> LIMA known to be atretic, but SVG-D2 fills diagonal and LAD   Stenosis of cervical spine with myelopathy (HCC)    Tingling    in fingers    PAST SURGICAL HISTORY   Past Surgical History:  Procedure Laterality Date   48-HOUR MONITOR  03/2017   Relatively normal.  Very infrequent ectopy.  Short PAT run with occasional PACs/PVCs and bigeminy.  No symptoms noted on monitor.  Longest PAT runs were less than 10 beats.   BACK SURGERY     decompression  lower back    CARDIAC CATHETERIZATION N/A 08/08/2015   Procedure: Left Heart Cath and Cors/Grafts Angiography;  Surgeon: Marykay Lex, MD;  Location: [facility] INVASIVE CV LAB;; -->  LM 80%-70%. Ost LAD 80% then prox-mid 100% (after small D1). Small LCx & OM2 patent with 100% OM2. Prox & distal RCA 20%, patent BMS in distal RCA-rPDA.  Patent/atretic LIMA-dLAD, SVG-D2 (backdills entire dLAD) & SVG-OM2.    COLONOSCOPY     CORONARY ANGIOPLASTY WITH STENT PLACEMENT  11/2006  BMS PCI of distal RCA-RPDA: PCI with a 2.25 mm x 28 mm Mini Vision  bare-metal stent.   CORONARY ARTERY BYPASS GRAFT  [id number]   LIMA - LAD, SVG- diagonal, SVG-OM   CORONARY STENT INTERVENTION N/A 04/26/2022   Procedure: CORONARY STENT INTERVENTION;  Surgeon: Swaziland, Peter M, MD;  Location: [facility] INVASIVE CV LAB;  Service: CV: 95% mid SVG-OM2 => DES PCI (Synergy XD DES 4.0 mm x 16 mm) -> complicated by transient no reflow resolved with IC verapamil   EYE SURGERY     BIL CATARACT   KIDNEY STONE SURGERY     KNEE ARTHROSCOPY     RT  KNEE   LEFT HEART CATH AND CORS/GRAFTS ANGIOGRAPHY  11/2006   High-grade distal RCA and PDA stenosis --> BMS PCI.    LEFT HEART CATH AND CORS/GRAFTS ANGIOGRAPHY  2010   Native coronaries at 70% to 80% ostial left main. LAD had diffuse 80% to 90% stenosis   LEFT HEART CATH AND CORS/GRAFTS ANGIOGRAPHY N/A 04/26/2022   Procedure: LEFT HEART CATH AND CORS/GRAFTS ANGIOGRAPHY;  Surgeon: Swaziland, Peter M, MD;  Location: [facility] INVASIVE CV LAB;  Service: CV::   2 V Occlusive Native CAD: 60% LM, 70% ostial-prox LAD, 100% mid LAD, 1 100% prox-mid LCx; 40% distRCA with patent PDA stent.  Patent LIMA-LAD, SVG-D2. 95% mid SVG-OM2 => DES PCI   NM MYOVIEW LTD  03/17/2017   No significant reversible ischemia. Increased TID ratio 1.34. LVEF 54% with normal wall motion. This is a low risk study.   POSTERIOR CERVICAL FUSION/FORAMINOTOMY N/A 07/14/2017   Procedure: LAMINECTOMY CERVICAL THREE- CERVICAL FOUR WITH LATERAL MASS FUSION;  Surgeon: Lisbeth Renshaw, MD;  Location: [facility] OR;  Service: Neurosurgery;  Laterality: N/A;  LAMINECTOMY CERVICAL 3- CERVICAL 4 WITH LATERAL MASS FUSION   TOTAL KNEE ARTHROPLASTY Right 04/07/2020   Procedure: TOTAL KNEE ARTHROPLASTY;  Surgeon: Dannielle Huh, MD;  Location: [facility] ORS;  Service: Orthopedics;  Laterality: Right;   TRANSTHORACIC ECHOCARDIOGRAM  04/26/2022   EF 60 to 65%, normal LV function, no RWMA, mild concentric LVH, G1 DD, normal RV systolic function, no significant valvular abnormalities.   Echo 04/26/2022:  EF 60 to  65%, normal LV function, no RWMA, mild concentric LVH, G1 DD, normal RV systolic function, no significant valvular abnormalities.    Cardiac Cath-PCI  04/26/2022: 2 V Occlusive Native CAD: 60% LM, 70% ostial-prox LAD, 100% mid LAD, 1 100% prox-mid LCx; 40% distRCA with patent PDA stent.  Patent LIMA-LAD, SVG-D2. 95% mid SVG-OM2 => DES PCI (Synergy XD DES 4.0 mm x 16 mm) -> complicated by transient no reflow resolved with IC verapamil   Diagnostic: Dominance: Right                                                                          Intervention   MEDICATIONS/ALLERGIES   Current Meds  Medication Sig   acetaminophen (TYLENOL) 500 MG tablet Take 1,000 mg by mouth every 8 (eight) hours as needed for mild pain.   aspirin EC 81 MG tablet Take 1 tablet (81 mg total) by mouth daily. Swallow whole.   azelastine (ASTELIN) 0.1 % nasal spray Place 1-2 sprays into both nostrils 2 (two) times daily. (Patient taking differently: Place  1-2 sprays into both nostrils daily as needed for allergies.)   chlorthalidone (HYGROTON) 25 MG tablet Take 0.5 tablets (12.5 mg total) by mouth daily.   cholecalciferol (VITAMIN D) 1000 units tablet Take 1,000 Units by mouth daily.   clindamycin (CLEOCIN) 300 MG capsule Take 600 mg by mouth 2 (two) times daily.   clopidogrel (PLAVIX) 75 MG tablet TAKE 1 TABLET(75 MG) BY MOUTH DAILY   Evolocumab (REPATHA SURECLICK) 140 MG/ML SOAJ Inject 140 mg into the skin every 14 (fourteen) days.   ezetimibe (ZETIA) 10 MG tablet Take 1 tablet (10 mg total) by mouth daily.   irbesartan (AVAPRO) 300 MG tablet Take 1 tablet (300 mg total) by mouth daily.   isosorbide mononitrate (IMDUR) 30 MG 24 hr tablet Take 0.5 tablets (15 mg total) by mouth daily.   loratadine (CLARITIN) 10 MG tablet Take 10 mg by mouth daily.   montelukast (SINGULAIR) 10 MG tablet TAKE 1 TABLET(10 MG) BY MOUTH AT BEDTIME (Patient taking differently: Take 10 mg by mouth at bedtime.)   Multiple Vitamin (MULTIVITAMIN  WITH MINERALS) TABS tablet Take 1 tablet by mouth daily.   nitroGLYCERIN (NITROSTAT) 0.4 MG SL tablet PLACE 1 TABLET UNDER THE TONGUE EVERY 5 MINUTES AS NEEDED FOR CHEST PAIN FOR 3 DOSES. IF NOT RELIEF AFTER FIRST DOSE, CALL PRESCRIBER OR 911.   tamsulosin (FLOMAX) 0.4 MG CAPS capsule Take 0.4 mg by mouth at bedtime.    traMADol (ULTRAM) 50 MG tablet Take 100 mg by mouth 2 (two) times daily.   triamcinolone (NASACORT) 55 MCG/ACT AERO nasal inhaler Place 1 spray into the nose daily. (Patient taking differently: Place 1 spray into the nose daily as needed (allergies).)   vitamin B-12 (CYANOCOBALAMIN) 1000 MCG tablet Take 1,000 mcg by mouth 2 (two) times daily.    vitamin C (ASCORBIC ACID) 250 MG tablet Take 250 mg by mouth daily.   zolpidem (AMBIEN) 10 MG tablet Take 10 mg by mouth at bedtime as needed for sleep.   []  metoprolol succinate (TOPROL-XL) 25 MG 24 hr tablet TAKE 3 TABLETS(75 MG) BY MOUTH TWICE DAILY WITH OR IMMEDIATELY FOLLOWING A MEAL    Allergies  Allergen Reactions   Penicillins Swelling and Other (See Comments)    Swelling and redness in joints  Has patient had a PCN reaction causing immediate rash, facial/tongue/throat swelling, SOB or lightheadedness with hypotension: No Has patient had a PCN reaction causing severe rash involving mucus membranes or skin necrosis: No Has patient had a PCN reaction that required hospitalization: No Has patient had a PCN reaction occurring within the last 10 years: No If all of the above answers are "NO", then may proceed with Cephalosporin use.    Sulfa Antibiotics Other (See Comments)    Pain in left side underneath ribcage-pancreas   Statins Other (See Comments)    UNSPECIFIED REACTION  "INTOLERANCE AT HIGH DOSES"    SOCIAL HISTORY/FAMILY HISTORY   Reviewed in Epic:  Pertinent findings:  Social History   Tobacco Use   Smoking status: Never   Smokeless tobacco: Never  Vaping Use   Vaping Use: Never used  Substance Use Topics    Alcohol use: No   Drug use: No   Social History   Social History Narrative   He is a widowed father of 2, grandfather 5. He is retired from [employer], Psychologist, educational.    His wife Cordelia Pen died in 2019/07/10  He now lives alone in a one story home.   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  He is usually very active, but is limited as far as standing exercise by his knee osteoarthritis.   He does not drink and does not smoke. Never smoked.   Education: college.     OBJCTIVE -PE, EKG, labs   Wt Readings from Last 3 Encounters:  10/04/22 196 lb 9.6 oz (89.2 kg)  06/03/22 202 lb (91.6 kg)  05/06/22 197 lb (89.4 kg)    Physical Exam: BP 115/72   Pulse 65   Ht 6\' 3"  (1.905 m)   Wt 196 lb 9.6 oz (89.2 kg)   SpO2 97%   BMI 24.57 kg/m  Physical Exam Vitals reviewed.  Constitutional:      General: He is not in acute distress.    Appearance: Normal appearance. He is not ill-appearing or toxic-appearing.     Comments: Tall, well-nourished and well-groomed.  Healthy-appearing.  HENT:     Head: Normocephalic and atraumatic.  Neck:     Vascular: No carotid bruit or JVD.  Cardiovascular:     Rate and Rhythm: Normal rate and regular rhythm. No extrasystoles are present.    Chest Wall: PMI is not displaced.     Pulses: Normal pulses.     Heart sounds: Normal heart sounds, S1 normal and S2 normal. No murmur heard.    No friction rub. No gallop.  Pulmonary:     Effort: Pulmonary effort is normal. No respiratory distress.     Breath sounds: Normal breath sounds. No wheezing, rhonchi or rales.  Musculoskeletal:        General: Swelling present. Normal range of motion.     Cervical back: Normal range of motion and neck supple.  Skin:    General: Skin is warm and dry.     Findings: Bruising present.  Neurological:     General: No focal deficit present.     Mental Status: He is alert and oriented to  person, place, and time. Mental status is at baseline.     Gait: Gait abnormal.  Psychiatric:        Mood and Affect: Mood normal.        Behavior: Behavior normal.        Thought Content: Thought content normal.        Judgment: Judgment normal.      Adult ECG Report N/A  Recent Labs:    Lab Results  Component Value Date   CHOL 191 06/09/2022   HDL 33 (L) 06/09/2022   LDLCALC 113 (H) 06/09/2022   TRIG 261 (H) 06/09/2022   CHOLHDL 5.8 (H) 06/09/2022   Lab Results  Component Value Date   CREATININE 1.10 06/09/2022   BUN 30 (H) 06/09/2022   NA 139 06/09/2022   K 5.0 06/09/2022   CL 104 06/09/2022   CO2 26 06/09/2022      Latest Ref Rng & Units 04/27/2022   12:14 AM 04/26/2022    4:00 AM 04/24/2022    9:46 PM  CBC  WBC 4.0 - 10.5 K/uL 8.2  8.6  5.3   Hemoglobin 13.0 - 17.0 g/dL 16.1  09.6  04.5   Hematocrit 39.0 - 52.0 % 34.8  33.5  38.7   Platelets 150 - 400 K/uL 212  199  235     Lab Results  Component Value Date   HGBA1C 5.6 04/27/2022   Lab Results  Component Value Date   TSH 0.081 (L) 08/02/2021    ==================================================  I spent a total of 20 minutes with the patient spent in direct patient consultation.  Additional time spent with chart review  / charting (studies, outside notes, etc): 16 min Total Time: 36 min  Current medicines are reviewed at length with the patient today.  (+/- concerns) none  Notice: This dictation was prepared with Dragon dictation along with smart phrase technology. Any transcriptional errors that result from this process are unintentional and may not be corrected upon review.  Studies Ordered:   No orders of the defined types were placed in this encounter.  Meds ordered this encounter  Medications   metoprolol succinate (TOPROL-XL) 50 MG 24 hr tablet    Sig: Take 1 tablet (50 mg total) by mouth daily. Take with or immediately following a meal.    Dispense:  180 tablet    Refill:  3     Patient will be calling when medication is needed.    Patient Instructions / Medication Changes & Studies & Tests Ordered   Patient Instructions  Medication Instructions:    Decrease Toprol 50 mg  ( metoprolol succinate )   one tablet twice a day   (you can take 2 tablets of 25 mg  dose twice a day until bottle is empty- and then call the office when yo need prescription refilled )    Recommend you take Repatha injection once a month instead of twice a month.   *If you need a refill on your cardiac medications before your next appointment, please call your pharmacy*   Lab Work:  Not needed   Testing/Procedures: Not needed   Follow-Up: At [facility], you and your health needs are our priority.  As part of our continuing mission to provide you with exceptional heart care, we have created designated Provider Care Teams.  These Care Teams include your primary Cardiologist (physician) and Advanced Practice Providers (APPs -  Physician Assistants and Nurse Practitioners) who all work together to provide you with the care you need, when you need it.     Your next appointment:   7 to 8 month(s) ( Call in June for appt in Nov/Dec 2024)   The format for your next appointment:   In Person  Provider:   Bryan Lemma, MD    Other Instructions      Marykay Lex, MD, MS Daniel [name], M.D., M.S. Interventional Cardiologist  [facility]  Pager # [phone] Phone # [phone] [address] [place], [place] [address]   Thank you for choosing [facility] [place]!!

## 2022-10-04 NOTE — Patient Instructions (Addendum)
Medication Instructions:    Decrease Toprol 50 mg  ( metoprolol succinate )   one tablet twice a day   (you can take 2 tablets of 25 mg  dose twice a day until bottle is empty- and then call the office when yo need prescription refilled )    Recommend you take Repatha injection once a month instead of twice a month.   *If you need a refill on your cardiac medications before your next appointment, please call your pharmacy*   Lab Work:  Not needed   Testing/Procedures: Not needed   Follow-Up: At [facility], you and your health needs are our priority.  As part of our continuing mission to provide you with exceptional heart care, we have created designated Provider Care Teams.  These Care Teams include your primary Cardiologist (physician) and Advanced Practice Providers (APPs -  Physician Assistants and Nurse Practitioners) who all work together to provide you with the care you need, when you need it.     Your next appointment:   7 to 8 month(s) ( Call in June for appt in Nov/Dec 2024)   The format for your next appointment:   In Person  Provider:   Bryan Lemma, MD    Other Instructions

## 2022-10-16 ENCOUNTER — Encounter: Payer: Self-pay | Admitting: Cardiology

## 2022-10-16 NOTE — Assessment & Plan Note (Signed)
In the past we have tried multiple different statins and has been intolerant of them from myalgias and fatigue as well as headaches and memory issues.  In the past he was unable to tolerate Zetia but he is currently taking it and tolerating it relatively well.  We could potentially consider Nexletol if he does not tolerate PCSK9 inhibitor.

## 2022-10-16 NOTE — Assessment & Plan Note (Signed)
If he is feeling lightheaded, dizzy or not drinking well.  He should not take chlorthalidone. Would also recommend taking half a dose of Avapro if having dizziness spells.  Needs to make sure that he is ensuring adequate hydration.

## 2022-10-16 NOTE — Assessment & Plan Note (Signed)
Goal LDL is less than 55 but in all honesty at age 89, repeat can be happy with less than 70.  Unfortunately, he is not at goal and just had PCI.  Have him take his Repatha once monthly.  Continue Zetia.  Can hold off on labs until he is seen back in follow-up.

## 2022-10-16 NOTE — Assessment & Plan Note (Signed)
Blood pressure is best better now on the chlorthalidone but he is having some dizziness spells and fatigue.  Plan for now to be just to reduce the Toprol dose back to 50 mg from 75.  Otherwise continue other meds.

## 2022-10-16 NOTE — Assessment & Plan Note (Signed)
In the past we had to reduce his dose of Toprol and he felt better.  At this point I think we can reduce from 75 to 50 mg.

## 2022-10-16 NOTE — Assessment & Plan Note (Signed)
Doing fairly well with no further angina since his most recent cardiac catheterization and PCI back in November 2023.  A little bit concerned about his lack of energy and fatigue from the increased dose of beta-blocker.  Plan: He remains on half dose of Imdur which we may be able to fully discontinue at next visit. Will reduce Toprol dose to 50 mg daily. Continue Avapro for afterload reduction. He is currently on DAPT, but I think we can stop aspirin at 6 months and continue Plavix long-term.   Plan had been 1 year but with increased bruising-okay to stop aspirin as of June. He is on Zetia, not taking a statin because of memory issues and myalgias. Unfortunately, he has not yet started the Repatha.  He was concerned about side effects.  I recommend that he actually does injections once a month instead of twice a month to see if this is tolerable.  If he does tolerate it once a month injections, we could change the formulation to once monthly We can reassess labs at follow-up visit.

## 2022-10-19 ENCOUNTER — Other Ambulatory Visit: Payer: Self-pay | Admitting: Cardiology

## 2022-10-29 ENCOUNTER — Telehealth: Payer: Self-pay | Admitting: Cardiology

## 2022-10-29 NOTE — Telephone Encounter (Signed)
Patients states an hour ago his BP 101/67 HR  States low for the last few days but didn't take his BP until today.  States one of his meds was changed (metoprolol).  Was feeling lightheaded, not dizzy.  Feel "like I can't walk when I get up". As if from bed or chair. It resolves to where he can walk but feels weak.  Today took longer to resolve.   Metoprolol 50 mg Twice a day.  Was to do the following if dizzy spells, not take chlorthalidone and half Avapro.  He was not doing this, but wrote it down at this time.  He did state he had not taken the chlorthalidone today as yet, so will not take it.  Advised to try this and keep record of BP.  If continues he can call after hours or if drops low with SOB, or fainting, he should go to ED.  He will call next week if continued issues

## 2022-10-29 NOTE — Telephone Encounter (Signed)
Pt c/o BP issue: STAT if pt c/o blurred vision, one-sided weakness or slurred speech  1. What are your last 5 BP readings?  5/24: 101:67  2. Are you having any other symptoms (ex. Dizziness, headache, blurred vision, passed out)?  Lightheadedness  3. What is your BP issue?  BP has been low and he feels lightheaded whenhe gets up

## 2022-11-08 ENCOUNTER — Telehealth: Payer: Self-pay | Admitting: Physical Medicine and Rehabilitation

## 2022-11-08 NOTE — Telephone Encounter (Signed)
Pt called requesting a call back to set an appt for back injections. Please call pt at [phone]

## 2022-11-08 NOTE — Telephone Encounter (Signed)
Spoke to the patient, he is experiencing fatigue, lightheadedness and dizziness off and on for a week.  Patient stated he took a quarter of metoprolol yesterday and did not metoprolol today. Pt will like to d/c metoprolol due to his current symptoms. He does monitor his bp. Will forward to MD and nurse for advise.  Blood pressure  6/3 111/68 HR 80 6/2 144/88 HR 65 ( pt went on a morning walk prior to taking bp)  6/1 100/64 HR 80 5/31 105/64

## 2022-11-08 NOTE — Telephone Encounter (Signed)
Patient calling back to say that his bp is still running low. Would like to speak to the nurse. Please advise  Schedule pay for appt on 6/12 at 2:20

## 2022-11-11 ENCOUNTER — Telehealth: Payer: Self-pay | Admitting: Physical Medicine and Rehabilitation

## 2022-11-11 NOTE — Telephone Encounter (Signed)
LVM to return call to get more information 

## 2022-11-11 NOTE — Telephone Encounter (Signed)
Patient return call asked for a call back to schedule an appointment with Dr. Alvester Morin. The number to contact patient is [phone]

## 2022-11-11 NOTE — Telephone Encounter (Signed)
Spoke with patient and scheduled OV for 11/15/22.

## 2022-11-12 MED ORDER — IRBESARTAN 150 MG PO TABS: 150.0000 mg | ORAL_TABLET | Freq: Every day | ORAL | 3 refills | Status: DC

## 2022-11-12 MED ORDER — METOPROLOL SUCCINATE ER 25 MG PO TB24: 25.0000 mg | ORAL_TABLET | Freq: Two times a day (BID) | ORAL | 3 refills | Status: DC

## 2022-11-12 NOTE — Telephone Encounter (Signed)
My recommendation would be to stay on Metoprolol - but 1/2 tab BID & also cut Avapro to 1/2 tab.  If taking Chlorthalidone - would cut back to every other day.   Would rather have a couple meds at low dose instead of only one med at high dose.   Bryan Lemma, MD      Gave the patient the information above- He will take Metoprolol 25 mg BID; Avapro 150 mg and pt reports he is no longer taking the Chlorthalidone- taken off his list.   Pt has an appt on 11/17/22 at 2:20.   He verbalized understanding.

## 2022-11-12 NOTE — Addendum Note (Signed)
Addended by: Scheryl Marten on: 11/12/2022 03:54 PM   Modules accepted: Orders

## 2022-11-12 NOTE — Telephone Encounter (Signed)
My recommendation would be to stay on Metoprolol - but 1/2 tab BID & also cut Avapro to 1/2 tab.  If taking Chlorthalidone - would cut back to every other day.  Would rather have a couple meds at low dose instead of only one med at high dose.  Bryan Lemma, MD

## 2022-11-15 ENCOUNTER — Ambulatory Visit: Payer: Medicare Other | Admitting: Physical Medicine and Rehabilitation

## 2022-11-15 ENCOUNTER — Encounter: Payer: Self-pay | Admitting: Physical Medicine and Rehabilitation

## 2022-11-15 DIAGNOSIS — R269 Unspecified abnormalities of gait and mobility: Secondary | ICD-10-CM | POA: Diagnosis not present

## 2022-11-15 DIAGNOSIS — G8929 Other chronic pain: Secondary | ICD-10-CM

## 2022-11-15 DIAGNOSIS — M961 Postlaminectomy syndrome, not elsewhere classified: Secondary | ICD-10-CM | POA: Diagnosis not present

## 2022-11-15 DIAGNOSIS — M47816 Spondylosis without myelopathy or radiculopathy, lumbar region: Secondary | ICD-10-CM

## 2022-11-15 DIAGNOSIS — M545 Low back pain, unspecified: Secondary | ICD-10-CM
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
## 2022-11-15 NOTE — Progress Notes (Unsigned)
Functional Pain Scale - descriptive words and definitions  Moderate (4)   Constantly aware of pain, can complete ADLs with modification/sleep marginally affected at times/passive distraction is of no use, but active distraction gives some relief. Moderate range order  Average Pain varies  Lower back pain on both sides that radiates into the hips and up the back

## 2022-11-15 NOTE — Progress Notes (Unsigned)
Cardiology Clinic Note   Date: 11/17/2022 ID: Daniel [name], DOB [birth year], MRN [id number]  Primary Cardiologist:  Bryan Lemma, MD  Patient Profile    Daniel [name] is a 89 y.o. male who presents to the clinic today for follow-up after medication changes Past medical history significant for: CAD. CABG x 3 1997: LIMA to LAD, SVG to OM, SVG to diagonal. LHC 11/04/2004 (angina): Significant LM and three-vessel CAD.  Patent but small LIMA to LAD.  Patent SVG to diagonal and SVG to OM. LHC 11/14/2006 (angina): LM 40%.  Mid LAD 80%.  First OM 99%.  New lesions mid posterior descending 85 and 90%.  LIMA to LAD with 60% distal anastomotic site stenosis.  Patent SVG to diagonal with competitive flow between vein graft to diagonal and i LIMA to LAD.  Patent SVG to OM1.  PCI with BMS 2.25 x 28 mm to mid posterior descending. LHC 04/26/2022 (unstable angina): Two-vessel occlusive CAD.  Continued patency of stent in the PDA.  Patent LIMA to LAD.  Patent SVG to D2.  Patent SVG to OM 2.  Critical stenosis mid SVG.  Successful PCI with DES to SVG to OM 2. Echo 04/26/2022: EF 60 to 65%.  Mild concentric LVH.  Grade I DD.  Mild LAE.  Trivial MR. Labile hypertension. Orthostatic hypotension. Hyperlipidemia. Lipid panel 06/09/2022: LDL 113, HDL 33, TG 261, total 191. Palpitations.   History of Present Illness    Daniel [name] is a longtime patient of cardiology.  He is followed by Dr. Herbie Baltimore for the above outlined history.  Patient was last seen in the office by Dr. Herbie Baltimore on 10/04/2022 for routine follow-up.  Patient continues to complain of chronic orthostatic hypotension and labile hypertension.  Patient denied further angina since last catheterization.  He was instructed to continue half dose of Imdur with consideration to discontinue at next visit.  Toprol was reduced to 50 mg daily.  Avapro was continued but he was instructed to take half a dose if he was having dizzy spells.  He complained of  bruising and was instructed to stop aspirin in June and continue Plavix as monotherapy.  He stopped his statin secondary to memory issues and myalgias.  He had not yet started Repatha and was concerned about side effects.  It was suggested he do once a month injections to see if he can tolerate that.  Patient contacted the office on 10/29/2022 with complaints of low BP and lightheadedness.  Medication schedule decided at last visit was clarified with patient.  He contacted the office again on 11/08/2022 with continued complaints of hypotension and lightheadedness.  He was scheduled for an office visit.  Per triage notes: Spoke to the patient, he is experiencing fatigue, lightheadedness and dizziness off and on for a week.  Patient stated he took a quarter of metoprolol yesterday and did not metoprolol today. Pt will like to d/c metoprolol due to his current symptoms. He does monitor his bp. Will forward to MD and nurse for advise. Blood pressure 6/3 111/68 HR 80 6/2 144/88 HR 65 ( pt went on a morning walk prior to taking bp)  6/1 100/64 HR 80 5/31 105/64 Dr. Herbie Baltimore suggested half tablet of metoprolol twice daily and half tablet of Avapro.  He was instructed to take chlorthalidone every other day.  Today, patient reports improved BP since changes above.  Recent blood pressure: 6/4 129/70 6/6 124/76 6/9 129/74 611 138/76 Patient denies headache, dizziness, blurred vision with higher  BP readings.  If SBP is <120 he does experience some positional dizziness.  He has not had any dizziness since medication changes were made.  He denies chest pain, tightness, pressure.  No lower extremity edema, orthopnea, PND.  He does report chronic fatigue that he was hoping would improve after PCI in November. He is hoping to have lumber nerve ablation in a few weeks. His activity is limited by back and knee pain. He is able to mow his lawn and performs all light and moderate household activities.  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   ROS: All other systems reviewed and are otherwise negative except as noted in History of Present Illness.  Studies Reviewed    ECG is not ordered today.      Physical Exam    VS:  BP 134/84   Pulse 72   Ht 6\' 3"  (1.905 m)   Wt 198 lb 9.6 oz (90.1 kg)   SpO2 98%   BMI 24.82 kg/m  , BMI Body mass index is 24.82 kg/m.  GEN: Well nourished, well developed, in no acute distress. Neck: No JVD or carotid bruits. Cardiac:  RRR. No murmurs. No rubs or gallops.   Respiratory:  Respirations regular and unlabored. Clear to auscultation without rales, wheezing or rhonchi. GI: Soft, nontender, nondistended. Extremities: Radials/DP/PT 2+ and equal bilaterally. No clubbing or cyanosis. No edema.  Skin: Warm and dry, no rash. Neuro: Strength intact.  Assessment & Plan    Orthostatic hypotension/lightheadedness/dizziness.  Patient with chronic issues of orthostatic hypotension.  He contacted the office at the end of May/early June with complaints of hypotension and lightheadedness.  BP readings ranged from 100/64 to 111/68 with 1 reading at 144/88.  Metoprolol and Avapro were decreased and patient was instructed to take chlorthalidone every other day.  Patient denies further episodes of dizziness since changing medications.  BP readings have improved. Hypertension. BP today 134/84 on intake and 130/68 on recheck. Continue metoprolol 25 mg twice daily, isosorbide 15 mg, Avapro 75 mg daily. CAD.  S/p CABG x 3 1997.  PCI with BMS to mid PDA June 2008.  PCI with DES to SVG to OM 08 April 2022.  Patent LIMA to LAD and SVG to D2.  Patient denies chest pain, tightness, pressure.  He was instructed by Dr. Herbie Baltimore to stop aspirin this month.  Continue Plavix, Repatha, Zetia, isosorbide, metoprolol, as needed SL NTG. Hyperlipidemia.  Patient is intolerant to statins secondary to memory loss and myalgias.  LDL January 2024 113, not at goal.  Patient was  instructed by Dr. Herbie Baltimore to start Repatha 1 injection a month and continue Zetia. Preoperative cardiovascular risk assessment.  Patient is planning on having lumbar nerve ablation in a few weeks. According to the RCRI, patient has a 0.9% risk of MACE. Patient reports activity equivalent to >4.0 METS (mowing the lawn, light to moderate household duties). Based on ACC/AHA guidelines, JOAL EAKLE would be at acceptable risk for the planned procedure without further cardiovascular testing. Per Dr. Herbie Baltimore, he may hold Plavix for 5-7 days prior to procedure and should resume as soon as hemodynamically stable postoperatively.     Disposition: Return in 5 months or sooner as needed.         Signed, Daniel Grandchild. Gunner Iodice, DNP, NP-C

## 2022-11-17 ENCOUNTER — Encounter: Payer: Self-pay | Admitting: Student

## 2022-11-17 ENCOUNTER — Ambulatory Visit: Payer: Medicare Other | Attending: Student | Admitting: Student

## 2022-11-17 VITALS — BP 134/84 | HR 72 | Ht 75.0 in | Wt 198.6 lb

## 2022-11-17 DIAGNOSIS — I951 Orthostatic hypotension: Secondary | ICD-10-CM

## 2022-11-17 DIAGNOSIS — Z951 Presence of aortocoronary bypass graft: Secondary | ICD-10-CM

## 2022-11-17 DIAGNOSIS — I25810 Atherosclerosis of coronary artery bypass graft(s) without angina pectoris: Secondary | ICD-10-CM | POA: Diagnosis not present

## 2022-11-17 DIAGNOSIS — R42 Dizziness and giddiness: Secondary | ICD-10-CM | POA: Diagnosis not present

## 2022-11-17 DIAGNOSIS — E785 Hyperlipidemia, unspecified: Secondary | ICD-10-CM

## 2022-11-17 DIAGNOSIS — I10 Essential (primary) hypertension: Secondary | ICD-10-CM

## 2022-11-17 DIAGNOSIS — Z01810 Encounter for preprocedural cardiovascular examination: Secondary | ICD-10-CM

## 2022-11-17 NOTE — Patient Instructions (Signed)
Medication Instructions:  The current medical regimen is effective;  continue present plan and medications.  *If you need a refill on your cardiac medications before your next appointment, please call your pharmacy*  Follow-Up: At [facility], you and your health needs are our priority.  As part of our continuing mission to provide you with exceptional heart care, we have created designated Provider Care Teams.  These Care Teams include your primary Cardiologist (physician) and Advanced Practice Providers (APPs -  Physician Assistants and Nurse Practitioners) who all work together to provide you with the care you need, when you need it.  We recommend signing up for the patient portal called "MyChart".  Sign up information is provided on this After Visit Summary.  MyChart is used to connect with patients for Virtual Visits (Telemedicine).  Patients are able to view lab/test results, encounter notes, upcoming appointments, etc.  Non-urgent messages can be sent to your provider as well.   To learn more about what you can do with MyChart, go to [email or web address].    Your next appointment:   5 month(s)  Provider:   Bryan Lemma, MD

## 2022-11-18 ENCOUNTER — Encounter: Payer: Self-pay | Admitting: Student

## 2022-12-02 ENCOUNTER — Other Ambulatory Visit: Payer: Self-pay

## 2022-12-02 ENCOUNTER — Ambulatory Visit: Payer: Medicare Other | Admitting: Physical Medicine and Rehabilitation

## 2022-12-02 VITALS — BP 99/58 | HR 68

## 2022-12-02 DIAGNOSIS — M47816 Spondylosis without myelopathy or radiculopathy, lumbar region: Secondary | ICD-10-CM

## 2022-12-02 MED ORDER — METHYLPREDNISOLONE ACETATE 80 MG/ML IJ SUSP
80.0000 mg | Freq: Once | INTRAMUSCULAR | Status: AC
  Administered 2022-12-02: 80 mg

## 2022-12-02 NOTE — Progress Notes (Signed)
Functional Pain Scale - descriptive words and definitions  Distracting (5)    Aware of pain/able to complete some ADL's but limited by pain/sleep is affected and active distractions are only slightly useful. Moderate range order  Average Pain 4-5   +Driver, -BT, -Dye Allergies.  Lower back pain on both sides

## 2022-12-02 NOTE — Patient Instructions (Signed)

## 2022-12-03 NOTE — Progress Notes (Signed)
Daniel [name] - 89 y.o. male MRN [id number]  Date of birth: 1932-08-10  Office Visit Note: Visit Date: 12/02/2022 PCP: Geoffry Paradise, MD Referred by: Geoffry Paradise, MD  Subjective: Chief Complaint  Patient presents with   Lower Back - Pain   HPI:  Daniel [name] is a 89 y.o. male who comes in todayfor planned repeat radiofrequency ablation of the Right L4-5 and L5-S1  Lumbar facet joints. This would be ablation of the corresponding medial branches and/or dorsal rami.  Patient has had double diagnostic blocks with more than 70% relief.  Subsequent ablation gave them more than 6 months of over 60% relief.  They have had chronic back pain for quite some time, more than 3 months, which has been an ongoing situation with recalcitrant axial back pain.  They have no radicular pain.  Their axial pain is worse with standing and ambulating and on exam today with facet loading.  They have had physical therapy as well as home exercise program.  The imaging noted in the chart below indicated facet pathology. Accordingly they meet all the criteria and qualification for for radiofrequency ablation and we are going to complete this today hopefully for more longer term relief as part of comprehensive management program.   ROS Otherwise per HPI.  Assessment & Plan: Visit Diagnoses:    ICD-10-CM   1. Spondylosis without myelopathy or radiculopathy, lumbar region  M47.816 XR C-ARM NO REPORT    Radiofrequency,Lumbar    methylPREDNISolone acetate (DEPO-MEDROL) injection 80 mg      Plan: No additional findings.   Meds & Orders:  Meds ordered this encounter  Medications   methylPREDNISolone acetate (DEPO-MEDROL) injection 80 mg    Orders Placed This Encounter  Procedures   Radiofrequency,Lumbar   XR C-ARM NO REPORT    Follow-up: Return if symptoms worsen or fail to improve.   Procedures: No procedures performed  Lumbar Facet Joint Nerve Denervation  Patient: Daniel [name]      Date of  Birth: 02/02/1933 MRN: [id number] PCP: Geoffry Paradise, MD      Visit Date: 12/02/2022   Universal Protocol:    Date/Time: 06/28/248:44 AM  Consent Given By: the patient  Position: PRONE  Additional Comments: Vital signs were monitored before and after the procedure. Patient was prepped and draped in the usual sterile fashion. The correct patient, procedure, and site was verified.   Injection Procedure Details:   Procedure diagnoses:  1. Spondylosis without myelopathy or radiculopathy, lumbar region      Meds Administered:  Meds ordered this encounter  Medications   methylPREDNISolone acetate (DEPO-MEDROL) injection 80 mg     Laterality: Right  Location/Site:  L4-L5, L3 and L4 medial branches and L5-S1, L4 medial branch and L5 dorsal ramus  Needle: 18 ga.,  10mm active tip, RF Cannula  Needle Placement: Along juncture of superior articular process and transverse pocess  Findings:  -Comments:  Procedure Details: For each desired target nerve, the corresponding transverse process (sacral ala for the L5 dorsal rami) was identified and the fluoroscope was positioned to square off the endplates of the corresponding vertebral body to achieve a true AP midline view.  The beam was then obliqued 15 to 20 degrees and caudally tilted 15 to 20 degrees to line up a trajectory along the target nerves. The skin over the target of the junction of superior articulating process and transverse process (sacral ala for the L5 dorsal rami) was infiltrated with 1ml of 1% Lidocaine without  Epinephrine.  The 18 gauge 10mm active tip outer cannula was advanced in trajectory view to the target.  This procedure was repeated for each target nerve.  Then, for all levels, the outer cannula placement was fine-tuned and the position was then confirmed with bi-planar imaging.    Test stimulation was done both at sensory and motor levels to ensure there was no radicular stimulation. The target tissues  were then infiltrated with 1 ml of 1% Lidocaine without Epinephrine. Subsequently, a percutaneous neurotomy was carried out for 90 seconds at 80 degrees Celsius.  After the completion of the lesion, 1 ml of injectate was delivered. It was then repeated for each facet joint nerve mentioned above. Appropriate radiographs were obtained to verify the probe placement during the neurotomy.   Additional Comments:  No complications occurred Dressing: 2 x 2 sterile gauze and Band-Aid    Post-procedure details: Patient was observed during the procedure. Post-procedure instructions were reviewed.  Patient left the clinic in stable condition.      Clinical History: 05/19/2017 NCV & EMG Findings: Extensive electrodiagnostic testing of the left upper and lower extremity shows:  1. Right median, radial, and ulnar sensory responses are within normal limits. 2. Right median and ulnar motor responses are within normal limits. 3. Right sural and superficial peroneal sensory responses are absent. 4. Right tibial motor response shows reduced amplitude and slowed conduction velocity. The peroneal motor response is absent at the extensor digitorum brevis, and normal at the tibialis anterior. 5. Right tibial H reflex study shows prolonged latency. 6. There is no evidence of active or chronic motor axon loss changes affecting any of the tested muscles in the left upper extremity. 7. Chronic motor axon loss changes are seen affecting the left gastrocnemius and biceps femoris short head muscles, without accompanied active denervation.   Impression: 1. 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  A superimposed S1 radiculopathy affecting the left lower extremity is also likely. 2. There is no evidence of a sensorimotor neuropathy or cervical radiculopathy affecting the left upper extremity.      ___________________________ Nita Sickle, DO -------------------------------- MRI lumbar spine:   TECHNIQUE: Sagittal and axial T1 and T2-weighted sequences were performed. Additional sagittal STIR images were performed.   INDICATION: Back pain   COMPARISON: None   FINDINGS:  #  5 mm grade 1 anterolisthesis L4-5 with minimal grade 1 anterolisthesis is L5-S1.  #  Vertebral body heights are well maintained.  #  Trace type stress I Modic endplate change L3-4 and type II at L4-5  #  Conus terminates at T12-L1 without evidence of tethering.  #  Nerve roots appear normal.  #  Incidental findings: None.    #  L1-2: Mild degenerative disc disease. Canal is well-maintained. Neuroforamina patent.  #    #  L2-3: Mild degenerative disc disease. Central canal is well-maintained. Neural foramina are patent.  #    #  L3-4: Mild degenerative disc disease. There is facet and disc bulge causing mild central canal stenosis with mild bilateral foraminal narrowing.  #    #  L4-5: Severe degenerative disc disease. Grade 1 anterolisthesis and laminectomy decompression with facet arthropathy and disc uncovering causing mild central canal and lateral recess stenosis and moderate bilateral foraminal narrowing  #    #  L5-S1: Grade 1 anterolisthesis. Facet arthropathy and disc bulge causes mild central canal stenosis and mild bilateral foraminal narrowing.  IMPRESSION:   1.  L4-5 severe degenerative disc disease with grade 1 anterolisthesis and laminectomy decompression. There is mild residual central canal lateral recess stenosis.   2.  L5-S1 minimal grade 1 anterolisthesis contributes to mild central canal stenosis   Electronically Signed by: Abner Greenspan on 06/10/2021 1:58 PM     Objective:  VS:  HT:    WT:   BMI:     BP:(!) 99/58  HR:68bpm  TEMP: ( )  RESP:  Physical Exam Vitals and nursing note reviewed.  Constitutional:      General: He is not in acute distress.    Appearance: Normal  appearance. He is not ill-appearing.  HENT:     Head: Normocephalic and atraumatic.     Right Ear: External ear normal.     Left Ear: External ear normal.     Nose: No congestion.  Eyes:     Extraocular Movements: Extraocular movements intact.  Cardiovascular:     Rate and Rhythm: Normal rate.     Pulses: Normal pulses.  Pulmonary:     Effort: Pulmonary effort is normal. No respiratory distress.  Abdominal:     General: There is no distension.     Palpations: Abdomen is soft.  Musculoskeletal:        General: No tenderness or signs of injury.     Cervical back: Neck supple.     Right lower leg: No edema.     Left lower leg: No edema.     Comments: Patient has good distal strength without clonus.  Skin:    Findings: No erythema or rash.  Neurological:     General: No focal deficit present.     Mental Status: He is alert and oriented to person, place, and time.     Sensory: No sensory deficit.     Motor: No weakness or abnormal muscle tone.     Coordination: Coordination normal.  Psychiatric:        Mood and Affect: Mood normal.        Behavior: Behavior normal.      Imaging: XR C-ARM NO REPORT  Result Date: 12/02/2022 Please see Notes tab for imaging impression.

## 2022-12-03 NOTE — Procedures (Signed)
Lumbar Facet Joint Nerve Denervation  Patient: Daniel [name]      Date of Birth: Jun 14, 1932 MRN: [id number] PCP: Geoffry Paradise, MD      Visit Date: 12/02/2022   Universal Protocol:    Date/Time: 06/28/248:44 AM  Consent Given By: the patient  Position: PRONE  Additional Comments: Vital signs were monitored before and after the procedure. Patient was prepped and draped in the usual sterile fashion. The correct patient, procedure, and site was verified.   Injection Procedure Details:   Procedure diagnoses:  1. Spondylosis without myelopathy or radiculopathy, lumbar region      Meds Administered:  Meds ordered this encounter  Medications   methylPREDNISolone acetate (DEPO-MEDROL) injection 80 mg     Laterality: Right  Location/Site:  L4-L5, L3 and L4 medial branches and L5-S1, L4 medial branch and L5 dorsal ramus  Needle: 18 ga.,  10mm active tip, RF Cannula  Needle Placement: Along juncture of superior articular process and transverse pocess  Findings:  -Comments:  Procedure Details: For each desired target nerve, the corresponding transverse process (sacral ala for the L5 dorsal rami) was identified and the fluoroscope was positioned to square off the endplates of the corresponding vertebral body to achieve a true AP midline view.  The beam was then obliqued 15 to 20 degrees and caudally tilted 15 to 20 degrees to line up a trajectory along the target nerves. The skin over the target of the junction of superior articulating process and transverse process (sacral ala for the L5 dorsal rami) was infiltrated with 1ml of 1% Lidocaine without Epinephrine.  The 18 gauge 10mm active tip outer cannula was advanced in trajectory view to the target.  This procedure was repeated for each target nerve.  Then, for all levels, the outer cannula placement was fine-tuned and the position was then confirmed with bi-planar imaging.    Test stimulation was done both at sensory and  motor levels to ensure there was no radicular stimulation. The target tissues were then infiltrated with 1 ml of 1% Lidocaine without Epinephrine. Subsequently, a percutaneous neurotomy was carried out for 90 seconds at 80 degrees Celsius.  After the completion of the lesion, 1 ml of injectate was delivered. It was then repeated for each facet joint nerve mentioned above. Appropriate radiographs were obtained to verify the probe placement during the neurotomy.   Additional Comments:  No complications occurred Dressing: 2 x 2 sterile gauze and Band-Aid    Post-procedure details: Patient was observed during the procedure. Post-procedure instructions were reviewed.  Patient left the clinic in stable condition.

## 2022-12-13 ENCOUNTER — Telehealth: Payer: Self-pay

## 2022-12-13 NOTE — Telephone Encounter (Signed)
Pharmacy Patient Advocate Encounter  Received notification from [facility] that Prior Authorization for Repatha [employer] has been APPROVED from 12/13/2022 to 06/07/2023.[name]  PA #/Case ID/Reference #: [id number] KEY: [id number]

## 2022-12-14 ENCOUNTER — Other Ambulatory Visit: Payer: Self-pay

## 2022-12-14 ENCOUNTER — Ambulatory Visit: Payer: Medicare Other | Admitting: Physical Medicine and Rehabilitation

## 2022-12-14 VITALS — BP 108/64 | HR 75

## 2022-12-14 DIAGNOSIS — M47816 Spondylosis without myelopathy or radiculopathy, lumbar region: Secondary | ICD-10-CM

## 2022-12-14 NOTE — Progress Notes (Signed)
Functional Pain Scale - descriptive words and definitions  Uncomfortable (3)  Pain is present but can complete all ADL's/sleep is slightly affected and passive distraction only gives marginal relief. Mild range order  Average Pain 4   +Driver, -BT, -Dye Allergies.  Lower back pain on right

## 2022-12-14 NOTE — Progress Notes (Signed)
Daniel [name] - 89 y.o. male MRN [id number]  Date of birth: 1932-08-19  Office Visit Note: Visit Date: 12/14/2022 PCP: Geoffry Paradise, MD Referred by: Geoffry Paradise, MD  Subjective: Chief Complaint  Patient presents with   Lower Back - Pain   HPI:  Daniel [name] is a 89 y.o. male who comes in todayfor planned repeat radiofrequency ablation of the Left L4-5 and L5-S1  Lumbar facet joints. This would be ablation of the corresponding medial branches and/or dorsal rami.  Patient has had double diagnostic blocks with more than 70% relief.  Subsequent ablation gave them more than 6 months of over 60% relief.  They have had chronic back pain for quite some time, more than 3 months, which has been an ongoing situation with recalcitrant axial back pain.  They have no radicular pain.  Their axial pain is worse with standing and ambulating and on exam today with facet loading.  They have had physical therapy as well as home exercise program.  The imaging noted in the chart below indicated facet pathology. Accordingly they meet all the criteria and qualification for for radiofrequency ablation and we are going to complete this today hopefully for more longer term relief as part of comprehensive management program.   ROS Otherwise per HPI.  Assessment & Plan: Visit Diagnoses:    ICD-10-CM   1. Spondylosis without myelopathy or radiculopathy, lumbar region  M47.816 XR C-ARM NO REPORT    Radiofrequency,Lumbar      Plan: No additional findings.   Meds & Orders: No orders of the defined types were placed in this encounter.   Orders Placed This Encounter  Procedures   Radiofrequency,Lumbar   XR C-ARM NO REPORT    Follow-up: Return if symptoms worsen or fail to improve.   Procedures: No procedures performed  Lumbar Facet Joint Nerve Denervation  Patient: Daniel [name]      Date of Birth: 01/18/1933 MRN: [id number] PCP: Geoffry Paradise, MD      Visit Date: 12/14/2022   Universal  Protocol:    Date/Time: 07/09/243:35 PM  Consent Given By: the patient  Position: PRONE  Additional Comments: Vital signs were monitored before and after the procedure. Patient was prepped and draped in the usual sterile fashion. The correct patient, procedure, and site was verified.   Injection Procedure Details:   Procedure diagnoses:  1. Spondylosis without myelopathy or radiculopathy, lumbar region      Meds Administered: No orders of the defined types were placed in this encounter.    Laterality: Left  Location/Site:  L4-L5, L3 and L4 medial branches and L5-S1, L4 medial branch and L5 dorsal ramus  Needle: 18 ga.,  10mm active tip, RF Cannula  Needle Placement: Along juncture of superior articular process and transverse pocess  Findings:  -Comments:  Procedure Details: For each desired target nerve, the corresponding transverse process (sacral ala for the L5 dorsal rami) was identified and the fluoroscope was positioned to square off the endplates of the corresponding vertebral body to achieve a true AP midline view.  The beam was then obliqued 15 to 20 degrees and caudally tilted 15 to 20 degrees to line up a trajectory along the target nerves. The skin over the target of the junction of superior articulating process and transverse process (sacral ala for the L5 dorsal rami) was infiltrated with 1ml of 1% Lidocaine without Epinephrine.  The 18 gauge 10mm active tip outer cannula was advanced in trajectory view to the target.  This procedure was repeated for each target nerve.  Then, for all levels, the outer cannula placement was fine-tuned and the position was then confirmed with bi-planar imaging.    Test stimulation was done both at sensory and motor levels to ensure there was no radicular stimulation. The target tissues were then infiltrated with 1 ml of 1% Lidocaine without Epinephrine. Subsequently, a percutaneous neurotomy was carried out for 90 seconds at 80  degrees Celsius.  After the completion of the lesion, 1 ml of injectate was delivered. It was then repeated for each facet joint nerve mentioned above. Appropriate radiographs were obtained to verify the probe placement during the neurotomy.   Additional Comments:  No complications occurred Dressing: 2 x 2 sterile gauze and Band-Aid    Post-procedure details: Patient was observed during the procedure. Post-procedure instructions were reviewed.  Patient left the clinic in stable condition.      Clinical History: 05/19/2017 NCV & EMG Findings: Extensive electrodiagnostic testing of the left upper and lower extremity shows:  1. Right median, radial, and ulnar sensory responses are within normal limits. 2. Right median and ulnar motor responses are within normal limits. 3. Right sural and superficial peroneal sensory responses are absent. 4. Right tibial motor response shows reduced amplitude and slowed conduction velocity. The peroneal motor response is absent at the extensor digitorum brevis, and normal at the tibialis anterior. 5. Right tibial H reflex study shows prolonged latency. 6. There is no evidence of active or chronic motor axon loss changes affecting any of the tested muscles in the left upper extremity. 7. Chronic motor axon loss changes are seen affecting the left gastrocnemius and biceps femoris short head muscles, without accompanied active denervation.   Impression: 1. 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  A superimposed S1 radiculopathy affecting the left lower extremity is also likely. 2. There is no evidence of a sensorimotor neuropathy or cervical radiculopathy affecting the left upper extremity.     ___________________________ Nita Sickle, DO -------------------------------- MRI lumbar spine:   TECHNIQUE: Sagittal and axial T1 and T2-weighted sequences were  performed. Additional sagittal STIR images were performed.   INDICATION: Back pain   COMPARISON: None   FINDINGS:  #  5 mm grade 1 anterolisthesis L4-5 with minimal grade 1 anterolisthesis is L5-S1.  #  Vertebral body heights are well maintained.  #  Trace type stress I Modic endplate change L3-4 and type II at L4-5  #  Conus terminates at T12-L1 without evidence of tethering.  #  Nerve roots appear normal.  #  Incidental findings: None.    #  L1-2: Mild degenerative disc disease. Canal is well-maintained. Neuroforamina patent.  #    #  L2-3: Mild degenerative disc disease. Central canal is well-maintained. Neural foramina are patent.  #    #  L3-4: Mild degenerative disc disease. There is facet and disc bulge causing mild central canal stenosis with mild bilateral foraminal narrowing.  #    #  L4-5: Severe degenerative disc disease. Grade 1 anterolisthesis and laminectomy decompression with facet arthropathy and disc uncovering causing mild central canal and lateral recess stenosis and moderate bilateral foraminal narrowing  #    #  L5-S1: Grade 1 anterolisthesis. Facet arthropathy and disc bulge causes mild central canal stenosis and mild bilateral foraminal narrowing.    IMPRESSION:   1.  L4-5 severe degenerative disc disease with grade 1 anterolisthesis and laminectomy decompression. There is  mild residual central canal lateral recess stenosis.   2.  L5-S1 minimal grade 1 anterolisthesis contributes to mild central canal stenosis   Electronically Signed by: Abner Greenspan on 06/10/2021 1:58 PM     Objective:  VS:  HT:    WT:   BMI:     BP:108/64  HR:75bpm  TEMP: ( )  RESP:  Physical Exam Vitals and nursing note reviewed.  Constitutional:      General: He is not in acute distress.    Appearance: Normal appearance. He is not ill-appearing.  HENT:     Head: Normocephalic and atraumatic.     Right Ear: External ear normal.     Left Ear: External ear normal.     Nose:  No congestion.  Eyes:     Extraocular Movements: Extraocular movements intact.  Cardiovascular:     Rate and Rhythm: Normal rate.     Pulses: Normal pulses.  Pulmonary:     Effort: Pulmonary effort is normal. No respiratory distress.  Abdominal:     General: There is no distension.     Palpations: Abdomen is soft.  Musculoskeletal:        General: No tenderness or signs of injury.     Cervical back: Neck supple.     Right lower leg: No edema.     Left lower leg: No edema.     Comments: Patient has good distal strength without clonus.  Skin:    Findings: No erythema or rash.  Neurological:     General: No focal deficit present.     Mental Status: He is alert and oriented to person, place, and time.     Sensory: No sensory deficit.     Motor: No weakness or abnormal muscle tone.     Coordination: Coordination normal.  Psychiatric:        Mood and Affect: Mood normal.        Behavior: Behavior normal.      Imaging: No results found.

## 2022-12-14 NOTE — Procedures (Signed)
Lumbar Facet Joint Nerve Denervation  Patient: Daniel [name]      Date of Birth: 03/29/1933 MRN: [id number] PCP: Geoffry Paradise, MD      Visit Date: 12/14/2022   Universal Protocol:    Date/Time: 07/09/243:35 PM  Consent Given By: the patient  Position: PRONE  Additional Comments: Vital signs were monitored before and after the procedure. Patient was prepped and draped in the usual sterile fashion. The correct patient, procedure, and site was verified.   Injection Procedure Details:   Procedure diagnoses:  1. Spondylosis without myelopathy or radiculopathy, lumbar region      Meds Administered: No orders of the defined types were placed in this encounter.    Laterality: Left  Location/Site:  L4-L5, L3 and L4 medial branches and L5-S1, L4 medial branch and L5 dorsal ramus  Needle: 18 ga.,  10mm active tip, RF Cannula  Needle Placement: Along juncture of superior articular process and transverse pocess  Findings:  -Comments:  Procedure Details: For each desired target nerve, the corresponding transverse process (sacral ala for the L5 dorsal rami) was identified and the fluoroscope was positioned to square off the endplates of the corresponding vertebral body to achieve a true AP midline view.  The beam was then obliqued 15 to 20 degrees and caudally tilted 15 to 20 degrees to line up a trajectory along the target nerves. The skin over the target of the junction of superior articulating process and transverse process (sacral ala for the L5 dorsal rami) was infiltrated with 1ml of 1% Lidocaine without Epinephrine.  The 18 gauge 10mm active tip outer cannula was advanced in trajectory view to the target.  This procedure was repeated for each target nerve.  Then, for all levels, the outer cannula placement was fine-tuned and the position was then confirmed with bi-planar imaging.    Test stimulation was done both at sensory and motor levels to ensure there was no  radicular stimulation. The target tissues were then infiltrated with 1 ml of 1% Lidocaine without Epinephrine. Subsequently, a percutaneous neurotomy was carried out for 90 seconds at 80 degrees Celsius.  After the completion of the lesion, 1 ml of injectate was delivered. It was then repeated for each facet joint nerve mentioned above. Appropriate radiographs were obtained to verify the probe placement during the neurotomy.   Additional Comments:  No complications occurred Dressing: 2 x 2 sterile gauze and Band-Aid    Post-procedure details: Patient was observed during the procedure. Post-procedure instructions were reviewed.  Patient left the clinic in stable condition.

## 2022-12-14 NOTE — Patient Instructions (Signed)

## 2022-12-27 ENCOUNTER — Other Ambulatory Visit: Payer: Self-pay | Admitting: Nurse Practitioner

## 2023-01-07 ENCOUNTER — Telehealth: Payer: Self-pay | Admitting: Physical Medicine and Rehabilitation

## 2023-01-07 NOTE — Telephone Encounter (Signed)
Patient called having severe back pain and needs to know if something else can get done. [phone]

## 2023-02-25 ENCOUNTER — Telehealth: Payer: Self-pay

## 2023-02-25 NOTE — Telephone Encounter (Signed)
...  Pre-operative Risk Assessment    Patient Name: Daniel [name]  DOB: Mar 15, 1933 MRN: [id number]     Request for Surgical Clearance    Procedure:  Dental Extraction - Amount of Teeth to be Pulled:  1  Date of Surgery:  Clearance 02/28/23                                 Surgeon:   dr Carron Curie Surgeon's Group or Practice Name:  [facility] Phone number:  [phone] Fax number:  [phone]   Type of Clearance Requested:   - Medical  - Pharmacy:  Hold Clopidogrel (Plavix)     Type of Anesthesia:  Local    Additional requests/questions:   last appt 11/17/22, next appt 03/08/23  Signed, Renee Ramus   02/25/2023, 10:38 AM

## 2023-02-25 NOTE — Telephone Encounter (Signed)
Patient Name: Daniel [name]  DOB: [birth year] MRN: [id number]  Primary Cardiologist: Bryan Lemma, MD  Chart reviewed as part of pre-operative protocol coverage.   Dental extractions of 1-2 teeth are considered low risk procedures per guidelines and generally do not require any specific cardiac clearance. It is also generally accepted that for extractions of 1-2 teeth and dental cleanings, there is no need to interrupt blood thinner therapy.  SBE prophylaxis is not required for the patient from a cardiac standpoint.  I will route this recommendation to the requesting party via Epic fax function and remove from pre-op pool.  Please call with questions.  Carlos Levering, NP 02/25/2023, 12:10 PM

## 2023-03-08 ENCOUNTER — Ambulatory Visit: Payer: Medicare Other | Attending: Cardiology | Admitting: Cardiology

## 2023-03-08 ENCOUNTER — Encounter: Payer: Self-pay | Admitting: Cardiology

## 2023-03-08 VITALS — BP 124/60 | HR 68 | Ht 75.0 in | Wt 189.6 lb

## 2023-03-08 DIAGNOSIS — I951 Orthostatic hypotension: Secondary | ICD-10-CM

## 2023-03-08 DIAGNOSIS — I25118 Atherosclerotic heart disease of native coronary artery with other forms of angina pectoris: Secondary | ICD-10-CM | POA: Diagnosis not present

## 2023-03-08 DIAGNOSIS — M791 Myalgia, unspecified site: Secondary | ICD-10-CM

## 2023-03-08 DIAGNOSIS — E785 Hyperlipidemia, unspecified: Secondary | ICD-10-CM

## 2023-03-08 DIAGNOSIS — R222 Localized swelling, mass and lump, trunk: Secondary | ICD-10-CM

## 2023-03-08 DIAGNOSIS — R5383 Other fatigue: Secondary | ICD-10-CM | POA: Diagnosis not present

## 2023-03-08 DIAGNOSIS — R0609 Other forms of dyspnea: Secondary | ICD-10-CM | POA: Diagnosis not present

## 2023-03-08 DIAGNOSIS — Z951 Presence of aortocoronary bypass graft: Secondary | ICD-10-CM

## 2023-03-08 DIAGNOSIS — T466X5D Adverse effect of antihyperlipidemic and antiarteriosclerotic drugs, subsequent encounter: Secondary | ICD-10-CM

## 2023-03-08 DIAGNOSIS — R634 Abnormal weight loss: Secondary | ICD-10-CM

## 2023-03-08 DIAGNOSIS — I10 Essential (primary) hypertension: Secondary | ICD-10-CM | POA: Diagnosis not present

## 2023-03-08 MED ORDER — LOSARTAN POTASSIUM 25 MG PO TABS: 25.0000 mg | ORAL_TABLET | Freq: Every morning | ORAL | 3 refills | Status: DC

## 2023-03-08 MED ORDER — NEXLETOL 180 MG PO TABS: 180.0000 mg | ORAL_TABLET | Freq: Every day | ORAL | 3 refills | Status: DC

## 2023-03-08 MED ORDER — METOPROLOL TARTRATE 50 MG PO TABS: ORAL_TABLET | ORAL | Status: DC

## 2023-03-08 NOTE — Progress Notes (Signed)
Cardiology Office Note:  .   Date:  03/12/2023  ID:  Daniel [name], DOB July 12, 1932, MRN [id number] PCP: Geoffry Paradise, MD   HeartCare Providers Cardiologist:  Bryan Lemma, MD     Chief Complaint  Patient presents with   Follow-up    3 months   Coronary Artery Disease    NO angina - just DOE    Patient Profile: .     Daniel [name] is a  89 y.o. male with a PMH reviewed below, who presents as a 68-month follow-up with shortness of breath, a recent weight loss, and a new lump under the left breast at the request of Geoffry Paradise, MD.  Past medical history significant for: CAD. CABG x 3 1997: LIMA to LAD, SVG to OM, SVG to diagonal. LHC 11/04/2004 (angina): Significant LM and three-vessel CAD.  Patent but small LIMA to LAD.  Patent SVG to diagonal and SVG to OM. LHC 11/14/2006 (angina): LM 40%.  Mid LAD 80%.  First OM 99%.  New lesions mid posterior descending 85 and 90%.  LIMA to LAD with 60% distal anastomotic site stenosis.  Patent SVG to diagonal with competitive flow between vein graft to diagonal and i LIMA to LAD.  Patent SVG to OM1.  PCI with BMS 2.25 x 28 mm to mid posterior descending. LHC 04/26/2022 (unstable angina): Two-vessel occlusive CAD.  Continued patency of stent in the PDA.  Patent LIMA to LAD.  Patent SVG to D2.  Patent SVG to OM 2.  Critical stenosis mid SVG.  Successful PCI with DES to SVG to OM 2. Echo 04/26/2022: EF 60 to 65%.  Mild concentric LVH.  Grade I DD.  Mild LAE.  Trivial MR. Labile hypertension. Orthostatic hypotension. Hyperlipidemia.-Statin intolerant.  On Repatha and Zetia Lipid panel 06/09/2022: LDL 113, HDL 33, TG 261, total 191. Palpitations.  Daniel [name] was last seen on November 17, 2022 by Carlos Levering, NP: He contacted the office because of issues with low blood pressure and lightheadedness.  Called back and again on June 3 began with hypotension and lightheadedness.  Also noticed fatigue.  He had only taken a quarter of  metoprolol dose the preceding day and none the day of.  Decided to stop metoprolol.-My instructions have been to take half tablet Lopressor twice daily and half the Avapro and make chlorthalidone every other day.  He was also evaluated for preop lumbar nerve ablation.  Recommended Plavix 5 to 7 days preop     Subjective   INTERVAL HPI Discussed the use of AI scribe software for clinical note transcription with the patient, who gave verbal consent to proceed.  History of Present Illness   Daniel [name] reports that the shortness of breath occurs with any activity and has been present for about six to eight weeks.  He notes that his Energy level has been down & DOE more pronounced.  Denies any Anginal CP - Sx noted prior to Cath -PCI last year. Fatigue & DOE walking ~ 400 feet.  The patient has been managing his hypertension with metoprolol and chlorthalidone, and his heart disease with Plavix and Imdur.     His cholesterol levels have improved but are not at the desired level despite taking Zetia.  His BP levels have improved since reducing doses of meds & actually having stopped irbesartan => cut in 1/2 then 1/4 & finally d/c'd.  => notes SBPs in 120s-130s => Metoprolol increased to 50 mg BID form 25 mg AM & 50  mg PM.  PCP started chlorthalidone - but does not note improvement in DOE.  Still taking Imdur & no significant CP.  The pain he notes having L lateral CP worse with certain movements - feels like he pulled a muscle.  He has also experienced a recent weight loss of five pounds in the last couple of weeks, which he attributes to a recent dental procedure that has affected his eating habits.     Daniel [name] also complains of a new tender lump under the left breast, which has been present for about six weeks and is slightly sore.      Cardiovascular ROS: positive for - dyspnea on exertion and fatigue and exercise intolerance. negative for - chest pain, irregular heartbeat, orthopnea, palpitations,  paroxysmal nocturnal dyspnea, rapid heart rate, or recurrent episodes of syncope or near syncope, TIA or amaurosis fugax ; no claudication; melena, hematochezia, hematuria or epistaxis.  ROS:  Review of Systems - Negative except Sx noted above     Objective   Studies Reviewed: [name]   EKG Interpretation Date/Time:  Tuesday March 08 2023 13:54:43 EDT Ventricular Rate:  71 PR Interval:  190 QRS Duration:  76 QT Interval:  362 QTC Calculation: 393 R Axis:   -6  Text Interpretation: Normal sinus rhythm Minimal voltage criteria for LVH, may be normal variant ( R in aVL ) Inferior infarct , age undetermined When compared with ECG of 27-Apr-2022 06:42, Borderline criteria for Inferior infarct is now Present QT has shortened Confirmed by Bryan Lemma ([id number]) on 03/08/2023 1:59:18 PM     Labs were just checked on September 4: LDL down to 86 with total cholesterol down to 162.  (Previously been 113 and 191.) triglycerides still mildly elevated @ 217.  A1c was 5.9; BUN 25, Cr 1.1.  Normal LFTs H/H 12.1/37.0.  TSH 0.01 & Free T4 1.3.  Echocardiogram 04/26/2022: Ejection fraction 60-65%; GR 1 DD,; normal aortic and mitral valves.  Normal RV size and function.  Normal RAP and RVP. CATH NOV 2023: SYNERGY XD 4.0X16.  Dominance: Right       Intervention   Risk Assessment/Calculations:           Physical Exam:   VS:  BP 124/60   Pulse 68   Ht 6\' 3"  (1.905 m)   Wt 189 lb 9.6 oz (86 kg)   SpO2 98%   BMI 23.70 kg/m    Wt Readings from Last 3 Encounters:  03/08/23 189 lb 9.6 oz (86 kg)  11/17/22 198 lb 9.6 oz (90.1 kg)  10/04/22 196 lb 9.6 oz (89.2 kg)    GEN: Well nourished, well developed in no acute distress; as usual, impeccably well-groomed.  Healthy-appearing. NECK: No JVD; No carotid bruits CARDIAC: Normal S1, S2; RRR, no murmurs, rubs, gallops RESPIRATORY:  Clear to auscultation without rales, wheezing or rhonchi ; nonlabored, good air movement. ABDOMEN: Soft, non-tender,  non-distended EXTREMITIES:  No edema; No deformity  SKIN: Half dollar-sized, fleshy, flat nodule under left breast, tender to palpation     ASSESSMENT AND PLAN: .    Problem List Items Addressed This Visit       Cardiology Problems   Dyslipidemia, goal LDL below 70 (Chronic)    Recent cholesterol levels improved, LDL down to 86 from 113. Currently on Zetia daily. -Add Nexletol to current regimen.      Relevant Medications   Bempedoic Acid (NEXLETOL) 180 MG TABS   metoprolol tartrate (LOPRESSOR) 50 MG tablet   losartan (COZAAR) 25 MG  tablet   Labile essential hypertension (Chronic)    Stable blood pressure on current regimen. Patient has stopped taking Irbesartan and is currently on Metoprolol 50mg  twice daily and Chlorthalidone daily.  -Reduce morning dose of Metoprolol to 25mg  and add Losartan 25mg  daily which is less potent than irbesartan.      Relevant Medications   Bempedoic Acid (NEXLETOL) 180 MG TABS   metoprolol tartrate (LOPRESSOR) 50 MG tablet   losartan (COZAAR) 25 MG tablet   LM-CAD:  LIMA-LAD, patent SVG-D2 & SVG-OM2 (DES PCI) , BMS PCI rPDA (Chronic)    No significant anginal symptoms since his cath back in November 2020.  Still has lack of energy and fatigue.  He did not really notice much difference in reducing dose of beta-blocker back to 50 mg twice daily.  Plan was to actually convert to Toprol 50 daily but that did not happen. Noticing left-sided chest soreness over the last 6 weeks but not worse with exertion, worse with certain movements.  More likely musculoskeletal.   Plan: In the absence of any active angina, can DC Imdur.  Continue beta-blocker as he is no longer on ARB. Not sure why his ARB was discontinued and he is now on chlorthalidone, but for now we will hold off on making changes. Continue chlorthalidonne for potential volume removal. => In next visit, my inclination would be probably to go back to a low-dose irbesartan and stop the  chlorthalidone. Lipids not at goal, will add Nexletol and potentially convert to Nexlizet if tolerated. Continue Plavix monotherapy for maintenance.  No longer on ASA. Okay to hold Plavix 5 to 7 days preop for surgeries or procedures. We can offer 3 days for bleeding.      Relevant Medications   Bempedoic Acid (NEXLETOL) 180 MG TABS   metoprolol tartrate (LOPRESSOR) 50 MG tablet   losartan (COZAAR) 25 MG tablet   Orthostatic hypotension (Chronic)    He is noting dizziness and lightheadedness, hold chlorthalidone      Relevant Medications   Bempedoic Acid (NEXLETOL) 180 MG TABS   metoprolol tartrate (LOPRESSOR) 50 MG tablet   losartan (COZAAR) 25 MG tablet     Other   Abnormal weight loss    Patient reports losing 5 pounds in the past couple of weeks, possibly due to recent dental issues and reduced food intake. -Monitor weight closely.      DOE (dyspnea on exertion) - Primary (Chronic)    Most exertional dyspnea, but seems euvolemic on exam.  Not having active angina. She needs to be related to fatigue.  Plans to be used 1 dose of metoprolol to allow for more heart rate responsiveness.  Adding low-dose ARB for afterload reduction to help with possible exercise related diastolic dysfunction      Fatigue due to treatment (Chronic)    Has done better in the past with reducing beta-blocker dose.  Plan to reduce morning dose of Lopressor to 25 mg daily.  Will add losartan 25 mg for afterload reduction.      Myalgia due to statin (Chronic)    Unable to tolerate statins.  Remains on Zetia and not yet at goal. On Repatha, continue Zetia and adding Nexletol      Nodule of left anterior chest wall   S/P CABG x 3 (Chronic)    Has been stable since catheterization in November 2023 => All 3 grafts patent with PCI to SVG-OM1; native RCA with patent PDA stent.  Would probably not do screening stress test  unless symptoms warrant.       Other Visit Diagnoses     Essential hypertension        Relevant Medications   Bempedoic Acid (NEXLETOL) 180 MG TABS   metoprolol tartrate (LOPRESSOR) 50 MG tablet   losartan (COZAAR) 25 MG tablet   Other Relevant Orders   EKG 12-Lead (Completed)              Dispo: Return in about 6 months (around 09/06/2023).  Total time spent: 29 min spent with patient + 18 min spent charting = 47 min      Signed, Marykay Lex, MD, MS Bryan Lemma, M.D., M.S. Interventional Cardiologist  [facility]  Pager # [phone] Phone # [phone] [address] [place], [place] [address]

## 2023-03-08 NOTE — Patient Instructions (Addendum)
Medication Instructions:   Start taking Metoprolol  tartrate 25 mg ( 1/2 tablet of 50 mg ) in the morning  and 50 mg in the evening  Also take  Losartan 25 mg  ( new) and  Chlorthalidone  in the morning.  Start Nexletol 180 mg  one tablet daily    *If you need a refill on your cardiac medications before your next appointment, please call your pharmacy*   Lab Work:  Not needed   Testing/Procedures:  Not needed  Follow-Up: At [facility], you and your health needs are our priority.  As part of our continuing mission to provide you with exceptional heart care, we have created designated Provider Care Teams.  These Care Teams include your primary Cardiologist (physician) and Advanced Practice Providers (APPs -  Physician Assistants and Nurse Practitioners) who all work together to provide you with the care you need, when you need it.     Your next appointment:   6 month(s)  The format for your next appointment:   In Person  Provider:   Bryan Lemma, MD    Other Instructions    Follow up  with Dr Jacky Kindle  about the nodule under left breast area

## 2023-03-09 ENCOUNTER — Other Ambulatory Visit (HOSPITAL_COMMUNITY): Payer: Self-pay

## 2023-03-09 ENCOUNTER — Telehealth: Payer: Self-pay | Admitting: Pharmacy Technician

## 2023-03-09 NOTE — Telephone Encounter (Signed)
Pharmacy Patient Advocate Encounter   Received notification from Fax that prior authorization for nexletol is required/requested.   Insurance verification completed.   The patient is insured through [facility] .   Per test claim: PA required; PA submitted to [facility] via CoverMyMeds Key/confirmation #/[id number] Status is pending

## 2023-03-09 NOTE — Telephone Encounter (Signed)
Pharmacy Patient Advocate Encounter  Received notification from [facility] that Prior Authorization for nexletol  has been APPROVED from 03/09/23 to 09/07/23. Ran test claim, Copay is $47.00---  One month supply. This test claim was processed through [facility]- copay amounts may vary at other pharmacies due to pharmacy/plan contracts, or as the patient moves through the different stages of their insurance plan.   PA #/Case ID/Reference #: [id number]

## 2023-03-10 ENCOUNTER — Telehealth: Payer: Self-pay | Admitting: *Deleted

## 2023-03-10 NOTE — Telephone Encounter (Signed)
Per pharmacy prior auth team : PA request has been Approved. New Encounter created for follow up. For additional info see Pharmacy Prior Auth telephone encounter from 03/09/23.     Called left detail message to patient . Medication has been approved by his insurance . He should be able to pick up from  pharmacy Any question may call back.

## 2023-03-12 ENCOUNTER — Encounter: Payer: Self-pay | Admitting: Cardiology

## 2023-03-12 DIAGNOSIS — R222 Localized swelling, mass and lump, trunk: Secondary | ICD-10-CM | POA: Insufficient documentation

## 2023-03-12 DIAGNOSIS — R634 Abnormal weight loss: Secondary | ICD-10-CM | POA: Insufficient documentation

## 2023-03-12 NOTE — Assessment & Plan Note (Signed)
Unable to tolerate statins.  Remains on Zetia and not yet at goal. On Repatha, continue Zetia and adding Nexletol

## 2023-03-12 NOTE — Assessment & Plan Note (Addendum)
Has been stable since catheterization in November 2023 => All 3 grafts patent with PCI to SVG-OM1; native RCA with patent PDA stent.  Would probably not do screening stress test unless symptoms warrant.

## 2023-03-12 NOTE — Assessment & Plan Note (Addendum)
No significant anginal symptoms since his cath back in November 2020.  Still has lack of energy and fatigue.  He did not really notice much difference in reducing dose of beta-blocker back to 50 mg twice daily.  Plan was to actually convert to Toprol 50 daily but that did not happen. Noticing left-sided chest soreness over the last 6 weeks but not worse with exertion, worse with certain movements.  More likely musculoskeletal.   Plan: In the absence of any active angina, can DC Imdur.  Continue beta-blocker as he is no longer on ARB. Not sure why his ARB was discontinued and he is now on chlorthalidone, but for now we will hold off on making changes. Continue chlorthalidonne for potential volume removal. => In next visit, my inclination would be probably to go back to a low-dose irbesartan and stop the chlorthalidone. Lipids not at goal, will add Nexletol and potentially convert to Nexlizet if tolerated. Continue Plavix monotherapy for maintenance.  No longer on ASA. Okay to hold Plavix 5 to 7 days preop for surgeries or procedures. We can offer 3 days for bleeding.

## 2023-03-12 NOTE — Assessment & Plan Note (Signed)
Patient reports losing 5 pounds in the past couple of weeks, possibly due to recent dental issues and reduced food intake. -Monitor weight closely.

## 2023-03-12 NOTE — Assessment & Plan Note (Signed)
Has done better in the past with reducing beta-blocker dose.  Plan to reduce morning dose of Lopressor to 25 mg daily.  Will add losartan 25 mg for afterload reduction.

## 2023-03-12 NOTE — Assessment & Plan Note (Addendum)
Stable blood pressure on current regimen. Patient has stopped taking Irbesartan and is currently on Metoprolol 50mg  twice daily and Chlorthalidone daily.  -Reduce morning dose of Metoprolol to 25mg  and add Losartan 25mg  daily which is less potent than irbesartan.

## 2023-03-12 NOTE — Assessment & Plan Note (Signed)
Recent cholesterol levels improved, LDL down to 86 from 113. Currently on Zetia daily. -Add Nexletol to current regimen.

## 2023-03-12 NOTE — Assessment & Plan Note (Signed)
Most exertional dyspnea, but seems euvolemic on exam.  Not having active angina. She needs to be related to fatigue.  Plans to be used 1 dose of metoprolol to allow for more heart rate responsiveness.  Adding low-dose ARB for afterload reduction to help with possible exercise related diastolic dysfunction

## 2023-03-12 NOTE — Assessment & Plan Note (Signed)
He is noting dizziness and lightheadedness, hold chlorthalidone

## 2023-04-04 ENCOUNTER — Other Ambulatory Visit: Payer: Self-pay | Admitting: Cardiology

## 2023-04-06 ENCOUNTER — Other Ambulatory Visit: Payer: Self-pay | Admitting: Nurse Practitioner

## 2023-06-04 ENCOUNTER — Other Ambulatory Visit: Payer: Self-pay | Admitting: Cardiology

## 2023-06-06 ENCOUNTER — Telehealth: Payer: Self-pay | Admitting: Physical Medicine and Rehabilitation

## 2023-06-06 NOTE — Telephone Encounter (Signed)
Patient called and wants to get an appointment for an abrasion. [phone]

## 2023-06-07 ENCOUNTER — Telehealth: Payer: Self-pay | Admitting: Cardiology

## 2023-06-07 MED ORDER — METOPROLOL TARTRATE 25 MG PO TABS: ORAL_TABLET | ORAL | 3 refills | Status: DC

## 2023-06-07 NOTE — Telephone Encounter (Signed)
 RN spoke to patient. Patient has ran out of current 50 mg tablets, per last office visit with Dr Anner.  - patient was informed to contact office once he was ready to switch to a 25 mg  tablets . This will make it easier for patient so he does not  have to split medication in half.  Patient aware new medication has been e-sent to pharamcy

## 2023-06-07 NOTE — Telephone Encounter (Signed)
*  STAT* If patient is at the pharmacy, call can be transferred to refill team.   1. Which medications need to be refilled? (please list name of each medication and dose if known)  metoprolol  tartrate (LOPRESSOR ) 25 MG tablet - Patient would like the 25 MG tablet instead of 50 MG if possible.  2. Which pharmacy/location (including street and city if local pharmacy) is medication to be sent to? [id number] - [place], [place] - [address]  3. Do they need a 30 day or 90 day supply?  90 day supply

## 2023-06-13 ENCOUNTER — Telehealth: Payer: Self-pay | Admitting: Radiology

## 2023-06-13 NOTE — Telephone Encounter (Signed)
 Patient left voicemail that he is in severe pain and is requesting appointment with Dr. Alvester Morin.  CB [phone]

## 2023-06-14 ENCOUNTER — Telehealth: Payer: Self-pay | Admitting: Physical Medicine and Rehabilitation

## 2023-06-14 NOTE — Telephone Encounter (Signed)
Patient called. Returning a call to [employer]

## 2023-06-22 ENCOUNTER — Encounter: Payer: Self-pay | Admitting: Physical Medicine and Rehabilitation

## 2023-06-22 ENCOUNTER — Ambulatory Visit: Payer: Medicare Other | Admitting: Physical Medicine and Rehabilitation

## 2023-06-22 DIAGNOSIS — M545 Low back pain, unspecified: Secondary | ICD-10-CM

## 2023-06-22 DIAGNOSIS — M961 Postlaminectomy syndrome, not elsewhere classified: Secondary | ICD-10-CM

## 2023-06-22 DIAGNOSIS — M47816 Spondylosis without myelopathy or radiculopathy, lumbar region: Secondary | ICD-10-CM

## 2023-06-22 DIAGNOSIS — M7918 Myalgia, other site: Secondary | ICD-10-CM

## 2023-06-22 DIAGNOSIS — G8929 Other chronic pain: Secondary | ICD-10-CM

## 2023-06-22 NOTE — Progress Notes (Signed)
Patient stated it helped on the L side not on the R side. He just wants to discuss the next steps on what to do from here.

## 2023-06-22 NOTE — Progress Notes (Signed)
Daniel [name] - 89 y.o. male MRN [id number]  Date of birth: 01/07/1933  Office Visit Note: Visit Date: 06/22/2023 PCP: Geoffry Paradise, MD Referred by: Geoffry Paradise, MD  Subjective: Chief Complaint  Patient presents with   Lower Back - Pain   HPI: Daniel [name] is a 89 y.o. male who comes in today for evaluation of chronic, worsening and severe pain to right buttock region. Patient is well known to [place], we have seen him for primarily facet mediated lower back pain.  Right buttock pain ongoing intermittently for several months, his pain seem to increase after he underwent bilateral L4-L5 and L5-S1 radiofrequency ablation in our office in June 2024.  His pain becomes worse with standing, walking and performing household tasks.  His pain does significantly alleviate with sitting and laying down.  Some relief of pain with home exercise regimen, rest and use of medications.  He continues chronic pain management with his primary care provider Dr. Geoffry Paradise, he does take Tramadol as needed. Lumbar MRI from January of 2023 exhibits previous laminectomy decompression at L4-L5, there is also grade 1 anterolisthesis, facet arthropathy, mild central canal stenosis and lateral recess/foraminal narrowing noted at this level. There is facet arthropathy and mild spinal canal stenosis noted at L5-S1. Patient does have a history of central decompressive laminectomy at the level of L4-L5 in 2010 by Dr. Vira Browns.  Patient is here today to discuss continued right buttock pain and treatment options.  Patient denies focal weakness, numbness and tingling.  He does use cane to assist with ambulation, no recent trauma or falls.      Review of Systems  Musculoskeletal:  Positive for myalgias.  Neurological:  Negative for tingling, sensory change, focal weakness and weakness.  All other systems reviewed and are negative.  Otherwise per HPI.  Assessment & Plan: Visit Diagnoses:    ICD-10-CM   1.  Chronic buttock pain  M79.18    G89.29     2. Myofascial pain syndrome  M79.18     3. Chronic bilateral low back pain without sciatica  M54.50    G89.29     4. Facet hypertrophy of lumbar region  M47.816     5. Post laminectomy syndrome  M96.1        Plan: Findings:  Chronic, worsening and severe right buttock pain.  No pain noted to lower back or radiating down the legs.  Patient continues to have severe pain despite good conservative therapy such as home exercise regimen, rest and use of medications.  Patient's clinical presentation and exam today are complex, differentials include myofascial pain syndrome versus facet mediated pain.  There is palpable taut band/trigger point to right gluteal region upon exam today.  This area is tender upon palpation.  We discussed treatment plan in detail and I elected to perform myofascial trigger point injection to right gluteal area in the office today.  Patient tolerated injection without difficulty.  I would like for him to let us know how he is feeling in the next several days, if myofascial trigger point injection does provide significant relief would recommend that he undergo a short course of dry needling with our physical therapy team.  Should his pain persist we could look at repeating right-sided radiofrequency ablation.  Dr. Alvester Morin did review imaging from right sided ablation in June, it appears there is a fairly large joint on the right at L4-L5.  Patient has no questions at this time.  We will see him  back as needed.  No red flag symptoms noted on exam today.    Meds & Orders: No orders of the defined types were placed in this encounter.   Orders Placed This Encounter  Procedures   Trigger Point Inj    Follow-up: Return if symptoms worsen or fail to improve.   Procedures: Trigger Point Inj  Date/Time: 06/22/2023 11:20 AM  Performed by: Juanda Chance, NP Authorized by: Juanda Chance, NP   Consent Given by:   Patient Indications:  Pain Total # of Trigger Points:  1 Location: back   Needle Size:  25 G Approach:  Dorsal Medications #1:  1 mL lidocaine 1 %; 40 mg triamcinolone acetonide 40 MG/ML Comments: Myofascial trigger point injection to right gluteal region, needling technique utilized.        Clinical History: 05/19/2017 NCV & EMG Findings: Extensive electrodiagnostic testing of the left upper and lower extremity shows:  1. Right median, radial, and ulnar sensory responses are within normal limits. 2. Right median and ulnar motor responses are within normal limits. 3. Right sural and superficial peroneal sensory responses are absent. 4. Right tibial motor response shows reduced amplitude and slowed conduction velocity. The peroneal motor response is absent at the extensor digitorum brevis, and normal at the tibialis anterior. 5. Right tibial H reflex study shows prolonged latency. 6. There is no evidence of active or chronic motor axon loss changes affecting any of the tested muscles in the left upper extremity. 7. Chronic motor axon loss changes are seen affecting the left gastrocnemius and biceps femoris short head muscles, without accompanied active denervation.   Impression: 1. 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  A superimposed S1 radiculopathy affecting the left lower extremity is also likely. 2. There is no evidence of a sensorimotor neuropathy or cervical radiculopathy affecting the left upper extremity.     ___________________________ Nita Sickle, DO -------------------------------- MRI lumbar spine:   TECHNIQUE: Sagittal and axial T1 and T2-weighted sequences were performed. Additional sagittal STIR images were performed.   INDICATION: Back pain   COMPARISON: None   FINDINGS:  #  5 mm grade 1 anterolisthesis L4-5 with minimal grade 1 anterolisthesis is L5-S1.  #   Vertebral body heights are well maintained.  #  Trace type stress I Modic endplate change L3-4 and type II at L4-5  #  Conus terminates at T12-L1 without evidence of tethering.  #  Nerve roots appear normal.  #  Incidental findings: None.    #  L1-2: Mild degenerative disc disease. Canal is well-maintained. Neuroforamina patent.  #    #  L2-3: Mild degenerative disc disease. Central canal is well-maintained. Neural foramina are patent.  #    #  L3-4: Mild degenerative disc disease. There is facet and disc bulge causing mild central canal stenosis with mild bilateral foraminal narrowing.  #    #  L4-5: Severe degenerative disc disease. Grade 1 anterolisthesis and laminectomy decompression with facet arthropathy and disc uncovering causing mild central canal and lateral recess stenosis and moderate bilateral foraminal narrowing  #    #  L5-S1: Grade 1 anterolisthesis. Facet arthropathy and disc bulge causes mild central canal stenosis and mild bilateral foraminal narrowing.    IMPRESSION:   1.  L4-5 severe degenerative disc disease with grade 1 anterolisthesis and laminectomy decompression. There is mild residual central canal lateral recess stenosis.   2.  L5-S1 minimal grade 1 anterolisthesis  contributes to mild central canal stenosis   Electronically Signed by: Abner Greenspan on 06/10/2021 1:58 PM   He reports that he has never smoked. He has never used smokeless tobacco. No results for input(s): "HGBA1C", "LABURIC" in the last 8760 hours.  Objective:  VS:  HT:    WT:   BMI:     BP:   HR: bpm  TEMP: ( )  RESP:  Physical Exam Vitals and nursing note reviewed.  HENT:     Head: Normocephalic and atraumatic.     Right Ear: External ear normal.     Left Ear: External ear normal.     Nose: Nose normal.     Mouth/Throat:     Mouth: Mucous membranes are moist.  Eyes:     Extraocular Movements: Extraocular movements intact.  Cardiovascular:     Rate and Rhythm: Normal rate.      Pulses: Normal pulses.  Pulmonary:     Effort: Pulmonary effort is normal.  Abdominal:     General: Abdomen is flat. There is no distension.  Musculoskeletal:        General: Tenderness present.     Cervical back: Normal range of motion.     Comments: Patient rises from seated position to standing without difficulty. Good lumbar range of motion. No pain noted with facet loading. 5/5 strength noted with bilateral hip flexion, knee flexion/extension, ankle dorsiflexion/plantarflexion and EHL. No clonus noted bilaterally. No pain upon palpation of greater trochanters. No pain with internal/external rotation of bilateral hips. Sensation intact bilaterally. Tender palpable taut band/trigger point to right gluteal region. Negative slump test bilaterally. Ambulates with cane, gait slow and unsteady.   Skin:    General: Skin is warm and dry.     Capillary Refill: Capillary refill takes less than 2 seconds.  Neurological:     General: No focal deficit present.     Mental Status: He is alert and oriented to person, place, and time.  Psychiatric:        Mood and Affect: Mood normal.        Behavior: Behavior normal.     Ortho Exam  Imaging: No results found.  Past Medical/Family/Surgical/Social History: Medications & Allergies reviewed per EMR, new medications updated. Patient Active Problem List   Diagnosis Date Noted   Abnormal weight loss 03/12/2023   Nodule of left anterior chest wall 03/12/2023   Myalgia due to statin 05/22/2021   Branch retinal vein occlusion of left eye with macular edema 09/16/2020   Chronic pain of right knee 03/19/2020   Hypertensive retinopathy of both eyes 11/13/2017   Stenosis of cervical spine with myelopathy (HCC) 07/14/2017   Pseudophakia of both eyes 05/24/2017   Stable branch retinal vein occlusion of right eye 05/24/2017   DOE (dyspnea on exertion) 03/10/2017   Acute maxillary sinusitis 02/15/2017   Allergic rhinitis 02/15/2017   Preoperative  cardiovascular examination 08/16/2016   Chronic pain of both knees 07/13/2016   Fatigue due to treatment 09/11/2015   Unstable angina (HCC) 08/06/2015   Low TSH level 08/13/2014   Orthostatic hypotension 12/17/2013   Pseudoptosis 05/22/2013   Dermatochalasis 02/21/2013   Epiphora 02/21/2013   S/P CABG x 3    Labile essential hypertension    Dyslipidemia, goal LDL below 70    Heart palpitations    Nuclear cataract 01/26/2012   PCO (posterior capsular opacification) 01/26/2012   Pseudophakia 01/26/2012   LM-CAD:  LIMA-LAD, patent SVG-D2 & SVG-OM2 (DES PCI) , BMS PCI rPDA 07/27/1995  Past Medical History:  Diagnosis Date   Arthritis    CAD of autologous arterial graft 2010   a) ~2010 & 2017 - LIMA-LAD was atretic (LAD filling via SVG-D2); b)  04/2022: LIMA-LAD now fully patent, 85% mSVG-OM2 =? DES PCI   CAD S/P percutaneous coronary angioplasty 11/2006   a. 6/'08: PCI nongrafted distal RCA-PDA: Mini vision BMS 2.25 mm x 28 mm; b. Relook Cath 08/2015: stable,: atretic LIMA;; 04/2022: 85% SVG-OM2  => DES PCI Synergy XD 4.0 mmx16 mm); LM 60%, pLAD 70%, p-m LAD 100% & p-m LCX 100%; 40% dRCA with patent rPDA BMS; SVG-Diag & LIMA-LAD patent   Complication of anesthesia    Coronary artery disease involving left main coronary artery [id number]   Last cath 2010: 70-80 % ostial left main, 80-90% LAD, subtotal CTO LCx-OM2, RCA patent w/ patent BMS stent (dRCA-PDA); SVG-diagonal backfills LAD, atretic LIMA-LAD -- medical therapy; stable by relook cath in March 2017   Difficult intubation    VERY NARROW AIRWAY PER ANESTHESIOLOGIST (1997 CABG AT [facility])   Dyslipidemia, goal LDL below 70    Dysrhythmia    Heart palpitations    Never fully diagnosed.   History of kidney stones    Hypertension    Insomnia    Mass in chest    JUST WATCHING , WILL CHECK OUT AFTER SURGERY   Pneumonia    S/P CABG x 3 1997   LIMA-LAD, SVG-D2, SVG-OM --> LIMA known to be atretic, but SVG-D2 fills diagonal and LAD    Stenosis of cervical spine with myelopathy (HCC)    Tingling    in fingers   Family History  Problem Relation Age of Onset   Stroke Mother 26   Heart disease Mother    Arthritis Mother    Heart attack Father 66   Other Sister    Heart disease Brother    Allergic rhinitis Neg Hx    Atopy Neg Hx    Angioedema Neg Hx    Asthma Neg Hx    Eczema Neg Hx    Immunodeficiency Neg Hx    Urticaria Neg Hx    Past Surgical History:  Procedure Laterality Date   48-HOUR MONITOR  03/2017   Relatively normal.  Very infrequent ectopy.  Short PAT run with occasional PACs/PVCs and bigeminy.  No symptoms noted on monitor.  Longest PAT runs were less than 10 beats.   BACK SURGERY     decompression  lower back    CARDIAC CATHETERIZATION N/A 08/08/2015   Procedure: Left Heart Cath and Cors/Grafts Angiography;  Surgeon: Marykay Lex, MD;  Location: [facility] INVASIVE CV LAB;; -->  LM 80%-70%. Ost LAD 80% then prox-mid 100% (after small D1). Small LCx & OM2 patent with 100% OM2. Prox & distal RCA 20%, patent BMS in distal RCA-rPDA.  Patent/atretic LIMA-dLAD, SVG-D2 (backdills entire dLAD) & SVG-OM2.    COLONOSCOPY     CORONARY ANGIOPLASTY WITH STENT PLACEMENT  11/2006   BMS PCI of distal RCA-RPDA: PCI with a 2.25 mm x 28 mm Mini Vision bare-metal stent.   CORONARY ARTERY BYPASS GRAFT  [id number]   LIMA - LAD, SVG- diagonal, SVG-OM   CORONARY STENT INTERVENTION N/A 04/26/2022   Procedure: CORONARY STENT INTERVENTION;  Surgeon: Swaziland, Peter M, MD;  Location: [facility] INVASIVE CV LAB;  Service: CV: 95% mid SVG-OM2 => DES PCI (Synergy XD DES 4.0 mm x 16 mm) -> complicated by transient no reflow resolved with IC verapamil   EYE SURGERY  BIL CATARACT   KIDNEY STONE SURGERY     KNEE ARTHROSCOPY     RT  KNEE   LEFT HEART CATH AND CORS/GRAFTS ANGIOGRAPHY  11/2006   High-grade distal RCA and PDA stenosis --> BMS PCI.    LEFT HEART CATH AND CORS/GRAFTS ANGIOGRAPHY  2010   Native coronaries at 70% to 80% ostial left  main. LAD had diffuse 80% to 90% stenosis   LEFT HEART CATH AND CORS/GRAFTS ANGIOGRAPHY N/A 04/26/2022   Procedure: LEFT HEART CATH AND CORS/GRAFTS ANGIOGRAPHY;  Surgeon: Swaziland, Peter M, MD;  Location: [facility] INVASIVE CV LAB;  Service: CV::   2 V Occlusive Native CAD: 60% LM, 70% ostial-prox LAD, 100% mid LAD, 1 100% prox-mid LCx; 40% distRCA with patent PDA stent.  Patent LIMA-LAD, SVG-D2. 95% mid SVG-OM2 => DES PCI   NM MYOVIEW LTD  03/17/2017   No significant reversible ischemia. Increased TID ratio 1.34. LVEF 54% with normal wall motion. This is a low risk study.   POSTERIOR CERVICAL FUSION/FORAMINOTOMY N/A 07/14/2017   Procedure: LAMINECTOMY CERVICAL THREE- CERVICAL FOUR WITH LATERAL MASS FUSION;  Surgeon: Lisbeth Renshaw, MD;  Location: [facility] OR;  Service: Neurosurgery;  Laterality: N/A;  LAMINECTOMY CERVICAL 3- CERVICAL 4 WITH LATERAL MASS FUSION   TOTAL KNEE ARTHROPLASTY Right 04/07/2020   Procedure: TOTAL KNEE ARTHROPLASTY;  Surgeon: Dannielle Huh, MD;  Location: [facility] ORS;  Service: Orthopedics;  Laterality: Right;   TRANSTHORACIC ECHOCARDIOGRAM  04/26/2022   EF 60 to 65%, normal LV function, no RWMA, mild concentric LVH, G1 DD, normal RV systolic function, no significant valvular abnormalities.   Social History   Occupational History   Occupation: retired Psychologist, educational  Tobacco Use   Smoking status: Never   Smokeless tobacco: Never  Vaping Use   Vaping status: Never Used  Substance and Sexual Activity   Alcohol use: No   Drug use: No   Sexual activity: Not on file

## 2023-06-27 ENCOUNTER — Telehealth: Payer: Self-pay | Admitting: Physical Medicine and Rehabilitation

## 2023-06-27 MED ORDER — TRIAMCINOLONE ACETONIDE 40 MG/ML IJ SUSP
40.0000 mg | INTRAMUSCULAR | Status: AC | PRN
  Administered 2023-06-22: 40 mg via INTRAMUSCULAR

## 2023-06-27 MED ORDER — LIDOCAINE HCL 1 % IJ SOLN
1.0000 mL | INTRAMUSCULAR | Status: AC | PRN
  Administered 2023-06-22: 1 mL

## 2023-06-27 NOTE — Telephone Encounter (Signed)
Patient states the pain is much better than what it was, to nearly no pain at all. Patient what's to know what's the next step moving forward

## 2023-08-10 ENCOUNTER — Other Ambulatory Visit (HOSPITAL_COMMUNITY): Payer: Self-pay

## 2023-08-10 ENCOUNTER — Telehealth: Payer: Self-pay | Admitting: Pharmacy Technician

## 2023-08-10 NOTE — Telephone Encounter (Signed)
 Pharmacy Patient Advocate Encounter   Received notification from CoverMyMeds that prior authorization for Nexletol is required/requested.   Insurance verification completed.   The patient is insured through [employer] .   Per test claim: The current 08/10/23 day co-pay is, $396.00- 3 months.  No PA needed at this time. This test claim was processed through [facility]- copay amounts may vary at other pharmacies due to pharmacy/plan contracts, or as the patient moves through the different stages of their insurance plan.    PA on file extended 06/06/24

## 2023-08-31 ENCOUNTER — Ambulatory Visit: Payer: Medicare Other | Attending: Cardiology | Admitting: Cardiology

## 2023-08-31 ENCOUNTER — Encounter: Payer: Self-pay | Admitting: Cardiology

## 2023-08-31 VITALS — BP 138/78 | HR 71 | Ht 75.0 in | Wt 194.0 lb

## 2023-08-31 DIAGNOSIS — I25118 Atherosclerotic heart disease of native coronary artery with other forms of angina pectoris: Secondary | ICD-10-CM

## 2023-08-31 DIAGNOSIS — R0609 Other forms of dyspnea: Secondary | ICD-10-CM

## 2023-08-31 DIAGNOSIS — R002 Palpitations: Secondary | ICD-10-CM

## 2023-08-31 DIAGNOSIS — I951 Orthostatic hypotension: Secondary | ICD-10-CM

## 2023-08-31 DIAGNOSIS — M791 Myalgia, unspecified site: Secondary | ICD-10-CM | POA: Diagnosis not present

## 2023-08-31 DIAGNOSIS — T466X5A Adverse effect of antihyperlipidemic and antiarteriosclerotic drugs, initial encounter: Secondary | ICD-10-CM

## 2023-08-31 DIAGNOSIS — E785 Hyperlipidemia, unspecified: Secondary | ICD-10-CM

## 2023-08-31 DIAGNOSIS — R600 Localized edema: Secondary | ICD-10-CM

## 2023-08-31 DIAGNOSIS — I10 Essential (primary) hypertension: Secondary | ICD-10-CM

## 2023-08-31 MED ORDER — METOPROLOL TARTRATE 50 MG PO TABS: 50.0000 mg | ORAL_TABLET | Freq: Two times a day (BID) | ORAL | 3 refills | Status: DC

## 2023-08-31 NOTE — Patient Instructions (Addendum)
 Medication Instructions:   Change  Metoprolol tartrate to 50 mg twice a day   The following medication were removed from your list: Losartan Imdur Repatha Chlorthalidone    In 2 weeks  restart taking Nextletol    *If you need a refill on your cardiac medications before your next appointment, please call your pharmacy*   Lab Work:  Not needed.   Testing/Procedures:  Not needed  Follow-Up: At [facility], you and your health needs are our priority.  As part of our continuing mission to provide you with exceptional heart care, we have created designated Provider Care Teams.  These Care Teams include your primary Cardiologist (physician) and Advanced Practice Providers (APPs -  Physician Assistants and Nurse Practitioners) who all work together to provide you with the care you need, when you need it.     Your next appointment:   6 month(s)  The format for your next appointment:   In Person  Provider:   Bryan Lemma, MD    Other Instructions   s

## 2023-08-31 NOTE — Progress Notes (Signed)
 Cardiology Office Note:  .   Date:  09/03/2023  ID:  Daniel [name], DOB 11-29-1932, MRN [id number] PCP: Geoffry Paradise, MD  [facility] Providers Cardiologist:  Bryan Lemma, MD     Chief Complaint  Patient presents with   Follow-up    Overall doing okay.  Having more palpitations with reduced dose of beta-blocker.  Mild swelling.  Still has some exertional dyspnea but no angina   Coronary Artery Disease    Patient Profile: .     Daniel [name] is a  89 y.o. male with a PMH noted below who presents here for 12-month follow-up at the request of Geoffry Paradise, MD.  Past medical history significant for: CAD. CABG x 3 1997: LIMA to LAD, SVG to OM, SVG to diagonal. LHC 11/04/2004 (angina): Significant LM and three-vessel CAD.  Patent but small LIMA to LAD.  Patent SVG to diagonal and SVG to OM. LHC 11/14/2006 (angina): LM 40%.  Mid LAD 80%.  First OM 99%.  New lesions mid posterior descending 85 and 90%.  LIMA to LAD with 60% distal anastomotic site stenosis.  Patent SVG to diagonal with competitive flow between vein graft to diagonal and i LIMA to LAD.  Patent SVG to OM1.  PCI with BMS 2.25 x 28 mm to mid posterior descending. LHC 04/26/2022 (unstable angina): Two-vessel occlusive CAD.  Continued patency of stent in the PDA.  Patent LIMA to LAD.  Patent SVG to D2.  Patent SVG to OM 2.  Critical stenosis mid SVG.  Successful PCI with DES to SVG to OM 2. Echo 04/26/2022: EF 60 to 65%.  Mild concentric LVH.  Grade I DD.  Mild LAE.  Trivial MR. Labile hypertension. Orthostatic hypotension. Hyperlipidemia.-Statin intolerant.  On Repatha and Zetia Lipid panel 06/09/2022: LDL 113, HDL 33, TG 261, total 191. Palpitations.     Shahmeer Bunn Highland was last seen on March 08, 2023 Added Nexletol added plan to convert from Lopressor to Toprol 50 mg daily  Subjective  Discussed the use of AI scribe software for clinical note transcription with the patient, who gave verbal consent to  proceed.  History of Present Illness   [name] "Daniel [name]" is a 89 year old male with hypertension and coronary artery disease who presents with palpitations and medication review.  He experiences episodes of palpitations, described as his heart racing and beating irregularly, occurring three times over the past month and a half. These episodes typically happen in the morning after getting up and are relieved by resting. His heart rate was over 100 during these episodes. Since increasing his metoprolol dose to 100 mg daily (50 mg twice a day), he has not experienced further episodes.  No chest pain or tightness is reported, but there is persistent shortness of breath, exacerbated by physical activity. He gets winded quickly but notes some improvement compared to the past when he would be exhausted after walking to the mailbox. No new or worsening shortness of breath, orthopnea, or paroxysmal nocturnal dyspnea is noted.  He has a history of hypertension and is currently not taking losartan or chlorthalidone. Home blood pressure readings are typically in the 130s. He is taking metoprolol 50 mg twice daily, Plavix, and a daily aspirin. He previously took irbesartan, which was discontinued due to fluctuating blood pressures.  He mentions a past issue with a lump in his chest, which resolved after discontinuing Noxafil and a water pill. A sonogram identified the lump as a mass, but it  was not considered concerning. He is not currently taking Noxafil or Repatha. He takes clindamycin for sinus issues, as well as Claritin and Singulair. He denies taking isosorbide mononitrate or Repatha, despite having a prescription for the latter.  He experiences some tightness in his feet but denies significant swelling in his legs. He has a sore on his foot that he attributes to scratching, which he treats with Neosporin. He has neuropathy and does not feel much in his feet, sometimes not noticing when they are injured.          Objective   Meds: CV: Plavix 75 mg daily, Toprol tartrate 25 mg every morning, 50 mg every afternoon-he is currently taking 50 mg twice daily; Zetia 10 mg daily  Not currently taking but listed: Losartan 25 mg daily, Imdur 15 mg daily, chlorthalidone 25 mg daily; Nexletol 180 mg daily Repatha 140 mg twice Vitamin B12, Singulair 10 mg daily, Flomax 0.4 mg nightly PRN tramadol 100 mg for pain and as needed zolpidem 10 mg for sleep  Studies Reviewed: [name]        No new studies Previous Studies: Echocardiogram 04/26/2022: Ejection fraction 60-65%; GR 1 DD,; normal aortic and mitral valves.  Normal RV size and function.  Normal RAP and RVP. CATH NOV 2023: Severe two-vessel CAD: Ostial LM 60%, ostial and proximal LAD 70% and proximal to mid LAD 100%, proximal-mid LCx 100%.  Continued patency of PDA stent 40% distal RCA stenosis.  Patent LIMA-LAD and SVG-D2.  95% SVG-OM=> DES PCI with SYNERGY XD 4.0X16.   Labs from PCP via [facility]: 02/09/2023: TC 160, TG 217, HDL 33, LDL 86. 08/15/2023: A1c 5.8 Lab Results  Component Value Date   CHOL 191 06/09/2022   HDL 33 (L) 06/09/2022   LDLCALC 113 (H) 06/09/2022   TRIG 261 (H) 06/09/2022   CHOLHDL 5.8 (H) 06/09/2022   Lab Results  Component Value Date   NA 139 06/09/2022   K 5.0 06/09/2022   CREATININE 1.10 06/09/2022   EGFR 65 06/09/2022   GLUCOSE 108 (H) 06/09/2022     Risk Assessment/Calculations:             Physical Exam:   VS:  BP 138/78   Pulse 71   Ht 6\' 3"  (1.905 m)   Wt 194 lb (88 kg)   SpO2 98%   BMI 24.25 kg/m    Wt Readings from Last 3 Encounters:  08/31/23 194 lb (88 kg)  03/08/23 189 lb 9.6 oz (86 kg)  11/17/22 198 lb 9.6 oz (90.1 kg)    GEN:  Healthy-appearing. Well nourished, well-groomed; no acute distress; well-groomed.  NECK: No JVD; No carotid bruits CARDIAC: RRR, Normal S1, S2; no murmurs, rubs, gallops RESPIRATORY:  Clear to auscultation without rales, wheezing or rhonchi ; nonlabored, good air  movement. ABDOMEN: Soft, non-tender, non-distended EXTREMITIES:  No edema; No deformity     ASSESSMENT AND PLAN: .    Problem List Items Addressed This Visit       Cardiology Problems   Hyperlipidemia with target low density lipoprotein (LDL) cholesterol less than 55 mg/dL (Chronic)   Current Medications: Ezetimibe 10 mg (started on 06/09/2022); Repatha injections. Intolerances: Zocor, Lipitor, rosuvastatin - severe muscle pain and cramps  Risk Factors: recent MI S/P PCI nov 2023, CAD,hypertension, elevated Lp(a) 191 (04/26/2022) LDL goal: < 55 mg/dl  -Restart Repatha Nexletol previously discontinued due to suspected reaction. Rechallenge planned post metoprolol stabilization. - Rechallenge with Nexletol after two weeks on increased metoprolol dose.  Relevant Medications   metoprolol tartrate (LOPRESSOR) 50 MG tablet   Labile essential hypertension (Chronic)   Blood pressure slightly elevated. Managed with increased metoprolol from 25-50 mg a.m. and p.m to 50 mg twice daily.  Losartan at 25 mg and previously is, but now stopped.  Will restart if metoprolol not tolerated. - Monitor blood pressure at home. - Consider adding losartan if metoprolol dose reduction is needed.      Relevant Medications   metoprolol tartrate (LOPRESSOR) 50 MG tablet   LM-CAD:  LIMA-LAD, patent SVG-D2 & SVG-OM2 (DES PCI) , BMS PCI rPDA (Chronic)   No real anginal symptoms.  Most recent heart cath in November 2023 had Denovo lesion in the SVG to OM and patent stent in the RCA.  There is a stent placed in the vein graft to the OM.  No further anginal symptoms. In the absence of active anginal symptoms we will hold off on screening surveillance stress test as the next 1 will be to in roughly 3 years at which time he will be over 26 years old. -Continue maintenance Plavix 75 mg monotherapy  Okay to hold Plavix 5 to 7 days preop for surgeries or procedures. -Continue Lopressor now at 50 mg twice  daily -Continue lipid management as noted with Repatha, Zetia and try to rechallenge with Nexletol.      Relevant Medications   metoprolol tartrate (LOPRESSOR) 50 MG tablet   Orthostatic hypotension (Chronic)   Both losartan and chlorthalidone discontinued because of concerns of orthostatic hypotension.  Would allow for permissive hypertension targeting systolic blood pressures in the 130-150 range systolic.      Relevant Medications   metoprolol tartrate (LOPRESSOR) 50 MG tablet     Other   DOE (dyspnea on exertion) (Chronic)   I still think there may be some diastolic dysfunction mediated exertional dyspnea, but we are limited by orthostatic hypotension symptoms and inability to titrate his afterload reduction. We backed off of the beta-blocker open to allow for more heart rate responses and he had more palpitations.  We are therefore now back to 50 mg twice daily Lopressor.  Continue encourage activity and exercise.      Heart palpitations - Primary (Chronic)   Episodes resolved with increased metoprolol to 50 mg twice daily. No further episodes. Holter monitor if recurrence. -Refill Rx for metoprolol tartrate with increased dose to 50 mg BID. - Monitor for recurrence. - Consider Holter monitor if palpitations recur.      Myalgia due to statin (Chronic)   Not tolerant of multiple different statins.  Lipids had significant improvement with Repatha, but still not at goal. Fortunately, he stopped doing the Repatha. -> Restart Repatha      Pedal edema (Chronic)   Tightness in feet, no significant swelling. Attributed to decreased venous return. - Recommend wearing support socks. - Encourage pedal exercises.       Chronic Sinusitis On clindamycin. No changes needed.  Recording duration: 25 minutes      Follow-up in six months. Preference for cardiologist. - Schedule follow-up appointment in six months. Return in about 6 months (around 03/02/2024) for Routine follow up  with me, [employer].     Signed, Marykay Lex, MD, MS Bryan Lemma, M.D., M.S. Interventional Cardiologist  [facility]  Pager # [phone] Phone # [phone] [address] [place], [place] [address]

## 2023-09-03 ENCOUNTER — Encounter: Payer: Self-pay | Admitting: Cardiology

## 2023-09-03 DIAGNOSIS — R600 Localized edema: Secondary | ICD-10-CM | POA: Insufficient documentation

## 2023-09-03 MED ORDER — REPATHA SURECLICK 140 MG/ML ~~LOC~~ SOAJ: 140.0000 mg | SUBCUTANEOUS | 3 refills | Status: DC

## 2023-09-03 NOTE — Assessment & Plan Note (Signed)
 Episodes resolved with increased metoprolol to 50 mg twice daily. No further episodes. Holter monitor if recurrence. -Refill Rx for metoprolol tartrate with increased dose to 50 mg BID. - Monitor for recurrence. - Consider Holter monitor if palpitations recur.

## 2023-09-03 NOTE — Assessment & Plan Note (Signed)
 No real anginal symptoms.  Most recent heart cath in November 2023 had Denovo lesion in the SVG to OM and patent stent in the RCA.  There is a stent placed in the vein graft to the OM.  No further anginal symptoms. In the absence of active anginal symptoms we will hold off on screening surveillance stress test as the next 1 will be to in roughly 3 years at which time he will be over 89 years old. -Continue maintenance Plavix 75 mg monotherapy  Okay to hold Plavix 5 to 7 days preop for surgeries or procedures. -Continue Lopressor now at 50 mg twice daily -Continue lipid management as noted with Repatha, Zetia and try to rechallenge with Nexletol.

## 2023-09-03 NOTE — Assessment & Plan Note (Signed)
 Blood pressure slightly elevated. Managed with increased metoprolol from 25-50 mg a.m. and p.m to 50 mg twice daily.  Losartan at 25 mg and previously is, but now stopped.  Will restart if metoprolol not tolerated. - Monitor blood pressure at home. - Consider adding losartan if metoprolol dose reduction is needed.

## 2023-09-03 NOTE — Assessment & Plan Note (Signed)
 Current Medications: Ezetimibe 10 mg (started on 06/09/2022); Repatha injections. Intolerances: Zocor, Lipitor, rosuvastatin - severe muscle pain and cramps  Risk Factors: recent MI S/P PCI nov 2023, CAD,hypertension, elevated Lp(a) 191 (04/26/2022) LDL goal: < 55 mg/dl  -Restart Repatha Nexletol previously discontinued due to suspected reaction. Rechallenge planned post metoprolol stabilization. - Rechallenge with Nexletol after two weeks on increased metoprolol dose.

## 2023-09-03 NOTE — Assessment & Plan Note (Signed)
 Both losartan and chlorthalidone discontinued because of concerns of orthostatic hypotension.  Would allow for permissive hypertension targeting systolic blood pressures in the 130-150 range systolic.

## 2023-09-03 NOTE — Assessment & Plan Note (Addendum)
 Not tolerant of multiple different statins.  Lipids had significant improvement with Repatha, but still not at goal. Fortunately, he stopped doing the Repatha. -> Restart Repatha

## 2023-09-03 NOTE — Assessment & Plan Note (Signed)
 Tightness in feet, no significant swelling. Attributed to decreased venous return. - Recommend wearing support socks. - Encourage pedal exercises.

## 2023-09-03 NOTE — Assessment & Plan Note (Signed)
 I still think there may be some diastolic dysfunction mediated exertional dyspnea, but we are limited by orthostatic hypotension symptoms and inability to titrate his afterload reduction. We backed off of the beta-blocker open to allow for more heart rate responses and he had more palpitations.  We are therefore now back to 50 mg twice daily Lopressor.  Continue encourage activity and exercise.

## 2023-09-15 ENCOUNTER — Other Ambulatory Visit: Payer: Self-pay | Admitting: Registered Nurse

## 2023-09-15 DIAGNOSIS — R1011 Right upper quadrant pain: Secondary | ICD-10-CM

## 2023-09-22 ENCOUNTER — Encounter: Payer: Self-pay | Admitting: Physical Medicine and Rehabilitation

## 2023-09-22 ENCOUNTER — Ambulatory Visit: Admitting: Physical Medicine and Rehabilitation

## 2023-09-22 DIAGNOSIS — M47816 Spondylosis without myelopathy or radiculopathy, lumbar region: Secondary | ICD-10-CM

## 2023-09-22 DIAGNOSIS — M7918 Myalgia, other site: Secondary | ICD-10-CM

## 2023-09-22 DIAGNOSIS — G8929 Other chronic pain: Secondary | ICD-10-CM | POA: Diagnosis not present

## 2023-09-22 DIAGNOSIS — M961 Postlaminectomy syndrome, not elsewhere classified: Secondary | ICD-10-CM | POA: Diagnosis not present

## 2023-09-22 DIAGNOSIS — R269 Unspecified abnormalities of gait and mobility: Secondary | ICD-10-CM

## 2023-09-22 DIAGNOSIS — M545 Low back pain, unspecified: Secondary | ICD-10-CM | POA: Diagnosis not present

## 2023-09-22 NOTE — Progress Notes (Signed)
 Pain Scale   Average Pain 8 Patient advising that he had an RFA done 8 months ago on the left side and pain is gone, but advising that he has severe pain on his right side when he walks or stands for periods of time. Patient RFA  bilateral done 2024 in summer         +Driver, -BT, -Dye Allergies.

## 2023-09-22 NOTE — Progress Notes (Signed)
 Daniel [name] - 89 y.o. male MRN [id number]  Date of birth: 06-28-1932  Office Visit Note: Visit Date: 09/22/2023 PCP: Suan Elm, MD Referred by: Suan Elm, MD  Subjective: Chief Complaint  Patient presents with   Lower Back - Pain   HPI: Daniel [name] is a 89 y.o. male who comes in today for evaluation of chronic, worsening and severe right sided lower back/buttock pain. Patient is well known for us . Pain ongoing for several years, worsens with standing and walking. Sitting seems to alleviate his pain. Some relief of pain with home exercise regimen, rest and use of medications. He continues chronic pain management with his primary care provider Dr. Suan Elm, he does take Tramadol  as needed. Lumbar MRI from January of 2023 exhibits previous laminectomy decompression at L4-L5, there is also grade 1 anterolisthesis, facet arthropathy, mild central canal stenosis and lateral recess/foraminal narrowing noted at this level. There is facet arthropathy and mild spinal canal stenosis noted at L5-S1. Patient does have a history of central decompressive laminectomy at the level of L4-L5 in 2010 by Dr. Amalia Badder. We have performed several bilateral L4-L5 and L5-S1 radiofrequency ablation procedures in our office over the years, he has done well with these procedures in the past. He did experience left leg paresthesias following radiofrequency ablation in 2023, however these symptoms did eventually subside. He did undergo NCV with EMG by Dr. Daisey Dryer in June 2023 that exhibits peripheral neuropathy of the left lower extremity. More recently I performed myofascial trigger point injection to right gluteal region in January of 2025, he reports some relief of pain with this procedure for about 2 days. Patient denies focal weakness, numbness and tingling. No recent trauma or falls. He is using cane to assist with ambulation.      Review of Systems  Musculoskeletal:  Positive for back pain.   Neurological:  Negative for tingling, sensory change, focal weakness and weakness.  All other systems reviewed and are negative.  Otherwise per HPI.  Assessment & Plan: Visit Diagnoses:    ICD-10-CM   1. Chronic bilateral low back pain without sciatica  M54.50 Ambulatory referral to Physical Medicine Rehab   G89.29     2. Chronic buttock pain  M79.18 Ambulatory referral to Physical Medicine Rehab   G89.29     3. Facet hypertrophy of lumbar region  M47.816 Ambulatory referral to Physical Medicine Rehab    4. Myofascial pain syndrome  M79.18 Ambulatory referral to Physical Medicine Rehab    5. Post laminectomy syndrome  M96.1 Ambulatory referral to Physical Medicine Rehab    6. Gait abnormality  R26.9 Ambulatory referral to Physical Medicine Rehab       Plan: Findings:  Chronic, worsening and severe right sided lower back/buttock pain. No radicular symptoms down the legs. Patient continues to have severe pain despite good conservaitve therapies such as home exericse, rest and use of medications. Recent right gluteal myofascial trigger point injection provided short term relief of pain. Patients clinical presentation and exam are consistent with facet mediated pain. There is fairly large arthritic joint on the right at L4-L5. We discussed treatment plan in detail today. Next step is to perform right L4-L5 radiofrequency ablation under fluoroscopic guidance. I am hopeful this procedure will provide lasting relief of pain. He has no questions regarding ablation procedure at this time. Should his pain persist post ablation would consider re-grouping with physical therapy for treatments. No red flag symptoms noted upon exam today.  Meds & Orders: No orders of the defined types were placed in this encounter.   Orders Placed This Encounter  Procedures   Ambulatory referral to Physical Medicine Rehab    Follow-up: Return for Right L4-L5 and L5-S1 radiofrequency ablation.    Procedures: No procedures performed      Clinical History: 05/19/2017 NCV & EMG Findings: Extensive electrodiagnostic testing of the left upper and lower extremity shows:  1. Right median, radial, and ulnar sensory responses are within normal limits. 2. Right median and ulnar motor responses are within normal limits. 3. Right sural and superficial peroneal sensory responses are absent. 4. Right tibial motor response shows reduced amplitude and slowed conduction velocity. The peroneal motor response is absent at the extensor digitorum brevis, and normal at the tibialis anterior. 5. Right tibial H reflex study shows prolonged latency. 6. There is no evidence of active or chronic motor axon loss changes affecting any of the tested muscles in the left upper extremity. 7. Chronic motor axon loss changes are seen affecting the left gastrocnemius and biceps femoris short head muscles, without accompanied active denervation.   Impression: 1. 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  A superimposed S1 radiculopathy affecting the left lower extremity is also likely. 2. There is no evidence of a sensorimotor neuropathy or cervical radiculopathy affecting the left upper extremity.     ___________________________ Reyna Cava, DO -------------------------------- MRI lumbar spine:   TECHNIQUE: Sagittal and axial T1 and T2-weighted sequences were performed. Additional sagittal STIR images were performed.   INDICATION: Back pain   COMPARISON: None   FINDINGS:  #  5 mm grade 1 anterolisthesis L4-5 with minimal grade 1 anterolisthesis is L5-S1.  #  Vertebral body heights are well maintained.  #  Trace type stress I Modic endplate change L3-4 and type II at L4-5  #  Conus terminates at T12-L1 without evidence of tethering.  #  Nerve roots appear normal.  #  Incidental findings: None.    #   L1-2: Mild degenerative disc disease. Canal is well-maintained. Neuroforamina patent.  #    #  L2-3: Mild degenerative disc disease. Central canal is well-maintained. Neural foramina are patent.  #    #  L3-4: Mild degenerative disc disease. There is facet and disc bulge causing mild central canal stenosis with mild bilateral foraminal narrowing.  #    #  L4-5: Severe degenerative disc disease. Grade 1 anterolisthesis and laminectomy decompression with facet arthropathy and disc uncovering causing mild central canal and lateral recess stenosis and moderate bilateral foraminal narrowing  #    #  L5-S1: Grade 1 anterolisthesis. Facet arthropathy and disc bulge causes mild central canal stenosis and mild bilateral foraminal narrowing.    IMPRESSION:   1.  L4-5 severe degenerative disc disease with grade 1 anterolisthesis and laminectomy decompression. There is mild residual central canal lateral recess stenosis.   2.  L5-S1 minimal grade 1 anterolisthesis contributes to mild central canal stenosis   Electronically Signed by: Bobby Burr on 06/10/2021 1:58 PM   He reports that he has never smoked. He has never used smokeless tobacco. No results for input(s): "HGBA1C", "LABURIC" in the last 8760 hours.  Objective:  VS:  HT:    WT:   BMI:     BP:   HR: bpm  TEMP: ( )  RESP:  Physical Exam Vitals and nursing note reviewed.  HENT:     Head: Normocephalic  and atraumatic.     Right Ear: External ear normal.     Left Ear: External ear normal.     Nose: Nose normal.     Mouth/Throat:     Mouth: Mucous membranes are moist.  Eyes:     Extraocular Movements: Extraocular movements intact.  Cardiovascular:     Rate and Rhythm: Normal rate.     Pulses: Normal pulses.  Pulmonary:     Effort: Pulmonary effort is normal.  Abdominal:     General: Abdomen is flat. There is distension.  Musculoskeletal:        General: Tenderness present.     Cervical back: Normal range of motion.      Comments: Patient is slow to rise from seated position to standing. Mild pain noted with facet loading and lumbar extension. 5/5 strength noted with bilateral hip flexion, knee flexion/extension, ankle dorsiflexion/plantarflexion and EHL. No clonus noted bilaterally. No pain upon palpation of greater trochanters. No pain with internal/external rotation of bilateral hips. Sensation intact bilaterally. Negative slump test bilaterally. Ambulates with cane, gait slow.  Skin:    General: Skin is warm and dry.     Capillary Refill: Capillary refill takes less than 2 seconds.  Neurological:     General: No focal deficit present.     Mental Status: He is alert and oriented to person, place, and time.  Psychiatric:        Mood and Affect: Mood normal.        Behavior: Behavior normal.     Ortho Exam  Imaging: No results found.  Past Medical/Family/Surgical/Social History: Medications & Allergies reviewed per EMR, new medications updated. Patient Active Problem List   Diagnosis Date Noted   Pedal edema 09/03/2023   Abnormal weight loss 03/12/2023   Nodule of left anterior chest wall 03/12/2023   Myalgia due to statin 05/22/2021   Branch retinal vein occlusion of left eye with macular edema 09/16/2020   Chronic pain of right knee 03/19/2020   Hypertensive retinopathy of both eyes 11/13/2017   Stenosis of cervical spine with myelopathy (HCC) 07/14/2017   Pseudophakia of both eyes 05/24/2017   Stable branch retinal vein occlusion of right eye 05/24/2017   DOE (dyspnea on exertion) 03/10/2017   Acute maxillary sinusitis 02/15/2017   Allergic rhinitis 02/15/2017   Preoperative cardiovascular examination 08/16/2016   Chronic pain of both knees 07/13/2016   Fatigue due to treatment 09/11/2015   Unstable angina (HCC) 08/06/2015   Low TSH level 08/13/2014   Orthostatic hypotension 12/17/2013   Pseudoptosis 05/22/2013   Dermatochalasis 02/21/2013   Epiphora 02/21/2013   S/P CABG x 3    Labile  essential hypertension    Hyperlipidemia with target low density lipoprotein (LDL) cholesterol less than 55 mg/dL    Heart palpitations    Nuclear cataract 01/26/2012   PCO (posterior capsular opacification) 01/26/2012   Pseudophakia 01/26/2012   LM-CAD:  LIMA-LAD, patent SVG-D2 & SVG-OM2 (DES PCI) , BMS PCI rPDA 07/27/1995   Past Medical History:  Diagnosis Date   Arthritis    CAD of autologous arterial graft 2010   a) ~2010 & 2017 - LIMA-LAD was atretic (LAD filling via SVG-D2); b)  04/2022: LIMA-LAD now fully patent, 85% mSVG-OM2 =? DES PCI   CAD S/P percutaneous coronary angioplasty 11/2006   a. 6/'08: PCI nongrafted distal RCA-PDA: Mini vision BMS 2.25 mm x 28 mm; b. Relook Cath 08/2015: stable,: atretic LIMA;; 04/2022: 85% SVG-OM2  => DES PCI Synergy XD 4.0 mmx16 mm);  LM 60%, pLAD 70%, p-m LAD 100% & p-m LCX 100%; 40% dRCA with patent rPDA BMS; SVG-Diag & LIMA-LAD patent   Complication of anesthesia    Coronary artery disease involving left main coronary artery [id number]   Last cath 2010: 70-80 % ostial left main, 80-90% LAD, subtotal CTO LCx-OM2, RCA patent w/ patent BMS stent (dRCA-PDA); SVG-diagonal backfills LAD, atretic LIMA-LAD -- medical therapy; stable by relook cath in March 2017   Difficult intubation    VERY NARROW AIRWAY PER ANESTHESIOLOGIST (1997 CABG AT [facility])   Dyslipidemia, goal LDL below 70    Dysrhythmia    Heart palpitations    Never fully diagnosed.   History of kidney stones    Hypertension    Insomnia    Mass in chest    JUST WATCHING , WILL CHECK OUT AFTER SURGERY   Pneumonia    S/P CABG x 3 1997   LIMA-LAD, SVG-D2, SVG-OM --> LIMA known to be atretic, but SVG-D2 fills diagonal and LAD   Stenosis of cervical spine with myelopathy (HCC)    Tingling    in fingers   Family History  Problem Relation Age of Onset   Stroke Mother 14   Heart disease Mother    Arthritis Mother    Heart attack Father 37   Other Sister    Heart disease Brother    Allergic  rhinitis Neg Hx    Atopy Neg Hx    Angioedema Neg Hx    Asthma Neg Hx    Eczema Neg Hx    Immunodeficiency Neg Hx    Urticaria Neg Hx    Past Surgical History:  Procedure Laterality Date   48-HOUR MONITOR  03/2017   Relatively normal.  Very infrequent ectopy.  Short PAT run with occasional PACs/PVCs and bigeminy.  No symptoms noted on monitor.  Longest PAT runs were less than 10 beats.   BACK SURGERY     decompression  lower back    CARDIAC CATHETERIZATION N/A 08/08/2015   Procedure: Left Heart Cath and Cors/Grafts Angiography;  Surgeon: Arleen Lacer, MD;  Location: [facility] INVASIVE CV LAB;; -->  LM 80%-70%. Ost LAD 80% then prox-mid 100% (after small D1). Small LCx & OM2 patent with 100% OM2. Prox & distal RCA 20%, patent BMS in distal RCA-rPDA.  Patent/atretic LIMA-dLAD, SVG-D2 (backdills entire dLAD) & SVG-OM2.    COLONOSCOPY     CORONARY ANGIOPLASTY WITH STENT PLACEMENT  11/2006   BMS PCI of distal RCA-RPDA: PCI with a 2.25 mm x 28 mm Mini Vision bare-metal stent.   CORONARY ARTERY BYPASS GRAFT  [id number]   LIMA - LAD, SVG- diagonal, SVG-OM   CORONARY STENT INTERVENTION N/A 04/26/2022   Procedure: CORONARY STENT INTERVENTION;  Surgeon: Swazil[place], Peter M, MD;  Location: [facility] INVASIVE CV LAB;  Service: CV: 95% mid SVG-OM2 => DES PCI (Synergy XD DES 4.0 mm x 16 mm) -> complicated by transient no reflow resolved with IC verapamil    EYE SURGERY     BIL CATARACT   KIDNEY STONE SURGERY     KNEE ARTHROSCOPY     RT  KNEE   LEFT HEART CATH AND CORS/GRAFTS ANGIOGRAPHY  11/2006   High-grade distal RCA and PDA stenosis --> BMS PCI.    LEFT HEART CATH AND CORS/GRAFTS ANGIOGRAPHY  2010   Native coronaries at 70% to 80% ostial left main. LAD had diffuse 80% to 90% stenosis   LEFT HEART CATH AND CORS/GRAFTS ANGIOGRAPHY N/A 04/26/2022   Procedure: LEFT HEART CATH  AND CORS/GRAFTS ANGIOGRAPHY;  Surgeon: [place], Peter M, MD;  Location: [facility] INVASIVE CV LAB;  Service: CV::   2 V Occlusive Native CAD: 60% LM,  70% ostial-prox LAD, 100% mid LAD, 1 100% prox-mid LCx; 40% distRCA with patent PDA stent.  Patent LIMA-LAD, SVG-D2. 95% mid SVG-OM2 => DES PCI   NM MYOVIEW  LTD  03/17/2017   No significant reversible ischemia. Increased TID ratio 1.34. LVEF 54% with normal wall motion. This is a low risk study.   POSTERIOR CERVICAL FUSION/FORAMINOTOMY N/A 07/14/2017   Procedure: LAMINECTOMY CERVICAL THREE- CERVICAL FOUR WITH LATERAL MASS FUSION;  Surgeon: Augusto Blonder, MD;  Location: [facility] OR;  Service: Neurosurgery;  Laterality: N/A;  LAMINECTOMY CERVICAL 3- CERVICAL 4 WITH LATERAL MASS FUSION   TOTAL KNEE ARTHROPLASTY Right 04/07/2020   Procedure: TOTAL KNEE ARTHROPLASTY;  Surgeon: Christie Cox, MD;  Location: [facility] ORS;  Service: Orthopedics;  Laterality: Right;   TRANSTHORACIC ECHOCARDIOGRAM  04/26/2022   EF 60 to 65%, normal LV function, no RWMA, mild concentric LVH, G1 DD, normal RV systolic function, no significant valvular abnormalities.   Social History   Occupational History   Occupation: retired Psychologist, educational  Tobacco Use   Smoking status: Never   Smokeless tobacco: Never  Vaping Use   Vaping status: Never Used  Substance and Sexual Activity   Alcohol use: No   Drug use: No   Sexual activity: Not on file

## 2023-09-23 ENCOUNTER — Ambulatory Visit
Admission: RE | Admit: 2023-09-23 | Discharge: 2023-09-23 | Disposition: A | Discharge status: Home / Self Care | Source: Ambulatory Visit | Attending: Registered Nurse | Admitting: Registered Nurse

## 2023-09-23 DIAGNOSIS — R1011 Right upper quadrant pain: Secondary | ICD-10-CM

## 2023-10-10 ENCOUNTER — Encounter: Admitting: Physical Medicine and Rehabilitation

## 2023-10-19 ENCOUNTER — Ambulatory Visit: Admitting: Physical Medicine and Rehabilitation

## 2023-10-19 ENCOUNTER — Other Ambulatory Visit: Payer: Self-pay

## 2023-10-19 VITALS — BP 155/70 | HR 64

## 2023-10-19 DIAGNOSIS — M47816 Spondylosis without myelopathy or radiculopathy, lumbar region: Secondary | ICD-10-CM

## 2023-10-19 MED ORDER — METHYLPREDNISOLONE ACETATE 40 MG/ML IJ SUSP: 40.0000 mg | Freq: Once | INTRAMUSCULAR | Status: DC

## 2023-10-19 NOTE — Progress Notes (Signed)
 Pain Scale   Average Pain 5 Patient advises his lower back pain is constant, patient states it does radiate fro side to side        +Driver, -BT, -Dye Allergies.

## 2023-10-19 NOTE — Patient Instructions (Signed)

## 2023-10-26 NOTE — Procedures (Signed)
 Lumbar Facet Joint Nerve Denervation  Patient: Daniel [name]      Date of Birth: 1933-02-24 MRN: [id number] PCP: Suan Elm, MD      Visit Date: 10/19/2023   Universal Protocol:    Date/Time: 05/21/255:59 AM  Consent Given By: the patient  Position: PRONE  Additional Comments: Vital signs were monitored before and after the procedure. Patient was prepped and draped in the usual sterile fashion. The correct patient, procedure, and site was verified.   Injection Procedure Details:   Procedure diagnoses:  1. Spondylosis without myelopathy or radiculopathy, lumbar region      Meds Administered:  Meds ordered this encounter  Medications   DISCONTD: methylPREDNISolone  acetate (DEPO-MEDROL ) injection 40 mg     Laterality: Right  Location/Site:  L4-L5, L3 and L4 medial branches and L5-S1, L4 medial branch and L5 dorsal ramus  Needle: 18 ga.,  10mm active tip, RF Cannula  Needle Placement: Along juncture of superior articular process and transverse pocess  Findings:  -Comments:  Procedure Details: For each desired target nerve, the corresponding transverse process (sacral ala for the L5 dorsal rami) was identified and the fluoroscope was positioned to square off the endplates of the corresponding vertebral body to achieve a true AP midline view.  The beam was then obliqued 15 to 20 degrees and caudally tilted 15 to 20 degrees to line up a trajectory along the target nerves. The skin over the target of the junction of superior articulating process and transverse process (sacral ala for the L5 dorsal rami) was infiltrated with 1ml of 1% Lidocaine  without Epinephrine .  The 18 gauge 10mm active tip outer cannula was advanced in trajectory view to the target.  This procedure was repeated for each target nerve.  Then, for all levels, the outer cannula placement was fine-tuned and the position was then confirmed with bi-planar imaging.    Test stimulation was done both at  sensory and motor levels to ensure there was no radicular stimulation. The target tissues were then infiltrated with 1 ml of 1% Lidocaine  without Epinephrine . Subsequently, a percutaneous neurotomy was carried out for 90 seconds at 80 degrees Celsius.  After the completion of the lesion, 1 ml of injectate was delivered. It was then repeated for each facet joint nerve mentioned above. Appropriate radiographs were obtained to verify the probe placement during the neurotomy.   Additional Comments:  The patient tolerated the procedure well Dressing: 2 x 2 sterile gauze and Band-Aid    Post-procedure details: Patient was observed during the procedure. Post-procedure instructions were reviewed.  Patient left the clinic in stable condition.

## 2023-10-26 NOTE — Progress Notes (Signed)
 Daniel [name] - 89 y.o. male MRN [id number]  Date of birth: 04-23-1933  Office Visit Note: Visit Date: 10/19/2023 PCP: Suan Elm, MD Referred by: Suan Elm, MD  Subjective: Chief Complaint  Patient presents with   Lower Back - Pain   HPI:  Daniel [name] is a 89 y.o. male who comes in todayfor planned repeat radiofrequency ablation of the Right L4-5 and L5-S1  Lumbar facet joints. This would be ablation of the corresponding medial branches and/or dorsal rami.  Patient has had double diagnostic blocks with more than 70% relief.  Subsequent ablation gave them more than 6 months of over 60% relief.  They have had chronic back pain for quite some time, more than 3 months, which has been an ongoing situation with recalcitrant axial back pain.  They have no radicular pain.  Their axial pain is worse with standing and ambulating and on exam today with facet loading.  They have had physical therapy as well as home exercise program.  The imaging noted in the chart below indicated facet pathology. Accordingly they meet all the criteria and qualification for for radiofrequency ablation and we are going to complete this today hopefully for more longer term relief as part of comprehensive management program.   ROS Otherwise per HPI.  Assessment & Plan: Visit Diagnoses:    ICD-10-CM   1. Spondylosis without myelopathy or radiculopathy, lumbar region  M47.816 XR C-ARM NO REPORT    Radiofrequency,Lumbar    DISCONTINUED: methylPREDNISolone  acetate (DEPO-MEDROL ) injection 40 mg      Plan: No additional findings.   Meds & Orders:  Meds ordered this encounter  Medications   DISCONTD: methylPREDNISolone  acetate (DEPO-MEDROL ) injection 40 mg    Orders Placed This Encounter  Procedures   Radiofrequency,Lumbar   XR C-ARM NO REPORT    Follow-up: Return if symptoms worsen or fail to improve.   Procedures: No procedures performed  Lumbar Facet Joint Nerve Denervation  Patient: Daniel [name]      Date of Birth: [birth year] MRN: [id number] PCP: Suan Elm, MD      Visit Date: 10/19/2023   Universal Protocol:    Date/Time: 05/21/255:59 AM  Consent Given By: the patient  Position: PRONE  Additional Comments: Vital signs were monitored before and after the procedure. Patient was prepped and draped in the usual sterile fashion. The correct patient, procedure, and site was verified.   Injection Procedure Details:   Procedure diagnoses:  1. Spondylosis without myelopathy or radiculopathy, lumbar region      Meds Administered:  Meds ordered this encounter  Medications   DISCONTD: methylPREDNISolone  acetate (DEPO-MEDROL ) injection 40 mg     Laterality: Right  Location/Site:  L4-L5, L3 and L4 medial branches and L5-S1, L4 medial branch and L5 dorsal ramus  Needle: 18 ga.,  10mm active tip, RF Cannula  Needle Placement: Along juncture of superior articular process and transverse pocess  Findings:  -Comments:  Procedure Details: For each desired target nerve, the corresponding transverse process (sacral ala for the L5 dorsal rami) was identified and the fluoroscope was positioned to square off the endplates of the corresponding vertebral body to achieve a true AP midline view.  The beam was then obliqued 15 to 20 degrees and caudally tilted 15 to 20 degrees to line up a trajectory along the target nerves. The skin over the target of the junction of superior articulating process and transverse process (sacral ala for the L5 dorsal rami) was infiltrated with 1ml of  1% Lidocaine  without Epinephrine .  The 18 gauge 10mm active tip outer cannula was advanced in trajectory view to the target.  This procedure was repeated for each target nerve.  Then, for all levels, the outer cannula placement was fine-tuned and the position was then confirmed with bi-planar imaging.    Test stimulation was done both at sensory and motor levels to ensure there was no radicular  stimulation. The target tissues were then infiltrated with 1 ml of 1% Lidocaine  without Epinephrine . Subsequently, a percutaneous neurotomy was carried out for 90 seconds at 80 degrees Celsius.  After the completion of the lesion, 1 ml of injectate was delivered. It was then repeated for each facet joint nerve mentioned above. Appropriate radiographs were obtained to verify the probe placement during the neurotomy.   Additional Comments:  The patient tolerated the procedure well Dressing: 2 x 2 sterile gauze and Band-Aid    Post-procedure details: Patient was observed during the procedure. Post-procedure instructions were reviewed.  Patient left the clinic in stable condition.      Clinical History: 05/19/2017 NCV & EMG Findings: Extensive electrodiagnostic testing of the left upper and lower extremity shows:  1. Right median, radial, and ulnar sensory responses are within normal limits. 2. Right median and ulnar motor responses are within normal limits. 3. Right sural and superficial peroneal sensory responses are absent. 4. Right tibial motor response shows reduced amplitude and slowed conduction velocity. The peroneal motor response is absent at the extensor digitorum brevis, and normal at the tibialis anterior. 5. Right tibial H reflex study shows prolonged latency. 6. There is no evidence of active or chronic motor axon loss changes affecting any of the tested muscles in the left upper extremity. 7. Chronic motor axon loss changes are seen affecting the left gastrocnemius and biceps femoris short head muscles, without accompanied active denervation.   Impression: 1. 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  A superimposed S1 radiculopathy affecting the left lower extremity is also likely. 2. There is no evidence of a sensorimotor neuropathy or cervical radiculopathy affecting the  left upper extremity.     ___________________________ Reyna Cava, DO -------------------------------- MRI lumbar spine:   TECHNIQUE: Sagittal and axial T1 and T2-weighted sequences were performed. Additional sagittal STIR images were performed.   INDICATION: Back pain   COMPARISON: None   FINDINGS:  #  5 mm grade 1 anterolisthesis L4-5 with minimal grade 1 anterolisthesis is L5-S1.  #  Vertebral body heights are well maintained.  #  Trace type stress I Modic endplate change L3-4 and type II at L4-5  #  Conus terminates at T12-L1 without evidence of tethering.  #  Nerve roots appear normal.  #  Incidental findings: None.    #  L1-2: Mild degenerative disc disease. Canal is well-maintained. Neuroforamina patent.  #    #  L2-3: Mild degenerative disc disease. Central canal is well-maintained. Neural foramina are patent.  #    #  L3-4: Mild degenerative disc disease. There is facet and disc bulge causing mild central canal stenosis with mild bilateral foraminal narrowing.  #    #  L4-5: Severe degenerative disc disease. Grade 1 anterolisthesis and laminectomy decompression with facet arthropathy and disc uncovering causing mild central canal and lateral recess stenosis and moderate bilateral foraminal narrowing  #    #  L5-S1: Grade 1 anterolisthesis. Facet arthropathy and disc bulge causes mild central canal stenosis and mild  bilateral foraminal narrowing.    IMPRESSION:   1.  L4-5 severe degenerative disc disease with grade 1 anterolisthesis and laminectomy decompression. There is mild residual central canal lateral recess stenosis.   2.  L5-S1 minimal grade 1 anterolisthesis contributes to mild central canal stenosis   Electronically Signed by: Bobby Burr on 06/10/2021 1:58 PM     Objective:  VS:  HT:    WT:   BMI:     BP:(!) 155/70  HR:64bpm  TEMP: ( )  RESP:  Physical Exam Vitals and nursing note reviewed.  Constitutional:      General: He is not in acute  distress.    Appearance: Normal appearance. He is not ill-appearing.  HENT:     Head: Normocephalic and atraumatic.     Right Ear: External ear normal.     Left Ear: External ear normal.     Nose: No congestion.  Eyes:     Extraocular Movements: Extraocular movements intact.  Cardiovascular:     Rate and Rhythm: Normal rate.     Pulses: Normal pulses.  Pulmonary:     Effort: Pulmonary effort is normal. No respiratory distress.  Abdominal:     General: There is no distension.     Palpations: Abdomen is soft.  Musculoskeletal:        General: No tenderness or signs of injury.     Cervical back: Neck supple.     Right lower leg: No edema.     Left lower leg: No edema.     Comments: Patient has good distal strength without clonus.  Skin:    Findings: No erythema or rash.  Neurological:     General: No focal deficit present.     Mental Status: He is alert and oriented to person, place, and time.     Sensory: No sensory deficit.     Motor: No weakness or abnormal muscle tone.     Coordination: Coordination normal.  Psychiatric:        Mood and Affect: Mood normal.        Behavior: Behavior normal.      Imaging: No results found.

## 2023-12-23 ENCOUNTER — Encounter: Payer: Self-pay | Admitting: Physical Medicine and Rehabilitation

## 2024-01-03 ENCOUNTER — Other Ambulatory Visit: Payer: Self-pay | Admitting: Gastroenterology

## 2024-01-03 DIAGNOSIS — R197 Diarrhea, unspecified: Secondary | ICD-10-CM

## 2024-01-05 ENCOUNTER — Ambulatory Visit: Admitting: Physical Medicine and Rehabilitation

## 2024-01-05 ENCOUNTER — Encounter: Payer: Self-pay | Admitting: Physical Medicine and Rehabilitation

## 2024-01-05 DIAGNOSIS — M5442 Lumbago with sciatica, left side: Secondary | ICD-10-CM

## 2024-01-05 DIAGNOSIS — G8929 Other chronic pain: Secondary | ICD-10-CM

## 2024-01-05 DIAGNOSIS — M961 Postlaminectomy syndrome, not elsewhere classified: Secondary | ICD-10-CM

## 2024-01-05 DIAGNOSIS — R269 Unspecified abnormalities of gait and mobility: Secondary | ICD-10-CM

## 2024-01-05 NOTE — Progress Notes (Signed)
 Daniel [name] Call - 89 y.o. male MRN [id number]  Date of birth: 08/04/1932  Office Visit Note: Visit Date: 01/05/2024 PCP: Daniel Ade, MD Referred by: Daniel Ade, MD  Subjective: Chief Complaint  Patient presents with   Lower Back - Pain   HPI: Daniel [name] is a 89 y.o. male who comes in today for evaluation of chronic, worsening and severe bilateral lower back pain. Intermittent pain to left great toe and  numbness/tingling to left lower extremity. Pain ongoing for several years, worsens with prolonged standing and walking. He describes pain as sore and aching sensation, currently rates as 5 out of 10. Sitting seems to alleviate his pain. Some relief of pain with home exercise regimen, rest and use of medications. History of formal physical therapy/dry needling with short term relief of pain. He continues chronic pain management with his primary care provider Dr. Ade Daniel, he does take Tramadol  as needed. Lumbar MRI from January of 2023 exhibits previous laminectomy decompression at L4-L5, there is also grade 1 anterolisthesis, facet arthropathy, mild central canal stenosis and lateral recess/foraminal narrowing noted at this level. There is facet arthropathy and mild spinal canal stenosis noted at L5-S1.  History of central decompressive laminectomy at the level of L4-L5 in 2010 by Dr. Lynwood Better. He has undergone multiple lumbar radiofrequency ablation procedures over the years with significant relief of pain. He did experience left leg paresthesias following radiofrequency ablation in 2023, however these symptoms did eventually subside. He did undergo NCV with EMG by Dr. Eldonna in June 2023 that exhibits peripheral neuropathy of the left lower extremity. History of myofascial trigger point injection to right gluteal region in January of 2025, some relief of pain for 2 days with this procedure. Of note, history of bilateral L4 transforaminal epidural steroid injection in our  office on 12/28/2021, he is unable to recall if this procedure was beneficial in alleviating his pain.   More recently, he underwent right L4-L5 and L5-S1 radiofrequency ablation in our office on 10/19/2023. No relief of pain with this procedure.   He continues to have excruciating lower back and left leg symptoms. The paresthesias to left lower extremity returned several months ago. He is using cane to assist with ambulation.      Review of Systems  Musculoskeletal:  Positive for back pain.  Neurological:  Positive for tingling. Negative for focal weakness and weakness.  All other systems reviewed and are negative.  Otherwise per HPI.  Assessment & Plan: Visit Diagnoses:    ICD-10-CM   1. Chronic bilateral low back pain with left-sided sciatica  G89.29 Ambulatory referral to Physical Medicine Rehab   M54.42     2. Post laminectomy syndrome  M96.1 Ambulatory referral to Physical Medicine Rehab    3. Gait abnormality  R26.9 Ambulatory referral to Physical Medicine Rehab       Plan: Findings:  Chronic, worsening and severe bilateral lower back pain. Intermittent pain to left great toe and  paresthesias to left lower extremity. Patient continues to have severe pain despite good conservative therapies such as formal physical therapy, dry needling, home exercise regimen, rest and use of medications. Patients clinical presentation and exam are complex, his symptoms today seem to be more radicular in nature. I do feel there was significant relief of pain with previous lumbar epidural steroid injection in 2023. There was a year gap at that time that we did not see him. Next step is to perform bilateral L4 transforaminal epidural steroid injection under  fluoroscopic guidance. If good relief of pain with injection we can repeat this procedure infrequently as needed. We also discuss other treatment options such as spinal cord stimulator placement. He is not interested in further surgical procedures.  Could also look at obtaining new lumbar MRI imaging. I instructed him to continue with current medication regimen. We will see him back for lumbar epidural steroid injection. No red flag symptoms noted upon exam today.     Meds & Orders: No orders of the defined types were placed in this encounter.   Orders Placed This Encounter  Procedures   Ambulatory referral to Physical Medicine Rehab    Follow-up: Return for Bilateral L4 transforaminal epidural steroid injection.   Procedures: No procedures performed      Clinical History: 05/19/2017 NCV & EMG Findings: Extensive electrodiagnostic testing of the left upper and lower extremity shows:  1. Right median, radial, and ulnar sensory responses are within normal limits. 2. Right median and ulnar motor responses are within normal limits. 3. Right sural and superficial peroneal sensory responses are absent. 4. Right tibial motor response shows reduced amplitude and slowed conduction velocity. The peroneal motor response is absent at the extensor digitorum brevis, and normal at the tibialis anterior. 5. Right tibial H reflex study shows prolonged latency. 6. There is no evidence of active or chronic motor axon loss changes affecting any of the tested muscles in the left upper extremity. 7. Chronic motor axon loss changes are seen affecting the left gastrocnemius and biceps femoris short head muscles, without accompanied active denervation.   Impression: 1. 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  A superimposed S1 radiculopathy affecting the left lower extremity is also likely. 2. There is no evidence of a sensorimotor neuropathy or cervical radiculopathy affecting the left upper extremity.     ___________________________ [name], DO -------------------------------- MRI lumbar spine:   TECHNIQUE: Sagittal and axial T1 and T2-weighted  sequences were performed. Additional sagittal STIR images were performed.   INDICATION: Back pain   COMPARISON: None   FINDINGS:  #  5 mm grade 1 anterolisthesis L4-5 with minimal grade 1 anterolisthesis is L5-S1.  #  Vertebral body heights are well maintained.  #  Trace type stress I Modic endplate change L3-4 and type II at L4-5  #  Conus terminates at T12-L1 without evidence of tethering.  #  Nerve roots appear normal.  #  Incidental findings: None.    #  L1-2: Mild degenerative disc disease. Canal is well-maintained. Neuroforamina patent.  #    #  L2-3: Mild degenerative disc disease. Central canal is well-maintained. Neural foramina are patent.  #    #  L3-4: Mild degenerative disc disease. There is facet and disc bulge causing mild central canal stenosis with mild bilateral foraminal narrowing.  #    #  L4-5: Severe degenerative disc disease. Grade 1 anterolisthesis and laminectomy decompression with facet arthropathy and disc uncovering causing mild central canal and lateral recess stenosis and moderate bilateral foraminal narrowing  #    #  L5-S1: Grade 1 anterolisthesis. Facet arthropathy and disc bulge causes mild central canal stenosis and mild bilateral foraminal narrowing.    IMPRESSION:   1.  L4-5 severe degenerative disc disease with grade 1 anterolisthesis and laminectomy decompression. There is mild residual central canal lateral recess stenosis.   2.  L5-S1 minimal grade 1 anterolisthesis contributes to mild central canal stenosis   Electronically Signed  by: Dallas Jubilee on 06/10/2021 1:58 PM   He reports that he has never smoked. He has never used smokeless tobacco. No results for input(s): HGBA1C, LABURIC in the last 8760 hours.  Objective:  VS:  HT:    WT:   BMI:     BP:   HR: bpm  TEMP: ( )  RESP:  Physical Exam Vitals and nursing note reviewed.  HENT:     Head: Normocephalic and atraumatic.     Right Ear: External ear normal.     Left Ear:  External ear normal.     Nose: Nose normal.     Mouth/Throat:     Mouth: Mucous membranes are moist.  Eyes:     Extraocular Movements: Extraocular movements intact.  Cardiovascular:     Rate and Rhythm: Normal rate.     Pulses: Normal pulses.  Pulmonary:     Effort: Pulmonary effort is normal.  Abdominal:     General: Abdomen is flat. There is no distension.  Musculoskeletal:        General: Tenderness present.     Cervical back: Normal range of motion.     Comments: Patient is slow to rise from seated position to standing. Good lumbar range of motion. No pain noted with facet loading. 5/5 strength noted with bilateral hip flexion, knee flexion/extension, ankle dorsiflexion/plantarflexion and EHL. No clonus noted bilaterally. No pain upon palpation of greater trochanters. No pain with internal/external rotation of bilateral hips. Sensation intact bilaterally. Negative slump test bilaterally. Ambulates with cane, gait slow and unsteady.   Skin:    General: Skin is warm and dry.     Capillary Refill: Capillary refill takes less than 2 seconds.  Neurological:     Mental Status: He is alert and oriented to person, place, and time.     Gait: Gait abnormal.  Psychiatric:        Mood and Affect: Mood normal.        Behavior: Behavior normal.     Ortho Exam  Imaging: No results found.  Past Medical/Family/Surgical/Social History: Medications & Allergies reviewed per EMR, new medications updated. Patient Active Problem List   Diagnosis Date Noted   Pedal edema 09/03/2023   Abnormal weight loss 03/12/2023   Nodule of left anterior chest wall 03/12/2023   Myalgia due to statin 05/22/2021   Branch retinal vein occlusion of left eye with macular edema 09/16/2020   Chronic pain of right knee 03/19/2020   Hypertensive retinopathy of both eyes 11/13/2017   Stenosis of cervical spine with myelopathy (HCC) 07/14/2017   Pseudophakia of both eyes 05/24/2017   Stable branch retinal vein  occlusion of right eye 05/24/2017   DOE (dyspnea on exertion) 03/10/2017   Acute maxillary sinusitis 02/15/2017   Allergic rhinitis 02/15/2017   Preoperative cardiovascular examination 08/16/2016   Chronic pain of both knees 07/13/2016   Fatigue due to treatment 09/11/2015   Unstable angina (HCC) 08/06/2015   Low TSH level 08/13/2014   Orthostatic hypotension 12/17/2013   Pseudoptosis 05/22/2013   Dermatochalasis 02/21/2013   Epiphora 02/21/2013   S/P CABG x 3    Labile essential hypertension    Hyperlipidemia with target low density lipoprotein (LDL) cholesterol less than 55 mg/dL    Heart palpitations    Nuclear cataract 01/26/2012   PCO (posterior capsular opacification) 01/26/2012   Pseudophakia 01/26/2012   LM-CAD:  LIMA-LAD, patent SVG-D2 & SVG-OM2 (DES PCI) , BMS PCI rPDA 07/27/1995   Past Medical History:  Diagnosis Date  Arthritis    CAD of autologous arterial graft 2010   a) ~2010 & 2017 - LIMA-LAD was atretic (LAD filling via SVG-D2); b)  04/2022: LIMA-LAD now fully patent, 85% mSVG-OM2 =? DES PCI   CAD S/P percutaneous coronary angioplasty 11/2006   a. 6/'08: PCI nongrafted distal RCA-PDA: Mini vision BMS 2.25 mm x 28 mm; b. Relook Cath 08/2015: stable,: atretic LIMA;; 04/2022: 85% SVG-OM2  => DES PCI Synergy XD 4.0 mmx16 mm); LM 60%, pLAD 70%, p-m LAD 100% & p-m LCX 100%; 40% dRCA with patent rPDA BMS; SVG-Diag & LIMA-LAD patent   Complication of anesthesia    Coronary artery disease involving left main coronary artery [id number]   Last cath 2010: 70-80 % ostial left main, 80-90% LAD, subtotal CTO LCx-OM2, RCA patent w/ patent BMS stent (dRCA-PDA); SVG-diagonal backfills LAD, atretic LIMA-LAD -- medical therapy; stable by relook cath in March 2017   Difficult intubation    VERY NARROW AIRWAY PER ANESTHESIOLOGIST (1997 CABG AT [facility])   Dyslipidemia, goal LDL below 70    Dysrhythmia    Heart palpitations    Never fully diagnosed.   History of kidney stones     Hypertension    Insomnia    Mass in chest    JUST WATCHING , WILL CHECK OUT AFTER SURGERY   Pneumonia    S/P CABG x 3 1997   LIMA-LAD, SVG-D2, SVG-OM --> LIMA known to be atretic, but SVG-D2 fills diagonal and LAD   Stenosis of cervical spine with myelopathy (HCC)    Tingling    in fingers   Family History  Problem Relation Age of Onset   Stroke Mother 21   Heart disease Mother    Arthritis Mother    Heart attack Father 74   Other Sister    Heart disease Brother    Allergic rhinitis Neg Hx    Atopy Neg Hx    Angioedema Neg Hx    Asthma Neg Hx    Eczema Neg Hx    Immunodeficiency Neg Hx    Urticaria Neg Hx    Past Surgical History:  Procedure Laterality Date   48-HOUR MONITOR  03/2017   Relatively normal.  Very infrequent ectopy.  Short PAT run with occasional PACs/PVCs and bigeminy.  No symptoms noted on monitor.  Longest PAT runs were less than 10 beats.   BACK SURGERY     decompression  lower back    CARDIAC CATHETERIZATION N/A 08/08/2015   Procedure: Left Heart Cath and Cors/Grafts Angiography;  Surgeon: Alm LELON Clay, MD;  Location: [facility] INVASIVE CV LAB;; -->  LM 80%-70%. Ost LAD 80% then prox-mid 100% (after small D1). Small LCx & OM2 patent with 100% OM2. Prox & distal RCA 20%, patent BMS in distal RCA-rPDA.  Patent/atretic LIMA-dLAD, SVG-D2 (backdills entire dLAD) & SVG-OM2.    COLONOSCOPY     CORONARY ANGIOPLASTY WITH STENT PLACEMENT  11/2006   BMS PCI of distal RCA-RPDA: PCI with a 2.25 mm x 28 mm Mini Vision bare-metal stent.   CORONARY ARTERY BYPASS GRAFT  [id number]   LIMA - LAD, SVG- diagonal, SVG-OM   CORONARY STENT INTERVENTION N/A 04/26/2022   Procedure: CORONARY STENT INTERVENTION;  Surgeon: Swazil[place], Peter M, MD;  Location: [facility] INVASIVE CV LAB;  Service: CV: 95% mid SVG-OM2 => DES PCI (Synergy XD DES 4.0 mm x 16 mm) -> complicated by transient no reflow resolved with IC verapamil    EYE SURGERY     BIL CATARACT   KIDNEY STONE SURGERY  KNEE ARTHROSCOPY      RT  KNEE   LEFT HEART CATH AND CORS/GRAFTS ANGIOGRAPHY  11/2006   High-grade distal RCA and PDA stenosis --> BMS PCI.    LEFT HEART CATH AND CORS/GRAFTS ANGIOGRAPHY  2010   Native coronaries at 70% to 80% ostial left main. LAD had diffuse 80% to 90% stenosis   LEFT HEART CATH AND CORS/GRAFTS ANGIOGRAPHY N/A 04/26/2022   Procedure: LEFT HEART CATH AND CORS/GRAFTS ANGIOGRAPHY;  Surgeon: [place]il[place], Peter M, MD;  Location: [facility] INVASIVE CV LAB;  Service: CV::   2 V Occlusive Native CAD: 60% LM, 70% ostial-prox LAD, 100% mid LAD, 1 100% prox-mid LCx; 40% distRCA with patent PDA stent.  Patent LIMA-LAD, SVG-D2. 95% mid SVG-OM2 => DES PCI   NM MYOVIEW  LTD  03/17/2017   No significant reversible ischemia. Increased TID ratio 1.34. LVEF 54% with normal wall motion. This is a low risk study.   POSTERIOR CERVICAL FUSION/FORAMINOTOMY N/A 07/14/2017   Procedure: LAMINECTOMY CERVICAL THREE- CERVICAL FOUR WITH LATERAL MASS FUSION;  Surgeon: Lanis Pupa, MD;  Location: [facility] OR;  Service: Neurosurgery;  Laterality: N/A;  LAMINECTOMY CERVICAL 3- CERVICAL 4 WITH LATERAL MASS FUSION   TOTAL KNEE ARTHROPLASTY Right 04/07/2020   Procedure: TOTAL KNEE ARTHROPLASTY;  Surgeon: Rubie Kemps, MD;  Location: [facility] ORS;  Service: Orthopedics;  Laterality: Right;   TRANSTHORACIC ECHOCARDIOGRAM  04/26/2022   EF 60 to 65%, normal LV function, no RWMA, mild concentric LVH, G1 DD, normal RV systolic function, no significant valvular abnormalities.   Social History   Occupational History   Occupation: retired Psychologist, educational  Tobacco Use   Smoking status: Never   Smokeless tobacco: Never  Vaping Use   Vaping status: Never Used  Substance and Sexual Activity   Alcohol use: No   Drug use: No   Sexual activity: Not on file

## 2024-01-05 NOTE — Progress Notes (Signed)
 Pain Scale   Average Pain 8 Patient advising he has chronic lower back pain that increases when walking and standing. Patient advising sitting helps decrease his pain.        +Driver, -BT, -Dye Allergies.

## 2024-01-10 ENCOUNTER — Ambulatory Visit
Admission: RE | Admit: 2024-01-10 | Discharge: 2024-01-10 | Disposition: A | Discharge status: Home / Self Care | Source: Ambulatory Visit | Attending: Gastroenterology | Admitting: Gastroenterology

## 2024-01-10 DIAGNOSIS — R197 Diarrhea, unspecified: Secondary | ICD-10-CM

## 2024-01-10 MED ORDER — IOPAMIDOL (ISOVUE-370) INJECTION 76%
100.0000 mL | Freq: Once | INTRAVENOUS | Status: AC | PRN
  Administered 2024-01-10: 100 mL via INTRAVENOUS

## 2024-01-30 ENCOUNTER — Ambulatory Visit: Admitting: Physical Medicine and Rehabilitation

## 2024-01-30 ENCOUNTER — Other Ambulatory Visit: Payer: Self-pay

## 2024-01-30 VITALS — BP 115/63 | HR 88

## 2024-01-30 DIAGNOSIS — M5416 Radiculopathy, lumbar region: Secondary | ICD-10-CM

## 2024-01-30 MED ORDER — METHYLPREDNISOLONE ACETATE 40 MG/ML IJ SUSP
40.0000 mg | Freq: Once | INTRAMUSCULAR | Status: AC
Start: 2024-01-30 — End: 2024-01-30
  Administered 2024-01-30: 40 mg

## 2024-01-30 NOTE — Progress Notes (Signed)
 Pain Scale   Average Pain 8 Patient advising he has lower back pain radiating to bilateral hips and pain increases when standing and walking, pain lessens when sitting and resting        +Driver, -BT, -Dye Allergies.

## 2024-01-30 NOTE — Progress Notes (Signed)
 Daniel [name] - 89 y.o. male MRN [id number]  Date of birth: [birth year]  Office Visit Note: Visit Date: 01/30/2024 PCP: Shepard Ade, MD Referred by: Shepard Ade, MD  Subjective: Chief Complaint  Patient presents with   Lower Back - Pain   HPI:  Daniel [name] is a 89 y.o. male who comes in today at the request of Duwaine Pouch, FNP for planned Right L4-5 Lumbar Transforaminal epidural steroid injection with fluoroscopic guidance.  The patient has failed conservative care including home exercise, medications, time and activity modification.  This injection will be diagnostic and hopefully therapeutic.  Please see requesting physician notes for further details and justification.   ROS Otherwise per HPI.  Assessment & Plan: Visit Diagnoses:    ICD-10-CM   1. Lumbar radiculopathy  M54.16 XR C-ARM NO REPORT    Epidural Steroid injection    methylPREDNISolone  acetate (DEPO-MEDROL ) injection 40 mg      Plan: No additional findings.   Meds & Orders:  Meds ordered this encounter  Medications   methylPREDNISolone  acetate (DEPO-MEDROL ) injection 40 mg    Orders Placed This Encounter  Procedures   XR C-ARM NO REPORT   Epidural Steroid injection    Follow-up: Return for visit to requesting provider as needed.   Procedures: No procedures performed  Lumbosacral Transforaminal Epidural Steroid Injection - Sub-Pedicular Approach with Fluoroscopic Guidance  Patient: Daniel [name]      Date of Birth: 13-Feb-1933 MRN: [id number] PCP: Shepard Ade, MD      Visit Date: 01/30/2024   Universal Protocol:    Date/Time: 01/30/2024  Consent Given By: the patient  Position: PRONE  Additional Comments: Vital signs were monitored before and after the procedure. Patient was prepped and draped in the usual sterile fashion. The correct patient, procedure, and site was verified.   Injection Procedure Details:   Procedure diagnoses: Lumbar radiculopathy [M54.16]    Meds  Administered:  Meds ordered this encounter  Medications   methylPREDNISolone  acetate (DEPO-MEDROL ) injection 40 mg    Laterality: Right  Location/Site: L4  Needle:5.0 in., 22 ga.  Short bevel or Quincke spinal needle  Needle Placement: Transforaminal  Findings:    -Comments: Excellent flow of contrast along the nerve, nerve root and into the epidural space.  Procedure Details: After squaring off the end-plates to get a true AP view, the C-arm was positioned so that an oblique view of the foramen as noted above was visualized. The target area is just inferior to the nose of the scotty dog or sub pedicular. The soft tissues overlying this structure were infiltrated with 2-3 ml. of 1% Lidocaine  without Epinephrine .  The spinal needle was inserted toward the target using a trajectory view along the fluoroscope beam.  Under AP and lateral visualization, the needle was advanced so it did not puncture dura and was located close the 6 O'Clock position of the pedical in AP tracterory. Biplanar projections were used to confirm position. Aspiration was confirmed to be negative for CSF and/or blood. A 1-2 ml. volume of Isovue -250 was injected and flow of contrast was noted at each level. Radiographs were obtained for documentation purposes.   After attaining the desired flow of contrast documented above, a 0.5 to 1.0 ml test dose of 0.25% Marcaine  was injected into each respective transforaminal space.  The patient was observed for 90 seconds post injection.  After no sensory deficits were reported, and normal lower extremity motor function was noted,   the above injectate was administered so  that equal amounts of the injectate were placed at each foramen (level) into the transforaminal epidural space.   Additional Comments:  The patient tolerated the procedure well Dressing: 2 x 2 sterile gauze and Band-Aid    Post-procedure details: Patient was observed during the procedure. Post-procedure  instructions were reviewed.  Patient left the clinic in stable condition.    Clinical History: 05/19/2017 NCV & EMG Findings: Extensive electrodiagnostic testing of the left upper and lower extremity shows:  1. Right median, radial, and ulnar sensory responses are within normal limits. 2. Right median and ulnar motor responses are within normal limits. 3. Right sural and superficial peroneal sensory responses are absent. 4. Right tibial motor response shows reduced amplitude and slowed conduction velocity. The peroneal motor response is absent at the extensor digitorum brevis, and normal at the tibialis anterior. 5. Right tibial H reflex study shows prolonged latency. 6. There is no evidence of active or chronic motor axon loss changes affecting any of the tested muscles in the left upper extremity. 7. Chronic motor axon loss changes are seen affecting the left gastrocnemius and biceps femoris short head muscles, without accompanied active denervation.   Impression: 1. 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  A superimposed S1 radiculopathy affecting the left lower extremity is also likely. 2. There is no evidence of a sensorimotor neuropathy or cervical radiculopathy affecting the left upper extremity.     ___________________________ [name], DO -------------------------------- MRI lumbar spine:   TECHNIQUE: Sagittal and axial T1 and T2-weighted sequences were performed. Additional sagittal STIR images were performed.   INDICATION: Back pain   COMPARISON: None   FINDINGS:  #  5 mm grade 1 anterolisthesis L4-5 with minimal grade 1 anterolisthesis is L5-S1.  #  Vertebral body heights are well maintained.  #  Trace type stress I Modic endplate change L3-4 and type II at L4-5  #  Conus terminates at T12-L1 without evidence of tethering.  #  Nerve roots appear normal.  #   Incidental findings: None.    #  L1-2: Mild degenerative disc disease. Canal is well-maintained. Neuroforamina patent.  #    #  L2-3: Mild degenerative disc disease. Central canal is well-maintained. Neural foramina are patent.  #    #  L3-4: Mild degenerative disc disease. There is facet and disc bulge causing mild central canal stenosis with mild bilateral foraminal narrowing.  #    #  L4-5: Severe degenerative disc disease. Grade 1 anterolisthesis and laminectomy decompression with facet arthropathy and disc uncovering causing mild central canal and lateral recess stenosis and moderate bilateral foraminal narrowing  #    #  L5-S1: Grade 1 anterolisthesis. Facet arthropathy and disc bulge causes mild central canal stenosis and mild bilateral foraminal narrowing.    IMPRESSION:   1.  L4-5 severe degenerative disc disease with grade 1 anterolisthesis and laminectomy decompression. There is mild residual central canal lateral recess stenosis.   2.  L5-S1 minimal grade 1 anterolisthesis contributes to mild central canal stenosis   Electronically Signed by: Dallas Jubilee on 06/10/2021 1:58 PM     Objective:  VS:  HT:    WT:   BMI:     BP:115/63  HR:88bpm  TEMP: ( )  RESP:  Physical Exam Vitals and nursing note reviewed.  Constitutional:      General: He is not in acute distress.    Appearance: Normal appearance. He is well-developed. He  is not ill-appearing.  HENT:     Head: Normocephalic and atraumatic.     Right Ear: External ear normal.     Left Ear: External ear normal.     Nose: No congestion.  Eyes:     Extraocular Movements: Extraocular movements intact.     Conjunctiva/sclera: Conjunctivae normal.     Pupils: Pupils are equal, round, and reactive to light.  Cardiovascular:     Rate and Rhythm: Normal rate.     Pulses: Normal pulses.     Heart sounds: Normal heart sounds.  Pulmonary:     Effort: Pulmonary effort is normal. No respiratory distress.  Abdominal:      General: There is no distension.     Palpations: Abdomen is soft.  Musculoskeletal:        General: No tenderness or signs of injury.     Cervical back: Normal range of motion and neck supple. No rigidity.     Right lower leg: No edema.     Left lower leg: No edema.     Comments: Patient has good distal strength without clonus.  Skin:    General: Skin is warm and dry.     Findings: No erythema or rash.  Neurological:     General: No focal deficit present.     Mental Status: He is alert and oriented to person, place, and time.     Cranial Nerves: No cranial nerve deficit.     Sensory: No sensory deficit.     Motor: Weakness present. No abnormal muscle tone.     Coordination: Coordination normal.     Gait: Gait abnormal.  Psychiatric:        Mood and Affect: Mood normal.        Behavior: Behavior normal.      Imaging: XR C-ARM NO REPORT Result Date: 01/30/2024 Please see Notes tab for imaging impression.

## 2024-01-30 NOTE — Procedures (Signed)
 Lumbosacral Transforaminal Epidural Steroid Injection - Sub-Pedicular Approach with Fluoroscopic Guidance  Patient: Daniel [name]      Date of Birth: 04/09/1933 MRN: [id number] PCP: Shepard Ade, MD      Visit Date: 01/30/2024   Universal Protocol:    Date/Time: 01/30/2024  Consent Given By: the patient  Position: PRONE  Additional Comments: Vital signs were monitored before and after the procedure. Patient was prepped and draped in the usual sterile fashion. The correct patient, procedure, and site was verified.   Injection Procedure Details:   Procedure diagnoses: Lumbar radiculopathy [M54.16]    Meds Administered:  Meds ordered this encounter  Medications   methylPREDNISolone  acetate (DEPO-MEDROL ) injection 40 mg    Laterality: Right  Location/Site: L4  Needle:5.0 in., 22 ga.  Short bevel or Quincke spinal needle  Needle Placement: Transforaminal  Findings:    -Comments: Excellent flow of contrast along the nerve, nerve root and into the epidural space.  Procedure Details: After squaring off the end-plates to get a true AP view, the C-arm was positioned so that an oblique view of the foramen as noted above was visualized. The target area is just inferior to the nose of the scotty dog or sub pedicular. The soft tissues overlying this structure were infiltrated with 2-3 ml. of 1% Lidocaine  without Epinephrine .  The spinal needle was inserted toward the target using a trajectory view along the fluoroscope beam.  Under AP and lateral visualization, the needle was advanced so it did not puncture dura and was located close the 6 O'Clock position of the pedical in AP tracterory. Biplanar projections were used to confirm position. Aspiration was confirmed to be negative for CSF and/or blood. A 1-2 ml. volume of Isovue -250 was injected and flow of contrast was noted at each level. Radiographs were obtained for documentation purposes.   After attaining the desired flow  of contrast documented above, a 0.5 to 1.0 ml test dose of 0.25% Marcaine  was injected into each respective transforaminal space.  The patient was observed for 90 seconds post injection.  After no sensory deficits were reported, and normal lower extremity motor function was noted,   the above injectate was administered so that equal amounts of the injectate were placed at each foramen (level) into the transforaminal epidural space.   Additional Comments:  The patient tolerated the procedure well Dressing: 2 x 2 sterile gauze and Band-Aid    Post-procedure details: Patient was observed during the procedure. Post-procedure instructions were reviewed.  Patient left the clinic in stable condition.

## 2024-02-21 ENCOUNTER — Encounter: Payer: Self-pay | Admitting: Cardiology

## 2024-02-21 ENCOUNTER — Ambulatory Visit: Attending: Cardiology | Admitting: Cardiology

## 2024-02-21 VITALS — BP 120/60 | HR 69 | Ht 75.0 in | Wt 186.0 lb

## 2024-02-21 DIAGNOSIS — E785 Hyperlipidemia, unspecified: Secondary | ICD-10-CM | POA: Diagnosis not present

## 2024-02-21 DIAGNOSIS — I951 Orthostatic hypotension: Secondary | ICD-10-CM

## 2024-02-21 DIAGNOSIS — I25118 Atherosclerotic heart disease of native coronary artery with other forms of angina pectoris: Secondary | ICD-10-CM

## 2024-02-21 DIAGNOSIS — R0609 Other forms of dyspnea: Secondary | ICD-10-CM

## 2024-02-21 DIAGNOSIS — R002 Palpitations: Secondary | ICD-10-CM

## 2024-02-21 DIAGNOSIS — I10 Essential (primary) hypertension: Secondary | ICD-10-CM | POA: Diagnosis not present

## 2024-02-21 DIAGNOSIS — T466X5D Adverse effect of antihyperlipidemic and antiarteriosclerotic drugs, subsequent encounter: Secondary | ICD-10-CM

## 2024-02-21 DIAGNOSIS — M791 Myalgia, unspecified site: Secondary | ICD-10-CM

## 2024-02-21 MED ORDER — METOPROLOL TARTRATE 50 MG PO TABS: ORAL_TABLET | ORAL | Status: AC

## 2024-02-21 NOTE — Patient Instructions (Signed)
 Medication Instructions:   Change--  Metoprolol  to 25 mg in the morning  and 75 mg in the evening at 8 pm.   If you see no difference contact office- Dr Anner will order  3 day monitor.   *If you need a refill on your cardiac medications before your next appointment, please call your pharmacy*   Lab Work:  If you have labs (blood work) drawn today and your tests are completely normal, you will receive your results only by: MyChart Message (if you have MyChart) OR A paper copy in the mail If you have any lab test that is abnormal or we need to change your treatment, we will call you to review the results.   Testing/Procedures: Not needed   Follow-Up: At [facility], you and your health needs are our priority.  As part of our continuing mission to provide you with exceptional heart care, we have created designated Provider Care Teams.  These Care Teams include your primary Cardiologist (physician) and Advanced Practice Providers (APPs -  Physician Assistants and Nurse Practitioners) who all work together to provide you with the care you need, when you need it.     Your next appointment:    3 to 4 month(s)  The format for your next appointment:   In Person  Provider:   Alm Anner, MD

## 2024-02-21 NOTE — Progress Notes (Unsigned)
 Cardiology Office Note:  .   Date:  02/24/2024  ID:  Daniel [name], DOB August 12, 1932, MRN [id number] PCP: Shepard Ade, MD  [facility] Providers Cardiologist:  Alm Clay, MD     Chief Complaint  Patient presents with   Follow-up    109-month follow-up.   Palpitations    Having fast heart rate spells again.   Coronary Artery Disease    No angina, just stable exertional dyspnea    Patient Profile: .     Daniel [name] is a 89 y.o. male with longstanding cardiac PMH reviewed below who presents for 36-month follow-up.  Past Medical History significant for: CAD. CABG x 3 1997: LIMA to LAD, SVG to OM, SVG to diagonal. LHC 11/04/2004 (angina): Significant LM and three-vessel CAD.  Patent but small LIMA to LAD.  Patent SVG to diagonal and SVG to OM. LHC 11/14/2006 (angina): LM 40%.  Mid LAD 80%.  First OM 99%.  New lesions mid posterior descending 85 and 90%.  LIMA to LAD with 60% distal anastomotic site stenosis.  Patent SVG to diagonal with competitive flow between vein graft to diagonal and i LIMA to LAD.  Patent SVG to OM1.  PCI with BMS 2.25 x 28 mm to mid posterior descending. LHC 04/26/2022 (unstable angina): Two-vessel occlusive CAD.  Continued patency of stent in the PDA.  Patent LIMA to LAD.  Patent SVG to D2.  Patent SVG to OM 2.  Critical stenosis mid SVG.  Successful PCI with DES to SVG to OM 2. Echo 04/26/2022: EF 60 to 65%.  Mild concentric LVH.  Grade I DD.  Mild LAE.  Trivial MR. Labile hypertension. Orthostatic hypotension. Hyperlipidemia.-Statin intolerant.  On Repatha  and Zetia  Lipid panel 06/09/2022: LDL 113, HDL 33, TG 261, total 191. Palpitations.     Daniel [name] was last seen on August 31, 2023 for routine follow-up noting palpitations describing heart racing beating irregularly occurring 3-4 times over the month.  He noted his heart rate getting over 120s episodes.  He increased his metoprolol  dose up to 50 mg twice daily with notable improvement.  No  chest pain but persistent dyspnea exacerbated by activity.  Winded more easily than usual.  Walking to the mailbox (.  No heart failure symptoms of PND, orthopnea or edema.  Plan was to reinitiate Repatha   Subjective  Discussed the use of AI scribe software for clinical note transcription with the patient, who gave verbal consent to proceed.  History of Present Illness Daniel [name] is an 89 year old male with coronary artery disease who presents with palpitations and fatigue.  He experiences palpitations and a sensation of his heart racing, particularly in the mornings after breakfast. The episodes last 45 minutes to an hour, with his heart rate usually less than 100 beats per minute. The palpitations feel mostly like a fast heartbeat, though sometimes he experiences skipped beats. No chest pain, angina symptoms, blood in stool, pass out spells, or shortness of breath when lying down. He has not required nitroglycerin .  He underwent a stent placement in 2023. He is currently taking metoprolol  50 mg twice daily; he recalls that he was previously on a higher dose of 75 mg twice daily. He occasionally takes an extra half dose when symptoms bother him. His current medications include metoprolol  and clopidogrel . He is not on any cholesterol medications.  He recently experienced a fall, which he believes resulted in a fracture in his spine, causing significant pain and difficulty  sitting. He also contracted COVID-19 last week, which he describes as a severe experience, and is currently dealing with a sinus infection for which he is taking Clenamide. He reports feeling weak and lightheaded, particularly during his COVID-19 illness, and lost eight pounds during this period.  He lives alone and manages his daily activities, including housework and cutting the grass, though he requires assistance for tasks that require standing without support. He uses a cane or walker as needed, especially during his  recent illness. He occasionally experiences diarrhea and has been evaluated by a gastroenterologist without a definitive diagnosis.   Cardiovascular ROS: positive for - dyspnea on exertion, palpitations, rapid heart rate, and dyspnea is pretty stable.  Palpitations worse. negative for - chest pain, orthopnea, paroxysmal nocturnal dyspnea, shortness of breath, or syncope or near syncope, TIA or amaurosis fugax     Objective   Current Meds  Cardiac/Pulmonary Sig   clopidogrel  (PLAVIX ) 75 MG tablet TAKE 1 TABLET(75 MG) BY MOUTH DAILY   ezetimibe  (ZETIA ) 10 MG tablet TAKE 1 TABLET(10 MG) BY MOUTH DAILY   loratadine (CLARITIN) 10 MG tablet Take 10 mg by mouth daily.   montelukast  (SINGULAIR ) 10 MG tablet TAKE 1 TABLET(10 MG) BY MOUTH AT BEDTIME   Multiple Vitamin (MULTIVITAMIN WITH MINERALS) TABS tablet Take 1 tablet by mouth daily.   nitroGLYCERIN  (NITROSTAT ) 0.4 MG SL tablet PLACE 1 TABLET UNDER THE TONGUE EVERY 5 MINUTES AS NEEDED FOR CHEST PAIN FOR 3 DOSES. IF NO RELIEF AFTER FIRST DOSE CALL 911    []  metoprolol  tartrate (LOPRESSOR ) 50 MG tablet Take 1 tablet (50 mg total) by mouth 2 (two) times daily.   Other Meds  Medication Sig   acetaminophen  (TYLENOL ) 500 MG tablet Take 1,000 mg by mouth every 8 (eight) hours as needed for mild pain.   azelastine  (ASTELIN ) 0.1 % nasal spray Place 1-2 sprays into both nostrils 2 (two) times daily. (Patient taking differently: Place 1-2 sprays into both nostrils daily as needed for allergies.)   cholecalciferol  (VITAMIN D ) 1000 units tablet Take 1,000 Units by mouth daily.   clindamycin  (CLEOCIN ) 300 MG capsule Take 300 mg by mouth 2 (two) times daily.   Multiple Vitamin (MULTIVITAMIN WITH MINERALS) TABS tablet Take 1 tablet by mouth daily.   tamsulosin  (FLOMAX ) 0.4 MG CAPS capsule Take 0.4 mg by mouth at bedtime.    traMADol  (ULTRAM ) 50 MG tablet Take 100 mg by mouth 2 (two) times daily.   triamcinolone  (NASACORT ) 55 MCG/ACT AERO nasal inhaler Place 1  spray into the nose daily.   vitamin B-12 (CYANOCOBALAMIN ) 1000 MCG tablet Take 1,000 mcg by mouth 2 (two) times daily.    vitamin C (ASCORBIC ACID) 250 MG tablet Take 250 mg by mouth daily.   zolpidem  (AMBIEN ) 10 MG tablet Take 10 mg by mouth at bedtime as needed for sleep.     Studies Reviewed: SABRA   EKG Interpretation Date/Time:  Tuesday February 21 2024 15:47:27 EDT Ventricular Rate:  69 PR Interval:  200 QRS Duration:  74 QT Interval:  386 QTC Calculation: 413 R Axis:   13  Text Interpretation: Normal sinus rhythm Nonspecific ST abnormality When compared with ECG of 08-Mar-2023 13:54, Criteria for Inferior infarct are no longer Present Confirmed by Anner Lenis ([id number]) on 02/21/2024 4:04:58 PM    Labs from PCP dated 02/20/2024: TC 156, TG 227, HDL 34, LDL 77; A1c 5.9; WBC 5.0, Hgb 12.3, PLT 229; Na+ 142, K+ 4.6.  BUN 34/ Cr 1.4.  Gluc 104  Echocardiogram 04/26/2022: Ejection fraction 60-65%; GR 1 DD,; normal aortic and mitral valves. Normal RV size and function. Normal RAP and RVP.  CATH: 04/26/2022; 2 vessel occlusive CAD. Continued patency of stent in the PDA. Patent LIMA to the LAD (previously described as atretic); Patent SVG to second diagonal; Patent SVG to OM2 but with critical stenosis in the mid SVG; Mildly elevated LVEDP 22 mm Hg; Successful PCI of SVG to OM2 with DES. (SYNERGY XD 4.0X16)Transient no reflow that resolved with IC verapamil .   Risk Assessment/Calculations:         Physical Exam:   VS:  BP 120/60   Pulse 69   Ht 6' 3 (1.905 m)   Wt 186 lb (84.4 kg)   SpO2 97%   BMI 23.25 kg/m    Wt Readings from Last 3 Encounters:  02/21/24 186 lb (84.4 kg)  08/31/23 194 lb (88 kg)  03/08/23 189 lb 9.6 oz (86 kg)      GEN: Well nourished, well groomed in no acute distress; healthy-appearing.  Walking with a cane now. NECK: No JVD; No carotid bruits CARDIAC: Normal S1, S2; RRR, no murmurs, rubs, gallops RESPIRATORY:  Clear to auscultation without rales,  wheezing or rhonchi ; nonlabored, good air movement. ABDOMEN: Soft, non-tender, non-distended EXTREMITIES:  No edema; No deformity      ASSESSMENT AND PLAN: .    Problem List Items Addressed This Visit       Cardiology Problems   Hyperlipidemia with target low density lipoprotein (LDL) cholesterol less than 55 mg/dL (Chronic)   He is only on Zetia  10 mg daily.  Goal LDL is less than 55, but has been intolerant of multiple statins. The plan) to restart Repatha , but he clearly has not.  With his lipids being relatively well-controlled-LDL of 77, he is inclined to simply continue his current meds and not add any new medicines.      Relevant Medications   metoprolol  tartrate (LOPRESSOR ) 50 MG tablet   Other Relevant Orders   EKG 12-Lead (Completed)   Labile essential hypertension (Chronic)   BP stable on current meds. We will adjust dosing intervals of metoprolol , but continue the current daily dose. He is no longer on losartan  because of orthostatic hypotension. Switching metoprolol  to 25 mg in the morning and 75 mg p.m.      Relevant Medications   metoprolol  tartrate (LOPRESSOR ) 50 MG tablet   LM-CAD:  LIMA-LAD, patent SVG-D2 & SVG-OM2 (DES PCI) , BMS PCI rPDA (Chronic)   No active anginal type chest pain since PCI in November 2023. He has exertional dyspnea probably due to deconditioning and musculoskeletal issues.  No PND orthopnea to suggest CHF. - Continue maintenance dose Plavix  75 mg daily  Okay to hold Plavix  5 to 7 days preop for surgeries or procedures.  Okay to hold Plavix  5 to 7 days for severe bleeding or bruising. - Continue metoprolol  which were converting to 25 mg in the morning and 75 mg p.m. -Continue 10 mg Zetia . He had been on Repatha , but I do not see that it is currently listed.      Relevant Medications   metoprolol  tartrate (LOPRESSOR ) 50 MG tablet   Orthostatic hypotension (Chronic)   Relevant Medications   metoprolol  tartrate (LOPRESSOR ) 50 MG  tablet     Other   DOE (dyspnea on exertion) (Chronic)   Relevant Orders   EKG 12-Lead (Completed)   Heart palpitations - Primary (Chronic)   Palpitations and tachycardia in the setting of essential  hypertension Intermittent palpitations and tachycardia, primarily postprandial. Heart rate <100 bpm, no angina or heart failure. Differential includes atrial fibrillation. Recent COVID-19 may exacerbate symptoms. - Adjust metoprolol  to 75 mg at night, 25 mg in the morning. - Consider 3-day heart monitor if symptoms persist. - Ensure adequate hydration post-COVID-19.      Relevant Orders   EKG 12-Lead (Completed)   Myalgia due to statin (Chronic)   Other Visit Diagnoses       Essential hypertension       Relevant Medications   metoprolol  tartrate (LOPRESSOR ) 50 MG tablet   Other Relevant Orders   EKG 12-Lead (Completed)       Recent COVID-19 infection with post-viral fatigue Post-viral fatigue and lightheadedness with dehydration, no fever or respiratory distress. - Encourage increased fluid intake. - Monitor energy levels and lightheadedness.  Recent fall with back and left hip pain Back and left hip pain post-fall, no fractures, significant pain. - Advise caution with movements to prevent falls.  Acute sinusitis Acute sinusitis post-COVID-19, on antibiotics. - Continue antibiotic regimen.  Chronic functional diarrhea, under evaluation Chronic functional diarrhea, under gastroenterology evaluation. Assessment and Plan         Follow-Up: Return in about 4 months (around 06/22/2024) for Routine follow up with me, [employer].     Signed, Alm MICAEL Clay, MD, MS Alm Clay, M.D., M.S. Interventional Cardiologist  [facility] Pager # [phone]

## 2024-02-24 ENCOUNTER — Encounter: Payer: Self-pay | Admitting: Cardiology

## 2024-02-24 NOTE — Assessment & Plan Note (Signed)
 No active anginal type chest pain since PCI in November 2023. He has exertional dyspnea probably due to deconditioning and musculoskeletal issues.  No PND orthopnea to suggest CHF. - Continue maintenance dose Plavix  75 mg daily  Okay to hold Plavix  5 to 7 days preop for surgeries or procedures.  Okay to hold Plavix  5 to 7 days for severe bleeding or bruising. - Continue metoprolol  which were converting to 25 mg in the morning and 75 mg p.m. -Continue 10 mg Zetia . He had been on Repatha , but I do not see that it is currently listed.

## 2024-02-24 NOTE — Assessment & Plan Note (Signed)
 BP stable on current meds. We will adjust dosing intervals of metoprolol , but continue the current daily dose. He is no longer on losartan  because of orthostatic hypotension. Switching metoprolol  to 25 mg in the morning and 75 mg p.m.

## 2024-02-24 NOTE — Assessment & Plan Note (Signed)
 He is only on Zetia  10 mg daily.  Goal LDL is less than 55, but has been intolerant of multiple statins. The plan) to restart Repatha , but he clearly has not.  With his lipids being relatively well-controlled-LDL of 77, he is inclined to simply continue his current meds and not add any new medicines.

## 2024-02-24 NOTE — Assessment & Plan Note (Signed)
 Palpitations and tachycardia in the setting of essential hypertension Intermittent palpitations and tachycardia, primarily postprandial. Heart rate <100 bpm, no angina or heart failure. Differential includes atrial fibrillation. Recent COVID-19 may exacerbate symptoms. - Adjust metoprolol  to 75 mg at night, 25 mg in the morning. - Consider 3-day heart monitor if symptoms persist. - Ensure adequate hydration post-COVID-19.

## 2024-03-02 ENCOUNTER — Encounter: Payer: Self-pay | Admitting: Physical Medicine and Rehabilitation

## 2024-03-15 ENCOUNTER — Ambulatory Visit: Admitting: Physical Medicine and Rehabilitation

## 2024-03-15 ENCOUNTER — Encounter: Payer: Self-pay | Admitting: Physical Medicine and Rehabilitation

## 2024-03-15 DIAGNOSIS — M961 Postlaminectomy syndrome, not elsewhere classified: Secondary | ICD-10-CM

## 2024-03-15 DIAGNOSIS — R269 Unspecified abnormalities of gait and mobility: Secondary | ICD-10-CM

## 2024-03-15 DIAGNOSIS — G8929 Other chronic pain: Secondary | ICD-10-CM

## 2024-03-15 DIAGNOSIS — M5442 Lumbago with sciatica, left side: Secondary | ICD-10-CM

## 2024-03-15 DIAGNOSIS — M5416 Radiculopathy, lumbar region: Secondary | ICD-10-CM | POA: Diagnosis not present

## 2024-03-15 MED ORDER — DULOXETINE HCL 30 MG PO CPEP: ORAL_CAPSULE | ORAL | 3 refills | Status: DC

## 2024-03-15 NOTE — Progress Notes (Signed)
 Pain Scale   Average Pain 8 Patient advising he has Chronic  lower back pain that radiates to both hips without relief        +Driver, -BT, -Dye Allergies.

## 2024-03-15 NOTE — Progress Notes (Signed)
 Daniel [name] - 89 y.o. male MRN [id number]  Date of birth: 08-28-1932  Office Visit Note: Visit Date: 03/15/2024 PCP: Shepard Ade, MD Referred by: Shepard Ade, MD  Subjective: Chief Complaint  Patient presents with   Lower Back - Pain   HPI: Daniel [name] is a 89 y.o. male who comes in today for evaluation of chronic, worsening and severe bilateral lower back pain. Intermittent pain to left great toe and numbness/tingling to left lower extremity. He is here today in follow up. He underwent right L4 transforaminal epidural steroid injection in our office on 01/30/2024. He fell in bedroom on 01/31/2024, states he lost his balance and landed on trash can. He is essentially unable to determine if injection helped with his pain.   Pain ongoing for several years, worsens with prolonged standing and walking. He describes pain as sore and aching sensation, currently rates as 8 out of 10. Sitting seems to alleviate his pain. Some relief of pain with home exercise regimen, rest and use of medications. History of formal physical therapy/dry needling with short term relief of pain. He continues chronic pain management with his primary care provider Dr. Ade Shepard, he does take Tramadol  as needed. Lumbar MRI from January of 2023 exhibits previous laminectomy decompression at L4-L5, there is also grade 1 anterolisthesis, facet arthropathy, mild central canal stenosis and lateral recess/foraminal narrowing noted at this level. There is facet arthropathy and mild spinal canal stenosis noted at L5-S1. He is currently using cane to assist with ambulation.  History of central decompressive laminectomy at the level of L4-L5 in 2010 by Dr. Lynwood Better. He has undergone multiple lumbar radiofrequency ablation procedures over the years with significant relief of pain. He did experience left leg paresthesias following radiofrequency ablation in 2023, however these symptoms did eventually subside. He did  undergo NCV with EMG by Dr. Eldonna in June 2023 that exhibits peripheral neuropathy of the left lower extremity. History of myofascial trigger point injection to right gluteal region in January of 2025, some relief of pain for 2 days with this procedure. Of note, history of bilateral L4 transforaminal epidural steroid injection in our office on 12/28/2021, he is unable to recall if this procedure was beneficial in alleviating his pain.    More recently, he underwent right L4-L5 and L5-S1 radiofrequency ablation in our office on 10/19/2023. No relief of pain with this procedure.       Review of Systems  Musculoskeletal:  Positive for back pain.  Neurological:  Positive for tingling. Negative for focal weakness and weakness.  All other systems reviewed and are negative.  Otherwise per HPI.  Assessment & Plan: Visit Diagnoses:    ICD-10-CM   1. Chronic bilateral low back pain with left-sided sciatica  G89.29    M54.42     2. Lumbar radiculopathy  M54.16     3. Post laminectomy syndrome  M96.1     4. Gait abnormality  R26.9        Plan: Findings:  Chronic, worsening and severe bilateral lower back pain. Intermittent pain to left great toe and numbness/tingling to left lower extremity. Bilateral lower back pain seems to be biggest pain generator. Patient continues to have severe pain despite good conservative therapies such as formal physical therapy, dry needling, home exercise regimen, rest and use of medications. Patients clinical presentation and exam are complex, his symptoms today seem to be more radicular in nature. We have tried multiple interventional spine procedures such as lumbar epidural  steroid injections and lumbar radiofrequency ablation with minimal relief of pain. We discussed treatment plan in detail today. At this point, would not recommend continuing with interventional spine procedures. We discussed medication management, I prescribed Cymbalta for him to try. I would like  to see him back in 1 month for re-evaluation. Could also look at re-grouping with physical therapy and lumbar brace. Also spoke with him about obtaining new lumbar MRI imaging. He has no questions at this time. No red flag symptoms noted upon exam today.     Meds & Orders:  Meds ordered this encounter  Medications   DULoxetine (CYMBALTA) 30 MG capsule    Sig: Take 1 capsule (30 mg total) once a day by mouth for 2 weeks, then take 1 capsule (30 mg) twice a day.    Dispense:  60 capsule    Refill:  3   No orders of the defined types were placed in this encounter.   Follow-up: Return for 1 month follow up for medication management.   Procedures: No procedures performed      Clinical History: 05/19/2017 NCV & EMG Findings: Extensive electrodiagnostic testing of the left upper and lower extremity shows:  1. Right median, radial, and ulnar sensory responses are within normal limits. 2. Right median and ulnar motor responses are within normal limits. 3. Right sural and superficial peroneal sensory responses are absent. 4. Right tibial motor response shows reduced amplitude and slowed conduction velocity. The peroneal motor response is absent at the extensor digitorum brevis, and normal at the tibialis anterior. 5. Right tibial H reflex study shows prolonged latency. 6. There is no evidence of active or chronic motor axon loss changes affecting any of the tested muscles in the left upper extremity. 7. Chronic motor axon loss changes are seen affecting the left gastrocnemius and biceps femoris short head muscles, without accompanied active denervation.   Impression: 1. 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  A superimposed S1 radiculopathy affecting the left lower extremity is also likely. 2. There is no evidence of a sensorimotor neuropathy or cervical radiculopathy affecting the left upper  extremity.     ___________________________ [name], DO -------------------------------- MRI lumbar spine:   TECHNIQUE: Sagittal and axial T1 and T2-weighted sequences were performed. Additional sagittal STIR images were performed.   INDICATION: Back pain   COMPARISON: None   FINDINGS:  #  5 mm grade 1 anterolisthesis L4-5 with minimal grade 1 anterolisthesis is L5-S1.  #  Vertebral body heights are well maintained.  #  Trace type stress I Modic endplate change L3-4 and type II at L4-5  #  Conus terminates at T12-L1 without evidence of tethering.  #  Nerve roots appear normal.  #  Incidental findings: None.    #  L1-2: Mild degenerative disc disease. Canal is well-maintained. Neuroforamina patent.  #    #  L2-3: Mild degenerative disc disease. Central canal is well-maintained. Neural foramina are patent.  #    #  L3-4: Mild degenerative disc disease. There is facet and disc bulge causing mild central canal stenosis with mild bilateral foraminal narrowing.  #    #  L4-5: Severe degenerative disc disease. Grade 1 anterolisthesis and laminectomy decompression with facet arthropathy and disc uncovering causing mild central canal and lateral recess stenosis and moderate bilateral foraminal narrowing  #    #  L5-S1: Grade 1 anterolisthesis. Facet arthropathy and disc bulge causes mild central  canal stenosis and mild bilateral foraminal narrowing.    IMPRESSION:   1.  L4-5 severe degenerative disc disease with grade 1 anterolisthesis and laminectomy decompression. There is mild residual central canal lateral recess stenosis.   2.  L5-S1 minimal grade 1 anterolisthesis contributes to mild central canal stenosis   Electronically Signed by: Dallas Jubilee on 06/10/2021 1:58 PM   He reports that he has never smoked. He has never used smokeless tobacco. No results for input(s): HGBA1C, LABURIC in the last 8760 hours.  Objective:  VS:  HT:    WT:   BMI:     BP:   HR: bpm   TEMP: ( )  RESP:  Physical Exam Vitals and nursing note reviewed.  HENT:     Head: Normocephalic and atraumatic.     Right Ear: External ear normal.     Left Ear: External ear normal.     Nose: Nose normal.     Mouth/Throat:     Mouth: Mucous membranes are moist.  Eyes:     Extraocular Movements: Extraocular movements intact.  Cardiovascular:     Rate and Rhythm: Normal rate.     Pulses: Normal pulses.  Pulmonary:     Effort: Pulmonary effort is normal.  Abdominal:     General: Abdomen is flat. There is no distension.  Musculoskeletal:        General: Tenderness present.     Cervical back: Normal range of motion.     Comments: Patient is slow to rise from seated position to standing. Good lumbar range of motion. No pain noted with facet loading. 5/5 strength noted with bilateral hip flexion, knee flexion/extension, ankle dorsiflexion/plantarflexion and EHL. No clonus noted bilaterally. No pain upon palpation of greater trochanters. No pain with internal/external rotation of bilateral hips. Sensation intact bilaterally. Negative slump test bilaterally. Ambulates with cane, gait slow and unsteady.    Skin:    General: Skin is warm and dry.     Capillary Refill: Capillary refill takes less than 2 seconds.  Neurological:     General: No focal deficit present.     Mental Status: He is alert and oriented to person, place, and time.  Psychiatric:        Mood and Affect: Mood normal.        Behavior: Behavior normal.     Ortho Exam  Imaging: No results found.  Past Medical/Family/Surgical/Social History: Medications & Allergies reviewed per EMR, new medications updated. Patient Active Problem List   Diagnosis Date Noted   Pedal edema 09/03/2023   Abnormal weight loss 03/12/2023   Nodule of left anterior chest wall 03/12/2023   Myalgia due to statin 05/22/2021   Branch retinal vein occlusion of left eye with macular edema (HCC) 09/16/2020   Chronic pain of right knee  03/19/2020   Hypertensive retinopathy of both eyes 11/13/2017   Stenosis of cervical spine with myelopathy (HCC) 07/14/2017   Pseudophakia of both eyes 05/24/2017   Stable branch retinal vein occlusion of right eye (HCC) 05/24/2017   DOE (dyspnea on exertion) 03/10/2017   Acute maxillary sinusitis 02/15/2017   Allergic rhinitis 02/15/2017   Preoperative cardiovascular examination 08/16/2016   Chronic pain of both knees 07/13/2016   Fatigue due to treatment 09/11/2015   Unstable angina (HCC) 08/06/2015   Low TSH level 08/13/2014   Orthostatic hypotension 12/17/2013   Pseudoptosis 05/22/2013   Dermatochalasis 02/21/2013   Epiphora 02/21/2013   S/P CABG x 3    Labile essential hypertension  Hyperlipidemia with target low density lipoprotein (LDL) cholesterol less than 55 mg/dL    Heart palpitations    Nuclear cataract 01/26/2012   PCO (posterior capsular opacification) 01/26/2012   Pseudophakia 01/26/2012   LM-CAD:  LIMA-LAD, patent SVG-D2 & SVG-OM2 (DES PCI) , BMS PCI rPDA 07/27/1995   Past Medical History:  Diagnosis Date   Arthritis    CAD of autologous arterial graft 2010   a) ~2010 & 2017 - LIMA-LAD was atretic (LAD filling via SVG-D2); b)  04/2022: LIMA-LAD now fully patent, 85% mSVG-OM2 =? DES PCI   CAD S/P percutaneous coronary angioplasty 11/2006   a. 6/'08: PCI nongrafted distal RCA-PDA: Mini vision BMS 2.25 mm x 28 mm; b. Relook Cath 08/2015: stable,: atretic LIMA;; 04/2022: 85% SVG-OM2  => DES PCI Synergy XD 4.0 mmx16 mm); LM 60%, pLAD 70%, p-m LAD 100% & p-m LCX 100%; 40% dRCA with patent rPDA BMS; SVG-Diag & LIMA-LAD patent   Complication of anesthesia    Coronary artery disease involving left main coronary artery [id number]   Last cath 2010: 70-80 % ostial left main, 80-90% LAD, subtotal CTO LCx-OM2, RCA patent w/ patent BMS stent (dRCA-PDA); SVG-diagonal backfills LAD, atretic LIMA-LAD -- medical therapy; stable by relook cath in March 2017   Difficult intubation     VERY NARROW AIRWAY PER ANESTHESIOLOGIST (1997 CABG AT [facility])   Dyslipidemia, goal LDL below 70    Dysrhythmia    Heart palpitations    Never fully diagnosed.   History of kidney stones    Hypertension    Insomnia    Mass in chest    JUST WATCHING , WILL CHECK OUT AFTER SURGERY   Pneumonia    S/P CABG x 3 1997   LIMA-LAD, SVG-D2, SVG-OM --> LIMA known to be atretic, but SVG-D2 fills diagonal and LAD   Stenosis of cervical spine with myelopathy (HCC)    Tingling    in fingers   Family History  Problem Relation Age of Onset   Stroke Mother 98   Heart disease Mother    Arthritis Mother    Heart attack Father 53   Other Sister    Heart disease Brother    Allergic rhinitis Neg Hx    Atopy Neg Hx    Angioedema Neg Hx    Asthma Neg Hx    Eczema Neg Hx    Immunodeficiency Neg Hx    Urticaria Neg Hx    Past Surgical History:  Procedure Laterality Date   48-HOUR MONITOR  03/2017   Relatively normal.  Very infrequent ectopy.  Short PAT run with occasional PACs/PVCs and bigeminy.  No symptoms noted on monitor.  Longest PAT runs were less than 10 beats.   BACK SURGERY     decompression  lower back    CARDIAC CATHETERIZATION N/A 08/08/2015   Procedure: Left Heart Cath and Cors/Grafts Angiography;  Surgeon: Alm LELON Clay, MD;  Location: [facility] INVASIVE CV LAB;; -->  LM 80%-70%. Ost LAD 80% then prox-mid 100% (after small D1). Small LCx & OM2 patent with 100% OM2. Prox & distal RCA 20%, patent BMS in distal RCA-rPDA.  Patent/atretic LIMA-dLAD, SVG-D2 (backdills entire dLAD) & SVG-OM2.    COLONOSCOPY     CORONARY ANGIOPLASTY WITH STENT PLACEMENT  11/2006   BMS PCI of distal RCA-RPDA: PCI with a 2.25 mm x 28 mm Mini Vision bare-metal stent.   CORONARY ARTERY BYPASS GRAFT  [id number]   LIMA - LAD, SVG- diagonal, SVG-OM   CORONARY STENT INTERVENTION N/A 04/26/2022  Procedure: CORONARY STENT INTERVENTION;  Surgeon: Swaziland, Peter M, MD;  Location: [facility] INVASIVE CV LAB;  Service: CV: 95% mid SVG-OM2  => DES PCI (Synergy XD DES 4.0 mm x 16 mm) -> complicated by transient no reflow resolved with IC verapamil    EYE SURGERY     BIL CATARACT   KIDNEY STONE SURGERY     KNEE ARTHROSCOPY     RT  KNEE   LEFT HEART CATH AND CORS/GRAFTS ANGIOGRAPHY  11/2006   High-grade distal RCA and PDA stenosis --> BMS PCI.    LEFT HEART CATH AND CORS/GRAFTS ANGIOGRAPHY  2010   Native coronaries at 70% to 80% ostial left main. LAD had diffuse 80% to 90% stenosis   LEFT HEART CATH AND CORS/GRAFTS ANGIOGRAPHY N/A 04/26/2022   Procedure: LEFT HEART CATH AND CORS/GRAFTS ANGIOGRAPHY;  Surgeon: Swazil[place], Peter M, MD;  Location: [facility] INVASIVE CV LAB;  Service: CV::   2 V Occlusive Native CAD: 60% LM, 70% ostial-prox LAD, 100% mid LAD, 1 100% prox-mid LCx; 40% distRCA with patent PDA stent.  Patent LIMA-LAD, SVG-D2. 95% mid SVG-OM2 => DES PCI   NM MYOVIEW  LTD  03/17/2017   No significant reversible ischemia. Increased TID ratio 1.34. LVEF 54% with normal wall motion. This is a low risk study.   POSTERIOR CERVICAL FUSION/FORAMINOTOMY N/A 07/14/2017   Procedure: LAMINECTOMY CERVICAL THREE- CERVICAL FOUR WITH LATERAL MASS FUSION;  Surgeon: Lanis Pupa, MD;  Location: [facility] OR;  Service: Neurosurgery;  Laterality: N/A;  LAMINECTOMY CERVICAL 3- CERVICAL 4 WITH LATERAL MASS FUSION   TOTAL KNEE ARTHROPLASTY Right 04/07/2020   Procedure: TOTAL KNEE ARTHROPLASTY;  Surgeon: Rubie Kemps, MD;  Location: [facility] ORS;  Service: Orthopedics;  Laterality: Right;   TRANSTHORACIC ECHOCARDIOGRAM  04/26/2022   EF 60 to 65%, normal LV function, no RWMA, mild concentric LVH, G1 DD, normal RV systolic function, no significant valvular abnormalities.   Social History   Occupational History   Occupation: retired Psychologist, educational  Tobacco Use   Smoking status: Never   Smokeless tobacco: Never  Vaping Use   Vaping status: Never Used  Substance and Sexual Activity   Alcohol use: No   Drug use: No   Sexual activity: Not on file

## 2024-03-23 ENCOUNTER — Emergency Department (HOSPITAL_COMMUNITY)

## 2024-03-23 ENCOUNTER — Other Ambulatory Visit: Payer: Self-pay

## 2024-03-23 ENCOUNTER — Encounter (HOSPITAL_COMMUNITY): Payer: Self-pay

## 2024-03-23 ENCOUNTER — Inpatient Hospital Stay (HOSPITAL_COMMUNITY)
Admission: EM | Admit: 2024-03-23 | Discharge: 2024-03-29 | DRG: 481 | Disposition: A | Discharge status: Skilled Nursing Facility | Attending: Internal Medicine | Admitting: Internal Medicine

## 2024-03-23 DIAGNOSIS — S72144A Nondisplaced intertrochanteric fracture of right femur, initial encounter for closed fracture: Principal | ICD-10-CM | POA: Diagnosis present

## 2024-03-23 DIAGNOSIS — I25810 Atherosclerosis of coronary artery bypass graft(s) without angina pectoris: Secondary | ICD-10-CM | POA: Diagnosis present

## 2024-03-23 DIAGNOSIS — Z888 Allergy status to other drugs, medicaments and biological substances status: Secondary | ICD-10-CM

## 2024-03-23 DIAGNOSIS — Z955 Presence of coronary angioplasty implant and graft: Secondary | ICD-10-CM

## 2024-03-23 DIAGNOSIS — S72001A Fracture of unspecified part of neck of right femur, initial encounter for closed fracture: Principal | ICD-10-CM

## 2024-03-23 DIAGNOSIS — N400 Enlarged prostate without lower urinary tract symptoms: Secondary | ICD-10-CM | POA: Diagnosis present

## 2024-03-23 DIAGNOSIS — I2510 Atherosclerotic heart disease of native coronary artery without angina pectoris: Secondary | ICD-10-CM | POA: Diagnosis present

## 2024-03-23 DIAGNOSIS — Z7902 Long term (current) use of antithrombotics/antiplatelets: Secondary | ICD-10-CM

## 2024-03-23 DIAGNOSIS — Z7901 Long term (current) use of anticoagulants: Secondary | ICD-10-CM

## 2024-03-23 DIAGNOSIS — Z880 Allergy status to penicillin: Secondary | ICD-10-CM

## 2024-03-23 DIAGNOSIS — Z981 Arthrodesis status: Secondary | ICD-10-CM

## 2024-03-23 DIAGNOSIS — Z87442 Personal history of urinary calculi: Secondary | ICD-10-CM

## 2024-03-23 DIAGNOSIS — Z23 Encounter for immunization: Secondary | ICD-10-CM

## 2024-03-23 DIAGNOSIS — D696 Thrombocytopenia, unspecified: Secondary | ICD-10-CM | POA: Diagnosis present

## 2024-03-23 DIAGNOSIS — Z823 Family history of stroke: Secondary | ICD-10-CM

## 2024-03-23 DIAGNOSIS — Z602 Problems related to living alone: Secondary | ICD-10-CM | POA: Diagnosis present

## 2024-03-23 DIAGNOSIS — Z7409 Other reduced mobility: Secondary | ICD-10-CM | POA: Diagnosis present

## 2024-03-23 DIAGNOSIS — Z8261 Family history of arthritis: Secondary | ICD-10-CM

## 2024-03-23 DIAGNOSIS — D62 Acute posthemorrhagic anemia: Secondary | ICD-10-CM | POA: Diagnosis present

## 2024-03-23 DIAGNOSIS — I129 Hypertensive chronic kidney disease with stage 1 through stage 4 chronic kidney disease, or unspecified chronic kidney disease: Secondary | ICD-10-CM | POA: Diagnosis present

## 2024-03-23 DIAGNOSIS — E785 Hyperlipidemia, unspecified: Secondary | ICD-10-CM | POA: Diagnosis present

## 2024-03-23 DIAGNOSIS — Y92015 Private garage of single-family (private) house as the place of occurrence of the external cause: Secondary | ICD-10-CM

## 2024-03-23 DIAGNOSIS — Z8249 Family history of ischemic heart disease and other diseases of the circulatory system: Secondary | ICD-10-CM

## 2024-03-23 DIAGNOSIS — Z882 Allergy status to sulfonamides status: Secondary | ICD-10-CM

## 2024-03-23 DIAGNOSIS — G4700 Insomnia, unspecified: Secondary | ICD-10-CM | POA: Diagnosis not present

## 2024-03-23 DIAGNOSIS — Z79899 Other long term (current) drug therapy: Secondary | ICD-10-CM

## 2024-03-23 DIAGNOSIS — Z96651 Presence of right artificial knee joint: Secondary | ICD-10-CM | POA: Diagnosis present

## 2024-03-23 DIAGNOSIS — N179 Acute kidney failure, unspecified: Secondary | ICD-10-CM | POA: Diagnosis present

## 2024-03-23 DIAGNOSIS — J9811 Atelectasis: Secondary | ICD-10-CM | POA: Diagnosis present

## 2024-03-23 DIAGNOSIS — N1831 Chronic kidney disease, stage 3a: Secondary | ICD-10-CM | POA: Diagnosis present

## 2024-03-23 DIAGNOSIS — W010XXA Fall on same level from slipping, tripping and stumbling without subsequent striking against object, initial encounter: Secondary | ICD-10-CM | POA: Diagnosis present

## 2024-03-23 LAB — BASIC METABOLIC PANEL WITH GFR
Anion gap: 11 (ref 5–15)
BUN: 27 mg/dL — ABNORMAL HIGH (ref 8–23)
CO2: 25 mmol/L (ref 22–32)
Calcium: 9.3 mg/dL (ref 8.9–10.3)
Chloride: 104 mmol/L (ref 98–111)
Creatinine, Ser: 1.42 mg/dL — ABNORMAL HIGH (ref 0.61–1.24)
GFR, Estimated: 47 mL/min — ABNORMAL LOW (ref >60)
Glucose, Bld: 119 mg/dL — ABNORMAL HIGH (ref 70–99)
Potassium: 4.2 mmol/L (ref 3.5–5.1)
Sodium: 140 mmol/L (ref 135–145)

## 2024-03-23 LAB — CBC
HCT: 37 % — ABNORMAL LOW (ref 39.0–52.0)
Hemoglobin: 12.3 g/dL — ABNORMAL LOW (ref 13.0–17.0)
MCH: 30.9 pg (ref 26.0–34.0)
MCHC: 33.2 g/dL (ref 30.0–36.0)
MCV: 93 fL (ref 80.0–100.0)
Platelets: 214 K/uL (ref 150–400)
RBC: 3.98 MIL/uL — ABNORMAL LOW (ref 4.22–5.81)
RDW: 13.4 % (ref 11.5–15.5)
WBC: 13.8 K/uL — ABNORMAL HIGH (ref 4.0–10.5)
nRBC: 0 % (ref 0.0–0.2)

## 2024-03-23 MED ORDER — MORPHINE SULFATE (PF) 4 MG/ML IV SOLN
4.0000 mg | Freq: Once | INTRAVENOUS | Status: AC
  Administered 2024-03-23: 4 mg via INTRAVENOUS
  Filled 2024-03-23: qty 1

## 2024-03-23 MED ORDER — OXYCODONE-ACETAMINOPHEN 5-325 MG PO TABS
1.0000 | ORAL_TABLET | Freq: Once | ORAL | Status: AC
  Administered 2024-03-23: 1 via ORAL
  Filled 2024-03-23: qty 1

## 2024-03-23 NOTE — Progress Notes (Signed)
 Orthopedic trauma service  Consult requested regarding weightbearing status for possible isolated right greater trochanter fracture  Patient with significant pain with weightbearing after fall tripping over the lawnmower.  X-rays were read as negative but due to the amount of pain CT was obtained.  Fracture line is evident in the greater trochanter however I do have concerns that it may extend into the femoral neck as well.  Recommend MRI of the right hip for further evaluation  If fracture is isolated to his greater trochanter he can be weightbearing as tolerated but will be restricted from active hip abduction.  If fracture does involve the femoral neck he will require fixation.  Patient may need medical admission for pain control and therapy  Will follow-up once MRI is completed  [name], PA-C [phone] (C) 03/23/2024, 10:39 PM  [facility] [address] [place] [address] [phone] (F)        Patient ID: Daniel [name], male   DOB: 03-05-1933, 89 y.o.   MRN: [id number]

## 2024-03-23 NOTE — ED Provider Notes (Signed)
 Patient signed out to me by previous provider. Please refer to their note for full HPI.  Briefly this is an 89 year old male who had a mechanical fall now complaining of right hip pain, difficulty with weightbearing.  CT scan showing concern for comminuted right greater trochanter fracture.  Patient still having pain.   Patient signed out pending orthopedic consultation.   ED Course: Spoke with on-call PA for Dr. Celena.  After reviewing the CT there was concern that there is extension of the fracture.  They recommend medical admission and MRI of the hip for further evaluation/recommendations.  Currently pain is controlled.  Patients evaluation and results requires admission for further treatment and care.  Spoke with hospitalist, reviewed patient's ED course and they accept admission.  Patient agrees with admission plan, offers no new complaints and is stable/unchanged at time of admit.   Bari Roxie HERO, DO 03/23/24 [id number]

## 2024-03-23 NOTE — ED Provider Notes (Signed)
 [facility] [address] [facility]ER[facility] DEPARTMENT AT [facility] Provider Note   CSN: [id number] Arrival date & time: 03/23/24  1810     Patient presents with: Daniel [name] is a 89 y.o. male with past medical history significant for hypertension, hyperlipidemia, on Plavix  presents concern for mechanical, nonsyncopal fall tripping over his lawnmower.  He landed on his right hip.  He endorses right hip pain, knee, and ankle pain.  Small abrasion/laceration to the right arm.  Unable to bear weight on the right leg at this time.    Fall       Prior to Admission medications   Medication Sig Start Date End Date Taking? Authorizing Provider  acetaminophen  (TYLENOL ) 500 MG tablet Take 1,000 mg by mouth every 8 (eight) hours as needed for mild pain.    [provider]  azelastine  (ASTELIN ) 0.1 % nasal spray Place 1-2 sprays into both nostrils 2 (two) times daily. Patient taking differently: Place 1-2 sprays into both nostrils daily as needed for allergies. 09/09/20   Kozlow, Camellia PARAS, MD  cholecalciferol  (VITAMIN D ) 1000 units tablet Take 1,000 Units by mouth daily.    [provider]  clindamycin  (CLEOCIN ) 300 MG capsule Take 300 mg by mouth 2 (two) times daily. 03/04/21   [provider]  clopidogrel  (PLAVIX ) 75 MG tablet TAKE 1 TABLET(75 MG) BY MOUTH DAILY 04/04/23   Anner Alm ORN, MD  DULoxetine (CYMBALTA) 30 MG capsule Take 1 capsule (30 mg total) once a day by mouth for 2 weeks, then take 1 capsule (30 mg) twice a day. 03/15/24   Williams, Megan E, NP  ezetimibe  (ZETIA ) 10 MG tablet TAKE 1 TABLET(10 MG) BY MOUTH DAILY 06/06/23   Anner Alm ORN, MD  loratadine (CLARITIN) 10 MG tablet Take 10 mg by mouth daily.    [provider]  metoprolol  tartrate (LOPRESSOR ) 50 MG tablet Take 25 mg ( 1/2 tablet of 50 mg) in the morning  and 75 mg  ( 1 and 1/2 tablets) at 8 pm in the evening 02/21/24   Anner Alm ORN, MD  montelukast  (SINGULAIR ) 10 MG tablet TAKE 1  TABLET(10 MG) BY MOUTH AT BEDTIME 08/10/21   Kozlow, Eric J, MD  Multiple Vitamin (MULTIVITAMIN WITH MINERALS) TABS tablet Take 1 tablet by mouth daily.    [provider]  nitroGLYCERIN  (NITROSTAT ) 0.4 MG SL tablet PLACE 1 TABLET UNDER THE TONGUE EVERY 5 MINUTES AS NEEDED FOR CHEST PAIN FOR 3 DOSES. IF NO RELIEF AFTER FIRST DOSE CALL 911 OR MD 04/06/23   Anner Alm ORN, MD  tamsulosin  (FLOMAX ) 0.4 MG CAPS capsule Take 0.4 mg by mouth at bedtime.  06/08/14   [provider]  traMADol  (ULTRAM ) 50 MG tablet Take 100 mg by mouth 2 (two) times daily. 03/16/21   [provider]  triamcinolone  (NASACORT ) 55 MCG/ACT AERO nasal inhaler Place 1 spray into the nose daily. 09/09/20   Kozlow, Eric J, MD  vitamin B-12 (CYANOCOBALAMIN ) 1000 MCG tablet Take 1,000 mcg by mouth 2 (two) times daily.     [provider]  vitamin C (ASCORBIC ACID) 250 MG tablet Take 250 mg by mouth daily.    [provider]  zolpidem  (AMBIEN ) 10 MG tablet Take 10 mg by mouth at bedtime as needed for sleep. 03/24/20   [provider]    Allergies: Penicillins, Sulfa antibiotics, and Statins    Review of Systems  All other systems reviewed and are negative.   Updated Vital  Signs BP (!) 164/72   Pulse 97   Temp 98.6 F (37 C) (Oral)   Resp 15   SpO2 99%   Physical Exam Vitals and nursing note reviewed.  Constitutional:      General: He is not in acute distress.    Appearance: Normal appearance.  HENT:     Head: Normocephalic and atraumatic.  Eyes:     General:        Right eye: No discharge.        Left eye: No discharge.  Cardiovascular:     Rate and Rhythm: Normal rate and regular rhythm.     Heart sounds: No murmur heard.    No friction rub. No gallop.  Pulmonary:     Effort: Pulmonary effort is normal.     Breath sounds: Normal breath sounds.  Abdominal:     General: Bowel sounds are normal.     Palpations: Abdomen is soft.  Skin:    General: Skin is warm  and dry.     Capillary Refill: Capillary refill takes less than 2 seconds.     Comments: Skin tear noted to right arm, no active bleeding  Neurological:     Mental Status: He is alert and oriented to person, place, and time.  Psychiatric:        Mood and Affect: Mood normal.        Behavior: Behavior normal.     (all labs ordered are listed, but only abnormal results are displayed) Labs Reviewed  CBC - Abnormal; Notable for the following components:      Result Value   WBC 13.8 (*)    RBC 3.98 (*)    Hemoglobin 12.3 (*)    HCT 37.0 (*)    All other components within normal limits  BASIC METABOLIC PANEL WITH GFR - Abnormal; Notable for the following components:   Glucose, Bld 119 (*)    BUN 27 (*)    Creatinine, Ser 1.42 (*)    GFR, Estimated 47 (*)    All other components within normal limits    EKG: None  Radiology: CT Hip Right Wo Contrast Result Date: 03/23/2024 EXAM: CT OF THE RIGHT HIP WITHOUT IV CONTRAST 03/23/2024 08:10:23 PM TECHNIQUE: CT of the right hip was performed without the administration of intravenous contrast. Multiplanar reformatted images are provided for review. Automated exposure control, iterative reconstruction, and/or weight based adjustment of the mA/kV was utilized to reduce the radiation dose to as low as reasonably achievable. COMPARISON: Same day hip radiograph. CLINICAL HISTORY: Hip trauma, fracture suspected, xray done. Fall with right hip pain. FINDINGS: BONES: Nondisplaced comminuted fracture of the right greater trochanter. No extension into the femoral diaphysis or femoral neck. SOFT TISSUE: No significant soft tissue edema or fluid collections. No soft tissue mass. JOINT: No osseous erosions. INTRAPELVIC CONTENTS: Limited images of the intrapelvic contents are unremarkable. IMPRESSION: 1. Nondisplaced comminuted fracture of the right greater trochanter. Electronically signed by: Norman Gatlin MD 03/23/2024 08:50 PM EDT RP Workstation: [id number]    DG Knee Complete 4 Views Right Result Date: 03/23/2024 CLINICAL DATA:  fall, right hip pain, unable to bear weight on the right leg EXAM: RIGHT KNEE - COMPLETE 4+ VIEW COMPARISON:  None Available. FINDINGS: Well-aligned knee arthroplasty without acute fracture.No periprosthetic lucency to suggest loosening.No dislocation. Multiple surgical clips in the medial aspect of the calf. No radiopaque foreign body or retained surgical instrument. IMPRESSION: Well-aligned knee arthroplasty without acute fracture or dislocation.No periprosthetic lucency to suggest hardware  loosening. Electronically Signed   By: Rogelia Myers M.D.   On: 03/23/2024 19:43   DG Hip Unilat W or Wo Pelvis 2-3 Views Right Result Date: 03/23/2024 CLINICAL DATA:  fall, right hip pain EXAM: DG HIP (WITH OR WITHOUT PELVIS) 2-3V RIGHT COMPARISON:  None Available. FINDINGS: No evidence of pelvic fracture or diastasis.No acute hip fracture or dislocation.Degenerative disc disease of the lumbar spine. Peripheral vascular atherosclerosis. IMPRESSION: No acute fracture, pelvic bone diastasis, or dislocation. Electronically Signed   By: Rogelia Myers M.D.   On: 03/23/2024 19:41   DG Ankle Complete Right Result Date: 03/23/2024 CLINICAL DATA:  fall EXAM: RIGHT ANKLE - COMPLETE 3+ VIEW COMPARISON:  None Available. FINDINGS: No acute fracture or dislocation. No ankle mortise widening. The talar dome is intact. Advanced midfoot degenerative changes. Surgical clips along the medial calf. IMPRESSION: No acute fracture or dislocation. Electronically Signed   By: Rogelia Myers M.D.   On: 03/23/2024 19:38     Procedures   Medications Ordered in the ED  oxyCODONE -acetaminophen  (PERCOCET/ROXICET) 5-325 MG per tablet 1 tablet (1 tablet Oral Given 03/23/24 1949)  morphine  (PF) 4 MG/ML injection 4 mg (4 mg Intravenous Given 03/23/24 2202)                                    Medical Decision Making Amount and/or Complexity of Data  Reviewed Labs: ordered. Radiology: ordered.  Risk Prescription drug management.   This patient is a 89 y.o. male  who presents to the ED for concern of fall, hip pain, knee pain, ankle pain.   Differential diagnoses prior to evaluation: The emergent differential diagnosis includes, but is not limited to, bruise, fracture, dislocation, versus other soft tissue injury, contusion, sprain. This is not an exhaustive differential.   Past Medical History / Co-morbidities / Social History: hypertension, hyperlipidemia, on Plavix   Physical Exam: Physical exam performed. The pertinent findings include: Significant tenderness to palpation of the right hip, right knee.  Unable to bear weight on the affected side.  Decree strength to flexion, extension at the hip.  No obvious deformity of the knee or ankle although does have some mild tenderness.  Skin tear of the right forearm.  He has been somewhat hypertensive throughout his ED evaluation.  BP 192/74.  Reports that normally does not take any blood pressure medication other than his rate control metoprolol .  Lab Tests/Imaging studies: I personally interpreted labs/imaging and the pertinent results include: If MRI of the right knee, hip, and ankle with no acute fractures or dislocations..  CBC notable for leukocytosis, white blood cell 13.8, suspect secondary to fall.  Anemia, hemoglobin 12.3.  CT right hip shows not significantly displaced but comminuted greater trochanter fracture. I agree with the radiologist interpretation.  Does have elevated creatinine, 1.42, BUN 27.  No significant recent creatinine function to compare.  Medications: I ordered medication including percocet for pain.  I have reviewed the patients home medicines and have made adjustments as needed.   10:11 PM Care of Shyhiem Beeney Castrellon transferred to Dr. Bari at the end of my shift as the patient will require reassessment once labs/imaging have resulted. Patient presentation, ED  course, and plan of care discussed with review of all pertinent labs and imaging. Please see his/her note for further details regarding further ED course and disposition. Plan at time of handoff is to follow-up with orthopedic physician, trial weightbearing if no indication  for admission, if unable to weight-bear may need placement for PT/OT. This may be altered or completely changed at the discretion of the oncoming team pending results of further workup. Final diagnoses:  None    ED Discharge Orders     None          Rosan Sherlean DEL, PA-C 03/23/24 2212    Bari Roxie HERO, DO 03/24/24 0020

## 2024-03-23 NOTE — ED Triage Notes (Signed)
 Patient BIB EMS from home after tripping over his lawn mower and landing on his right side. Patient c/o right sided hip pain and a laceration to the right arm. Patient is unable to bare weight on the right leg at this time.

## 2024-03-24 ENCOUNTER — Encounter (HOSPITAL_COMMUNITY)
Admission: EM | Disposition: A | Discharge status: Skilled Nursing Facility | Payer: Self-pay | Source: Home / Self Care | Attending: Internal Medicine

## 2024-03-24 ENCOUNTER — Inpatient Hospital Stay (HOSPITAL_COMMUNITY): Admitting: Anesthesiology

## 2024-03-24 ENCOUNTER — Inpatient Hospital Stay (HOSPITAL_COMMUNITY)

## 2024-03-24 ENCOUNTER — Emergency Department (HOSPITAL_COMMUNITY)

## 2024-03-24 ENCOUNTER — Encounter (HOSPITAL_COMMUNITY): Payer: Self-pay | Admitting: Hospitalist

## 2024-03-24 DIAGNOSIS — W010XXA Fall on same level from slipping, tripping and stumbling without subsequent striking against object, initial encounter: Secondary | ICD-10-CM | POA: Diagnosis present

## 2024-03-24 DIAGNOSIS — Z96651 Presence of right artificial knee joint: Secondary | ICD-10-CM | POA: Diagnosis present

## 2024-03-24 DIAGNOSIS — Z8261 Family history of arthritis: Secondary | ICD-10-CM | POA: Diagnosis not present

## 2024-03-24 DIAGNOSIS — N179 Acute kidney failure, unspecified: Secondary | ICD-10-CM | POA: Diagnosis present

## 2024-03-24 DIAGNOSIS — N400 Enlarged prostate without lower urinary tract symptoms: Secondary | ICD-10-CM | POA: Diagnosis present

## 2024-03-24 DIAGNOSIS — Z8249 Family history of ischemic heart disease and other diseases of the circulatory system: Secondary | ICD-10-CM | POA: Diagnosis not present

## 2024-03-24 DIAGNOSIS — I25810 Atherosclerosis of coronary artery bypass graft(s) without angina pectoris: Secondary | ICD-10-CM | POA: Diagnosis present

## 2024-03-24 DIAGNOSIS — I2510 Atherosclerotic heart disease of native coronary artery without angina pectoris: Secondary | ICD-10-CM | POA: Diagnosis present

## 2024-03-24 DIAGNOSIS — I129 Hypertensive chronic kidney disease with stage 1 through stage 4 chronic kidney disease, or unspecified chronic kidney disease: Secondary | ICD-10-CM | POA: Diagnosis present

## 2024-03-24 DIAGNOSIS — Z880 Allergy status to penicillin: Secondary | ICD-10-CM | POA: Diagnosis not present

## 2024-03-24 DIAGNOSIS — Z7902 Long term (current) use of antithrombotics/antiplatelets: Secondary | ICD-10-CM | POA: Diagnosis not present

## 2024-03-24 DIAGNOSIS — Z955 Presence of coronary angioplasty implant and graft: Secondary | ICD-10-CM | POA: Diagnosis not present

## 2024-03-24 DIAGNOSIS — N1831 Chronic kidney disease, stage 3a: Secondary | ICD-10-CM | POA: Diagnosis present

## 2024-03-24 DIAGNOSIS — Z87442 Personal history of urinary calculi: Secondary | ICD-10-CM | POA: Diagnosis not present

## 2024-03-24 DIAGNOSIS — Z23 Encounter for immunization: Secondary | ICD-10-CM | POA: Diagnosis present

## 2024-03-24 DIAGNOSIS — Z882 Allergy status to sulfonamides status: Secondary | ICD-10-CM | POA: Diagnosis not present

## 2024-03-24 DIAGNOSIS — S72001A Fracture of unspecified part of neck of right femur, initial encounter for closed fracture: Secondary | ICD-10-CM | POA: Diagnosis present

## 2024-03-24 DIAGNOSIS — E785 Hyperlipidemia, unspecified: Secondary | ICD-10-CM | POA: Diagnosis present

## 2024-03-24 DIAGNOSIS — J9811 Atelectasis: Secondary | ICD-10-CM | POA: Diagnosis present

## 2024-03-24 DIAGNOSIS — Y92015 Private garage of single-family (private) house as the place of occurrence of the external cause: Secondary | ICD-10-CM | POA: Diagnosis not present

## 2024-03-24 DIAGNOSIS — D696 Thrombocytopenia, unspecified: Secondary | ICD-10-CM | POA: Diagnosis present

## 2024-03-24 DIAGNOSIS — S72144A Nondisplaced intertrochanteric fracture of right femur, initial encounter for closed fracture: Secondary | ICD-10-CM | POA: Diagnosis present

## 2024-03-24 DIAGNOSIS — Z981 Arthrodesis status: Secondary | ICD-10-CM | POA: Diagnosis not present

## 2024-03-24 DIAGNOSIS — S72144D Nondisplaced intertrochanteric fracture of right femur, subsequent encounter for closed fracture with routine healing: Secondary | ICD-10-CM | POA: Diagnosis not present

## 2024-03-24 DIAGNOSIS — Z79899 Other long term (current) drug therapy: Secondary | ICD-10-CM | POA: Diagnosis not present

## 2024-03-24 DIAGNOSIS — Z823 Family history of stroke: Secondary | ICD-10-CM | POA: Diagnosis not present

## 2024-03-24 DIAGNOSIS — Z7901 Long term (current) use of anticoagulants: Secondary | ICD-10-CM | POA: Diagnosis not present

## 2024-03-24 DIAGNOSIS — D62 Acute posthemorrhagic anemia: Secondary | ICD-10-CM | POA: Diagnosis present

## 2024-03-24 HISTORY — PX: INTRAMEDULLARY (IM) NAIL INTERTROCHANTERIC: SHX5875

## 2024-03-24 LAB — URINALYSIS, ROUTINE W REFLEX MICROSCOPIC
Bilirubin Urine: NEGATIVE
Glucose, UA: NEGATIVE mg/dL
Ketones, ur: NEGATIVE mg/dL
Nitrite: NEGATIVE
Protein, ur: 30 mg/dL — AB
Specific Gravity, Urine: 1.015 (ref 1.005–1.030)
pH: 5 (ref 5.0–8.0)

## 2024-03-24 LAB — VITAMIN D 25 HYDROXY (VIT D DEFICIENCY, FRACTURES): Vit D, 25-Hydroxy: 50.18 ng/mL (ref 30–100)

## 2024-03-24 SURGERY — FIXATION, FRACTURE, INTERTROCHANTERIC, WITH INTRAMEDULLARY ROD
Anesthesia: General | Laterality: Right

## 2024-03-24 MED ORDER — ACETAMINOPHEN 325 MG PO TABS
325.0000 mg | ORAL_TABLET | Freq: Four times a day (QID) | ORAL | Status: DC | PRN
  Administered 2024-03-25: 650 mg via ORAL
  Filled 2024-03-24: qty 2

## 2024-03-24 MED ORDER — PHENYLEPHRINE HCL-NACL 20-0.9 MG/250ML-% IV SOLN
INTRAVENOUS | Status: DC | PRN
  Administered 2024-03-24: 20 ug/min via INTRAVENOUS

## 2024-03-24 MED ORDER — LIDOCAINE 2% (20 MG/ML) 5 ML SYRINGE
INTRAMUSCULAR | Status: AC
  Filled 2024-03-24: qty 5

## 2024-03-24 MED ORDER — FENTANYL CITRATE (PF) 100 MCG/2ML IJ SOLN
25.0000 ug | INTRAMUSCULAR | Status: DC | PRN
  Administered 2024-03-24: 25 ug via INTRAVENOUS
  Administered 2024-03-24: 100 ug via INTRAVENOUS

## 2024-03-24 MED ORDER — METOPROLOL TARTRATE 25 MG PO TABS: 50.0000 mg | ORAL_TABLET | Freq: Two times a day (BID) | ORAL | Status: DC

## 2024-03-24 MED ORDER — OXYCODONE HCL 5 MG PO TABS
5.0000 mg | ORAL_TABLET | ORAL | Status: DC | PRN
  Administered 2024-03-25: 10 mg via ORAL
  Administered 2024-03-26 – 2024-03-27 (×2): 5 mg via ORAL
  Filled 2024-03-24: qty 1
  Filled 2024-03-24: qty 2
  Filled 2024-03-24: qty 1

## 2024-03-24 MED ORDER — 0.9 % SODIUM CHLORIDE (POUR BTL) OPTIME
TOPICAL | Status: DC | PRN
  Administered 2024-03-24: 1000 mL

## 2024-03-24 MED ORDER — ALUM & MAG HYDROXIDE-SIMETH 200-200-20 MG/5ML PO SUSP: 30.0000 mL | ORAL | Status: DC | PRN

## 2024-03-24 MED ORDER — TAMSULOSIN HCL 0.4 MG PO CAPS
0.4000 mg | ORAL_CAPSULE | Freq: Every day | ORAL | Status: DC
  Administered 2024-03-24 – 2024-03-28 (×5): 0.4 mg via ORAL
  Filled 2024-03-24 (×5): qty 1

## 2024-03-24 MED ORDER — CHLORHEXIDINE GLUCONATE 0.12 % MT SOLN
15.0000 mL | Freq: Once | OROMUCOSAL | Status: AC
  Administered 2024-03-24: 15 mL via OROMUCOSAL

## 2024-03-24 MED ORDER — HYDROMORPHONE HCL 1 MG/ML IJ SOLN
0.5000 mg | INTRAMUSCULAR | Status: DC | PRN
  Administered 2024-03-24 – 2024-03-27 (×2): 1 mg via INTRAVENOUS
  Filled 2024-03-24 (×2): qty 1

## 2024-03-24 MED ORDER — CLOPIDOGREL BISULFATE 75 MG PO TABS
75.0000 mg | ORAL_TABLET | Freq: Every day | ORAL | Status: DC
  Administered 2024-03-25 – 2024-03-29 (×5): 75 mg via ORAL
  Filled 2024-03-24 (×5): qty 1

## 2024-03-24 MED ORDER — FENTANYL CITRATE (PF) 100 MCG/2ML IJ SOLN
INTRAMUSCULAR | Status: AC
  Filled 2024-03-24: qty 2

## 2024-03-24 MED ORDER — METOCLOPRAMIDE HCL 5 MG/ML IJ SOLN: 5.0000 mg | Freq: Three times a day (TID) | INTRAMUSCULAR | Status: DC | PRN

## 2024-03-24 MED ORDER — DROPERIDOL 2.5 MG/ML IJ SOLN: 0.6250 mg | Freq: Once | INTRAMUSCULAR | Status: DC | PRN

## 2024-03-24 MED ORDER — CHLORHEXIDINE GLUCONATE 0.12 % MT SOLN
OROMUCOSAL | Status: AC
  Filled 2024-03-24: qty 15

## 2024-03-24 MED ORDER — OXYCODONE HCL 5 MG PO TABS
10.0000 mg | ORAL_TABLET | ORAL | Status: DC | PRN
  Administered 2024-03-25: 15 mg via ORAL
  Filled 2024-03-24: qty 3

## 2024-03-24 MED ORDER — SODIUM CHLORIDE 0.9 % IV BOLUS
500.0000 mL | Freq: Once | INTRAVENOUS | Status: AC
  Administered 2024-03-24: 500 mL via INTRAVENOUS

## 2024-03-24 MED ORDER — FENTANYL CITRATE (PF) 250 MCG/5ML IJ SOLN
INTRAMUSCULAR | Status: AC
  Filled 2024-03-24: qty 5

## 2024-03-24 MED ORDER — METOPROLOL TARTRATE 50 MG PO TABS
75.0000 mg | ORAL_TABLET | ORAL | Status: DC
  Administered 2024-03-24 – 2024-03-28 (×5): 75 mg via ORAL
  Filled 2024-03-24 (×5): qty 1

## 2024-03-24 MED ORDER — ROCURONIUM BROMIDE 10 MG/ML (PF) SYRINGE
PREFILLED_SYRINGE | INTRAVENOUS | Status: DC | PRN
  Administered 2024-03-24: 50 mg via INTRAVENOUS

## 2024-03-24 MED ORDER — METHOCARBAMOL 500 MG PO TABS
500.0000 mg | ORAL_TABLET | Freq: Four times a day (QID) | ORAL | Status: DC | PRN
  Administered 2024-03-25 – 2024-03-29 (×11): 500 mg via ORAL
  Filled 2024-03-24 (×11): qty 1

## 2024-03-24 MED ORDER — ENSURE PLUS HIGH PROTEIN PO LIQD
237.0000 mL | Freq: Two times a day (BID) | ORAL | Status: DC
  Administered 2024-03-25 – 2024-03-27 (×3): 237 mL via ORAL

## 2024-03-24 MED ORDER — DOCUSATE SODIUM 100 MG PO CAPS
100.0000 mg | ORAL_CAPSULE | Freq: Two times a day (BID) | ORAL | Status: DC
  Administered 2024-03-24 – 2024-03-29 (×11): 100 mg via ORAL
  Filled 2024-03-24 (×11): qty 1

## 2024-03-24 MED ORDER — ONDANSETRON HCL 4 MG/2ML IJ SOLN: 4.0000 mg | Freq: Four times a day (QID) | INTRAMUSCULAR | Status: DC | PRN

## 2024-03-24 MED ORDER — PROPOFOL 10 MG/ML IV BOLUS
INTRAVENOUS | Status: DC | PRN
  Administered 2024-03-24: 100 mg via INTRAVENOUS
  Administered 2024-03-24: 50 mg via INTRAVENOUS

## 2024-03-24 MED ORDER — PHENOL 1.4 % MT LIQD: 1.0000 | OROMUCOSAL | Status: DC | PRN

## 2024-03-24 MED ORDER — ORAL CARE MOUTH RINSE: 15.0000 mL | Freq: Once | OROMUCOSAL | Status: AC

## 2024-03-24 MED ORDER — INFLUENZA VAC SPLIT HIGH-DOSE 0.5 ML IM SUSY
0.5000 mL | PREFILLED_SYRINGE | INTRAMUSCULAR | Status: AC
  Administered 2024-03-26: 0.5 mL via INTRAMUSCULAR
  Filled 2024-03-24: qty 0.5

## 2024-03-24 MED ORDER — MORPHINE SULFATE (PF) 4 MG/ML IV SOLN
4.0000 mg | INTRAVENOUS | Status: DC | PRN
  Administered 2024-03-24 – 2024-03-26 (×9): 4 mg via INTRAVENOUS
  Filled 2024-03-24 (×9): qty 1

## 2024-03-24 MED ORDER — PROPOFOL 500 MG/50ML IV EMUL
INTRAVENOUS | Status: DC | PRN
  Administered 2024-03-24: 100 ug/kg/min via INTRAVENOUS

## 2024-03-24 MED ORDER — MENTHOL 3 MG MT LOZG: 1.0000 | LOZENGE | OROMUCOSAL | Status: DC | PRN

## 2024-03-24 MED ORDER — TRANEXAMIC ACID-NACL 1000-0.7 MG/100ML-% IV SOLN
1000.0000 mg | Freq: Once | INTRAVENOUS | Status: AC
  Administered 2024-03-24: 1000 mg via INTRAVENOUS
  Filled 2024-03-24: qty 100

## 2024-03-24 MED ORDER — ROCURONIUM BROMIDE 10 MG/ML (PF) SYRINGE
PREFILLED_SYRINGE | INTRAVENOUS | Status: AC
  Filled 2024-03-24: qty 10

## 2024-03-24 MED ORDER — LIDOCAINE 2% (20 MG/ML) 5 ML SYRINGE
INTRAMUSCULAR | Status: DC | PRN
  Administered 2024-03-24: 60 mg via INTRAVENOUS

## 2024-03-24 MED ORDER — METOPROLOL TARTRATE 25 MG PO TABS
25.0000 mg | ORAL_TABLET | Freq: Every morning | ORAL | Status: DC
  Administered 2024-03-24 – 2024-03-29 (×6): 25 mg via ORAL
  Filled 2024-03-24 (×6): qty 1

## 2024-03-24 MED ORDER — METOCLOPRAMIDE HCL 5 MG PO TABS: 5.0000 mg | ORAL_TABLET | Freq: Three times a day (TID) | ORAL | Status: DC | PRN

## 2024-03-24 MED ORDER — DEXAMETHASONE SOD PHOSPHATE PF 10 MG/ML IJ SOLN
INTRAMUSCULAR | Status: DC | PRN
  Administered 2024-03-24: 5 mg via INTRAVENOUS

## 2024-03-24 MED ORDER — ONDANSETRON HCL 4 MG PO TABS: 4.0000 mg | ORAL_TABLET | Freq: Four times a day (QID) | ORAL | Status: DC | PRN

## 2024-03-24 MED ORDER — CEFAZOLIN SODIUM-DEXTROSE 2-4 GM/100ML-% IV SOLN
2.0000 g | INTRAVENOUS | Status: AC
  Administered 2024-03-24: 2 g via INTRAVENOUS

## 2024-03-24 MED ORDER — PANTOPRAZOLE SODIUM 40 MG PO TBEC
40.0000 mg | DELAYED_RELEASE_TABLET | Freq: Every day | ORAL | Status: DC
  Administered 2024-03-24 – 2024-03-29 (×6): 40 mg via ORAL
  Filled 2024-03-24 (×6): qty 1

## 2024-03-24 MED ORDER — METHOCARBAMOL 1000 MG/10ML IJ SOLN: 500.0000 mg | Freq: Four times a day (QID) | INTRAMUSCULAR | Status: DC | PRN

## 2024-03-24 MED ORDER — CEFAZOLIN SODIUM-DEXTROSE 2-4 GM/100ML-% IV SOLN
2.0000 g | Freq: Four times a day (QID) | INTRAVENOUS | Status: AC
  Administered 2024-03-24 – 2024-03-25 (×2): 2 g via INTRAVENOUS
  Filled 2024-03-24 (×2): qty 100

## 2024-03-24 MED ORDER — SODIUM CHLORIDE 0.9 % IV SOLN: INTRAVENOUS | Status: DC

## 2024-03-24 MED ORDER — PHENYLEPHRINE 80 MCG/ML (10ML) SYRINGE FOR IV PUSH (FOR BLOOD PRESSURE SUPPORT)
PREFILLED_SYRINGE | INTRAVENOUS | Status: DC | PRN
  Administered 2024-03-24 (×2): 160 ug via INTRAVENOUS

## 2024-03-24 MED ADMIN — Acetaminophen Tab 325 MG: 650 mg | ORAL | NDC 00904677361

## 2024-03-24 MED ADMIN — Ondansetron HCl Inj 4 MG/2ML (2 MG/ML): 4 mg | INTRAVENOUS | NDC 60505613005

## 2024-03-24 MED ADMIN — Sugammadex Sodium IV 200 MG/2ML (Base Equivalent): 200 mg | INTRAVENOUS | NDC 99999070036

## 2024-03-24 MED FILL — Ondansetron HCl Inj 4 MG/2ML (2 MG/ML): INTRAMUSCULAR | Qty: 2 | Status: AC

## 2024-03-24 MED FILL — Propofol IV Emul 200 MG/20ML (10 MG/ML): INTRAVENOUS | Qty: 20 | Status: AC

## 2024-03-24 MED FILL — Acetaminophen Tab 325 MG: 650.0000 mg | ORAL | Qty: 2 | Status: AC

## 2024-03-24 MED FILL — Cefazolin Sodium-Dextrose IV Solution 2 GM/100ML-4%: INTRAVENOUS | Qty: 100 | Status: AC

## 2024-03-24 SURGICAL SUPPLY — 34 items
BAG COUNTER SPONGE SURGICOUNT (BAG) ×1 IMPLANT
BIT DRILL INTERTAN LAG SCREW (BIT) IMPLANT
BLADE SURG 15 STRL LF DISP TIS (BLADE) ×1 IMPLANT
BNDG GAUZE DERMACEA FLUFF 4 (GAUZE/BANDAGES/DRESSINGS) ×1 IMPLANT
COVER PERINEAL POST (MISCELLANEOUS) ×1 IMPLANT
COVER SURGICAL LIGHT HANDLE (MISCELLANEOUS) ×1 IMPLANT
DRAPE STERI IOBAN 125X83 (DRAPES) ×1 IMPLANT
DRSG AQUACEL AG ADV 3.5X10 (GAUZE/BANDAGES/DRESSINGS) IMPLANT
DURAPREP 26ML APPLICATOR (WOUND CARE) ×1 IMPLANT
ELECTRODE REM PT RTRN 9FT ADLT (ELECTROSURGICAL) ×1 IMPLANT
FACESHIELD WRAPAROUND OR TEAM (MASK) ×1 IMPLANT
GLOVE BIO SURGEON STRL SZ8 (GLOVE) ×1 IMPLANT
GLOVE BIOGEL PI IND STRL 8 (GLOVE) ×1 IMPLANT
GLOVE ORTHO TXT STRL SZ7.5 (GLOVE) ×1 IMPLANT
GOWN STRL REUS W/ TWL LRG LVL3 (GOWN DISPOSABLE) ×1 IMPLANT
GOWN STRL REUS W/ TWL XL LVL3 (GOWN DISPOSABLE) ×2 IMPLANT
KIT BASIN OR (CUSTOM PROCEDURE TRAY) ×1 IMPLANT
KIT TURNOVER KIT B (KITS) ×1 IMPLANT
MANIFOLD NEPTUNE II (INSTRUMENTS) ×1 IMPLANT
NAIL LOCK CANN 10X420 125D RT (Miscellaneous) IMPLANT
PACK GENERAL/GYN (CUSTOM PROCEDURE TRAY) ×1 IMPLANT
PAD ARMBOARD POSITIONER FOAM (MISCELLANEOUS) ×2 IMPLANT
PAD CAST 4YDX4 CTTN HI CHSV (CAST SUPPLIES) ×2 IMPLANT
PIN GUIDE 3.2X343MM (PIN) IMPLANT
SCREW LAG COMPR KIT 100/95 (Screw) IMPLANT
SOLN 0.9% NACL 1000 ML (IV SOLUTION) ×1 IMPLANT
SOLN 0.9% NACL POUR BTL 1000ML (IV SOLUTION) ×1 IMPLANT
SOLN STERILE WATER 1000 ML (IV SOLUTION) ×1 IMPLANT
SOLN STERILE WATER BTL 1000 ML (IV SOLUTION) ×1 IMPLANT
STAPLER SKIN PROX 35W (STAPLE) ×1 IMPLANT
SUT VIC AB 0 CT1 27XBRD ANBCTR (SUTURE) ×2 IMPLANT
SUT VIC AB 2-0 CT1 TAPERPNT 27 (SUTURE) ×2 IMPLANT
TOWEL GREEN STERILE (TOWEL DISPOSABLE) ×1 IMPLANT
TOWEL GREEN STERILE FF (TOWEL DISPOSABLE) ×1 IMPLANT

## 2024-03-24 NOTE — Transfer of Care (Signed)
 Immediate Anesthesia Transfer of Care Note  Patient: Daniel [name]  Procedure(s) Performed: FIXATION, FRACTURE, INTERTROCHANTERIC, WITH INTRAMEDULLARY ROD (Right)  Patient Location: PACU  Anesthesia Type:General  Level of Consciousness: awake, alert , and oriented  Airway & Oxygen Therapy: Patient Spontanous Breathing  Post-op Assessment: Report given to RN and Post -op Vital signs reviewed and stable  Post vital signs: Reviewed and stable  Last Vitals:  Vitals Value Taken Time  BP 114/69 03/24/24 13:40  Temp    Pulse 74 03/24/24 13:43  Resp 18 03/24/24 13:43  SpO2 93 % 03/24/24 13:43  Vitals shown include unfiled device data.  Last Pain:  Vitals:   03/24/24 1219  TempSrc: Oral  PainSc: 0-No pain      Patients Stated Pain Goal: 0 (03/24/24 0657)  Complications: No notable events documented.

## 2024-03-24 NOTE — Progress Notes (Signed)
 Patient ID: Daniel [name], male   DOB: March 01, 1933, 89 y.o.   MRN: [id number] I just spoke to the patient in length in detail about his right hip fracture and the recommendation for surgery to address the fracture.  He is actually someone who I operated on his wife many years ago.  Unfortunately she passed away several years ago.  He does live alone.  This was an accidental mechanical fall after he was checking on his lawn more wheels once he had mowed the grass.  The x-rays and mainly MRI confirmed a nondisplaced intertrochanteric fracture of the right hip.  The risks and benefits of surgery were discussed in detail.  Informed consent has been obtained and the right operative hip has been marked.

## 2024-03-24 NOTE — Anesthesia Postprocedure Evaluation (Signed)
 Anesthesia Post Note  Patient: Nam Vossler Lewman  Procedure(s) Performed: FIXATION, FRACTURE, INTERTROCHANTERIC, WITH INTRAMEDULLARY ROD (Right)     Patient location during evaluation: PACU Anesthesia Type: General Level of consciousness: awake and alert Pain management: pain level controlled Vital Signs Assessment: post-procedure vital signs reviewed and stable Respiratory status: spontaneous breathing, nonlabored ventilation, respiratory function stable and patient connected to nasal cannula oxygen Cardiovascular status: blood pressure returned to baseline and stable Postop Assessment: no apparent nausea or vomiting Anesthetic complications: no   No notable events documented.  Last Vitals:  Vitals:   03/24/24 1451 03/24/24 2010  BP: (!) 164/64 (!) 144/67  Pulse: 69 95  Resp: 18 18  Temp: 37.1 C 37.1 C  SpO2: 96% 97%    Last Pain:  Vitals:   03/24/24 2010  TempSrc: Oral  PainSc: 4                  Ayako Tapanes P Makoa Satz

## 2024-03-24 NOTE — ED Notes (Signed)
 Paged provider, Georgina, MD regarding patient temperature.

## 2024-03-24 NOTE — ED Notes (Signed)
 Handoff given to inpatient nurse.

## 2024-03-24 NOTE — Anesthesia Preprocedure Evaluation (Addendum)
 Anesthesia Evaluation  Patient identified by MRN, date of birth, ID band Patient awake    Reviewed: Allergy & Precautions, NPO status , Patient's Chart, lab work & pertinent test results  History of Anesthesia Complications (+) DIFFICULT AIRWAY and history of anesthetic complications  Airway Mallampati: III  TM Distance: <3 FB Neck ROM: Limited    Dental no notable dental hx.    Pulmonary neg pulmonary ROS   Pulmonary exam normal        Cardiovascular hypertension, Pt. on medications and Pt. on home beta blockers + CAD, + Cardiac Stents, + CABG (1997) and + DOE  + dysrhythmias  Rhythm:Regular Rate:Normal     Neuro/Psych negative neurological ROS  negative psych ROS   GI/Hepatic negative GI ROS, Neg liver ROS,,,  Endo/Other  negative endocrine ROS    Renal/GU negative Renal ROS  negative genitourinary   Musculoskeletal  (+) Arthritis , Osteoarthritis,  Hip fracture    Abdominal Normal abdominal exam  (+)   Peds  Hematology Lab Results      Component                Value               Date                      WBC                      13.8 (H)            03/23/2024                HGB                      12.3 (L)            03/23/2024                HCT                      37.0 (L)            03/23/2024                MCV                      93.0                03/23/2024                PLT                      214                 03/23/2024             Lab Results      Component                Value               Date                      NA                       140                 03/23/2024  K                        4.2                 03/23/2024                CO2                      25                  03/23/2024                GLUCOSE                  119 (H)             03/23/2024                BUN                      27 (H)              03/23/2024                CREATININE                1.42 (H)            03/23/2024                CALCIUM                   9.3                 03/23/2024                EGFR                     65                  06/09/2022                GFRNONAA                 47 (L)              03/23/2024              Anesthesia Other Findings GS with neck surgery in the past  Reproductive/Obstetrics                              Anesthesia Physical Anesthesia Plan  ASA: 3  Anesthesia Plan: General   Post-op Pain Management: Tylenol  PO (pre-op)*   Induction: Intravenous  PONV Risk Score and Plan: 2 and Ondansetron , Dexamethasone  and Treatment may vary due to age or medical condition  Airway Management Planned: Mask, Oral ETT and Video Laryngoscope Planned  Additional Equipment: None  Intra-op Plan:   Post-operative Plan: Extubation in OR  Informed Consent: I have reviewed the patients History and Physical, chart, labs and discussed the procedure including the risks, benefits and alternatives for the proposed anesthesia with the patient or authorized representative who has indicated his/her understanding and acceptance.     Dental advisory given  Plan Discussed with: CRNA  Anesthesia Plan Comments:          Anesthesia Quick Evaluation

## 2024-03-24 NOTE — Op Note (Addendum)
 Operative Note  Date of operation: 03/24/2024 Preoperative diagnosis: Right hip nondisplaced intertrochanteric femur fracture Postoperative diagnosis: Same  Procedure: Open reduction/internal fixation of right intertrochanteric hip fracture  Implants: Implant Name Type Inv. Item Serial No. Manufacturer Lot No. LRB No. Used Action  NAIL LOCK CANN [id number] 125D RT - [id number] Miscellaneous NAIL LOCK CANN 10X420 125D RT  [employer] [id number] Right 1 Implanted  SCREW LAG COMPR KIT 100/95 - [id number] Screw SCREW LAG COMPR KIT 100/95  [facility] [employer] [id number] Right 1 Implanted   Surgeon: Lonni GRADE. Vernetta, MD Assistant: Tory Gaskins, PA-C  Anesthesia: General Antibiotics: IV Ancef  EBL: 100 cc Complications: None  Indications: Daniel [name] is an 89 year old active gentleman who lives alone.  He was mowing his grass yesterday evening and then once he was done he was checking the wheels on the lawn more and then he said his left knee kind of buckled so he landed hard on his right hip.  He had significant right hip pain and difficulty with ambulating.  He was brought to the [facility] emergency room and found to have a nondisplaced right hip intertrochanteric fracture.  He was graciously admitted to the medicine service and presents today for definitive treatment of this fracture.  The fracture itself is been described the patient in detail as well as a description of the surgery.  We described what to expect from an intraoperative and postoperative standpoint and discussed the risk and benefits of the surgery.  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  Procedure description: After informed consent was obtained and the appropriate right hip was marked, the patient was brought to the operating room and general anesthesia was obtained while he is on the stretcher.  Next he was placed supine on the Hana fracture table  with a perineal post and placed in the right operative leg in inline skeletal traction but no traction needed to be applied since the fracture was nondisplaced.  The left nonoperative leg was placed in a well leg holder.  We then assessed his right operative hip radiographically.  We chose our intramedullary nail keeping it sterilely in the box while we assess the nail under C-arm guidance.  We then passed a 10 x 400 right InterTAN nail off to the back table for opening up sterilely.  The right operative hip was then prepped and draped with DuraPrep and sterile drapes.  Timeout was called and he identifies great patient correct right hip.  An incision was then made just proximal to the greater trochanter on the lateral aspect of his hip.  We dissect down the tip greater trochanter and the temporary guidepin was put under fluoroscopic guidance in antegrade fashion from the tip of the greater trochanter down to the lesser trochanter.  We then used issuing reamer to open up the femoral canal and remove the guidepin.  We then placed our 10 x 400 right InterTAN [employer] femoral nail down the canal without needing to ream.  We then used the outrigger guide made a separate lateral incision and placed a temporary guidepin for positioning of our interdigitating lag screw compression screw.  We chose a 100 mm lag screw with a 95 mm compression screw and drilled to those depths and placed those 2 screws without difficulty again verifying placement under fluoroscopy.  We then removed the operator guide and put the hip through internal extra rotation and verifying the placement of  the instrumentation in the hip.  The wounds were then irrigated with normal saline solution.  The deep tissue was closed with 0 Vicryl followed by 2-0 Vicryl subcutaneous tissue interrupted staples to close the skin.  Xeroform well-padded sterile dressing is applied.  The patient was taken off of the fracture table, awakened, extubated and taken  recovery in stable addition.  Postoperatively we will allow for weightbearing as tolerated.  Tory Gaskins, PA-C, assisted during entire case and beginning to end and his assistance was crucial and medically necessary for soft tissue management and retraction, helping guide implant placement and a layered closure of the wound.

## 2024-03-24 NOTE — ED Notes (Signed)
 Report was given to OR team.

## 2024-03-24 NOTE — ED Notes (Signed)
 Patient transported to MRI

## 2024-03-24 NOTE — Anesthesia Procedure Notes (Signed)
 Procedure Name: Intubation Date/Time: 03/24/2024 12:43 PM  Performed by: Emmitt Millman, CRNAPre-anesthesia Checklist: Patient identified, Emergency Drugs available, Suction available and Patient being monitored Patient Re-evaluated:Patient Re-evaluated prior to induction Oxygen Delivery Method: Circle system utilized Preoxygenation: Pre-oxygenation with 100% oxygen Induction Type: IV induction Ventilation: Mask ventilation without difficulty Laryngoscope Size: Glidescope and 4 Grade View: Grade I Tube type: Oral Tube size: 7.0 mm Number of attempts: 1 Airway Equipment and Method: Stylet Placement Confirmation: ETT inserted through vocal cords under direct vision, positive ETCO2 and breath sounds checked- equal and bilateral Secured at: 23 cm Tube secured with: Tape Dental Injury: Teeth and Oropharynx as per pre-operative assessment  Difficulty Due To: Difficult Airway- due to reduced neck mobility and Difficult Airway- due to limited oral opening Comments: Elective glidescope intubation 2/2 previous difficulty. Grade 1 view with glide 4

## 2024-03-24 NOTE — H&P (Signed)
 History and Physical    Patient: Daniel [name] DOB: [birth year] DOA: 03/23/2024 DOS: the patient was seen and examined on 03/24/2024 PCP: Shepard Ade, MD  Patient coming from: Home  Chief Complaint:  Chief Complaint  Patient presents with   Fall   HPI: Daniel [name] is a 89 y.o. male with medical history significant of CAD (s/p CABG in 1997; PCI to RCA in 11/2006; PCI to OM in 04/2022), HTN, and HLD p/w GLF c/b R hip fracture and AKI.  The patient reported falling in the garage after completing yard work. The fall occurred around 5:25 to 5:30 PM after the patient attempted to grease the front wheels of a lawnmower. The patient was unsure of the exact mechanism but mentioned that the ankle may have turned, causing the fall between a lawnmower and a lawn sweeper. The patient sustained an injury to the arm, with skin being peeled back from hitting the sweeper. More concerning, the patient experienced immediate pain in the right hip and was unable to put any weight on the right leg. The patient was unable to get up independently and required assistance from a neighbor, who then called the fire department for further help. The fire department was able to assist the patient into a chair, and the patient could stand only on the left leg while being unable to bear weight on the right leg; as such, the pt was BIBA to ED for further evaluation.  In the ED, pt hypertensive and tachycardic. Labs notable for Cr 1.42 (baseline Cr 1.1), and WBC 13.8 CT chows greater trochanter fracture. EDP consulted orthopedic surgery and requested medicine admission.   Review of Systems: As mentioned in the history of present illness. All other systems reviewed and are negative. Past Medical History:  Diagnosis Date   Arthritis    CAD of autologous arterial graft 2010   a) ~2010 & 2017 - LIMA-LAD was atretic (LAD filling via SVG-D2); b)  04/2022: LIMA-LAD now fully patent, 85% mSVG-OM2 =? DES PCI    CAD S/P percutaneous coronary angioplasty 11/2006   a. 6/'08: PCI nongrafted distal RCA-PDA: Mini vision BMS 2.25 mm x 28 mm; b. Relook Cath 08/2015: stable,: atretic LIMA;; 04/2022: 85% SVG-OM2  => DES PCI Synergy XD 4.0 mmx16 mm); LM 60%, pLAD 70%, p-m LAD 100% & p-m LCX 100%; 40% dRCA with patent rPDA BMS; SVG-Diag & LIMA-LAD patent   Complication of anesthesia    Coronary artery disease involving left main coronary artery [id number]   Last cath 2010: 70-80 % ostial left main, 80-90% LAD, subtotal CTO LCx-OM2, RCA patent w/ patent BMS stent (dRCA-PDA); SVG-diagonal backfills LAD, atretic LIMA-LAD -- medical therapy; stable by relook cath in March 2017   Difficult intubation    VERY NARROW AIRWAY PER ANESTHESIOLOGIST (1997 CABG AT [facility])   Dyslipidemia, goal LDL below 70    Dysrhythmia    Heart palpitations    Never fully diagnosed.   History of kidney stones    Hypertension    Insomnia    Mass in chest    JUST WATCHING , WILL CHECK OUT AFTER SURGERY   Pneumonia    S/P CABG x 3 1997   LIMA-LAD, SVG-D2, SVG-OM --> LIMA known to be atretic, but SVG-D2 fills diagonal and LAD   Stenosis of cervical spine with myelopathy (HCC)    Tingling    in fingers   Past Surgical History:  Procedure Laterality Date   48-HOUR MONITOR  03/2017   Relatively normal.  Very infrequent ectopy.  Short PAT run with occasional PACs/PVCs and bigeminy.  No symptoms noted on monitor.  Longest PAT runs were less than 10 beats.   BACK SURGERY     decompression  lower back    CARDIAC CATHETERIZATION N/A 08/08/2015   Procedure: Left Heart Cath and Cors/Grafts Angiography;  Surgeon: Alm LELON Clay, MD;  Location: [facility] INVASIVE CV LAB;; -->  LM 80%-70%. Ost LAD 80% then prox-mid 100% (after small D1). Small LCx & OM2 patent with 100% OM2. Prox & distal RCA 20%, patent BMS in distal RCA-rPDA.  Patent/atretic LIMA-dLAD, SVG-D2 (backdills entire dLAD) & SVG-OM2.    COLONOSCOPY     CORONARY ANGIOPLASTY WITH STENT PLACEMENT   11/2006   BMS PCI of distal RCA-RPDA: PCI with a 2.25 mm x 28 mm Mini Vision bare-metal stent.   CORONARY ARTERY BYPASS GRAFT  [id number]   LIMA - LAD, SVG- diagonal, SVG-OM   CORONARY STENT INTERVENTION N/A 04/26/2022   Procedure: CORONARY STENT INTERVENTION;  Surgeon: Swazil[place], Peter M, MD;  Location: [facility] INVASIVE CV LAB;  Service: CV: 95% mid SVG-OM2 => DES PCI (Synergy XD DES 4.0 mm x 16 mm) -> complicated by transient no reflow resolved with IC verapamil    EYE SURGERY     BIL CATARACT   KIDNEY STONE SURGERY     KNEE ARTHROSCOPY     RT  KNEE   LEFT HEART CATH AND CORS/GRAFTS ANGIOGRAPHY  11/2006   High-grade distal RCA and PDA stenosis --> BMS PCI.    LEFT HEART CATH AND CORS/GRAFTS ANGIOGRAPHY  2010   Native coronaries at 70% to 80% ostial left main. LAD had diffuse 80% to 90% stenosis   LEFT HEART CATH AND CORS/GRAFTS ANGIOGRAPHY N/A 04/26/2022   Procedure: LEFT HEART CATH AND CORS/GRAFTS ANGIOGRAPHY;  Surgeon: Swaziland, Peter M, MD;  Location: [facility] INVASIVE CV LAB;  Service: CV::   2 V Occlusive Native CAD: 60% LM, 70% ostial-prox LAD, 100% mid LAD, 1 100% prox-mid LCx; 40% distRCA with patent PDA stent.  Patent LIMA-LAD, SVG-D2. 95% mid SVG-OM2 => DES PCI   NM MYOVIEW  LTD  03/17/2017   No significant reversible ischemia. Increased TID ratio 1.34. LVEF 54% with normal wall motion. This is a low risk study.   POSTERIOR CERVICAL FUSION/FORAMINOTOMY N/A 07/14/2017   Procedure: LAMINECTOMY CERVICAL THREE- CERVICAL FOUR WITH LATERAL MASS FUSION;  Surgeon: Lanis Pupa, MD;  Location: [facility] OR;  Service: Neurosurgery;  Laterality: N/A;  LAMINECTOMY CERVICAL 3- CERVICAL 4 WITH LATERAL MASS FUSION   TOTAL KNEE ARTHROPLASTY Right 04/07/2020   Procedure: TOTAL KNEE ARTHROPLASTY;  Surgeon: Rubie Kemps, MD;  Location: [facility] ORS;  Service: Orthopedics;  Laterality: Right;   TRANSTHORACIC ECHOCARDIOGRAM  04/26/2022   EF 60 to 65%, normal LV function, no RWMA, mild concentric LVH, G1 DD, normal RV systolic  function, no significant valvular abnormalities.   Social History:  reports that he has never smoked. He has never used smokeless tobacco. He reports that he does not drink alcohol and does not use drugs.  Allergies  Allergen Reactions   Penicillins Swelling and Other (See Comments)    Swelling and redness in joints  Has patient had a PCN reaction causing immediate rash, facial/tongue/throat swelling, SOB or lightheadedness with hypotension: No Has patient had a PCN reaction causing severe rash involving mucus membranes or skin necrosis: No Has patient had a PCN reaction that required hospitalization: No Has patient had a PCN reaction occurring within the last 10 years: No If all of  the above answers are NO, then may proceed with Cephalosporin use.    Statins Other (See Comments)    Myalgia and leg cramps INTOLERANCE AT HIGH DOSES   Sulfa Antibiotics Other (See Comments)    Pain in left side underneath ribcage-pancreas   Duloxetine Hcl Other (See Comments)    Dizziness    Family History  Problem Relation Age of Onset   Stroke Mother 73   Heart disease Mother    Arthritis Mother    Heart attack Father 6   Other Sister    Heart disease Brother    Allergic rhinitis Neg Hx    Atopy Neg Hx    Angioedema Neg Hx    Asthma Neg Hx    Eczema Neg Hx    Immunodeficiency Neg Hx    Urticaria Neg Hx     Prior to Admission medications   Medication Sig Start Date End Date Taking? Authorizing Provider  acetaminophen  (TYLENOL ) 500 MG tablet Take 1,000 mg by mouth every 8 (eight) hours as needed for mild pain.   Yes [provider]  azelastine  (ASTELIN ) 0.1 % nasal spray Place 1-2 sprays into both nostrils 2 (two) times daily. Patient taking differently: Place 1-2 sprays into both nostrils daily as needed for allergies. 09/09/20  Yes Kozlow, Camellia PARAS, MD  cholecalciferol  (VITAMIN D ) 1000 units tablet Take 1,000 Units by mouth daily.   Yes [provider]  clindamycin   (CLEOCIN ) 300 MG capsule Take 300 mg by mouth 2 (two) times daily. 03/04/21  Yes [provider]  clopidogrel  (PLAVIX ) 75 MG tablet TAKE 1 TABLET(75 MG) BY MOUTH DAILY 04/04/23  Yes Anner Alm ORN, MD  loratadine (CLARITIN) 10 MG tablet Take 10 mg by mouth daily as needed for allergies or rhinitis.   Yes [provider]  metoprolol  tartrate (LOPRESSOR ) 50 MG tablet Take 25 mg ( 1/2 tablet of 50 mg) in the morning  and 75 mg  ( 1 and 1/2 tablets) at 8 pm in the evening 02/21/24  Yes Anner Alm ORN, MD  montelukast  (SINGULAIR ) 10 MG tablet TAKE 1 TABLET(10 MG) BY MOUTH AT BEDTIME 08/10/21  Yes Kozlow, Eric J, MD  Multiple Vitamin (MULTIVITAMIN WITH MINERALS) TABS tablet Take 1 tablet by mouth daily.   Yes [provider]  nitroGLYCERIN  (NITROSTAT ) 0.4 MG SL tablet PLACE 1 TABLET UNDER THE TONGUE EVERY 5 MINUTES AS NEEDED FOR CHEST PAIN FOR 3 DOSES. IF NO RELIEF AFTER FIRST DOSE CALL 911 OR MD 04/06/23  Yes Anner Alm ORN, MD  tamsulosin  (FLOMAX ) 0.4 MG CAPS capsule Take 0.4 mg by mouth at bedtime.  06/08/14  Yes [provider]  traMADol  (ULTRAM ) 50 MG tablet Take 100 mg by mouth 2 (two) times daily. 03/16/21  Yes [provider]  triamcinolone  (NASACORT ) 55 MCG/ACT AERO nasal inhaler Place 1 spray into the nose daily. Patient taking differently: Place 1 spray into the nose at bedtime. 09/09/20  Yes Kozlow, Eric J, MD  vitamin B-12 (CYANOCOBALAMIN ) 1000 MCG tablet Take 1,000 mcg by mouth 2 (two) times daily.    Yes [provider]  zolpidem  (AMBIEN ) 10 MG tablet Take 10 mg by mouth at bedtime. 03/24/20  Yes [provider]    Physical Exam: Vitals:   03/24/24 0200 03/24/24 0204 03/24/24 0630 03/24/24 0657  BP: (!) 166/78  (!) 178/68 (!) 183/77  Pulse: (!) 106  96 90  Resp: 11  17 (!) 21  Temp:  99.8 F (37.7 C)  99.5 F (  37.5 C)  TempSrc:  Oral  Oral  SpO2: 97%  98% 95%   General: Alert, oriented x3, resting comfortably in no acute  distress HEENT: EOMI, oropharynx clear, moist mucous membranes, hearing intact Neck: Trachea midline and no gross thyromegaly Respiratory: Lungs clear to auscultation bilaterally with normal respiratory effort; no w/r/r Cardiovascular: Regular rate and rhythm w/o m/r/g Abdomen: Soft, nontender, nondistended. Positive bowel sounds MSK: No obvious joint deformities or swelling Skin: No obvious rashes or lesions Neurologic: Awake, alert, spontaneously moves all extremities, strength intact Psychiatric: Appropriate mood and affect, conversational and cooperative   Data Reviewed:  Lab Results  Component Value Date   WBC 13.8 (H) 03/23/2024   HGB 12.3 (L) 03/23/2024   HCT 37.0 (L) 03/23/2024   MCV 93.0 03/23/2024   PLT 214 03/23/2024   Lab Results  Component Value Date   GLUCOSE 119 (H) 03/23/2024   CALCIUM  9.3 03/23/2024   NA 140 03/23/2024   K 4.2 03/23/2024   CO2 25 03/23/2024   CL 104 03/23/2024   BUN 27 (H) 03/23/2024   CREATININE 1.42 (H) 03/23/2024   Lab Results  Component Value Date   ALT 12 06/09/2022   AST 15 06/09/2022   ALKPHOS 94 06/09/2022   BILITOT 0.8 06/09/2022   Lab Results  Component Value Date   INR 1.0 04/25/2022   INR 1.1 02/03/2021   INR 1.08 08/08/2015   Radiology: CT Hip Right Wo Contrast Result Date: 03/23/2024 EXAM: CT OF THE RIGHT HIP WITHOUT IV CONTRAST 03/23/2024 08:10:23 PM TECHNIQUE: CT of the right hip was performed without the administration of intravenous contrast. Multiplanar reformatted images are provided for review. Automated exposure control, iterative reconstruction, and/or weight based adjustment of the mA/kV was utilized to reduce the radiation dose to as low as reasonably achievable. COMPARISON: Same day hip radiograph. CLINICAL HISTORY: Hip trauma, fracture suspected, xray done. Fall with right hip pain. FINDINGS: BONES: Nondisplaced comminuted fracture of the right greater trochanter. No extension into the femoral diaphysis or  femoral neck. SOFT TISSUE: No significant soft tissue edema or fluid collections. No soft tissue mass. JOINT: No osseous erosions. INTRAPELVIC CONTENTS: Limited images of the intrapelvic contents are unremarkable. IMPRESSION: 1. Nondisplaced comminuted fracture of the right greater trochanter. Electronically signed by: Norman Gatlin MD 03/23/2024 08:50 PM EDT RP Workstation: [id number]   DG Knee Complete 4 Views Right Result Date: 03/23/2024 CLINICAL DATA:  fall, right hip pain, unable to bear weight on the right leg EXAM: RIGHT KNEE - COMPLETE 4+ VIEW COMPARISON:  None Available. FINDINGS: Well-aligned knee arthroplasty without acute fracture.No periprosthetic lucency to suggest loosening.No dislocation. Multiple surgical clips in the medial aspect of the calf. No radiopaque foreign body or retained surgical instrument. IMPRESSION: Well-aligned knee arthroplasty without acute fracture or dislocation.No periprosthetic lucency to suggest hardware loosening. Electronically Signed   By: Rogelia Myers M.D.   On: 03/23/2024 19:43   DG Hip Unilat W or Wo Pelvis 2-3 Views Right Result Date: 03/23/2024 CLINICAL DATA:  fall, right hip pain EXAM: DG HIP (WITH OR WITHOUT PELVIS) 2-3V RIGHT COMPARISON:  None Available. FINDINGS: No evidence of pelvic fracture or diastasis.No acute hip fracture or dislocation.Degenerative disc disease of the lumbar spine. Peripheral vascular atherosclerosis. IMPRESSION: No acute fracture, pelvic bone diastasis, or dislocation. Electronically Signed   By: Rogelia Myers M.D.   On: 03/23/2024 19:41   DG Ankle Complete Right Result Date: 03/23/2024 CLINICAL DATA:  fall EXAM: RIGHT ANKLE - COMPLETE 3+ VIEW COMPARISON:  None Available. FINDINGS:  No acute fracture or dislocation. No ankle mortise widening. The talar dome is intact. Advanced midfoot degenerative changes. Surgical clips along the medial calf. IMPRESSION: No acute fracture or dislocation. Electronically Signed   By:  Rogelia Myers M.D.   On: 03/23/2024 19:38    Assessment and Plan: 71M h/o CAD (s/p CABG in 1997; PCI to RCA in 11/2006; PCI to OM in 04/2022), HTN, and HLD p/w GLF c/b R hip fracture and AKI.  R hip fracture Fragility fracture -PT/OT consulted; apprec eval/recs -Ortho consulted; apprec eval/recs -IV morphine  prn for pain control -F/u 25-OH vitamin D  and treat if low w/ ergochalciferol 50,000 IU weekly for 8-12 weeks; pt will need 25-OH vitamin D  re-testing as outpt to ensure repletion in 8-12 weeks; may consider ergochalciferol 2000 IU daily after testing in 8-12 weeks for maintenance therapy -Consider bisphosphonate or Prolia at d/c and OP PCP f/u for DEXA scan -NPO  AKI Admission Cr 1.42 (baseline 1.1) -MIVF: NS at 50cc/h for 24h -Strict I&Os and daily weights (standing preferred) -F/u BMP daily -Renally dose medications for CrCl -Avoid lovenox , NSAIDs, morphine , Fleet's phosphate enema, regular insulin, contrast; no gadolinium for MRI to avoid nephrogenic systemic fibrosis -Consider renal US  and nephrology consult if worsening AKI  CAD -PTA Plavix  75mg  daily on 10/19  HTN -PTA metoprolol   BPH -PTA Flomax     Advance Care Planning:   Code Status: Full Code   Consults: Orthopedic surgery  Family Communication: N/A  Severity of Illness: The appropriate patient status for this patient is INPATIENT. Inpatient status is judged to be reasonable and necessary in order to provide the required intensity of service to ensure the patient's safety. The patient's presenting symptoms, physical exam findings, and initial radiographic and laboratory data in the context of their chronic comorbidities is felt to place them at high risk for further clinical deterioration. Furthermore, it is not anticipated that the patient will be medically stable for discharge from the hospital within 2 midnights of admission.   * I certify that at the point of admission it is my clinical judgment that the  patient will require inpatient hospital care spanning beyond 2 midnights from the point of admission due to high intensity of service, high risk for further deterioration and high frequency of surveillance required.*   ------- I spent 65 minutes reviewing previous notes, at the bedside counseling/discussing the treatment plan, and performing clinical documentation.  Author: Marsha Ada, MD 03/24/2024 8:01 AM  For on call review [email or web address].

## 2024-03-24 NOTE — Progress Notes (Signed)
 Orthopaedic Trauma Service (OTS) Consult   Patient ID: Daniel [name] MRN: [id number] DOB/AGE: 88/27/1934 88 y.o.   Reason for Consult: Right hip pain Referring Physician: Roxie Bough, MD (EDP)   HPI: Daniel [name] is an 89 y.o. male who sustained a fall yesterday evening after he was done mowing his lawn.  Patient states that he lost his footing and tripped over the lawnmower collector bag and landed on his right hip.  Patient had immediate onset of pain.  He was able to bear some weight but it was excruciating.  He was brought to [facility] for further evaluation.  Initially plain x-rays of his pelvis and right hip did not demonstrate obvious fracture and due to the amount of pain he was in CT scan was obtained for further evaluation.  CT scan showed initially what appeared to be a right greater trochanter fracture.  EDP contacted orthopedics regarding weightbearing recommendations however upon review of the CT scan we did have concerns that his fracture did in fact extend into the femoral neck and intertrochanteric region.  MRI was ordered and has confirmed a nondisplaced intertrochanteric right hip fracture.  Even though his fracture is nondisplaced given his location and his activity level would recommend being aggressive with surgical intervention to get him back to his baseline level of function.    Patient seen and evaluated in the emergency department and still does complain of significant right hip soreness.  He has been laying in bed since his arrival in the ED.  He did spike a little bit of a fever on a.m. vitals.  Chest x-ray and UA have been ordered.  He has been feeling well as of late.  No evidence of upper respiratory infection.  Patient does live by himself and is very active.  States he does pretty much what ever he wants to do.  He continues to try.  He does use a cane on occasion does typically use his cane when he goes out to the store  He has 1 son  that lives in [facility] [place]  and the other that lives in [place]  He is a widower  He does understand that he will likely need some help for several weeks after surgery  Patient is on Plavix  at baseline but has not received any since yesterday morning  Patient reports that he has been told that he has a narrow airway in the past for anesthesia  Patient's last echo was in 2023.  LV EF 60 to 65% also had coronary stent in 2023 as well  Past Medical History:  Diagnosis Date   Arthritis    CAD of autologous arterial graft 2010   a) ~2010 & 2017 - LIMA-LAD was atretic (LAD filling via SVG-D2); b)  04/2022: LIMA-LAD now fully patent, 85% mSVG-OM2 =? DES PCI   CAD S/P percutaneous coronary angioplasty 11/2006   a. 6/'08: PCI nongrafted distal RCA-PDA: Mini vision BMS 2.25 mm x 28 mm; b. Relook Cath 08/2015: stable,: atretic LIMA;; 04/2022: 85% SVG-OM2  => DES PCI Synergy XD 4.0 mmx16 mm); LM 60%, pLAD 70%, p-m LAD 100% & p-m LCX 100%; 40% dRCA with patent rPDA BMS; SVG-Diag & LIMA-LAD patent   Complication of anesthesia    Coronary artery disease involving left main coronary artery [id number]   Last cath 2010: 70-80 % ostial left main, 80-90% LAD, subtotal CTO LCx-OM2, RCA patent w/ patent BMS stent (dRCA-PDA); SVG-diagonal backfills LAD, atretic LIMA-LAD --  medical therapy; stable by relook cath in March 2017   Difficult intubation    VERY NARROW AIRWAY PER ANESTHESIOLOGIST (1997 CABG AT [facility])   Dyslipidemia, goal LDL below 70    Dysrhythmia    Heart palpitations    Never fully diagnosed.   History of kidney stones    Hypertension    Insomnia    Mass in chest    JUST WATCHING , WILL CHECK OUT AFTER SURGERY   Pneumonia    S/P CABG x 3 1997   LIMA-LAD, SVG-D2, SVG-OM --> LIMA known to be atretic, but SVG-D2 fills diagonal and LAD   Stenosis of cervical spine with myelopathy (HCC)    Tingling    in fingers    Past Surgical History:  Procedure Laterality Date   48-HOUR  MONITOR  03/2017   Relatively normal.  Very infrequent ectopy.  Short PAT run with occasional PACs/PVCs and bigeminy.  No symptoms noted on monitor.  Longest PAT runs were less than 10 beats.   BACK SURGERY     decompression  lower back    CARDIAC CATHETERIZATION N/A 08/08/2015   Procedure: Left Heart Cath and Cors/Grafts Angiography;  Surgeon: Alm LELON Clay, MD;  Location: [facility] INVASIVE CV LAB;; -->  LM 80%-70%. Ost LAD 80% then prox-mid 100% (after small D1). Small LCx & OM2 patent with 100% OM2. Prox & distal RCA 20%, patent BMS in distal RCA-rPDA.  Patent/atretic LIMA-dLAD, SVG-D2 (backdills entire dLAD) & SVG-OM2.    COLONOSCOPY     CORONARY ANGIOPLASTY WITH STENT PLACEMENT  11/2006   BMS PCI of distal RCA-RPDA: PCI with a 2.25 mm x 28 mm Mini Vision bare-metal stent.   CORONARY ARTERY BYPASS GRAFT  [id number]   LIMA - LAD, SVG- diagonal, SVG-OM   CORONARY STENT INTERVENTION N/A 04/26/2022   Procedure: CORONARY STENT INTERVENTION;  Surgeon: Swaziland, Peter M, MD;  Location: [facility] INVASIVE CV LAB;  Service: CV: 95% mid SVG-OM2 => DES PCI (Synergy XD DES 4.0 mm x 16 mm) -> complicated by transient no reflow resolved with IC verapamil    EYE SURGERY     BIL CATARACT   KIDNEY STONE SURGERY     KNEE ARTHROSCOPY     RT  KNEE   LEFT HEART CATH AND CORS/GRAFTS ANGIOGRAPHY  11/2006   High-grade distal RCA and PDA stenosis --> BMS PCI.    LEFT HEART CATH AND CORS/GRAFTS ANGIOGRAPHY  2010   Native coronaries at 70% to 80% ostial left main. LAD had diffuse 80% to 90% stenosis   LEFT HEART CATH AND CORS/GRAFTS ANGIOGRAPHY N/A 04/26/2022   Procedure: LEFT HEART CATH AND CORS/GRAFTS ANGIOGRAPHY;  Surgeon: Swaziland, Peter M, MD;  Location: [facility] INVASIVE CV LAB;  Service: CV::   2 V Occlusive Native CAD: 60% LM, 70% ostial-prox LAD, 100% mid LAD, 1 100% prox-mid LCx; 40% distRCA with patent PDA stent.  Patent LIMA-LAD, SVG-D2. 95% mid SVG-OM2 => DES PCI   NM MYOVIEW  LTD  03/17/2017   No significant reversible  ischemia. Increased TID ratio 1.34. LVEF 54% with normal wall motion. This is a low risk study.   POSTERIOR CERVICAL FUSION/FORAMINOTOMY N/A 07/14/2017   Procedure: LAMINECTOMY CERVICAL THREE- CERVICAL FOUR WITH LATERAL MASS FUSION;  Surgeon: Lanis Pupa, MD;  Location: [facility] OR;  Service: Neurosurgery;  Laterality: N/A;  LAMINECTOMY CERVICAL 3- CERVICAL 4 WITH LATERAL MASS FUSION   TOTAL KNEE ARTHROPLASTY Right 04/07/2020   Procedure: TOTAL KNEE ARTHROPLASTY;  Surgeon: Rubie Kemps, MD;  Location: [facility] ORS;  Service:  Orthopedics;  Laterality: Right;   TRANSTHORACIC ECHOCARDIOGRAM  04/26/2022   EF 60 to 65%, normal LV function, no RWMA, mild concentric LVH, G1 DD, normal RV systolic function, no significant valvular abnormalities.    Family History  Problem Relation Age of Onset   Stroke Mother 48   Heart disease Mother    Arthritis Mother    Heart attack Father 71   Other Sister    Heart disease Brother    Allergic rhinitis Neg Hx    Atopy Neg Hx    Angioedema Neg Hx    Asthma Neg Hx    Eczema Neg Hx    Immunodeficiency Neg Hx    Urticaria Neg Hx     Social History:  reports that he has never smoked. He has never used smokeless tobacco. He reports that he does not drink alcohol and does not use drugs.  Allergies:  Allergies  Allergen Reactions   Penicillins Swelling and Other (See Comments)    Swelling and redness in joints  Has patient had a PCN reaction causing immediate rash, facial/tongue/throat swelling, SOB or lightheadedness with hypotension: No Has patient had a PCN reaction causing severe rash involving mucus membranes or skin necrosis: No Has patient had a PCN reaction that required hospitalization: No Has patient had a PCN reaction occurring within the last 10 years: No If all of the above answers are NO, then may proceed with Cephalosporin use.    Statins Other (See Comments)    Myalgia and leg cramps INTOLERANCE AT HIGH DOSES   Sulfa Antibiotics Other  (See Comments)    Pain in left side underneath ribcage-pancreas   Duloxetine Hcl Other (See Comments)    Dizziness    Medications: I have reviewed the patient's current medications. Current Outpatient Medications  Medication Instructions   acetaminophen  (TYLENOL ) 1,000 mg, Every 8 hours PRN   azelastine  (ASTELIN ) 0.1 % nasal spray 1-2 sprays, Each Nare, 2 times daily   cholecalciferol  (VITAMIN D ) 1,000 Units, Daily   clindamycin  (CLEOCIN ) 300 mg, 2 times daily   clopidogrel  (PLAVIX ) 75 MG tablet TAKE 1 TABLET(75 MG) BY MOUTH DAILY   cyanocobalamin  (VITAMIN B12) 1,000 mcg, 2 times daily   loratadine (CLARITIN) 10 mg, Daily PRN   metoprolol  tartrate (LOPRESSOR ) 50 MG tablet Take 25 mg ( 1/2 tablet of 50 mg) in the morning  and 75 mg  ( 1 and 1/2 tablets) at 8 pm in the evening   montelukast  (SINGULAIR ) 10 MG tablet TAKE 1 TABLET(10 MG) BY MOUTH AT BEDTIME   Multiple Vitamin (MULTIVITAMIN WITH MINERALS) TABS tablet 1 tablet, Daily   nitroGLYCERIN  (NITROSTAT ) 0.4 MG SL tablet PLACE 1 TABLET UNDER THE TONGUE EVERY 5 MINUTES AS NEEDED FOR CHEST PAIN FOR 3 DOSES. IF NO RELIEF AFTER FIRST DOSE CALL 911 OR MD   tamsulosin  (FLOMAX ) 0.4 mg, Daily at bedtime   traMADol  (ULTRAM ) 100 mg, 2 times daily   triamcinolone  (NASACORT ) 55 MCG/ACT AERO nasal inhaler 1 spray, Nasal, Daily   zolpidem  (AMBIEN ) 10 mg, Daily at bedtime     Results for orders placed or performed during the hospital encounter of 03/23/24 (from the past 48 hours)  CBC     Status: Abnormal   Collection Time: 03/23/24  7:43 PM  Result Value Ref Range   WBC 13.8 (H) 4.0 - 10.5 K/uL   RBC 3.98 (L) 4.22 - 5.81 MIL/uL   Hemoglobin 12.3 (L) 13.0 - 17.0 g/dL   HCT 62.9 (L) 60.9 - 47.9 %  MCV 93.0 80.0 - 100.0 fL   MCH 30.9 26.0 - 34.0 pg   MCHC 33.2 30.0 - 36.0 g/dL   RDW 86.5 88.4 - 84.4 %   Platelets 214 150 - 400 K/uL   nRBC 0.0 0.0 - 0.2 %    Comment: Performed at [facility], [address] [place], [place] [address]   Basic metabolic panel     Status: Abnormal   Collection Time: 03/23/24  7:43 PM  Result Value Ref Range   Sodium 140 135 - 145 mmol/L   Potassium 4.2 3.5 - 5.1 mmol/L   Chloride 104 98 - 111 mmol/L   CO2 25 22 - 32 mmol/L   Glucose, Bld 119 (H) 70 - 99 mg/dL    Comment: Glucose reference range applies only to samples taken after fasting for at least 8 hours.   BUN 27 (H) 8 - 23 mg/dL   Creatinine, Ser 8.57 (H) 0.61 - 1.24 mg/dL   Calcium  9.3 8.9 - 10.3 mg/dL   GFR, Estimated 47 (L) >60 mL/min    Comment: (NOTE) Calculated using the CKD-EPI Creatinine Equation (2021)    Anion gap 11 5 - 15    Comment: Performed at [facility], [address] [place], [place] [address]    MR HIP RIGHT WO CONTRAST Result Date: 03/24/2024 CLINICAL DATA:  Clemens.  Evaluate right hip fracture. EXAM: MR OF THE RIGHT HIP WITHOUT CONTRAST TECHNIQUE: Multiplanar, multisequence MR imaging was performed. No intravenous contrast was administered. COMPARISON:  Radiographs and CT scan 03/23/2024 FINDINGS: Bones: Both hips are normally located. Moderate age related degenerative changes. Nondisplaced intertrochanteric fracture of the right hip is evident on MRI. No femoral neck fracture. The pubic symphysis and SI joints are intact.  No pelvic fractures. No significant soft tissue injury also involving the right hip area. Grade 2 muscle tear involving the gluteus medius with inflammation/edema and probable hemorrhage in and around the muscle. No tendon rupture. No significant intrapelvic abnormalities are identified. There is a Foley catheter noted in the bladder. Sigmoid colon diverticulosis. IMPRESSION: 1. Nondisplaced intertrochanteric fracture of the right hip. 2. Grade 2 muscle tear involving the gluteus medius. 3. Intact bony pelvis. Electronically Signed   By: MYRTIS Stammer M.D.   On: 03/24/2024 09:26   CT Hip Right Wo Contrast Result Date: 03/23/2024 EXAM: CT OF THE RIGHT HIP WITHOUT IV CONTRAST 03/23/2024  08:10:23 PM TECHNIQUE: CT of the right hip was performed without the administration of intravenous contrast. Multiplanar reformatted images are provided for review. Automated exposure control, iterative reconstruction, and/or weight based adjustment of the mA/kV was utilized to reduce the radiation dose to as low as reasonably achievable. COMPARISON: Same day hip radiograph. CLINICAL HISTORY: Hip trauma, fracture suspected, xray done. Fall with right hip pain. FINDINGS: BONES: Nondisplaced comminuted fracture of the right greater trochanter. No extension into the femoral diaphysis or femoral neck. SOFT TISSUE: No significant soft tissue edema or fluid collections. No soft tissue mass. JOINT: No osseous erosions. INTRAPELVIC CONTENTS: Limited images of the intrapelvic contents are unremarkable. IMPRESSION: 1. Nondisplaced comminuted fracture of the right greater trochanter. Electronically signed by: Norman Gatlin MD 03/23/2024 08:50 PM EDT RP Workstation: [id number]   DG Knee Complete 4 Views Right Result Date: 03/23/2024 CLINICAL DATA:  fall, right hip pain, unable to bear weight on the right leg EXAM: RIGHT KNEE - COMPLETE 4+ VIEW COMPARISON:  None Available. FINDINGS: Well-aligned knee arthroplasty without acute fracture.No periprosthetic lucency to suggest loosening.No dislocation.  Multiple surgical clips in the medial aspect of the calf. No radiopaque foreign body or retained surgical instrument. IMPRESSION: Well-aligned knee arthroplasty without acute fracture or dislocation.No periprosthetic lucency to suggest hardware loosening. Electronically Signed   By: Rogelia Myers M.D.   On: 03/23/2024 19:43   DG Hip Unilat W or Wo Pelvis 2-3 Views Right Result Date: 03/23/2024 CLINICAL DATA:  fall, right hip pain EXAM: DG HIP (WITH OR WITHOUT PELVIS) 2-3V RIGHT COMPARISON:  None Available. FINDINGS: No evidence of pelvic fracture or diastasis.No acute hip fracture or dislocation.Degenerative disc disease of  the lumbar spine. Peripheral vascular atherosclerosis. IMPRESSION: No acute fracture, pelvic bone diastasis, or dislocation. Electronically Signed   By: Rogelia Myers M.D.   On: 03/23/2024 19:41   DG Ankle Complete Right Result Date: 03/23/2024 CLINICAL DATA:  fall EXAM: RIGHT ANKLE - COMPLETE 3+ VIEW COMPARISON:  None Available. FINDINGS: No acute fracture or dislocation. No ankle mortise widening. The talar dome is intact. Advanced midfoot degenerative changes. Surgical clips along the medial calf. IMPRESSION: No acute fracture or dislocation. Electronically Signed   By: Rogelia Myers M.D.   On: 03/23/2024 19:38    Intake/Output      10/17 0701 10/18 0700 10/18 0701 10/19 0700   IV Piggyback 500    Total Intake 500    Net +500            ROS Right hip pain No chest pain or shortness of breath  Blood pressure (!) 180/85, pulse 79, temperature (!) 101.2 F (38.4 C), temperature source Oral, resp. rate 20, SpO2 100%. Physical Exam Vitals and nursing note reviewed.  Constitutional:      General: He is awake. He is not in acute distress.    Appearance: Normal appearance. He is well-developed, well-groomed and normal weight.     Comments: Very pleasant  Cardiovascular:     Heart sounds: S1 normal and S2 normal.  Pulmonary:     Comments: Unlabored Abdominal:     Comments: Soft, nontender,+ bowel sounds  Feet:     Comments:  Pelvis and right lower extremity No deformities appreciated at the right leg No shortening appreciated Length and rotation appear appropriate The does have tenderness with gentle manipulation of his right hip Knee is nontender, no knee effusion No traumatic wounds DPN, SPN, TN sensory functions intact EHL, FHL, lesser toe motor functions intact Ankle flexion, extension, inversion eversion intact No DCT, compartments are soft No pain out of proportion passive stretching of toes or ankle  Neurological:     Mental Status: He is alert.  Psychiatric:         Behavior: Behavior is cooperative.     Assessment/Plan:  89 year old male s/p fall with nondisplaced right intertrochanteric hip fracture  - Fall  -Nondisplaced right intertrochanteric hip fracture  MRI does confirm fracture extension within the intertrochanteric region of his proximal femur  Given his persistent pain with movement and weightbearing in addition to the location of the fracture would recommend surgical intervention to stabilize his fracture and to prevent it from displacing   After full discussion with the patient he wishes to proceed with surgery for his right hip   He will likely need short-term SNF after discharge he states he has friends looking at [facility] and he also thinks he has a connection to [employer]    He will be weightbearing as tolerated postoperatively with no motion restrictions   - Pain management:  Multimodal - ABL anemia/Hemodynamics  Monitor -  Medical issues   Per primary   Fever   Likely related to some atelectasis    Tylenol  as needed    Aggressive IS and mobilize postoperatively  - DVT/PE prophylaxis:  TBD postoperatively  - ID:   Perioperative antibiotics  - Metabolic Bone Disease:  Fracture is a fragility fracture  - Activity:  As above  - FEN/GI prophylaxis/Foley/Lines:  Advance diet postoperatively  - Dispo:  OR today with Dr. Vernetta for intramedullary nailing of right hip    [name], PA-C [phone] (C) 03/24/2024, 11:25 AM  [facility] [address] [place] [address] [phone] (F)    After 5pm and on the weekends please log on to Amion, go to orthopaedics and the look under the [facility] Call for the provider(s) on call. You can also call our office at [phone] and then follow the prompts to be connected to the call team. Patient ID: [name], male   DOB: 07/17/1932, 89 y.o.   MRN: [id number]

## 2024-03-25 DIAGNOSIS — S72144A Nondisplaced intertrochanteric fracture of right femur, initial encounter for closed fracture: Secondary | ICD-10-CM | POA: Diagnosis not present

## 2024-03-25 LAB — BASIC METABOLIC PANEL WITH GFR
Anion gap: 9 (ref 5–15)
BUN: 27 mg/dL — ABNORMAL HIGH (ref 8–23)
CO2: 23 mmol/L (ref 22–32)
Calcium: 8.3 mg/dL — ABNORMAL LOW (ref 8.9–10.3)
Chloride: 105 mmol/L (ref 98–111)
Creatinine, Ser: 1.49 mg/dL — ABNORMAL HIGH (ref 0.61–1.24)
GFR, Estimated: 45 mL/min — ABNORMAL LOW (ref >60)
Glucose, Bld: 123 mg/dL — ABNORMAL HIGH (ref 70–99)
Potassium: 4.4 mmol/L (ref 3.5–5.1)
Sodium: 137 mmol/L (ref 135–145)

## 2024-03-25 LAB — CBC
HCT: 29.4 % — ABNORMAL LOW (ref 39.0–52.0)
Hemoglobin: 9.7 g/dL — ABNORMAL LOW (ref 13.0–17.0)
MCH: 30.8 pg (ref 26.0–34.0)
MCHC: 33 g/dL (ref 30.0–36.0)
MCV: 93.3 fL (ref 80.0–100.0)
Platelets: 145 K/uL — ABNORMAL LOW (ref 150–400)
RBC: 3.15 MIL/uL — ABNORMAL LOW (ref 4.22–5.81)
RDW: 13.8 % (ref 11.5–15.5)
WBC: 12.3 K/uL — ABNORMAL HIGH (ref 4.0–10.5)
nRBC: 0 % (ref 0.0–0.2)

## 2024-03-25 MED ORDER — SODIUM CHLORIDE 0.9 % IV SOLN: INTRAVENOUS | Status: AC

## 2024-03-25 NOTE — Progress Notes (Signed)
 Subjective: 1 Day Post-Op Procedure(s) (LRB): FIXATION, FRACTURE, INTERTROCHANTERIC, WITH INTRAMEDULLARY ROD (Right) Patient reports pain as moderate.  Reports muscle spasm. Denies any lightheadedness or dizziness.   Objective: Vital signs in last 24 hours: Temp:  [98.4 F (36.9 C)-101.2 F (38.4 C)] 98.4 F (36.9 C) (10/19 0440) Pulse Rate:  [69-95] 76 (10/19 0440) Resp:  [17-20] 18 (10/19 0440) BP: (114-180)/(58-85) 137/58 (10/19 0440) SpO2:  [93 %-100 %] 95 % (10/19 0440) Weight:  [86.6 kg] 86.6 kg (10/18 1219)  Intake/Output from previous day: 10/18 0701 - 10/19 0700 In: 1292 [I.V.:992; IV Piggyback:300] Out: 1400 [Urine:1300; Blood:100] Intake/Output this shift: No intake/output data recorded.  Recent Labs    03/23/24 1943 03/25/24 0741  HGB 12.3* 9.7*   Recent Labs    03/23/24 1943 03/25/24 0741  WBC 13.8* 12.3*  RBC 3.98* 3.15*  HCT 37.0* 29.4*  PLT 214 145*   Recent Labs    03/23/24 1943  NA 140  K 4.2  CL 104  CO2 25  BUN 27*  CREATININE 1.42*  GLUCOSE 119*  CALCIUM  9.3   No results for input(s): LABPT, INR in the last 72 hours.  Intact pulses distally Dorsiflexion/Plantar flexion intact Incision: scant drainage Compartment soft   Assessment/Plan: 1 Day Post-Op Procedure(s) (LRB): FIXATION, FRACTURE, INTERTROCHANTERIC, WITH INTRAMEDULLARY ROD (Right) Up with therapy Weight bearing as tolerated right lower extremity Monitor for symptoms of anemia  On chronic anticoagulation (Plavix )     Daniel [name] 03/25/2024, 8:20 AM

## 2024-03-25 NOTE — Progress Notes (Signed)
 PROGRESS NOTE  Daniel [name]  DOB: 1932/12/26  PCP: Shepard Ade, MD FMW:[id number]  DOA: 03/23/2024  LOS: 1 day  Hospital Day: 3  Subjective: Patient was seen and examined this morning. Pleasant elderly Caucasian male.  Lying on recliner.  Not in distress.  Son at bedside. Chart reviewed.  Blood pressure elevated postop, up to 160s last night On low-flow oxygen at 2 L this morning. Labs from this morning with hemoglobin down to 9.7, platelet 145  Brief narrative: Daniel [name] is a 89 y.o. male with PMH significant for HTN, HLD, CAD (s/p CABG in 1997; PCI to RCA in 11/2006; PCI to OM in 04/2022), cervical spinal stenosis, 10/17, patient was brought to the ED after ground-level fall. Per report, patient fell in the garage after tripping over the lawnmower.  He sustained immediate right hip pain and was unable to get up or put any weight on it.  Also sustained an injury to the arm, with skin being peeled back from hitting the sweeper. Neighbor called EMS and patient was brought to the ED  In the ED, patient was tachycardic, hypertensive Labs showed creatinine elevated 1.42, WBC count 13.8 CT right hip showed nondisplaced comminuted fracture of the right greater trochanter  MRI right hip confirmed the same finding. Orthopedics was consulted Admitted to [facility] 10/8, underwent ORIF by Dr. Vernetta  Assessment and plan: Acute nondisplaced comminuted fracture of the right greater trochanter S/p ORIF -10/18 Dr. Vernetta Secondary to mechanical fall Imaging and procedure as above PT eval pending Bowel regimen:Colace 100 mg twice daily DVT prophylaxis:SCDs Pain regimen --- Scheduled:  --- PRN: Tylenol , oxycodone ,, Robaxin , IV Dilaudid, IV morphine ,   AKI Baseline creatinine normal from 2024.  Presented with creatinine elevated 1.42.  Slightly up at 1.49 today.   Continue normal saline -75 mL/h Continue IV hydration for next 24 hours.  Recent Labs    03/23/24 1943  03/25/24 0741  BUN 27* 27*  CREATININE 1.42* 1.49*  CO2 25 23   Acute blood loss anemia Dropped hemoglobin to 9.7 today secondary to fracture and surgical blood loss No other source of bleeding. Continue to monitor.  Transfuse if less than 8 given his significant cardiac history Continue Protonix  Recent Labs    03/23/24 1943 03/25/24 0741  HGB 12.3* 9.7*  MCV 93.0 93.3   Thrombocytopenia Platelet low at 145 today.  On SCDs.  Will try to avoid heparin  product Recent Labs  Lab 03/23/24 1943 03/25/24 0741  PLT 214 145*    H/o CAD s/p CABG in 1997; PCI to RCA in 11/2006; PCI to OM in 04/2022 PTA meds- Plavix  75mg  daily Plavix  has been resumed this morning.   HTN PTA meds- metoprolol  tartrate twice daily 25 mg-75 mg Continue the same.   BPH Continue Flomax   Impaired mobility  H/o cervical spinal stenosis Pending PT eval     Mobility:  PT Orders: Active   PT Follow up Rec: Skilled Nursing-Short Term Rehab (<3 Hours/Day)03/25/2024 1400   Goals of care   Code Status: Full Code     DVT prophylaxis:  SCDs Start: 03/24/24 1447 SCDs Start: 03/24/24 0800   Antimicrobials: None Fluid: NS 75 mL/h Consultants: Orthopedics Family Communication: Son at bedside  Status: Inpatient Level of care:  Med-Surg   Patient is from: Home Needs to continue in-hospital care: Ongoing postop care Anticipated d/c to: SNF recommended by PT    Diet:  Diet Order  Diet regular Room service appropriate? Yes; Fluid consistency: Thin  Diet effective now                   Scheduled Meds:  clopidogrel   75 mg Oral Daily   docusate sodium   100 mg Oral BID   feeding supplement  237 mL Oral BID BM   Influenza vac split trivalent PF  0.5 mL Intramuscular Tomorrow-1000   metoprolol  tartrate  25 mg Oral q AM   And   metoprolol  tartrate  75 mg Oral Q24H   pantoprazole   40 mg Oral Daily   tamsulosin   0.4 mg Oral QHS    PRN meds: acetaminophen , alum & mag  hydroxide-simeth, HYDROmorphone (DILAUDID) injection, menthol  **OR** phenol, methocarbamol  **OR** methocarbamol  (ROBAXIN ) injection, metoCLOPramide **OR** metoCLOPramide (REGLAN) injection, morphine  injection, ondansetron  **OR** ondansetron  (ZOFRAN ) IV, oxyCODONE , oxyCODONE    Infusions:   sodium chloride  75 mL/hr at 03/25/24 1212    Antimicrobials: Anti-infectives (From admission, onward)    Start     Dose/Rate Route Frequency Ordered Stop   03/24/24 1800  ceFAZolin  (ANCEF ) IVPB 2g/100 mL premix        2 g 200 mL/hr over 30 Minutes Intravenous Every 6 hours 03/24/24 1446 03/25/24 0151   03/24/24 1130  ceFAZolin  (ANCEF ) IVPB 2g/100 mL premix        2 g 200 mL/hr over 30 Minutes Intravenous On call to O.R. 03/24/24 1124 03/24/24 1255       Objective: Vitals:   03/25/24 0440 03/25/24 1432  BP: (!) 137/58 (!) 138/52  Pulse: 76 (!) 109  Resp: 18 15  Temp: 98.4 F (36.9 C) 100.1 F (37.8 C)  SpO2: 95% 95%    Intake/Output Summary (Last 24 hours) at 03/25/2024 1436 Last data filed at 03/25/2024 0508 Gross per 24 hour  Intake 292.04 ml  Output 700 ml  Net -407.96 ml   Filed Weights   03/24/24 1219  Weight: 86.6 kg   Weight change:  Body mass index is 23.87 kg/m.   Physical Exam: General exam: Pleasant, elderly Caucasian male.  Not in distress Skin: No rashes, lesions or ulcers. HEENT: Atraumatic, normocephalic, no obvious bleeding Lungs: Clear to auscultation bilaterally,  CVS: S1, S2, no murmur,   GI/Abd: Soft, nontender, nondistended, bowel sound present,   CNS: Alert, awake, oriented x 3 Psychiatry: Mood appropriate Extremities: No pedal edema, no calf tenderness,   Data Review: I have personally reviewed the laboratory data and studies available.  F/u labs ordered Unresulted Labs (From admission, onward)     Start     Ordered   03/26/24 0500  Basic metabolic panel with GFR  Tomorrow morning,   R        03/25/24 0956   03/26/24 0500  CBC with  Differential/Platelet  Tomorrow morning,   R        03/25/24 0956            Signed, Chapman Rota, MD [facility] 03/25/2024

## 2024-03-25 NOTE — Evaluation (Signed)
 Physical Therapy Evaluation Patient Details Name: Daniel [name] MRN: [id number] DOB: 1932/08/12 Today's Date: 03/25/2024  History of Present Illness  Daniel [name] is a 89 y.o. male presents with ground level fall in garage, sustaining R hip fracture, in ED noting AKI as well; S/p Open reduction/internal fixation of right intertrochanteric hip fracture (WBAT) on 10/18; with medical history significant of CAD (s/p CABG in 1997; PCI to RCA in 11/2006; PCI to OM in 04/2022), HTN, and HLD  Clinical Impression   Pt admitted with above diagnosis. Lives at home alone, in a single-level home with a few steps to enter; Prior to admission, pt was able to manage independently, using a cane for ambulation; enjoys working on cars; Presents to PT with RLE pain, decr functional mobility, decr activity tolerance; Needs min assist/CGA for all aspects of mobility, but tolerated initial ambulation bout well; walked on room air, and noted some mild DOE 2/4, which pt states is his normal; Pt lives alone, and he wants to be able to get back home; he welcomes the idea of post-acute rehab to maximize independence and safety with mobility and ADls;  Pt currently with functional limitations due to the deficits listed below (see PT Problem List). Pt will benefit from skilled PT to increase their independence and safety with mobility to allow discharge to the venue listed below.           If plan is discharge home, recommend the following: A little help with walking and/or transfers;A little help with bathing/dressing/bathroom;Assist for transportation   Can travel by private vehicle   Yes    Equipment Recommendations Rolling walker (2 wheels);BSC/3in1  Recommendations for Other Services       Functional Status Assessment Patient has had a recent decline in their functional status and demonstrates the ability to make significant improvements in function in a reasonable and predictable amount of time.     Precautions  / Restrictions Precautions Precautions: Fall Restrictions Weight Bearing Restrictions Per Provider Order: No RLE Weight Bearing Per Provider Order: Weight bearing as tolerated      Mobility  Bed Mobility Overal bed mobility: Needs Assistance Bed Mobility: Supine to Sit     Supine to sit: Min assist     General bed mobility comments: used bedrails; incr time    Transfers Overall transfer level: Needs assistance Equipment used: Rolling walker (2 wheels) Transfers: Sit to/from Stand Sit to Stand: Contact guard assist           General transfer comment: Good rise; CGA for safety    Ambulation/Gait Ambulation/Gait assistance: Contact guard assist Gait Distance (Feet): 24 Feet Assistive device: Rolling walker (2 wheels) Gait Pattern/deviations: Step-through pattern (emerging)       General Gait Details: Cues for gait sequence and optimal step length; adjusted RW height for better fit  Stairs            Wheelchair Mobility     Tilt Bed    Modified Rankin (Stroke Patients Only)       Balance Overall balance assessment: Needs assistance   Sitting balance-Leahy Scale: Fair       Standing balance-Leahy Scale: Poor (approaching Fair)                               Pertinent Vitals/Pain Pain Assessment Pain Assessment: 0-10 Pain Score: 3  Pain Location: R hip Pain Descriptors / Indicators: Grimacing, Guarding Pain Intervention(s): Monitored during session,  Premedicated before session    Home Living Family/patient expects to be discharged to:: Skilled nursing facility Living Arrangements: Alone                 Additional Comments: Pt hopes to get to [facility] or to [place] for post-acute rehab    Prior Function Prior Level of Function : Independent/Modified Independent             Mobility Comments: Typically uses a cane for amb ADLs Comments: Independent     Extremity/Trunk Assessment   Upper Extremity  Assessment Upper Extremity Assessment: Defer to OT evaluation    Lower Extremity Assessment Lower Extremity Assessment: RLE deficits/detail RLE Deficits / Details: Stiff and slow-moving, but able to flex hip and knee in supine to get to hooklying position with min assist; incr pain with movement, but good weight acceptance in stance       Communication   Communication Communication: No apparent difficulties    Cognition Arousal: Alert Behavior During Therapy: WFL for tasks assessed/performed   PT - Cognitive impairments: No apparent impairments                         Following commands: Intact       Cueing Cueing Techniques: Verbal cues     General Comments General comments (skin integrity, edema, etc.): Son present and suportive    Exercises     Assessment/Plan    PT Assessment Patient needs continued PT services  PT Problem List Decreased strength;Decreased range of motion;Decreased activity tolerance;Decreased balance;Decreased mobility;Decreased coordination;Decreased knowledge of use of DME;Decreased safety awareness;Decreased knowledge of precautions;Pain       PT Treatment Interventions DME instruction;Gait training;Stair training;Functional mobility training;Therapeutic activities;Therapeutic exercise;Balance training;Patient/family education    PT Goals (Current goals can be found in the Care Plan section)  Acute Rehab PT Goals Patient Stated Goal: get to rehab to be independent to go home PT Goal Formulation: With patient Time For Goal Achievement: 04/08/24 Potential to Achieve Goals: Good    Frequency Min 3X/week     Co-evaluation               AM-PAC PT 6 Clicks Mobility  Outcome Measure Help needed turning from your back to your side while in a flat bed without using bedrails?: A Little Help needed moving from lying on your back to sitting on the side of a flat bed without using bedrails?: A Little Help needed moving to and  from a bed to a chair (including a wheelchair)?: A Little Help needed standing up from a chair using your arms (e.g., wheelchair or bedside chair)?: A Little Help needed to walk in hospital room?: A Little Help needed climbing 3-5 steps with a railing? : A Lot 6 Click Score: 17    End of Session   Activity Tolerance: Patient tolerated treatment well Patient left: in chair;with call bell/phone within reach;with chair alarm set Nurse Communication: Mobility status PT Visit Diagnosis: Unsteadiness on feet (R26.81)    Time: 9046-8979 PT Time Calculation (min) (ACUTE ONLY): 27 min   Charges:   PT Evaluation $PT Eval Moderate Complexity: 1 Mod PT Treatments $Gait Training: 8-22 mins PT General Charges $$ ACUTE PT VISIT: 1 Visit         Silvano Currier, PT  Acute Rehabilitation Services Office [phone] Secure Chat welcomed   Silvano VEAR Currier 03/25/2024, 2:29 PM

## 2024-03-25 NOTE — Evaluation (Signed)
 Occupational Therapy Evaluation Patient Details Name: Daniel [name] MRN: [id number] DOB: 1933-05-14 Today's Date: 03/25/2024   History of Present Illness   Daniel [name] is a 89 y.o. male presents with ground level fall in garage, sustaining R hip fracture, in ED noting AKI as well; S/p Open reduction/internal fixation of right intertrochanteric hip fracture (WBAT) on 10/18; with medical history significant of CAD (s/p CABG in 1997; PCI to RCA in 11/2006; PCI to OM in 04/2022), HTN, and HLD     Clinical Impressions Patient reports living alone in a 1 level home with a couple steps to enter.  Reports being Independent in ADLs and IADLs and is a baseline very active and uses a cane for mod Indep for functional mobility.  Patient requires mod/max A for LB ADLs and minA  for functional mobility with RW.  Patient would benefit from additional OT intervention to address functional deficits of overall safety, activity tolerance, strength and ADL tasks.       If plan is discharge home, recommend the following:   A little help with walking and/or transfers;Assistance with cooking/housework;Assist for transportation;Help with stairs or ramp for entrance;A lot of help with bathing/dressing/bathroom     Functional Status Assessment   Patient has had a recent decline in their functional status and demonstrates the ability to make significant improvements in function in a reasonable and predictable amount of time.     Equipment Recommendations   Other (comment) (defer to next level of care)     Recommendations for Other Services         Precautions/Restrictions   Precautions Precautions: Fall Restrictions Weight Bearing Restrictions Per Provider Order: No RLE Weight Bearing Per Provider Order: Weight bearing as tolerated     Mobility Bed Mobility Overal bed mobility:  (Patient sitting upright in chair upon  arrival into room)                  Transfers Overall  transfer level: Needs assistance Equipment used: Rolling walker (2 wheels) Transfers: Sit to/from Stand Sit to Stand: Min assist                  Balance Overall balance assessment: Needs assistance Sitting-balance support: Feet supported                                       ADL either performed or assessed with clinical judgement   ADL Overall ADL's : Needs assistance/impaired Eating/Feeding: Independent;Sitting   Grooming: Wash/dry hands;Wash/dry face;Sitting;Set up   Upper Body Bathing: Set up;Sitting   Lower Body Bathing: Moderate assistance   Upper Body Dressing : Set up;Sitting   Lower Body Dressing: Maximal assistance   Toilet Transfer: Minimal assistance   Toileting- Clothing Manipulation and Hygiene: Maximal assistance       Functional mobility during ADLs: Minimal assistance;Rolling walker (2 wheels)       Vision Baseline Vision/History: 1 Wears glasses Patient Visual Report: No change from baseline Vision Assessment?: No apparent visual deficits     Perception Perception: Within Functional Limits       Praxis         Pertinent Vitals/Pain Pain Assessment Pain Assessment: 0-10 Pain Score: 2  Pain Location: R hip Pain Descriptors / Indicators: Grimacing, Guarding Pain Intervention(s): Monitored during session     Extremity/Trunk Assessment Upper Extremity Assessment Upper Extremity Assessment: Overall WFL for tasks assessed  Lower Extremity Assessment Lower Extremity Assessment: Defer to PT evaluation RLE Deficits / Details: Stiff and slow-moving, but able to flex hip and knee in supine to get to hooklying position with min assist; incr pain with movement, but good weight acceptance in stance   Cervical / Trunk Assessment Cervical / Trunk Assessment: Normal   Communication Communication Communication: No apparent difficulties   Cognition Arousal: Alert Behavior During Therapy: WFL for tasks  assessed/performed Cognition: No apparent impairments                               Following commands: Intact       Cueing  General Comments   Cueing Techniques: Verbal cues  Son present and suportive   Exercises     Shoulder Instructions      Home Living Family/patient expects to be discharged to:: Skilled nursing facility Living Arrangements: Alone                               Additional Comments: Pt hopes to get to [facility] or to [facility] for post-acute rehab      Prior Functioning/Environment Prior Level of Function : Independent/Modified Independent             Mobility Comments: Typically uses a cane for amb ADLs Comments: Independent    OT Problem List: Decreased strength;Decreased safety awareness;Decreased activity tolerance   OT Treatment/Interventions: Self-care/ADL training;Therapeutic exercise;Therapeutic activities;Patient/family education      OT Goals(Current goals can be found in the care plan section)   Acute Rehab OT Goals OT Goal Formulation: With patient Time For Goal Achievement: 04/08/24 Potential to Achieve Goals: Good   OT Frequency:  Min 2X/week    Co-evaluation              AM-PAC OT 6 Clicks Daily Activity     Outcome Measure Help from another person eating meals?: None Help from another person taking care of personal grooming?: A Little Help from another person toileting, which includes using toliet, bedpan, or urinal?: A Lot Help from another person bathing (including washing, rinsing, drying)?: A Lot Help from another person to put on and taking off regular upper body clothing?: A Little Help from another person to put on and taking off regular lower body clothing?: A Lot 6 Click Score: 16   End of Session Equipment Utilized During Treatment: Rolling walker (2 wheels) Nurse Communication: Mobility status  Activity Tolerance: Patient tolerated treatment well Patient left:  with call bell/phone within reach  OT Visit Diagnosis: Unsteadiness on feet (R26.81);History of falling (Z91.81)                Time: 8669-8646 OT Time Calculation (min): 23 min Charges:  OT General Charges $OT Visit: 1 Visit OT Evaluation $OT Eval Moderate Complexity: 1 Mod OT Treatments $Self Care/Home Management : 8-22 mins  Lamarr Pouch OT/L  Lamarr JONETTA Pouch 03/25/2024, 3:46 PM

## 2024-03-25 NOTE — Discharge Instructions (Signed)
 You may put all of your weight on your right hip as comfort allows. Only be up using a walker until your balance improves. The right hip dressings can be changed as needed.

## 2024-03-25 NOTE — Plan of Care (Signed)
  Problem: Activity: Goal: Risk for activity intolerance will decrease Outcome: Progressing   Problem: Pain Managment: Goal: General experience of comfort will improve and/or be controlled Outcome: Progressing   Problem: Safety: Goal: Ability to remain free from injury will improve Outcome: Progressing   Problem: Skin Integrity: Goal: Risk for impaired skin integrity will decrease Outcome: Progressing

## 2024-03-26 ENCOUNTER — Encounter (HOSPITAL_COMMUNITY): Payer: Self-pay | Admitting: Orthopaedic Surgery

## 2024-03-26 DIAGNOSIS — S72144A Nondisplaced intertrochanteric fracture of right femur, initial encounter for closed fracture: Secondary | ICD-10-CM | POA: Diagnosis not present

## 2024-03-26 LAB — BASIC METABOLIC PANEL WITH GFR
Anion gap: 6 (ref 5–15)
BUN: 32 mg/dL — ABNORMAL HIGH (ref 8–23)
CO2: 24 mmol/L (ref 22–32)
Calcium: 8.3 mg/dL — ABNORMAL LOW (ref 8.9–10.3)
Chloride: 107 mmol/L (ref 98–111)
Creatinine, Ser: 1.48 mg/dL — ABNORMAL HIGH (ref 0.61–1.24)
GFR, Estimated: 45 mL/min — ABNORMAL LOW (ref >60)
Glucose, Bld: 115 mg/dL — ABNORMAL HIGH (ref 70–99)
Potassium: 4.5 mmol/L (ref 3.5–5.1)
Sodium: 137 mmol/L (ref 135–145)

## 2024-03-26 LAB — GLUCOSE, CAPILLARY: Glucose-Capillary: 97 mg/dL (ref 70–99)

## 2024-03-26 LAB — CBC WITH DIFFERENTIAL/PLATELET
Abs Immature Granulocytes: 0.03 K/uL (ref 0.00–0.07)
Basophils Absolute: 0 K/uL (ref 0.0–0.1)
Basophils Relative: 0 %
Eosinophils Absolute: 0.3 K/uL (ref 0.0–0.5)
Eosinophils Relative: 3 %
HCT: 26.1 % — ABNORMAL LOW (ref 39.0–52.0)
Hemoglobin: 8.6 g/dL — ABNORMAL LOW (ref 13.0–17.0)
Immature Granulocytes: 0 %
Lymphocytes Relative: 15 %
Lymphs Abs: 1.3 K/uL (ref 0.7–4.0)
MCH: 30.8 pg (ref 26.0–34.0)
MCHC: 33 g/dL (ref 30.0–36.0)
MCV: 93.5 fL (ref 80.0–100.0)
Monocytes Absolute: 0.8 K/uL (ref 0.1–1.0)
Monocytes Relative: 9 %
Neutro Abs: 6.3 K/uL (ref 1.7–7.7)
Neutrophils Relative %: 73 %
Platelets: 134 K/uL — ABNORMAL LOW (ref 150–400)
RBC: 2.79 MIL/uL — ABNORMAL LOW (ref 4.22–5.81)
RDW: 13.9 % (ref 11.5–15.5)
WBC: 8.8 K/uL (ref 4.0–10.5)
nRBC: 0 % (ref 0.0–0.2)

## 2024-03-26 MED ORDER — ACETAMINOPHEN 500 MG PO TABS
1000.0000 mg | ORAL_TABLET | Freq: Three times a day (TID) | ORAL | Status: DC
  Administered 2024-03-26 – 2024-03-29 (×10): 1000 mg via ORAL
  Filled 2024-03-26 (×10): qty 2

## 2024-03-26 NOTE — Plan of Care (Signed)
  Problem: Pain Managment: Goal: General experience of comfort will improve and/or be controlled Outcome: Progressing   Problem: Safety: Goal: Ability to remain free from injury will improve Outcome: Progressing   Problem: Skin Integrity: Goal: Risk for impaired skin integrity will decrease Outcome: Progressing

## 2024-03-26 NOTE — TOC Progression Note (Signed)
 Transition of Care [facility]) - Progression Note    Patient Details  Name: Daniel [name] MRN: [id number] Date of Birth: Mar 15, 1933  Transition of Care [facility]) CM/SW Contact  Bridget Cordella Simmonds, LCSW Phone Number: 03/26/2024, 3:08 PM  Clinical Narrative:   PASSR received: [id number] [id number].    Expected Discharge Plan: Skilled Nursing Facility Barriers to Discharge: Continued Medical Work up               Expected Discharge Plan and Services       Living arrangements for the past 2 months: Single Family Home                                       Social Drivers of Health (SDOH) Interventions SDOH Screenings   Food Insecurity: No Food Insecurity (03/24/2024)  Housing: Low Risk  (03/24/2024)  Transportation Needs: No Transportation Needs (03/24/2024)  Utilities: Not At Risk (03/24/2024)  Social Connections: Moderately Isolated (03/24/2024)  Tobacco Use: Low Risk  (03/24/2024)    Readmission Risk Interventions     No data to display

## 2024-03-26 NOTE — TOC Initial Note (Signed)
 Transition of Care [facility]) - Initial/Assessment Note    Patient Details  Name: Daniel [name] MRN: [id number] Date of Birth: 08/19/1932  Transition [facility] Care [facility]) CM/SW Contact:    Rosalva Jon Bloch, RN Phone Number: 03/26/2024, 11:36 AM  Clinical Narrative:                 Presents after a fall, suffered R hip fracture.   - S/p ORIF of right intertrochanteric hip fracture 10/18 RNCM received consult for possible SNF placement at time of discharge. RNCM spoke with patient regarding PT recommendation of SNF placement at time of discharge. Patient reported that patient's he lives alone and  is currently unable to care for self independently at  home given his current physical needs and fall risk. Patient expressed understanding of PT recommendation and is agreeable to SNF placement at time of discharge. Patient reports preference : [facility] . RNCM discussed insurance authorization process and provided Medicare SNF ratings list. Patient expressed being hopeful for rehab and to feel better soon. No further questions reported at this time. RNCM to continue to follow and assist with discharge planning needs.   Expected Discharge Plan: Skilled Nursing Facility Barriers to Discharge: Continued Medical Work up   Patient Goals and CMS Choice Patient states their goals for this hospitalization and ongoing recovery are:: to get better and go home   Choice offered to / list presented to : Patient      Expected Discharge Plan and Services       Living arrangements for the past 2 months: Single Family Home                                      Prior Living Arrangements/Services Living arrangements for the past 2 months: Single Family Home Lives with:: Self   Do you feel safe going back to the place where you live?: Yes      Need for Family Participation in Patient Care: Yes (Comment) Care giver support system in place?: No (comment)   Criminal Activity/Legal Involvement  Pertinent to Current Situation/Hospitalization: No - Comment as needed  Activities of Daily Living   ADL Screening (condition at time of admission) Independently performs ADLs?: Yes (appropriate for developmental age) Is the patient deaf or have difficulty hearing?: No Does the patient have difficulty seeing, even when wearing glasses/contacts?: No Does the patient have difficulty concentrating, remembering, or making decisions?: No  Permission Sought/Granted                  Emotional Assessment Appearance:: Appears stated age     Orientation: : Oriented to Self, Oriented to Place, Oriented to  Time, Oriented to Situation Alcohol / Substance Use: Not Applicable Psych Involvement: No (comment)  Admission diagnosis:  Closed right hip fracture (HCC) [S72.001A] Closed fracture of right hip, initial encounter (HCC) [S72.001A] Patient Active Problem List   Diagnosis Date Noted   Closed nondisplaced intertrochanteric fracture of right femur (HCC) 03/24/2024   Pedal edema 09/03/2023   Abnormal weight loss 03/12/2023   Nodule of left anterior chest wall 03/12/2023   Myalgia due to statin 05/22/2021   Branch retinal vein occlusion of left eye with macular edema (HCC) 09/16/2020   Chronic pain of right knee 03/19/2020   Hypertensive retinopathy of both eyes 11/13/2017   Stenosis of cervical spine with myelopathy (HCC) 07/14/2017   Pseudophakia of both eyes 05/24/2017  Stable branch retinal vein occlusion of right eye (HCC) 05/24/2017   DOE (dyspnea on exertion) 03/10/2017   Acute maxillary sinusitis 02/15/2017   Allergic rhinitis 02/15/2017   Preoperative cardiovascular examination 08/16/2016   Chronic pain of both knees 07/13/2016   Fatigue due to treatment 09/11/2015   Unstable angina (HCC) 08/06/2015   Low TSH level 08/13/2014   Orthostatic hypotension 12/17/2013   Pseudoptosis 05/22/2013   Dermatochalasis 02/21/2013   Epiphora 02/21/2013   S/P CABG x 3    Labile  essential hypertension    Hyperlipidemia with target low density lipoprotein (LDL) cholesterol less than 55 mg/dL    Heart palpitations    Nuclear cataract 01/26/2012   PCO (posterior capsular opacification) 01/26/2012   Pseudophakia 01/26/2012   LM-CAD:  LIMA-LAD, patent SVG-D2 & SVG-OM2 (DES PCI) , BMS PCI rPDA 07/27/1995   PCP:  Shepard Ade, MD Pharmacy:   [facility] [phone] - [place], [place] - [address] [place] [address] Phone: [phone] Fax: [phone]     Social Drivers of Health (SDOH) Social History: SDOH Screenings   Food Insecurity: No Food Insecurity (03/24/2024)  Housing: Low Risk  (03/24/2024)  Transportation Needs: No Transportation Needs (03/24/2024)  Utilities: Not At Risk (03/24/2024)  Social Connections: Moderately Isolated (03/24/2024)  Tobacco Use: Low Risk  (03/24/2024)   SDOH Interventions:     Readmission Risk Interventions     No data to display

## 2024-03-26 NOTE — Plan of Care (Signed)
  Problem: Education: Goal: Knowledge of General Education information will improve Description: Including pain rating scale, medication(s)/side effects and non-pharmacologic comfort measures Outcome: Progressing   Problem: Health Behavior/Discharge Planning: Goal: Ability to manage health-related needs will improve Outcome: Progressing   Problem: Clinical Measurements: Goal: Ability to maintain clinical measurements within normal limits will improve Outcome: Progressing Goal: Will remain free from infection Outcome: Progressing Goal: Diagnostic test results will improve Outcome: Progressing Goal: Respiratory complications will improve Outcome: Progressing Goal: Cardiovascular complication will be avoided Outcome: Progressing   Problem: Activity: Goal: Risk for activity intolerance will decrease Outcome: Progressing   Problem: Nutrition: Goal: Adequate nutrition will be maintained Outcome: Progressing   Problem: Coping: Goal: Level of anxiety will decrease Outcome: Progressing   Problem: Elimination: Goal: Will not experience complications related to bowel motility Outcome: Progressing Goal: Will not experience complications related to urinary retention Outcome: Progressing   Problem: Pain Managment: Goal: General experience of comfort will improve and/or be controlled Outcome: Progressing   Problem: Safety: Goal: Ability to remain free from injury will improve Outcome: Progressing   Problem: Skin Integrity: Goal: Risk for impaired skin integrity will decrease Outcome: Progressing   Problem: Education: Goal: Verbalization of understanding the information provided (i.e., activity precautions, restrictions, etc) will improve Outcome: Progressing   Problem: Activity: Goal: Ability to ambulate and perform ADLs will improve Outcome: Progressing   Problem: Clinical Measurements: Goal: Postoperative complications will be avoided or minimized Outcome:  Progressing   Problem: Self-Concept: Goal: Ability to maintain and perform role responsibilities to the fullest extent possible will improve Outcome: Progressing   Problem: Pain Management: Goal: Pain level will decrease Outcome: Progressing

## 2024-03-26 NOTE — TOC CAGE-AID Note (Signed)
 Transition of Care [facility]) - CAGE-AID Screening   Patient Details  Name: Daniel [name] MRN: [id number] Date of Birth: Sep 09, 1932  Transition [facility] Care [facility]) CM/SW Contact:    Falecia Vannatter E Greogory Cornette, LCSW Phone Number: 03/26/2024, 10:28 AM   Clinical Narrative: No SA noted.   CAGE-AID Screening:    Have You Ever Felt You Ought to Cut Down on Your Drinking or Drug Use?: No Have People Annoyed You By Critizing Your Drinking Or Drug Use?: No Have You Felt Bad Or Guilty About Your Drinking Or Drug Use?: No Have You Ever Had a Drink or Used Drugs First Thing In The Morning to Steady Your Nerves or to Get Rid of a Hangover?: No CAGE-AID Score: 0  Substance Abuse Education Offered: No

## 2024-03-26 NOTE — Progress Notes (Signed)
 Physical Therapy Treatment Patient Details Name: Daniel [name] MRN: [id number] DOB: Aug 24, 1932 Today's Date: 03/26/2024   History of Present Illness Daniel [name] is a 89 y.o. male presents with ground level fall in garage, sustaining R hip fracture, in ED noting AKI as well; S/p Open reduction/internal fixation of right intertrochanteric hip fracture (WBAT) on 10/18; with medical history significant of CAD (s/p CABG in 1997; PCI to RCA in 11/2006; PCI to OM in 04/2022), HTN, and HLD    PT Comments  Continuing work on functional mobility and activity tolerance;  Reports feeling overall more sore today, but walking seems to have helped with pain and malaise; Able to incr distance today, and showing better use of RW; placed blankets in recliner seat to incr seat height; Patient will benefit from continued inpatient follow up therapy, <3 hours/day to maximize independence and safety with mobility and adls and allow for safe dc home   If plan is discharge home, recommend the following: A little help with walking and/or transfers;A little help with bathing/dressing/bathroom;Assist for transportation   Can travel by private vehicle     Yes  Equipment Recommendations  Rolling walker (2 wheels);BSC/3in1    Recommendations for Other Services       Precautions / Restrictions Precautions Precautions: Fall Precaution/Restrictions Comments: Fall risk is present, and minized with use of RW Restrictions RLE Weight Bearing Per Provider Order: Weight bearing as tolerated     Mobility  Bed Mobility               General bed mobility comments: EOB upon arriva    Transfers Overall transfer level: Needs assistance Equipment used: Rolling walker (2 wheels) Transfers: Sit to/from Stand Sit to Stand: Mod assist           General transfer comment: Light mod assist to rise; bed elevated    Ambulation/Gait Ambulation/Gait assistance: Contact guard assist Gait Distance (Feet): 55  Feet Assistive device: Rolling walker (2 wheels) Gait Pattern/deviations: Step-through pattern (emerging)       General Gait Details: Cues for gait sequence and optimal step length, as well as RW progression to accomodate longer steps/strides   Stairs             Wheelchair Mobility     Tilt Bed    Modified Rankin (Stroke Patients Only)       Balance     Sitting balance-Leahy Scale: Fair       Standing balance-Leahy Scale: Poor (approaching Fair)                              Communication Communication Communication: No apparent difficulties  Cognition Arousal: Alert Behavior During Therapy: WFL for tasks assessed/performed   PT - Cognitive impairments: No apparent impairments                         Following commands: Intact      Cueing Cueing Techniques: Verbal cues  Exercises Total Joint Exercises Quad Sets: AROM, Both, 10 reps, Seated    General Comments General comments (skin integrity, edema, etc.): Feeling more sore overall today, but walking seems to have helped; bolstered seat height of recliner with folded blankets      Pertinent Vitals/Pain Pain Assessment Pain Assessment: 0-10 Pain Score: 6  Pain Location: R hip Pain Descriptors / Indicators: Grimacing, Guarding Pain Intervention(s): Monitored during session    Home Living  Prior Function            PT Goals (current goals can now be found in the care plan section) Acute Rehab PT Goals Patient Stated Goal: get to rehab to be independent to go home PT Goal Formulation: With patient Time For Goal Achievement: 04/08/24 Potential to Achieve Goals: Good Progress towards PT goals: Progressing toward goals    Frequency    Min 3X/week      PT Plan      Co-evaluation              AM-PAC PT 6 Clicks Mobility   Outcome Measure  Help needed turning from your back to your side while in a flat bed without  using bedrails?: A Little Help needed moving from lying on your back to sitting on the side of a flat bed without using bedrails?: A Little Help needed moving to and from a bed to a chair (including a wheelchair)?: A Little Help needed standing up from a chair using your arms (e.g., wheelchair or bedside chair)?: A Little Help needed to walk in hospital room?: A Little Help needed climbing 3-5 steps with a railing? : A Lot 6 Click Score: 17    End of Session Equipment Utilized During Treatment: Gait belt Activity Tolerance: Patient tolerated treatment well Patient left: in chair;with call bell/phone within reach;with chair alarm set Nurse Communication: Mobility status PT Visit Diagnosis: Unsteadiness on feet (R26.81)     Time: 1420-1450 PT Time Calculation (min) (ACUTE ONLY): 30 min  Charges:    $Gait Training: 23-37 mins PT General Charges $$ ACUTE PT VISIT: 1 Visit                     Daniel [name], PT  Acute Rehabilitation Services Office [phone] Secure Chat welcomed    Daniel VEAR [name] 03/26/2024, 4:05 PM

## 2024-03-26 NOTE — NC FL2 (Signed)
 [place]  MEDICAID FL2 LEVEL OF CARE FORM     IDENTIFICATION  Patient Name: Daniel [name] Birthdate: 08-10-1932 Sex: male Admission Date (Current Location): 03/23/2024  [facility] and [place] Number:  Producer, television/film/video and Address:  [facility], [address], [place], [place] [address]      Provider Number: [id number]  Attending Physician Name and Address:  Arlice Reichert, MD  Relative Name and Phone Number:       Current Level of Care: Hospital Recommended Level of Care: Skilled Nursing Facility Prior Approval Number:    Date Approved/Denied:   PASRR Number: pending  Discharge Plan: SNF    Current Diagnoses: Patient Active Problem List   Diagnosis Date Noted   Closed nondisplaced intertrochanteric fracture of right femur (HCC) 03/24/2024   Pedal edema 09/03/2023   Abnormal weight loss 03/12/2023   Nodule of left anterior chest wall 03/12/2023   Myalgia due to statin 05/22/2021   Branch retinal vein occlusion of left eye with macular edema (HCC) 09/16/2020   Chronic pain of right knee 03/19/2020   Hypertensive retinopathy of both eyes 11/13/2017   Stenosis of cervical spine with myelopathy (HCC) 07/14/2017   Pseudophakia of both eyes 05/24/2017   Stable branch retinal vein occlusion of right eye (HCC) 05/24/2017   DOE (dyspnea on exertion) 03/10/2017   Acute maxillary sinusitis 02/15/2017   Allergic rhinitis 02/15/2017   Preoperative cardiovascular examination 08/16/2016   Chronic pain of both knees 07/13/2016   Fatigue due to treatment 09/11/2015   Unstable angina (HCC) 08/06/2015   Low TSH level 08/13/2014   Orthostatic hypotension 12/17/2013   Pseudoptosis 05/22/2013   Dermatochalasis 02/21/2013   Epiphora 02/21/2013   S/P CABG x 3    Labile essential hypertension    Hyperlipidemia with target low density lipoprotein (LDL) cholesterol less than 55 mg/dL    Heart palpitations    Nuclear cataract 01/26/2012   PCO (posterior  capsular opacification) 01/26/2012   Pseudophakia 01/26/2012   LM-CAD:  LIMA-LAD, patent SVG-D2 & SVG-OM2 (DES PCI) , BMS PCI rPDA 07/27/1995    Orientation RESPIRATION BLADDER Height & Weight     Self, Time, Situation, Place  Normal Continent Weight: 86.6 kg Height:  6' 3 (190.5 cm)  BEHAVIORAL SYMPTOMS/MOOD NEUROLOGICAL BOWEL NUTRITION STATUS      Continent Diet (refer to d/c summary)  AMBULATORY STATUS COMMUNICATION OF NEEDS Skin   Extensive Assist Verbally Normal (S/p ORIF of right intertrochanteric hip fracture 10/18)                       Personal Care Assistance Level of Assistance  Bathing, Feeding, Dressing Bathing Assistance: Maximum assistance Feeding assistance: Independent Dressing Assistance: Maximum assistance     Functional Limitations Info  Sight, Hearing, Speech Sight Info: Adequate Hearing Info: Adequate Speech Info: Adequate    SPECIAL CARE FACTORS FREQUENCY  PT (By licensed PT), OT (By licensed OT)     PT Frequency: 5x/week, evaluate and treat OT Frequency: 5x/week, evaluate and treat            Contractures Contractures Info: Not present    Additional Factors Info  Code Status, Allergies Code Status Info: Full Code Allergies Info: Penicillins, Statins, Sulfa Antibiotics, Duloxetine Hcl           Current Medications (03/26/2024):  This is the current hospital active medication list Current Facility-Administered Medications  Medication Dose Route Frequency Provider Last Rate Last Admin   acetaminophen  (TYLENOL ) tablet  1,000 mg  1,000 mg Oral TID Arlice Reichert, MD   1,000 mg at 03/26/24 1107   alum & mag hydroxide-simeth (MAALOX/MYLANTA) 200-200-20 MG/5ML suspension 30 mL  30 mL Oral Q4H PRN Vernetta Lonni GRADE, MD       clopidogrel  (PLAVIX ) tablet 75 mg  75 mg Oral Daily Vernetta Lonni GRADE, MD   75 mg at 03/26/24 9147   docusate sodium  (COLACE) capsule 100 mg  100 mg Oral BID Vernetta Lonni GRADE, MD   100 mg at 03/26/24  9147   feeding supplement (ENSURE PLUS HIGH PROTEIN) liquid 237 mL  237 mL Oral BID BM Georgina Basket, MD   237 mL at 03/25/24 1109   HYDROmorphone (DILAUDID) injection 0.5-1 mg  0.5-1 mg Intravenous Q4H PRN Vernetta Lonni GRADE, MD   1 mg at 03/24/24 1830   menthol  (CEPACOL) lozenge 3 mg  1 lozenge Oral PRN Vernetta Lonni GRADE, MD       Or   phenol (CHLORASEPTIC) mouth spray 1 spray  1 spray Mouth/Throat PRN Vernetta Lonni GRADE, MD       methocarbamol  (ROBAXIN ) tablet 500 mg  500 mg Oral Q6H PRN Vernetta Lonni GRADE, MD   500 mg at 03/26/24 9561   Or   methocarbamol  (ROBAXIN ) injection 500 mg  500 mg Intravenous Q6H PRN Vernetta Lonni GRADE, MD       metoCLOPramide (REGLAN) tablet 5-10 mg  5-10 mg Oral Q8H PRN Vernetta Lonni GRADE, MD       Or   metoCLOPramide (REGLAN) injection 5-10 mg  5-10 mg Intravenous Q8H PRN Vernetta Lonni GRADE, MD       metoprolol  tartrate (LOPRESSOR ) tablet 25 mg  25 mg Oral q AM Vernetta Lonni GRADE, MD   25 mg at 03/26/24 0606   And   metoprolol  tartrate (LOPRESSOR ) tablet 75 mg  75 mg Oral Q24H Vernetta Lonni GRADE, MD   75 mg at 03/25/24 1949   ondansetron  (ZOFRAN ) tablet 4 mg  4 mg Oral Q6H PRN Vernetta Lonni GRADE, MD       Or   ondansetron  (ZOFRAN ) injection 4 mg  4 mg Intravenous Q6H PRN Vernetta Lonni GRADE, MD       oxyCODONE  (Oxy IR/ROXICODONE ) immediate release tablet 5-10 mg  5-10 mg Oral Q4H PRN Vernetta Lonni GRADE, MD   10 mg at 03/25/24 9078   pantoprazole  (PROTONIX ) EC tablet 40 mg  40 mg Oral Daily Vernetta Lonni GRADE, MD   40 mg at 03/26/24 9147   tamsulosin  (FLOMAX ) capsule 0.4 mg  0.4 mg Oral QHS Vernetta Lonni GRADE, MD   0.4 mg at 03/25/24 2122     Discharge Medications: Please see discharge summary for a list of discharge medications.  Relevant Imaging Results:  Relevant Lab Results:   Additional Information dd#[id number]  Rosalva Jon Bloch, RN

## 2024-03-26 NOTE — Progress Notes (Signed)
 PROGRESS NOTE  Daniel [name]  DOB: 11/26/1932  PCP: Shepard Ade, MD FMW:[id number]  DOA: 03/23/2024  LOS: 2 days  Hospital Day: 4  Subjective: Patient was seen and examined this morning.   Sitting up in recliner.  Not in distress.  Son at bedside. He showed me the recent blood work from his PCPs office with [employer].  Creatinine was 1.4 on 9/15.  Patient had low-grade temperature on 100.1 yesterday afternoon.  No recurrence since then. Blood pressure in 130s this morning Lab from this morning with WBC count improved to 8.8, hemoglobin down to 8.6, BUN/creatinine 32/1.48,  Brief narrative: Daniel [name] is a 89 y.o. male with PMH significant for HTN, HLD, CAD (s/p CABG in 1997; PCI to RCA in 11/2006; PCI to OM in 04/2022), cervical spinal stenosis, 10/17, patient was brought to the ED after ground-level fall. Per report, patient fell in the garage after tripping over the lawnmower.  He sustained immediate right hip pain and was unable to get up or put any weight on it.  Also sustained an injury to the arm, with skin being peeled back from hitting the sweeper. Neighbor called EMS and patient was brought to the ED  In the ED, patient was tachycardic, hypertensive Labs showed creatinine elevated 1.42, WBC count 13.8 CT right hip showed nondisplaced comminuted fracture of the right greater trochanter  MRI right hip confirmed the same finding. Orthopedics was consulted Admitted to [facility] 10/8, underwent ORIF by Dr. Vernetta  Assessment and plan: Acute nondisplaced comminuted fracture of the right greater trochanter S/p ORIF -10/18 Dr. Vernetta Secondary to mechanical fall Imaging and procedure as above PT eval pending Bowel regimen:Colace 100 mg twice daily.  Last BM yesterday DVT prophylaxis:SCDs Pain regimen --- Scheduled: Tylenol  1 g 3 times daily --- PRN: oxycodone ,, Robaxin , IV Dilaudid  CKD 3 A Today, patient showed me the recent blood work from his  PCPs office with [employer].  Creatinine was 1.4 on 9/15. So, it seems his current creatinine is at baseline. AKI ruled out.  Adequately hydrated. Encourage oral hydration.  Recent Labs    03/23/24 1943 03/25/24 0741 03/26/24 0720  BUN 27* 27* 32*  CREATININE 1.42* 1.49* 1.48*  CO2 25 23 24    Acute blood loss anemia Noted drop in hemoglobin due to fracture and surgery.  8.6 today.  No other source of of bleeding. Continue to monitor.  Transfuse if less than 8 given his significant cardiac history Continue Protonix  Recent Labs    03/23/24 1943 03/25/24 0741 03/26/24 0720  HGB 12.3* 9.7* 8.6*  MCV 93.0 93.3 93.5   Thrombocytopenia Platelet gradually downtrending.  Not on heparin  products.  Continue SCDs Recent Labs  Lab 03/23/24 1943 03/25/24 0741 03/26/24 0720  PLT 214 145* 134*    H/o CAD s/p CABG in 1997; PCI to RCA in 11/2006; PCI to OM in 04/2022 PTA meds- Plavix  75mg  daily Plavix  was resumed on 10/19   HTN PTA meds- metoprolol  tartrate twice daily 25 mg-75 mg Continue the same.   BPH Continue Flomax   Impaired mobility  H/o cervical spinal stenosis Seen by PT.  SNF recommended    Mobility:  PT Orders: Active   PT Follow up Rec: Skilled Nursing-Short Term Rehab (<3 Hours/Day)03/25/2024 1400   Goals of care   Code Status: Full Code     DVT prophylaxis:  SCDs Start: 03/24/24 1447 SCDs Start: 03/24/24 0800   Antimicrobials: None Fluid: Stop fluid today Consultants: Orthopedics Family Communication:  Son at bedside  Status: Inpatient Level of care:  Med-Surg   Patient is from: Home Needs to continue in-hospital care: Ongoing postop care Anticipated d/c to: SNF recommended by PT    Diet:  Diet Order             Diet regular Room service appropriate? Yes; Fluid consistency: Thin  Diet effective now                   Scheduled Meds:  acetaminophen   1,000 mg Oral TID   clopidogrel   75 mg Oral Daily   docusate  sodium  100 mg Oral BID   feeding supplement  237 mL Oral BID BM   metoprolol  tartrate  25 mg Oral q AM   And   metoprolol  tartrate  75 mg Oral Q24H   pantoprazole   40 mg Oral Daily   tamsulosin   0.4 mg Oral QHS    PRN meds: alum & mag hydroxide-simeth, HYDROmorphone (DILAUDID) injection, menthol  **OR** phenol, methocarbamol  **OR** methocarbamol  (ROBAXIN ) injection, metoCLOPramide **OR** metoCLOPramide (REGLAN) injection, ondansetron  **OR** ondansetron  (ZOFRAN ) IV, oxyCODONE    Infusions:     Antimicrobials: Anti-infectives (From admission, onward)    Start     Dose/Rate Route Frequency Ordered Stop   03/24/24 1800  ceFAZolin  (ANCEF ) IVPB 2g/100 mL premix        2 g 200 mL/hr over 30 Minutes Intravenous Every 6 hours 03/24/24 1446 03/25/24 0151   03/24/24 1130  ceFAZolin  (ANCEF ) IVPB 2g/100 mL premix        2 g 200 mL/hr over 30 Minutes Intravenous On call to O.R. 03/24/24 1124 03/24/24 1255       Objective: Vitals:   03/26/24 0434 03/26/24 0856  BP: (!) 119/56 134/62  Pulse: 85 77  Resp: 16 16  Temp: 98.2 F (36.8 C) 98.1 F (36.7 C)  SpO2:  92%    Intake/Output Summary (Last 24 hours) at 03/26/2024 1326 Last data filed at 03/26/2024 1108 Gross per 24 hour  Intake 1609.13 ml  Output 1625 ml  Net -15.87 ml   Filed Weights   03/24/24 1219  Weight: 86.6 kg   Weight change:  Body mass index is 23.87 kg/m.   Physical Exam: General exam: Pleasant, elderly Caucasian male.  Not in distress Skin: No rashes, lesions or ulcers. HEENT: Atraumatic, normocephalic, no obvious bleeding Lungs: Clear to auscultation bilaterally,  CVS: S1, S2, no murmur,   GI/Abd: Soft, nontender, nondistended, bowel sound present,   CNS: Alert, awake, oriented x 3 Psychiatry: Mood appropriate Extremities: No pedal edema, no calf tenderness, surgical site looks intact without bleeding.  Data Review: I have personally reviewed the laboratory data and studies available.  F/u labs  ordered Unresulted Labs (From admission, onward)    None       Signed, Chapman Rota, MD [facility] 03/26/2024

## 2024-03-27 DIAGNOSIS — S72144A Nondisplaced intertrochanteric fracture of right femur, initial encounter for closed fracture: Secondary | ICD-10-CM | POA: Diagnosis not present

## 2024-03-27 LAB — CBC
HCT: 26.7 % — ABNORMAL LOW (ref 39.0–52.0)
Hemoglobin: 8.8 g/dL — ABNORMAL LOW (ref 13.0–17.0)
MCH: 31 pg (ref 26.0–34.0)
MCHC: 33 g/dL (ref 30.0–36.0)
MCV: 94 fL (ref 80.0–100.0)
Platelets: 179 K/uL (ref 150–400)
RBC: 2.84 MIL/uL — ABNORMAL LOW (ref 4.22–5.81)
RDW: 13.8 % (ref 11.5–15.5)
WBC: 6.1 K/uL (ref 4.0–10.5)
nRBC: 0 % (ref 0.0–0.2)

## 2024-03-27 MED ORDER — METHOCARBAMOL 500 MG PO TABS: 500.0000 mg | ORAL_TABLET | Freq: Four times a day (QID) | ORAL | 0 refills | Status: DC | PRN

## 2024-03-27 MED ORDER — ZOLPIDEM TARTRATE 5 MG PO TABS
5.0000 mg | ORAL_TABLET | Freq: Every day | ORAL | Status: DC
  Administered 2024-03-28 (×2): 5 mg via ORAL
  Filled 2024-03-27 (×3): qty 1

## 2024-03-27 MED ORDER — TRAMADOL HCL 50 MG PO TABS
50.0000 mg | ORAL_TABLET | Freq: Four times a day (QID) | ORAL | Status: DC | PRN
  Administered 2024-03-27 – 2024-03-29 (×5): 100 mg via ORAL
  Filled 2024-03-27 (×6): qty 2

## 2024-03-27 MED ORDER — TRAMADOL HCL 50 MG PO TABS: 50.0000 mg | ORAL_TABLET | Freq: Four times a day (QID) | ORAL | 0 refills | Status: AC | PRN

## 2024-03-27 MED ORDER — POLYETHYLENE GLYCOL 3350 17 G PO PACK
17.0000 g | PACK | Freq: Every day | ORAL | Status: DC | PRN
  Administered 2024-03-27: 17 g via ORAL
  Filled 2024-03-27: qty 1

## 2024-03-27 NOTE — Progress Notes (Signed)
 PROGRESS NOTE  Daniel [name]  DOB: 12/14/1932  PCP: Shepard Ade, MD FMW:[id number]  DOA: 03/23/2024  LOS: 3 days  Hospital Day: 5  Subjective: Patient was seen and examined this morning. Sitting up at the edge of the bed.  Not in distress. Not getting good sleep at night.  Pain controlled Afebrile, blood pressure elevated to 150s to 170s in the last 24 hours Breathing on room air  Brief narrative: Daniel [name] is a 89 y.o. male with PMH significant for HTN, HLD, CAD (s/p CABG in 1997; PCI to RCA in 11/2006; PCI to OM in 04/2022), cervical spinal stenosis, 10/17, patient was brought to the ED after ground-level fall. Per report, patient fell in the garage after tripping over the lawnmower.  He sustained immediate right hip pain and was unable to get up or put any weight on it.  Also sustained an injury to the arm, with skin being peeled back from hitting the sweeper. Neighbor called EMS and patient was brought to the ED  In the ED, patient was tachycardic, hypertensive Labs showed creatinine elevated 1.42, WBC count 13.8 CT right hip showed nondisplaced comminuted fracture of the right greater trochanter  MRI right hip confirmed the same finding. Orthopedics was consulted Admitted to [facility] 10/8, underwent ORIF by Dr. Vernetta  Assessment and plan: Acute nondisplaced comminuted fracture of the right greater trochanter S/p ORIF -10/18 Dr. Vernetta Secondary to mechanical fall Imaging and procedure as above PT eval pending Bowel regimen:Colace 100 mg twice daily.  Last BM 10/19. Added MiraLAX  as needed.  DVT prophylaxis:SCDs Pain regimen --- Scheduled: Tylenol  1 g 3 times daily --- PRN: oxycodone , Robaxin , IV Dilaudid  CKD 3 A Today, patient showed me the recent blood work from his PCPs office with [employer].  Creatinine was 1.4 on 9/15. So, it seems his current creatinine is at baseline. AKI ruled out.  Adequately hydrated. Encourage oral  hydration.  Recent Labs    03/23/24 1943 03/25/24 0741 03/26/24 0720  BUN 27* 27* 32*  CREATININE 1.42* 1.49* 1.48*  CO2 25 23 24    Acute blood loss anemia Noted drop in hemoglobin due to fracture and surgery.   Hemoglobin this morning stable at 8.8. no other source of of bleeding. Continue to monitor.  Transfuse if less than 8 given his significant cardiac history Continue Protonix  Recent Labs    03/23/24 1943 03/25/24 0741 03/26/24 0720 03/27/24 1034  HGB 12.3* 9.7* 8.6* 8.8*  MCV 93.0 93.3 93.5 94.0   Thrombocytopenia Platelet level better today. Recent Labs  Lab 03/23/24 1943 03/25/24 0741 03/26/24 0720 03/27/24 1034  PLT 214 145* 134* 179    H/o CAD s/p CABG in 1997; PCI to RCA in 11/2006; PCI to OM in 04/2022 PTA meds- Plavix  75mg  daily Plavix  was resumed on 10/19   HTN PTA meds- metoprolol  tartrate twice daily 25 mg-75 mg Currently continued on the same.  Blood pressure running elevated overnight.  If continues to run elevated, will add another agent probably tomorrow.   BPH Continue Flomax   Impaired mobility  H/o cervical spinal stenosis Seen by PT.  SNF recommended  Insomnia Resume Ambien  for tonight    Mobility:  PT Orders: Active   PT Follow up Rec: Skilled Nursing-Short Term Rehab (<3 Hours/Day)03/26/2024 1600   Goals of care   Code Status: Full Code     DVT prophylaxis:  SCDs Start: 03/24/24 1447 SCDs Start: 03/24/24 0800   Antimicrobials: None Fluid: Not on IV fluid Consultants:  Orthopedics Family Communication: Son at bedside  Status: Inpatient Level of care:  Med-Surg   Patient is from: Home Needs to continue in-hospital care: Medically stable for discharge Anticipated d/c to: SNF recommended by PT    Diet:  Diet Order             Diet regular Room service appropriate? Yes; Fluid consistency: Thin  Diet effective now                   Scheduled Meds:  acetaminophen   1,000 mg Oral TID   clopidogrel   75  mg Oral Daily   docusate sodium   100 mg Oral BID   feeding supplement  237 mL Oral BID BM   metoprolol  tartrate  25 mg Oral q AM   And   metoprolol  tartrate  75 mg Oral Q24H   pantoprazole   40 mg Oral Daily   tamsulosin   0.4 mg Oral QHS   zolpidem   5 mg Oral QHS    PRN meds: alum & mag hydroxide-simeth, menthol  **OR** phenol, methocarbamol  **OR** methocarbamol  (ROBAXIN ) injection, metoCLOPramide **OR** metoCLOPramide (REGLAN) injection, ondansetron  **OR** ondansetron  (ZOFRAN ) IV, polyethylene glycol, traMADol    Infusions:     Antimicrobials: Anti-infectives (From admission, onward)    Start     Dose/Rate Route Frequency Ordered Stop   03/24/24 1800  ceFAZolin  (ANCEF ) IVPB 2g/100 mL premix        2 g 200 mL/hr over 30 Minutes Intravenous Every 6 hours 03/24/24 1446 03/25/24 0151   03/24/24 1130  ceFAZolin  (ANCEF ) IVPB 2g/100 mL premix        2 g 200 mL/hr over 30 Minutes Intravenous On call to O.R. 03/24/24 1124 03/24/24 1255       Objective: Vitals:   03/27/24 0738 03/27/24 0931  BP: (!) 173/73 (!) 157/67  Pulse: 97 86  Resp: 16 16  Temp: (!) 97.5 F (36.4 C) 98 F (36.7 C)  SpO2: (!) 80% 96%    Intake/Output Summary (Last 24 hours) at 03/27/2024 1432 Last data filed at 03/27/2024 0700 Gross per 24 hour  Intake 460 ml  Output 600 ml  Net -140 ml   Filed Weights   03/24/24 1219  Weight: 86.6 kg   Weight change:  Body mass index is 23.87 kg/m.   Physical Exam: General exam: Pleasant, elderly Caucasian male.  Not in distress.  Pain control is adequate Skin: No rashes, lesions or ulcers. HEENT: Atraumatic, normocephalic, no obvious bleeding Lungs: Clear to auscultation bilaterally,  CVS: S1, S2, no murmur,   GI/Abd: Soft, nontender, nondistended, bowel sound present,   CNS: Alert, awake, oriented x 3 Psychiatry: Mood appropriate Extremities: No pedal edema, no calf tenderness, surgical site looks intact without bleeding.  Data Review: I have  personally reviewed the laboratory data and studies available.  F/u labs ordered Unresulted Labs (From admission, onward)    None       Signed, Chapman Rota, MD [facility] 03/27/2024

## 2024-03-27 NOTE — Progress Notes (Signed)
 Pt c/o constipation and would like Miralax  added to [facility] daily in am. Pt currently on BID colace and states he uses miralax  at home.

## 2024-03-27 NOTE — Care Management Important Message (Signed)
 Important Message  Patient Details  Name: Daniel [name] MRN: [id number] Date of Birth: [birth year]   Important Message Given:  Yes - Medicare IM     Jon Cruel 03/27/2024, 4:20 PM

## 2024-03-27 NOTE — TOC Progression Note (Addendum)
 Transition of Care [facility]) - Progression Note    Patient Details  Name: Daniel [name] MRN: [id number] Date of Birth: Jun 11, 1932  Transition of Care [facility]) CM/SW Contact  Bridget Cordella Simmonds, LCSW Phone Number: 03/27/2024, 12:05 PM  Clinical Narrative:  CSW spoke with pt and son Rolan regarding SNF bed offers.  Per pt, he has arranged STR with [facility].  CSW spoke with Katie/[facility].  She confirmed that she anticipates making bed offer, waiting on approval from her DON.   1300: TC Katie: she can make bed offer.  SNF auth request submitted in [place].   Expected Discharge Plan: Skilled Nursing Facility Barriers to Discharge: Continued Medical Work up               Expected Discharge Plan and Services       Living arrangements for the past 2 months: Single Family Home                                       Social Drivers of Health (SDOH) Interventions SDOH Screenings   Food Insecurity: No Food Insecurity (03/24/2024)  Housing: Low Risk  (03/24/2024)  Transportation Needs: No Transportation Needs (03/24/2024)  Utilities: Not At Risk (03/24/2024)  Social Connections: Moderately Isolated (03/24/2024)  Tobacco Use: Low Risk  (03/24/2024)    Readmission Risk Interventions     No data to display

## 2024-03-27 NOTE — Progress Notes (Signed)
 Subjective: 3 Days Post-Op Procedure(s) (LRB): FIXATION, FRACTURE, INTERTROCHANTERIC, WITH INTRAMEDULLARY ROD (Right) Patient reports pain as moderate.  He has been mobilizing well.  He is sitting up at the bedside in a chair and his son is in the room.  Apparently he will be discharged tomorrow to short-term skilled nursing.  Yesterday's total administered Morphine  Milligram Equivalents: 31.5  Today's  total administered Morphine  Milligram Equivalents: 27.5   Objective: Vital signs in last 24 hours: Temp:  [97.5 F (36.4 C)-98.1 F (36.7 C)] 98 F (36.7 C) (10/21 0931) Pulse Rate:  [80-97] 86 (10/21 0931) Resp:  [16-20] 16 (10/21 0931) BP: (157-173)/(67-73) 157/67 (10/21 0931) SpO2:  [80 %-100 %] 96 % (10/21 0931)  Intake/Output from previous day: 10/20 0701 - 10/21 0700 In: 938.7 [P.O.:700; I.V.:238.7] Out: 925 [Urine:925] Intake/Output this shift: No intake/output data recorded.  Recent Labs    03/25/24 0741 03/26/24 0720 03/27/24 1034  HGB 9.7* 8.6* 8.8*   Recent Labs    03/26/24 0720 03/27/24 1034  WBC 8.8 6.1  RBC 2.79* 2.84*  HCT 26.1* 26.7*  PLT 134* 179   Recent Labs    03/25/24 0741 03/26/24 0720  NA 137 137  K 4.4 4.5  CL 105 107  CO2 23 24  BUN 27* 32*  CREATININE 1.49* 1.48*  GLUCOSE 123* 115*  CALCIUM  8.3* 8.3*   No results for input(s): LABPT, INR in the last 72 hours.  Sensation intact distally Intact pulses distally Dorsiflexion/Plantar flexion intact Incision: dressing C/D/I   Assessment/Plan: 3 Days Post-Op Procedure(s) (LRB): FIXATION, FRACTURE, INTERTROCHANTERIC, WITH INTRAMEDULLARY ROD (Right) Up with therapy Discharge to SNF tomorrow.      Lonni CINDERELLA Poli 03/27/2024, 2:31 PM

## 2024-03-27 NOTE — Plan of Care (Signed)

## 2024-03-27 NOTE — Progress Notes (Signed)
 Physical Therapy Treatment Patient Details Name: Daniel [name] MRN: [id number] DOB: 04-Sep-1932 Today's Date: 03/27/2024   History of Present Illness CASIN FEDERICI is a 89 y.o. male presents with ground level fall in garage, sustaining R hip fracture, in ED noting AKI as well; S/p Open reduction/internal fixation of right intertrochanteric hip fracture (WBAT) on 10/18; with medical history significant of CAD (s/p CABG in 1997; PCI to RCA in 11/2006; PCI to OM in 04/2022), HTN, and HLD    PT Comments  Pt seated up EOB on arrival, pleasant and eager for mobility and demonstrating continued progress towards acute goals. Pt demonstrating increased activity tolerance this session, [progressing gait distance with RW for support and grossly CGA for safety with pt demonstrating more symmetrical gait pattern, however continues to benefit from cues for upright posture and forward gaze. Pt able to transfer sit<>stand with up to min A this session. Pt continues to be limited in safe mobility by decreased activity tolerance, poor balance/postural reactions and pain and will benefit from continued inpatient follow up therapy, <3 hours/day. Pt continues to benefit from skilled PT services to progress toward functional mobility goals.     If plan is discharge home, recommend the following: A little help with walking and/or transfers;A little help with bathing/dressing/bathroom;Assist for transportation   Can travel by private vehicle     Yes  Equipment Recommendations  Rolling walker (2 wheels);BSC/3in1    Recommendations for Other Services       Precautions / Restrictions Precautions Precautions: Fall Precaution/Restrictions Comments: Fall risk is present, and minized with use of RW Restrictions Weight Bearing Restrictions Per Provider Order: Yes RLE Weight Bearing Per Provider Order: Weight bearing as tolerated     Mobility  Bed Mobility Overal bed mobility: Needs Assistance              General bed mobility comments: EOB upon arriva    Transfers Overall transfer level: Needs assistance Equipment used: Rolling walker (2 wheels) Transfers: Sit to/from Stand Sit to Stand: Min assist, Contact guard assist           General transfer comment: min A to stand from EOB, CGA to stand from elevated BSC over commode    Ambulation/Gait Ambulation/Gait assistance: Contact guard assist Gait Distance (Feet): 60 Feet (x2 + 15') Assistive device: Rolling walker (2 wheels) Gait Pattern/deviations: Step-through pattern (emerging) Gait velocity: slowed     General Gait Details: Cues for gait sequence and optimal step length, as well as RW progression to accomodate longer steps/strides, cues for wider BOS   Stairs             Wheelchair Mobility     Tilt Bed    Modified Rankin (Stroke Patients Only)       Balance Overall balance assessment: Needs assistance Sitting-balance support: Feet supported Sitting balance-Leahy Scale: Fair     Standing balance support: Bilateral upper extremity supported, During functional activity Standing balance-Leahy Scale: Poor Standing balance comment: reliant on RW                            Communication Communication Communication: No apparent difficulties  Cognition Arousal: Alert Behavior During Therapy: WFL for tasks assessed/performed   PT - Cognitive impairments: No apparent impairments                         Following commands: Intact      Cueing Cueing  Techniques: Verbal cues  Exercises      General Comments General comments (skin integrity, edema, etc.): pt son present and supportive      Pertinent Vitals/Pain Pain Assessment Pain Assessment: Faces Faces Pain Scale: Hurts little more Pain Location: R hip Pain Descriptors / Indicators: Grimacing, Guarding Pain Intervention(s): Monitored during session, Limited activity within patient's tolerance    Home Living                           Prior Function            PT Goals (current goals can now be found in the care plan section) Acute Rehab PT Goals Patient Stated Goal: get to rehab to be independent to go home PT Goal Formulation: With patient Time For Goal Achievement: 04/08/24 Progress towards PT goals: Progressing toward goals    Frequency    Min 3X/week      PT Plan      Co-evaluation              AM-PAC PT 6 Clicks Mobility   Outcome Measure  Help needed turning from your back to your side while in a flat bed without using bedrails?: A Little Help needed moving from lying on your back to sitting on the side of a flat bed without using bedrails?: A Little Help needed moving to and from a bed to a chair (including a wheelchair)?: A Little Help needed standing up from a chair using your arms (e.g., wheelchair or bedside chair)?: A Little Help needed to walk in hospital room?: A Little Help needed climbing 3-5 steps with a railing? : A Lot 6 Click Score: 17    End of Session Equipment Utilized During Treatment: Gait belt Activity Tolerance: Patient tolerated treatment well Patient left: in chair;with call bell/phone within reach;with nursing/sitter in room;with family/visitor present Nurse Communication: Mobility status PT Visit Diagnosis: Unsteadiness on feet (R26.81)     Time: 8675-8651 PT Time Calculation (min) (ACUTE ONLY): 24 min  Charges:    $Gait Training: 23-37 mins PT General Charges $$ ACUTE PT VISIT: 1 Visit                     Therisa R. PTA Acute Rehabilitation Services Office: [phone]   Therisa CHRISTELLA Boor 03/27/2024, 4:50 PM

## 2024-03-28 DIAGNOSIS — S72144A Nondisplaced intertrochanteric fracture of right femur, initial encounter for closed fracture: Secondary | ICD-10-CM | POA: Diagnosis not present

## 2024-03-28 MED ORDER — PANTOPRAZOLE SODIUM 40 MG PO TBEC: 40.0000 mg | DELAYED_RELEASE_TABLET | Freq: Every day | ORAL | Status: DC

## 2024-03-28 MED ORDER — HYDROMORPHONE HCL 1 MG/ML IJ SOLN
0.5000 mg | Freq: Once | INTRAMUSCULAR | Status: AC
  Administered 2024-03-29: 0.5 mg via INTRAVENOUS
  Filled 2024-03-28 (×2): qty 0.5

## 2024-03-28 MED ORDER — DOCUSATE SODIUM 100 MG PO CAPS: 100.0000 mg | ORAL_CAPSULE | Freq: Two times a day (BID) | ORAL | Status: AC

## 2024-03-28 MED ORDER — ALUM & MAG HYDROXIDE-SIMETH 200-200-20 MG/5ML PO SUSP: 30.0000 mL | ORAL | Status: AC | PRN

## 2024-03-28 MED ORDER — POLYETHYLENE GLYCOL 3350 17 G PO PACK: 17.0000 g | PACK | Freq: Every day | ORAL | Status: AC | PRN

## 2024-03-28 MED ORDER — AMLODIPINE BESYLATE 2.5 MG PO TABS
2.5000 mg | ORAL_TABLET | Freq: Every day | ORAL | Status: DC
  Filled 2024-03-28: qty 1

## 2024-03-28 MED ORDER — ENSURE PLUS HIGH PROTEIN PO LIQD: 237.0000 mL | Freq: Two times a day (BID) | ORAL | Status: AC

## 2024-03-28 MED ORDER — ZOLPIDEM TARTRATE 10 MG PO TABS: 10.0000 mg | ORAL_TABLET | Freq: Every day | ORAL | 0 refills | Status: AC

## 2024-03-28 NOTE — TOC Progression Note (Addendum)
 Transition of Care [facility]) - Progression Note    Patient Details  Name: Daniel [name] MRN: [id number] Date of Birth: May 13, 1933  Transition [facility] Care [facility]) CM/SW Contact  Bridget Cordella Simmonds, LCSW Phone Number: 03/28/2024, 8:54 AM  Clinical Narrative:   SNF auth request remains pending at this time.   1100: TC Navi: [facility] is out of network.  Pt informed and is interested in [facility] or [place], finally decides on [place]. Brittany/[facility] confirms can receive pt tomorrow.  Katie/[facility] updated.   SNF auth request facility choice updated, auth approved: [id number], 3 days: 10/22-10/24.   Expected Discharge Plan: Skilled Nursing Facility Barriers to Discharge: Continued Medical Work up               Expected Discharge Plan and Services       Living arrangements for the past 2 months: Single Family Home                                       Social Drivers of Health (SDOH) Interventions SDOH Screenings   Food Insecurity: No Food Insecurity (03/24/2024)  Housing: Low Risk  (03/24/2024)  Transportation Needs: No Transportation Needs (03/24/2024)  Utilities: Not At Risk (03/24/2024)  Social Connections: Moderately Isolated (03/24/2024)  Tobacco Use: Low Risk  (03/24/2024)    Readmission Risk Interventions     No data to display

## 2024-03-28 NOTE — Progress Notes (Signed)
 Occupational Therapy Treatment Patient Details Name: Daniel [name] MRN: [id number] DOB: 1933/04/11 Today's Date: 03/28/2024   History of present illness Daniel [name] is a 89 y.o. male presents with ground level fall in garage, sustaining R hip fracture, in ED noting AKI as well; S/p Open reduction/internal fixation of right intertrochanteric hip fracture (WBAT) on 10/18; with medical history significant of CAD (s/p CABG in 1997; PCI to RCA in 11/2006; PCI to OM in 04/2022), HTN, and HLD   OT comments  Patient sitting upright in bedside chair and agreeable to participate in OT treatment session.  Patient completed functional mobility with RW with CGA and completed toileting transfer with CGA and minA/CGA for clothing mgmt along with functional in room wit RW and CGA.  Patient returned to bedside chair with CGA with verbal cues for reaching back for armrests of chair.  Patient would benefit from additional OT intervention to address functional deficits in order for patient to return to PLOF.      If plan is discharge home, recommend the following:  A little help with walking and/or transfers;Assistance with cooking/housework;Assist for transportation;Help with stairs or ramp for entrance;A lot of help with bathing/dressing/bathroom   Equipment Recommendations       Recommendations for Other Services      Precautions / Restrictions Precautions Precautions: Fall Restrictions Weight Bearing Restrictions Per Provider Order: Yes RLE Weight Bearing Per Provider Order: Weight bearing as tolerated       Mobility Bed Mobility                    Transfers Overall transfer level: Needs assistance Equipment used: Rolling walker (2 wheels) Transfers: Sit to/from Stand Sit to Stand: Min assist, Contact guard assist                 Balance Overall balance assessment: Needs assistance Sitting-balance support: Feet supported                                        ADL either performed or assessed with clinical judgement   ADL Overall ADL's : Needs assistance/impaired                         Toilet Transfer: Contact guard assist;BSC/3in1;Regular Toilet (BSC placed over commode in bathroom for elevated height)   Toileting- Clothing Manipulation and Hygiene: Minimal assistance       Functional mobility during ADLs: Contact guard assist;Rolling walker (2 wheels)      Extremity/Trunk Assessment              Vision       Perception     Praxis     Communication     Cognition Arousal: Alert Behavior During Therapy: WFL for tasks assessed/performed Cognition: No apparent impairments                               Following commands: Intact        Cueing      Exercises      Shoulder Instructions       General Comments      Pertinent Vitals/ Pain       Pain Assessment Pain Assessment: 0-10 Pain Score: 4  Faces Pain Scale: Hurts little more Pain Location: R hip Pain Intervention(s): Monitored during session  Home Living                                          Prior Functioning/Environment              Frequency  Min 2X/week        Progress Toward Goals  OT Goals(current goals can now be found in the care plan section)  Progress towards OT goals: Progressing toward goals  Acute Rehab OT Goals OT Goal Formulation: With patient Time For Goal Achievement: 04/08/24 Potential to Achieve Goals: Good ADL Goals Pt Will Perform Upper Body Bathing: Independently Pt Will Perform Lower Body Bathing: with set-up;with modified independence Pt Will Perform Upper Body Dressing: Independently Pt Will Perform Lower Body Dressing: with adaptive equipment;with set-up Pt Will Perform Toileting - Clothing Manipulation and hygiene: with contact guard assist  Plan      Co-evaluation                 AM-PAC OT 6 Clicks Daily Activity     Outcome Measure   Help from  another person eating meals?: None Help from another person taking care of personal grooming?: A Little Help from another person toileting, which includes using toliet, bedpan, or urinal?: A Little Help from another person bathing (including washing, rinsing, drying)?: A Little Help from another person to put on and taking off regular upper body clothing?: None Help from another person to put on and taking off regular lower body clothing?: A Lot 6 Click Score: 19    End of Session Equipment Utilized During Treatment: Rolling walker (2 wheels)  OT Visit Diagnosis: Unsteadiness on feet (R26.81);History of falling (Z91.81)   Activity Tolerance Patient tolerated treatment well   Patient Left with call bell/phone within reach   Nurse Communication Mobility status        Time: 8574-8557 OT Time Calculation (min): 17 min  Charges: OT General Charges $OT Visit: 1 Visit OT Treatments $Self Care/Home Management : 8-22 mins  Daniel [name] OT/L Daniel [name] 03/28/2024, 4:43 PM

## 2024-03-28 NOTE — Progress Notes (Signed)
 PROGRESS NOTE  Daniel [name]  DOB: 1932/10/04  PCP: Shepard Ade, MD FMW:[id number]  DOA: 03/23/2024  LOS: 4 days  Hospital Day: 6  Subjective: Patient was seen and examined this morning. Sitting up in recliner.  Not in distress.  Complains of spasm at night.  Reminded him to ask for Robaxin  as needed Son at bedside. Afebrile, blood pressure in 140s, breathing on room air No labs today Pending SNF  Brief narrative: Daniel [name] is a 89 y.o. male with PMH significant for HTN, HLD, CAD (s/p CABG in 1997; PCI to RCA in 11/2006; PCI to OM in 04/2022), cervical spinal stenosis, 10/17, patient was brought to the ED after ground-level fall. Per report, patient fell in the garage after tripping over the lawnmower.  He sustained immediate right hip pain and was unable to get up or put any weight on it.  Also sustained an injury to the arm, with skin being peeled back from hitting the sweeper. Neighbor called EMS and patient was brought to the ED  In the ED, patient was tachycardic, hypertensive Labs showed creatinine elevated 1.42, WBC count 13.8 CT right hip showed nondisplaced comminuted fracture of the right greater trochanter  MRI right hip confirmed the same finding. Orthopedics was consulted Admitted to [facility] 10/8, underwent ORIF by Dr. Vernetta  Assessment and plan: Acute nondisplaced comminuted fracture of the right greater trochanter S/p ORIF -10/18 Dr. Vernetta Secondary to mechanical fall Imaging and procedure as above PT eval pending Bowel regimen:Colace 100 mg twice daily and MiraLAX  as needed.  Last BM yesterday DVT prophylaxis:SCDs Pain regimen --- Scheduled: Tylenol  1 g 3 times daily --- PRN: oxycodone , Robaxin , IV Dilaudid.   Reminded him to ask for Robaxin  at night for spasm  CKD 3 A Today, patient showed me the recent blood work from his PCPs office with [employer].  Creatinine was 1.4 on 9/15. So, it seems his current creatinine is at  baseline. AKI ruled out.  Adequately hydrated. Encourage oral hydration.  Recent Labs    03/23/24 1943 03/25/24 0741 03/26/24 0720  BUN 27* 27* 32*  CREATININE 1.42* 1.49* 1.48*  CO2 25 23 24    Acute blood loss anemia Noted drop in hemoglobin due to fracture and surgery.   Hemoglobin this morning stable at 8.8. no other source of of bleeding. Continue to monitor.  Transfuse if less than 8 given his significant cardiac history Continue Protonix  Recent Labs    03/23/24 1943 03/25/24 0741 03/26/24 0720 03/27/24 1034  HGB 12.3* 9.7* 8.6* 8.8*  MCV 93.0 93.3 93.5 94.0   Thrombocytopenia Platelet level better today. Recent Labs  Lab 03/23/24 1943 03/25/24 0741 03/26/24 0720 03/27/24 1034  PLT 214 145* 134* 179    H/o CAD s/p CABG in 1997; PCI to RCA in 11/2006; PCI to OM in 04/2022 PTA meds- Plavix  75mg  daily Plavix  was resumed on 10/19   HTN PTA meds- metoprolol  tartrate twice daily 25 mg-75 mg Currently continued on the same.  Blood pressure running elevated overnight.  I will add amlodipine 2.5 mg daily.   BPH Continue Flomax   Impaired mobility  H/o cervical spinal stenosis Seen by PT.  SNF recommended  Insomnia Ambien  nightly    Mobility:  PT Orders: Active   PT Follow up Rec: Skilled Nursing-Short Term Rehab (<3 Hours/Day)03/27/2024 1600   Goals of care   Code Status: Full Code     DVT prophylaxis:  SCDs Start: 03/24/24 1447 SCDs Start: 03/24/24 0800   Antimicrobials:  None Fluid: Not on IV fluid Consultants: Orthopedics Family Communication: Son at bedside  Status: Inpatient Level of care:  Med-Surg   Patient is from: Home Needs to continue in-hospital care: Medically stable for discharge.  It seems [facility] can take him tomorrow Anticipated d/c to: SNF recommended by PT    Diet:  Diet Order             Diet regular Room service appropriate? Yes; Fluid consistency: Thin  Diet effective now                   Scheduled  Meds:  acetaminophen   1,000 mg Oral TID   amLODipine  2.5 mg Oral Daily   clopidogrel   75 mg Oral Daily   docusate sodium   100 mg Oral BID   feeding supplement  237 mL Oral BID BM   metoprolol  tartrate  25 mg Oral q AM   And   metoprolol  tartrate  75 mg Oral Q24H   pantoprazole   40 mg Oral Daily   tamsulosin   0.4 mg Oral QHS   zolpidem   5 mg Oral QHS    PRN meds: alum & mag hydroxide-simeth, menthol  **OR** phenol, methocarbamol  **OR** methocarbamol  (ROBAXIN ) injection, metoCLOPramide **OR** metoCLOPramide (REGLAN) injection, ondansetron  **OR** ondansetron  (ZOFRAN ) IV, polyethylene glycol, traMADol    Infusions:     Antimicrobials: Anti-infectives (From admission, onward)    Start     Dose/Rate Route Frequency Ordered Stop   03/24/24 1800  ceFAZolin  (ANCEF ) IVPB 2g/100 mL premix        2 g 200 mL/hr over 30 Minutes Intravenous Every 6 hours 03/24/24 1446 03/25/24 0151   03/24/24 1130  ceFAZolin  (ANCEF ) IVPB 2g/100 mL premix        2 g 200 mL/hr over 30 Minutes Intravenous On call to O.R. 03/24/24 1124 03/24/24 1255       Objective: Vitals:   03/27/24 1937 03/28/24 0450  BP: (!) 170/82 (!) 140/58  Pulse: 95 79  Resp: 16 17  Temp: 97.9 F (36.6 C) 98.4 F (36.9 C)  SpO2: 92% 97%    Intake/Output Summary (Last 24 hours) at 03/28/2024 1405 Last data filed at 03/28/2024 0352 Gross per 24 hour  Intake 600 ml  Output 425 ml  Net 175 ml   Filed Weights   03/24/24 1219  Weight: 86.6 kg   Weight change:  Body mass index is 23.87 kg/m.   Physical Exam: General exam: Pleasant, elderly Caucasian male.  Not in distress.  Skin: No rashes, lesions or ulcers. HEENT: Atraumatic, normocephalic, no obvious bleeding Lungs: Clear to auscultation bilaterally,  CVS: S1, S2, no murmur,   GI/Abd: Soft, nontender, nondistended, bowel sound present,   CNS: Alert, awake, oriented x 3 Psychiatry: Mood appropriate Extremities: No pedal edema, no calf tenderness, surgical site  looks intact without bleeding.  Data Review: I have personally reviewed the laboratory data and studies available.  F/u labs ordered Unresulted Labs (From admission, onward)    None       Signed, Chapman Rota, MD [facility] 03/28/2024

## 2024-03-28 NOTE — Progress Notes (Signed)
 Pt asking if family can transport him to rehab. Informed him he would have to speak with TOC in am.

## 2024-03-28 NOTE — Progress Notes (Signed)
 Mobility Specialist Progress Note:    03/28/24 1105  Mobility  Activity Ambulated with assistance  Level of Assistance Contact guard assist, steadying assist  Assistive Device Four wheel walker  Distance Ambulated (ft) 60 ft (+40)  RLE Weight Bearing Per Provider Order WBAT  Activity Response Tolerated well  Mobility Referral Yes  Mobility visit 1 Mobility  Mobility Specialist Start Time (ACUTE ONLY) 1042  Mobility Specialist Stop Time (ACUTE ONLY) 1055  Mobility Specialist Time Calculation (min) (ACUTE ONLY) 13 min   Pt received standing at EOB w/ family member, agreeable to mobility. No physical assistance required, contact guard for safety. Took x1 standing rest break d/t fatigue and pain in RLE, otherwise tolerated well. Returned to room w/o fault. Left in chair w/ call bell and personal belongings in reach. All needs met. Family in room.  Thersia Minder Mobility Specialist  Please contact vis Secure Chat or  Rehab Office [phone]

## 2024-03-28 NOTE — Plan of Care (Signed)
  Problem: Education: Goal: Knowledge of General Education information will improve Description: Including pain rating scale, medication(s)/side effects and non-pharmacologic comfort measures Outcome: Progressing   Problem: Health Behavior/Discharge Planning: Goal: Ability to manage health-related needs will improve Outcome: Progressing   Problem: Activity: Goal: Risk for activity intolerance will decrease Outcome: Progressing   Problem: Nutrition: Goal: Adequate nutrition will be maintained Outcome: Progressing   Problem: Coping: Goal: Level of anxiety will decrease Outcome: Progressing   Problem: Elimination: Goal: Will not experience complications related to bowel motility Outcome: Progressing Goal: Will not experience complications related to urinary retention Outcome: Progressing   

## 2024-03-29 DIAGNOSIS — S72144D Nondisplaced intertrochanteric fracture of right femur, subsequent encounter for closed fracture with routine healing: Secondary | ICD-10-CM | POA: Diagnosis not present

## 2024-03-29 NOTE — Discharge Summary (Signed)
 Physician Discharge Summary  Daniel [name] FMW:[id number] DOB: 1933-01-02 DOA: 03/23/2024  PCP: Shepard Ade, MD  Admit date: 03/23/2024 Discharge date: 03/29/2024  Admitted From: home Disposition:  SNF  Recommendations for Outpatient Follow-up:  Follow up with PCP in 1-2 weeks Follow up with orthopedic surgery in 1-2 weeks  Home Health: none Equipment/Devices: none  Discharge Condition: stable CODE STATUS: Full code Diet Orders (From admission, onward)     Start     Ordered   03/24/24 1447  Diet regular Room service appropriate? Yes; Fluid consistency: Thin  Diet effective now       Question Answer Comment  Room service appropriate? Yes   Fluid consistency: Thin      03/24/24 1446            HPI: Daniel [name] is a 89 y.o. male with PMH significant for HTN, HLD, CAD (s/p CABG in 1997; PCI to RCA in 11/2006; PCI to OM in 04/2022), cervical spinal stenosis, 10/17, patient was brought to the ED after ground-level fall. Per report, patient fell in the garage after tripping over the lawnmower.  He sustained immediate right hip pain and was unable to get up or put any weight on it.  Also sustained an injury to the arm, with skin being peeled back from hitting the sweeper. Neighbor called EMS and patient was brought to the ED   Hospital Course / Discharge diagnoses: Principal problem Acute nondisplaced comminuted fracture of the right greater trochanter -patient was admitted to the hospital, orthopedic surgery was consulted and he is now p/p ORIF -10/18 Dr. Vernetta.  Did well postoperatively, able to work with PT and SNF was recommended  Active problems CKD 3 A -baseline creatinine around 1.4, currently at baseline Acute blood loss anemia - Noted drop in hemoglobin due to fracture and surgery.  Hemoglobin overall stable Thrombocytopenia - Platelets have now normalized H/o CAD, s/p CABG in 1997; PCI to RCA in 11/2006; PCI to OM in 04/2022 - PTA meds- Plavix  75mg   daily HTN -continue home medications BPH - Continue Flomax  Impaired mobility  H/o cervical spinal stenosis - Seen by PT.  SNF recommended  Sepsis ruled out  Discharge Instructions   Allergies as of 03/29/2024       Reactions   Penicillins Swelling, Other (See Comments)   Swelling and redness in joints Has patient had a PCN reaction causing immediate rash, facial/tongue/throat swelling, SOB or lightheadedness with hypotension: No Has patient had a PCN reaction causing severe rash involving mucus membranes or skin necrosis: No Has patient had a PCN reaction that required hospitalization: No Has patient had a PCN reaction occurring within the last 10 years: No If all of the above answers are NO, then may proceed with Cephalosporin use.   Statins Other (See Comments)   Myalgia and leg cramps INTOLERANCE AT HIGH DOSES   Sulfa Antibiotics Other (See Comments)   Pain in left side underneath ribcage-pancreas   Duloxetine Hcl Other (See Comments)   Dizziness        Medication List     STOP taking these medications    clindamycin  300 MG capsule Commonly known as: CLEOCIN        TAKE these medications    acetaminophen  500 MG tablet Commonly known as: TYLENOL  Take 1,000 mg by mouth every 8 (eight) hours as needed for mild pain.   alum & mag hydroxide-simeth 200-200-20 MG/5ML suspension Commonly known as: MAALOX/MYLANTA Take 30 mLs by mouth every 4 (four) hours as  needed for indigestion.   azelastine  0.1 % nasal spray Commonly known as: ASTELIN  Place 1-2 sprays into both nostrils 2 (two) times daily. What changed:  when to take this reasons to take this   cholecalciferol  1000 units tablet Commonly known as: VITAMIN D  Take 1,000 Units by mouth daily.   clopidogrel  75 MG tablet Commonly known as: PLAVIX  TAKE 1 TABLET(75 MG) BY MOUTH DAILY   cyanocobalamin  1000 MCG tablet Commonly known as: VITAMIN B12 Take 1,000 mcg by mouth 2 (two) times daily.   docusate  sodium 100 MG capsule Commonly known as: COLACE Take 1 capsule (100 mg total) by mouth 2 (two) times daily.   feeding supplement Liqd Take 237 mLs by mouth 2 (two) times daily between meals.   loratadine 10 MG tablet Commonly known as: CLARITIN Take 10 mg by mouth daily as needed for allergies or rhinitis.   methocarbamol  500 MG tablet Commonly known as: ROBAXIN  Take 1 tablet (500 mg total) by mouth every 6 (six) hours as needed for muscle spasms.   metoprolol  tartrate 50 MG tablet Commonly known as: LOPRESSOR  Take 25 mg ( 1/2 tablet of 50 mg) in the morning  and 75 mg  ( 1 and 1/2 tablets) at 8 pm in the evening   montelukast  10 MG tablet Commonly known as: SINGULAIR  TAKE 1 TABLET(10 MG) BY MOUTH AT BEDTIME   multivitamin with minerals Tabs tablet Take 1 tablet by mouth daily.   nitroGLYCERIN  0.4 MG SL tablet Commonly known as: NITROSTAT  PLACE 1 TABLET UNDER THE TONGUE EVERY 5 MINUTES AS NEEDED FOR CHEST PAIN FOR 3 DOSES. IF NO RELIEF AFTER FIRST DOSE CALL 911 OR MD   polyethylene glycol 17 g packet Commonly known as: MIRALAX  / GLYCOLAX  Take 17 g by mouth daily as needed for moderate constipation.   tamsulosin  0.4 MG Caps capsule Commonly known as: FLOMAX  Take 0.4 mg by mouth at bedtime.   traMADol  50 MG tablet Commonly known as: ULTRAM  Take 1-2 tablets (50-100 mg total) by mouth every 6 (six) hours as needed for moderate pain (pain score 4-6). What changed:  how much to take when to take this reasons to take this   triamcinolone  55 MCG/ACT Aero nasal inhaler Commonly known as: NASACORT  Place 1 spray into the nose daily. What changed: when to take this   zolpidem  10 MG tablet Commonly known as: AMBIEN  Take 1 tablet (10 mg total) by mouth at bedtime.        Follow-up Information     Vernetta Lonni GRADE, MD. Schedule an appointment as soon as possible for a visit in 2 week(s).   Specialty: Orthopedic Surgery Contact information: [address] [place]   [place] [address] [phone]         Shepard Ade, MD Follow up.   Specialty: Internal Medicine Contact information: [address] [place] [address] [phone]                 Consultations: Orthopedic surgery   Procedures/Studies:  DG FEMUR, MIN 2 VIEWS RIGHT Result Date: 03/24/2024 CLINICAL DATA:  Elective surgery. EXAM: RIGHT FEMUR 2 VIEWS COMPARISON:  Preoperative imaging. FINDINGS: Eight fluoroscopic spot views of the femur submitted from the operating room. Femoral intramedullary nail with trans trochanteric screw fixation of proximal femur fracture. Fracture is not well demonstrated on submitted views. Fluoroscopy time 66.4 seconds. Dose 10.25 mGy. IMPRESSION: Intraoperative fluoroscopy during proximal femur fracture ORIF. Electronically Signed   By: Andrea Gasman M.D.   On: 03/24/2024 16:41  DG C-Arm 1-60 Min-No Report Result Date: 03/24/2024 Fluoroscopy was utilized by the requesting physician.  No radiographic interpretation.   DG CHEST PORT 1 VIEW Result Date: 03/24/2024 CLINICAL DATA:  Recent fall.  Right hip fracture. EXAM: PORTABLE CHEST 1 VIEW COMPARISON:  04/24/2022 FINDINGS: Stable surgical changes from coronary artery bypass surgery. The cardiac silhouette, mediastinal and hilar contours are within normal limits and stable. Stable mild tortuosity and calcification of the thoracic aorta. Stable moderate eventration of the right hemidiaphragm. No acute pulmonary findings. No infiltrates, edema or effusions. No pulmonary lesions. Remote healed left rib fractures are noted. IMPRESSION: No acute cardiopulmonary findings. Electronically Signed   By: MYRTIS Stammer M.D.   On: 03/24/2024 12:37   MR HIP RIGHT WO CONTRAST Result Date: 03/24/2024 CLINICAL DATA:  [place].  Evaluate right hip fracture. EXAM: MR OF THE RIGHT HIP WITHOUT CONTRAST TECHNIQUE: Multiplanar, multisequence MR imaging was performed. No intravenous contrast was administered.  COMPARISON:  Radiographs and CT scan 03/23/2024 FINDINGS: Bones: Both hips are normally located. Moderate age related degenerative changes. Nondisplaced intertrochanteric fracture of the right hip is evident on MRI. No femoral neck fracture. The pubic symphysis and SI joints are intact.  No pelvic fractures. No significant soft tissue injury also involving the right hip area. Grade 2 muscle tear involving the gluteus medius with inflammation/edema and probable hemorrhage in and around the muscle. No tendon rupture. No significant intrapelvic abnormalities are identified. There is a Foley catheter noted in the bladder. Sigmoid colon diverticulosis. IMPRESSION: 1. Nondisplaced intertrochanteric fracture of the right hip. 2. Grade 2 muscle tear involving the gluteus medius. 3. Intact bony pelvis. Electronically Signed   By: MYRTIS Stammer M.D.   On: 03/24/2024 09:26   CT Hip Right Wo Contrast Result Date: 03/23/2024 EXAM: CT OF THE RIGHT HIP WITHOUT IV CONTRAST 03/23/2024 08:10:23 PM TECHNIQUE: CT of the right hip was performed without the administration of intravenous contrast. Multiplanar reformatted images are provided for review. Automated exposure control, iterative reconstruction, and/or weight based adjustment of the mA/kV was utilized to reduce the radiation dose to as low as reasonably achievable. COMPARISON: Same day hip radiograph. CLINICAL HISTORY: Hip trauma, fracture suspected, xray done. Fall with right hip pain. FINDINGS: BONES: Nondisplaced comminuted fracture of the right greater trochanter. No extension into the femoral diaphysis or femoral neck. SOFT TISSUE: No significant soft tissue edema or fluid collections. No soft tissue mass. JOINT: No osseous erosions. INTRAPELVIC CONTENTS: Limited images of the intrapelvic contents are unremarkable. IMPRESSION: 1. Nondisplaced comminuted fracture of the right greater trochanter. Electronically signed by: Norman Gatlin MD 03/23/2024 08:50 PM EDT RP  Workstation: [id number]   DG Knee Complete 4 Views Right Result Date: 03/23/2024 CLINICAL DATA:  fall, right hip pain, unable to bear weight on the right leg EXAM: RIGHT KNEE - COMPLETE 4+ VIEW COMPARISON:  None Available. FINDINGS: Well-aligned knee arthroplasty without acute fracture.No periprosthetic lucency to suggest loosening.No dislocation. Multiple surgical clips in the medial aspect of the calf. No radiopaque foreign body or retained surgical instrument. IMPRESSION: Well-aligned knee arthroplasty without acute fracture or dislocation.No periprosthetic lucency to suggest hardware loosening. Electronically Signed   By: Rogelia Myers M.D.   On: 03/23/2024 19:43   DG Hip Unilat W or Wo Pelvis 2-3 Views Right Result Date: 03/23/2024 CLINICAL DATA:  fall, right hip pain EXAM: DG HIP (WITH OR WITHOUT PELVIS) 2-3V RIGHT COMPARISON:  None Available. FINDINGS: No evidence of pelvic fracture or diastasis.No acute hip fracture or dislocation.Degenerative disc disease of the  lumbar spine. Peripheral vascular atherosclerosis. IMPRESSION: No acute fracture, pelvic bone diastasis, or dislocation. Electronically Signed   By: Rogelia Myers M.D.   On: 03/23/2024 19:41   DG Ankle Complete Right Result Date: 03/23/2024 CLINICAL DATA:  fall EXAM: RIGHT ANKLE - COMPLETE 3+ VIEW COMPARISON:  None Available. FINDINGS: No acute fracture or dislocation. No ankle mortise widening. The talar dome is intact. Advanced midfoot degenerative changes. Surgical clips along the medial calf. IMPRESSION: No acute fracture or dislocation. Electronically Signed   By: Rogelia Myers M.D.   On: 03/23/2024 19:38     Subjective: - no chest pain, shortness of breath, no abdominal pain, nausea or vomiting.   Discharge Exam: BP (!) 163/67 (BP Location: Right Arm)   Pulse 67   Temp 97.7 F (36.5 C)   Resp 18   Ht 6' 3 (1.905 m)   Wt 86.6 kg   SpO2 100%   BMI 23.87 kg/m   General: Pt is alert, awake, not in acute  distress Cardiovascular: RRR, S1/S2 +, no rubs, no gallops Respiratory: CTA bilaterally, no wheezing, no rhonchi Abdominal: Soft, NT, ND, bowel sounds + Extremities: no edema, no cyanosis    The results of significant diagnostics from this hospitalization (including imaging, microbiology, ancillary and laboratory) are listed below for reference.     Microbiology: No results found for this or any previous visit (from the past 240 hours).   Labs: Basic Metabolic Panel: Recent Labs  Lab 03/23/24 1943 03/25/24 0741 03/26/24 0720  NA 140 137 137  K 4.2 4.4 4.5  CL 104 105 107  CO2 25 23 24   GLUCOSE 119* 123* 115*  BUN 27* 27* 32*  CREATININE 1.42* 1.49* 1.48*  CALCIUM  9.3 8.3* 8.3*   Liver Function Tests: No results for input(s): AST, ALT, ALKPHOS, BILITOT, PROT, ALBUMIN in the last 168 hours. CBC: Recent Labs  Lab 03/23/24 1943 03/25/24 0741 03/26/24 0720 03/27/24 1034  WBC 13.8* 12.3* 8.8 6.1  NEUTROABS  --   --  6.3  --   HGB 12.3* 9.7* 8.6* 8.8*  HCT 37.0* 29.4* 26.1* 26.7*  MCV 93.0 93.3 93.5 94.0  PLT 214 145* 134* 179   CBG: Recent Labs  Lab 03/26/24 0904  GLUCAP 97   Hgb A1c No results for input(s): HGBA1C in the last 72 hours. Lipid Profile No results for input(s): CHOL, HDL, LDLCALC, TRIG, CHOLHDL, LDLDIRECT in the last 72 hours. Thyroid  function studies No results for input(s): TSH, T4TOTAL, T3FREE, THYROIDAB in the last 72 hours.  Invalid input(s): FREET3 Urinalysis    Component Value Date/Time   COLORURINE YELLOW 03/24/2024 1115   APPEARANCEUR HAZY (A) 03/24/2024 1115   LABSPEC 1.015 03/24/2024 1115   PHURINE 5.0 03/24/2024 1115   GLUCOSEU NEGATIVE 03/24/2024 1115   HGBUR LARGE (A) 03/24/2024 1115   BILIRUBINUR NEGATIVE 03/24/2024 1115   KETONESUR NEGATIVE 03/24/2024 1115   PROTEINUR 30 (A) 03/24/2024 1115   UROBILINOGEN 0.2 11/12/2008 1956   NITRITE NEGATIVE 03/24/2024 1115   LEUKOCYTESUR MODERATE  (A) 03/24/2024 1115    FURTHER DISCHARGE INSTRUCTIONS:   Get Medicines reviewed and adjusted: Please take all your medications with you for your next visit with your Primary MD   Laboratory/radiological data: Please request your Primary MD to go over all hospital tests and procedure/radiological results at the follow up, please ask your Primary MD to get all Hospital records sent to his/her office.   In some cases, they will be blood work, cultures and biopsy results pending at  the time of your discharge. Please request that your primary care M.D. goes through all the records of your hospital data and follows up on these results.   Also Note the following: If you experience worsening of your admission symptoms, develop shortness of breath, life threatening emergency, suicidal or homicidal thoughts you must seek medical attention immediately by calling 911 or calling your MD immediately  if symptoms less severe.   You must read complete instructions/literature along with all the possible adverse reactions/side effects for all the Medicines you take and that have been prescribed to you. Take any new Medicines after you have completely understood and accpet all the possible adverse reactions/side effects.    Do not drive when taking Pain medications or sleeping medications (Benzodaizepines)   Do not take more than prescribed Pain, Sleep and Anxiety Medications. It is not advisable to combine anxiety,sleep and pain medications without talking with your primary care practitioner   Special Instructions: If you have smoked or chewed Tobacco  in the last 2 yrs please stop smoking, stop any regular Alcohol  and or any Recreational drug use.   Wear Seat belts while driving.   Please note: You were cared for by a hospitalist during your hospital stay. Once you are discharged, your primary care physician will handle any further medical issues. Please note that NO REFILLS for any discharge medications  will be authorized once you are discharged, as it is imperative that you return to your primary care physician (or establish a relationship with a primary care physician if you do not have one) for your post hospital discharge needs so that they can reassess your need for medications and monitor your lab values.  Time coordinating discharge: 35 minutes  SIGNED:  Nilda Fendt, MD, PhD 03/29/2024, 8:31 AM

## 2024-03-29 NOTE — Progress Notes (Signed)
 I attempt to call report to [facility] and was informed receiving RN is on lunch. 5 [place] RN left name and number for receiving nurse to call after she/he returns to the floor.

## 2024-03-29 NOTE — TOC Transition Note (Signed)
 Transition of Care [facility]) - Discharge Note   Patient Details  Name: Daniel [name] MRN: [id number] Date of Birth: 08-Apr-1933  [facility]) CM/SW Contact:  Bridget Cordella Simmonds, LCSW Phone Number: 03/29/2024, 9:49 AM   Clinical Narrative:   Pt discharging to [facility], room 603B.  RN call report to [phone].  Son Dan will transport, will need pt brought down to main north tower entrance with assistance getting into the vehicle.  0900: CSW confirmed with [facility], that they can receive pt today.  CSW confirmed with son Rolan that he is planning to transport pt to SNF.     Final next level of care: Skilled Nursing Facility Barriers to Discharge: Barriers Resolved   Patient Goals and CMS Choice Patient states their goals for this hospitalization and ongoing recovery are:: to get better and go home   Choice offered to / list presented to : Patient      Discharge Placement              Patient chooses bed at: [facility] Patient to be transferred to facility by: son Rolan Name of family member notified: son Rolan in room Patient and family notified of of transfer: 03/29/24  Discharge Plan and Services Additional resources added to the After Visit Summary for                                       Social Drivers of Health (SDOH) Interventions SDOH Screenings   Food Insecurity: No Food Insecurity (03/24/2024)  Housing: Low Risk  (03/24/2024)  Transportation Needs: No Transportation Needs (03/24/2024)  Utilities: Not At Risk (03/24/2024)  Social Connections: Moderately Isolated (03/24/2024)  Tobacco Use: Low Risk  (03/24/2024)     Readmission Risk Interventions     No data to display

## 2024-04-02 ENCOUNTER — Encounter: Payer: Self-pay | Admitting: Orthopaedic Surgery

## 2024-04-02 ENCOUNTER — Ambulatory Visit: Admitting: Orthopaedic Surgery

## 2024-04-02 ENCOUNTER — Other Ambulatory Visit (INDEPENDENT_AMBULATORY_CARE_PROVIDER_SITE_OTHER)

## 2024-04-02 DIAGNOSIS — S72001A Fracture of unspecified part of neck of right femur, initial encounter for closed fracture: Secondary | ICD-10-CM | POA: Diagnosis not present

## 2024-04-02 MED ORDER — HYDROCODONE-ACETAMINOPHEN 5-325 MG PO TABS: 1.0000 | ORAL_TABLET | Freq: Four times a day (QID) | ORAL | 0 refills | Status: AC | PRN

## 2024-04-02 NOTE — Progress Notes (Signed)
 The patient is an 89 year old gentleman who is 9 days out from fixation of a nondisplaced right hip intertrochanteric fracture.  This was after an accidental fall at home when he was working on his lawn more.  He does live alone but his son is with him today.  After surgery he did give for short-term skilled nursing.  He says recently after Friday he has having more problems with putting full weight on his right hip.  I did put his hip through internal ex rotation on the right side and there is some pain and discomfort but it is not severe.  There is no worrisome findings of the incisions himself.  He has no fever and chills.    x-rays of his right hip today including the entire femur show the hardware is intact and the fracture remains completely nondisplaced.  I did go over the preoperative and postoperative x-rays as well as all the other imaging studies.  This gave the patient reassurance that he is doing well.  I want him to still go slow.  His son is with him for now.  He can weight-bear as tolerated.  I will send in some hydrocodone for pain and we will see him back next week for staple removal but no x-rays will be needed.

## 2024-04-06 ENCOUNTER — Other Ambulatory Visit: Payer: Self-pay | Admitting: Orthopaedic Surgery

## 2024-04-09 ENCOUNTER — Ambulatory Visit: Admitting: Physician Assistant

## 2024-04-09 ENCOUNTER — Encounter: Payer: Self-pay | Admitting: Radiology

## 2024-04-09 DIAGNOSIS — S72001A Fracture of unspecified part of neck of right femur, initial encounter for closed fracture: Secondary | ICD-10-CM

## 2024-04-09 NOTE — Progress Notes (Signed)
 HPI: Daniel [name] returns today status post right hip IM rodding 03/24/2024.  He is here mainly for staple removal.  He is taking tramadol  for pain.  He is on chronic anticoagulation.  No shortness of breath fevers chills.  He tolerated some his diet well.  He notes that he has had a bowel movement.  He just feels overall weak.  He is ambulating short distances with a walker but comes in today in a wheelchair.  Review of systems: See HPI otherwise negative  Physical exam: General: Well-developed well-nourished male no acute distress Respiratory: Lungs are clear to auscultation throughout. Right hip: Surgical incisions well-approximated staples no signs infection or dehiscence of either incision.  Right calf supple nontender dorsiflexion plantarflexion right ankle intact.  He is able to stand and transfer from table to chair.  Impression: Status post right hip IM rod 03/24/2024  Plan: Staples removed Steri-Strips applied.  He will follow-up with us  in 1 month sooner if there is any questions concerns.  Encouraged him to work on deep breathing until he begins to ambulate longer distances.

## 2024-04-12 ENCOUNTER — Ambulatory Visit: Admitting: Physical Medicine and Rehabilitation

## 2024-04-18 ENCOUNTER — Other Ambulatory Visit: Payer: Self-pay | Admitting: Orthopaedic Surgery

## 2024-04-30 ENCOUNTER — Other Ambulatory Visit: Payer: Self-pay | Admitting: Cardiology

## 2024-05-07 ENCOUNTER — Other Ambulatory Visit

## 2024-05-07 ENCOUNTER — Encounter: Payer: Self-pay | Admitting: Physician Assistant

## 2024-05-07 ENCOUNTER — Ambulatory Visit: Admitting: Physician Assistant

## 2024-05-07 DIAGNOSIS — S72001A Fracture of unspecified part of neck of right femur, initial encounter for closed fracture: Secondary | ICD-10-CM

## 2024-05-07 NOTE — Progress Notes (Signed)
 HPI: Daniel [name] returns today status post right hip IM nailing 03/24/2024.  He is overall doing well.  He states he is using a walker outside the home and a rollator in the home and sometimes a cane.  He does continue to work with PT.  He is having some minimal pain and soreness takes Tylenol  and tramadol .  Denies any fevers chills.  He states his surgical incisions well-healed.  Review of systems: See HPI  Physical exam: General well-developed well-nourished male no acute distress ambulates with a rolling walker nonantalgic gait.  Right hip good range of motion without pain calf supple nontender.  Radiographs: Multiple views of the right femur show no acute fractures.  Minimal sclerotic activity at the hip fracture site which is barely visible.  No necrosis of the femoral head.  No evidence of hardware failure.  Impression: Status post right hip IM nailing  Plan: He will continue to work with therapy on range of motion and strengthening right hip.  Follow-up with Dr. Vernetta in 4 weeks no radiographs at that time unless clinically indicated.  Questions were encouraged and answered.

## 2024-05-18 ENCOUNTER — Ambulatory Visit: Attending: Cardiology | Admitting: Cardiology

## 2024-05-18 VITALS — BP 130/74 | HR 89 | Ht 75.0 in | Wt 192.0 lb

## 2024-05-18 DIAGNOSIS — E785 Hyperlipidemia, unspecified: Secondary | ICD-10-CM | POA: Diagnosis not present

## 2024-05-18 DIAGNOSIS — R002 Palpitations: Secondary | ICD-10-CM | POA: Diagnosis not present

## 2024-05-18 DIAGNOSIS — I10 Essential (primary) hypertension: Secondary | ICD-10-CM | POA: Diagnosis not present

## 2024-05-18 DIAGNOSIS — I25118 Atherosclerotic heart disease of native coronary artery with other forms of angina pectoris: Secondary | ICD-10-CM

## 2024-05-18 DIAGNOSIS — T466X5D Adverse effect of antihyperlipidemic and antiarteriosclerotic drugs, subsequent encounter: Secondary | ICD-10-CM | POA: Diagnosis not present

## 2024-05-18 DIAGNOSIS — M791 Myalgia, unspecified site: Secondary | ICD-10-CM | POA: Diagnosis not present

## 2024-05-18 NOTE — Patient Instructions (Signed)
Medication Instructions:   No changes *If you need a refill on your cardiac medications before your next appointment, please call your pharmacy*   Lab Work:  Not needed   Testing/Procedures: Not needed   Follow-Up: At [facility], you and your health needs are our priority.  As part of our continuing mission to provide you with exceptional heart care, we have created designated Provider Care Teams.  These Care Teams include your primary Cardiologist (physician) and Advanced Practice Providers (APPs -  Physician Assistants and Nurse Practitioners) who all work together to provide you with the care you need, when you need it.     Your next appointment:   6 month(s)  The format for your next appointment:   In Person  Provider:   David Harding, MD    

## 2024-05-18 NOTE — Progress Notes (Signed)
 Cardiology Office Note:  .   Date:  05/22/2024  ID:  Daniel [name], DOB [birth year], MRN [id number] PCP: Shepard Ade, MD  [facility] Providers Cardiologist:  Alm Clay, MD     Chief Complaint  Patient presents with   Follow-up    Palpitations better.   Coronary Artery Disease    No angina    Patient Profile: .     Daniel [name] is a  89 y.o. male with a PMH notable for longstanding cardiac history notable who presents here for 66-month/hospital follow-up at the request of Shepard Ade, MD.  Past Medical History significant for: CAD. CABG x 3 1997: LIMA to LAD, SVG to OM, SVG to diagonal. LHC 11/04/2004 (angina): Significant LM and three-vessel CAD.  Patent but small LIMA to LAD.  Patent SVG to diagonal and SVG to OM. LHC 11/14/2006 (angina): LM 40%.  Mid LAD 80%.  First OM 99%.  New lesions mid posterior descending 85 and 90%.  LIMA to LAD with 60% distal anastomotic site stenosis.  Patent SVG to diagonal with competitive flow between vein graft to diagonal and i LIMA to LAD.  Patent SVG to OM1.  PCI with BMS 2.25 x 28 mm to mid posterior descending. LHC 04/26/2022 (unstable angina): Two-vessel occlusive CAD.  Continued patency of stent in the PDA.  Patent LIMA to LAD.  Patent SVG to D2.  Patent SVG to OM 2.  Critical stenosis mid SVG.  Successful PCI with DES to SVG to OM 2. Echo 04/26/2022: EF 60 to 65%.  Mild concentric LVH.  Grade I DD.  Mild LAE.  Trivial MR. Labile hypertension. Orthostatic hypotension. Hyperlipidemia.-Statin intolerant.  On Repatha  and Zetia  Lipid panel 06/09/2022: LDL 113, HDL 33, TG 261, total 191. Palpitations.    Daniel [name] was last seen on February 21, 2024 as a 73-month follow-up for palpitations and irregular heartbeats.  Noticed episodes lasting 45 minutes or hour which heart rate would go up to the 100s.  No chest pain or pressure.  Some exertional dyspnea.  We decided to stay off of Repatha  and other lipid medicines other than  Zetia ..  I changed his Lopressor  to take 20 5 in the morning and 70 5 in the evening.  I did not really want to titrate these further because of his orthostatic hypotension.  While recovering from his back injury he fell in his garage while working on his lawnmower, he tripped over it and landed on his right hip leading to comminuted fracture of the right greater trochanter.  Was taken for ORIF by Dr. Vernetta.  He is now working on rehabbing.  Subjective  Discussed the use of AI scribe software for clinical note transcription with the patient, who gave verbal consent to proceed.  History of Present Illness Daniel [name] is an 89 year old male with coronary artery disease who presents for follow-up on his cardiac and orthopedic status.  He underwent an open reduction and internal fixation (ORIF) of his femur after a fracture in October. He experiences persistent pain in the area, which may take up to three months for inflammation to subside. He also had a muscle tear associated with the fracture. Recent follow-up X-rays showed healing progress.  He has a history of coronary artery disease with previous stent placements in the vein graft and posterior descending artery. No chest pain or significant palpitations, although he occasionally takes an extra half dose of metoprolol . His current regimen includes 25 mg of  metoprolol  in the morning and 75 mg in the evening. He has not experienced any heart attacks.  He experiences shortness of breath with exertion, which has not changed significantly. He has not been very active due to his recent hip surgery.  He is on Plavix  and metoprolol , and he has not restarted Repatha . His last cholesterol check showed an LDL of 77 and a total cholesterol of 150, without any lipid-lowering medications.  He uses Robaxin  for leg pain, particularly at night, and reports no adverse effects such as dizziness or balance issues. He wakes up three to four times a night  to urinate, using a rotator for stability.  He has a history of right knee surgery several years ago and experiences pain in his left knee, which he feels is too late to replace. He uses a cane occasionally but finds it shaky for longer distances.     Objective   Cardiovascular Medications: Plavix  75 mg daily, metoprolol  tartrate (Lopressor ) 25 mg in the a.m. and 75 mg p.m.  Studies Reviewed: SABRA       Labs from PCP dated 02/20/2024: TC 156, TG 227, HDL 34, LDL 77; A1c 5.9; WBC 5.0, Hgb 12.3, PLT 229; Na+ 142, K+ 4.6.  BUN 34/ Cr 1.4.  Gluc 104   Lab Results  Component Value Date   CHOL 191 06/09/2022   HDL 33 (L) 06/09/2022   LDLCALC 113 (H) 06/09/2022   TRIG 261 (H) 06/09/2022   CHOLHDL 5.8 (H) 06/09/2022   Lab Results  Component Value Date   NA 137 03/26/2024   K 4.5 03/26/2024   CREATININE 1.48 (H) 03/26/2024   GFRNONAA 45 (L) 03/26/2024   GLUCOSE 115 (H) 03/26/2024   Lab Results  Component Value Date   HGBA1C 5.6 04/27/2022    Echocardiogram 04/26/2022: Ejection fraction 60-65%; GR 1 DD,; normal aortic and mitral valves. Normal RV size and function. Normal RAP and RVP.  CATH: 04/26/2022; 2 vessel occlusive CAD. Continued patency of stent in the PDA. Patent LIMA to the LAD (previously described as atretic); Patent SVG to second diagonal; Patent SVG to OM2 but with critical stenosis in the mid SVG; Mildly elevated LVEDP 22 mm Hg; Successful PCI of SVG to OM2 with DES. (SYNERGY XD 4.0X16)Transient no reflow that resolved with IC verapamil .   Risk Assessment/Calculations:         Physical Exam:   VS:  BP 130/74 (BP Location: Right Arm, Patient Position: Sitting, Cuff Size: Normal)   Pulse 89   Ht 6' 3 (1.905 m)   Wt 192 lb (87.1 kg)   SpO2 97%   BMI 24.00 kg/m    Wt Readings from Last 3 Encounters:  05/18/24 192 lb (87.1 kg)  03/24/24 191 lb (86.6 kg)  02/21/24 186 lb (84.4 kg)    GEN: Well nourished, well groomed; in no acute distress; healthy-appearing and  appears younger than stated age. NECK: No JVD; No carotid bruits CARDIAC: Normal S1, S2; RRR, no murmurs, rubs, gallops RESPIRATORY:  Clear to auscultation without rales, wheezing or rhonchi ; nonlabored, good air movement. ABDOMEN: Soft, non-tender, non-distended EXTREMITIES:  No edema; No deformity    ASSESSMENT AND PLAN: .    Problem List Items Addressed This Visit       Cardiology Problems   Hyperlipidemia with target low density lipoprotein (LDL) cholesterol less than 55 mg/dL (Chronic)   Statin intolerance due to myalgias, also not interested in Repatha  or Zetia .  Based on shared decision making, he is fine  not following lipids and not taking medications.      Labile essential hypertension (Chronic)   Allowing for some permissive hypertension, we are only using Lopressor  for blood pressure and palpitations. - No longer on losartan  or diuretic due to orthostatic hypotension. - Continue metoprolol  tartrate 50 mg tablets-1/2 tablet in the morning (25 mg), 1-1/2 tablet in the evening (75 mg)      LM-CAD:  LIMA-LAD, patent SVG-D2 & SVG-OM2 (DES PCI) , BMS PCI rPDA - Primary (Chronic)   Coronary artery disease with prior bypass grafts and stents. No recent chest pain or myocardial infarction. Continues on Plavix  and metoprolol . LDL 77, total cholesterol 849 in September without lipid-lowering medications. - Continue Plavix  75 mg and metoprolol  to tartrate as described.  -- Okay to hold Plavix  5 to 7 days preop or surgeries or procedures. Per previous discussions, not interested in treating lipids, permissive hypertension with no aggressive BP management.        Other   Heart palpitations (Chronic)   Well-controlled with metoprolol  with the adjusted regimen of 25 mg in the morning and 75 mg in the evening. Occasionally requires extra half dose. - Continue current metoprolol  regimen with occasional extra half dose (25 mg) as needed.      Myalgia due to statin (Chronic)    Intolerant of multiple statins as well as Zetia  and Repatha .  See statement above.  Not interested in trying other medications.       Noncardiac assessment and Plan  Right femur fracture, post-ORIF Status post ORIF with rod and screws. Healing well. Muscle tear noted. X-ray shows good healing progress. - Continue current pain management regimen.  Osteoarthritis of bilateral knees, status post right knee replacement Osteoarthritis with right knee replacement. Left knee symptomatic, not amenable to surgery. Managed with Robaxin  at night. - Continue Robaxin  at night for knee pain management.      Follow-Up: Return in about 6 months (around 11/16/2024) for 6 month follow-up with me, [employer].      Signed, Alm MICAEL Clay, MD, MS Alm Clay, M.D., M.S. Interventional Cardiologist  [facility] Pager # [phone]

## 2024-05-22 ENCOUNTER — Encounter: Payer: Self-pay | Admitting: Cardiology

## 2024-05-22 NOTE — Assessment & Plan Note (Signed)
 Statin intolerance due to myalgias, also not interested in Repatha  or Zetia .  Based on shared decision making, he is fine not following lipids and not taking medications.

## 2024-05-22 NOTE — Assessment & Plan Note (Signed)
 Well-controlled with metoprolol  with the adjusted regimen of 25 mg in the morning and 75 mg in the evening. Occasionally requires extra half dose. - Continue current metoprolol  regimen with occasional extra half dose (25 mg) as needed.

## 2024-05-22 NOTE — Assessment & Plan Note (Signed)
 Coronary artery disease with prior bypass grafts and stents. No recent chest pain or myocardial infarction. Continues on Plavix  and metoprolol . LDL 77, total cholesterol 849 in September without lipid-lowering medications. - Continue Plavix  75 mg and metoprolol  to tartrate as described.  -- Okay to hold Plavix  5 to 7 days preop or surgeries or procedures. Per previous discussions, not interested in treating lipids, permissive hypertension with no aggressive BP management.

## 2024-05-22 NOTE — Assessment & Plan Note (Signed)
 Intolerant of multiple statins as well as Zetia  and Repatha .  See statement above.  Not interested in trying other medications.

## 2024-05-22 NOTE — Assessment & Plan Note (Signed)
 Allowing for some permissive hypertension, we are only using Lopressor  for blood pressure and palpitations. - No longer on losartan  or diuretic due to orthostatic hypotension. - Continue metoprolol  tartrate 50 mg tablets-1/2 tablet in the morning (25 mg), 1-1/2 tablet in the evening (75 mg)

## 2024-05-24 ENCOUNTER — Encounter: Payer: Self-pay | Admitting: Orthopaedic Surgery

## 2024-05-24 ENCOUNTER — Ambulatory Visit: Admitting: Orthopaedic Surgery

## 2024-05-24 ENCOUNTER — Other Ambulatory Visit: Payer: Self-pay

## 2024-05-24 DIAGNOSIS — S72001A Fracture of unspecified part of neck of right femur, initial encounter for closed fracture: Secondary | ICD-10-CM | POA: Diagnosis not present

## 2024-05-24 MED ORDER — METHOCARBAMOL 500 MG PO TABS: 500.0000 mg | ORAL_TABLET | Freq: Three times a day (TID) | ORAL | 0 refills | Status: AC | PRN

## 2024-05-24 NOTE — Progress Notes (Signed)
 Daniel [name] is an 89 year old active gentleman who is now 2 months status post open reduction/internal fixation of a displaced right hip intertrochanteric fracture.  He is concerned about his mobility and some worsening pain in his groin recently.  He has had more pain with weightbearing as well.  He is ambulating with a walker right now.  We felt it was reasonable to bring him back in the office for repeat x-rays today.  His previous x-rays showed excellent alignment of the fracture with no hardware issues.  We will repeat x-rays today.  He reports that he has been feeling some worsening pain in his groin over the last week.  He does take some muscle relaxant at night.  Again he is ambulate with a walker.  On exam I can put his right hip through range of motion and there is some pain in the groin but there is no blocks to rotation.  X-rays of the pelvis and right hip show the hardware is intact and the fracture line is not visible anymore from the trochanteric fracture.  I see no complicating features in the femoral head.  He did let me know that years ago he had fluid taken off of his hip under imaging.  He has an appointment with us  on December 29.  Up we will have him keep that appointment but no x-rays are needed.  We may consider sending him to Dr. Burnetta for an ultrasound assessment of his right hip joint to look for any fluid collections and to consider an aspiration and a steroid injection if he continues to have pain.  I did refill his muscle relaxant and encouraged him to try this during the day as well as decreased activities to rest the hip.

## 2024-06-04 ENCOUNTER — Encounter: Payer: Self-pay | Admitting: Orthopaedic Surgery

## 2024-06-04 ENCOUNTER — Other Ambulatory Visit: Payer: Self-pay

## 2024-06-04 ENCOUNTER — Ambulatory Visit: Admitting: Orthopaedic Surgery

## 2024-06-04 DIAGNOSIS — M25562 Pain in left knee: Secondary | ICD-10-CM | POA: Diagnosis not present

## 2024-06-04 DIAGNOSIS — M25561 Pain in right knee: Secondary | ICD-10-CM

## 2024-06-04 DIAGNOSIS — S72001A Fracture of unspecified part of neck of right femur, initial encounter for closed fracture: Secondary | ICD-10-CM

## 2024-06-04 DIAGNOSIS — M1712 Unilateral primary osteoarthritis, left knee: Secondary | ICD-10-CM

## 2024-06-04 MED ORDER — METHYLPREDNISOLONE ACETATE 40 MG/ML IJ SUSP
40.0000 mg | INTRAMUSCULAR | Status: AC | PRN
  Administered 2024-06-04: 40 mg via INTRA_ARTICULAR

## 2024-06-04 MED ORDER — LIDOCAINE HCL 1 % IJ SOLN
3.0000 mL | INTRAMUSCULAR | Status: AC | PRN
  Administered 2024-06-04: 3 mL

## 2024-06-04 NOTE — Progress Notes (Signed)
 HPI: Mr. Schupp returns today status post right hip open reduction/internal fixation of distal placed right hip intertrochanteric fracture.  03/25/2024.  He states overall his hip pain is improving.  He states he is only having pain in the hip when he puts on his socks.  He feels he is much better than he was at last visit with Dr. Vernetta.  Main complaint today though is right knee pain and left knee pain.  He states that his right knee which she had replaced by Dr. Rubie in 2021 was doing well until this injury probably in his hip.  He also notes that he has left knee pain has been ongoing but is getting worse but he does not want any type of surgical intervention.  Reports that he has had viscosupplementation injections in the left knee in the past that have been helpful.  But he also relates that he had other viscosupplementation injections besides Synvisc and had adverse reactions to them.  He does state that the Synvisc he tolerates well.  Review of systems: See HPI otherwise negative  Physical exam: General Well-developed well-nourished male in no acute distress ambulates with a cane with a slow antalgic gait. Right hip good range of motion without pain.  Nontender over the trochanteric region. Bilateral knees: No instability valgus varus stressing of either knee.  No abnormal warmth erythema or effusion of either knee.  Right knee well-healed surgical incision.  Anterior drawer reveals laxity right knee only.  Good range of motion of both knees.  Radiographs: Left knee 2 views: Knee is well located.  Bone-on-bone medial compartment.  Lateral compartment minimal changes.  Moderate patellofemoral arthritis changes.  No acute fractures or acute findings.  Impression: Status post open reduction internal fixation right hip intertrochanteric fracture Right knee pain history of total knee arthroplasty 2021 Left knee arthritis  Plan: Recommend physical therapy for both knees to work on quad  strengthening.  Continue to work on range of motion and strengthening right hip.  Offered him cortisone injection for his left knee today he was agreeable.  He denied any fevers or chills. Will work on him trying to obtain viscosupplementation injection for the left knee.  He has had viscosupplementation injections in the past which have helped.  He does have significant arthritis of the left knee.  No upcoming surgeries on the left knee in the next 6 months.  Questions were encouraged and answered at length.    Procedure Note  Patient: RAMONE GANDER             Date of Birth: Sep 04, 1932           MRN: [id number]             Visit Date: 06/04/2024  Procedures: Visit Diagnoses:  1. Acute pain of left knee     Large Joint Inj: L knee on 06/04/2024 4:43 PM Indications: pain Details: 22 G 1.5 in needle, anterolateral approach  Arthrogram: No  Medications: 3 mL lidocaine  1 %; 40 mg methylPREDNISolone  acetate 40 MG/ML Outcome: tolerated well, no immediate complications Procedure, treatment alternatives, risks and benefits explained, specific risks discussed. Consent was given by the patient. Immediately prior to procedure a time out was called to verify the correct patient, procedure, equipment, support staff and site/side marked as required. Patient was prepped and draped in the usual sterile fashion.
